# Patient Record
Sex: Female | Born: 1975
Health system: Southern US, Community
[De-identification: ages and names within clinical notes are randomized; demographics above are authoritative.]

## PROBLEM LIST (undated history)

## (undated) ENCOUNTER — Emergency Department (HOSPITAL_COMMUNITY): Admission: EM | Payer: 59 | Source: Home / Self Care

## (undated) DIAGNOSIS — F419 Anxiety disorder, unspecified: Secondary | ICD-10-CM

## (undated) DIAGNOSIS — F1910 Other psychoactive substance abuse, uncomplicated: Secondary | ICD-10-CM

## (undated) DIAGNOSIS — E119 Type 2 diabetes mellitus without complications: Secondary | ICD-10-CM

## (undated) DIAGNOSIS — M1990 Unspecified osteoarthritis, unspecified site: Secondary | ICD-10-CM

## (undated) DIAGNOSIS — K219 Gastro-esophageal reflux disease without esophagitis: Secondary | ICD-10-CM

## (undated) DIAGNOSIS — G8929 Other chronic pain: Secondary | ICD-10-CM

## (undated) DIAGNOSIS — R0600 Dyspnea, unspecified: Secondary | ICD-10-CM

## (undated) DIAGNOSIS — M545 Low back pain, unspecified: Secondary | ICD-10-CM

## (undated) DIAGNOSIS — D649 Anemia, unspecified: Secondary | ICD-10-CM

## (undated) DIAGNOSIS — J45909 Unspecified asthma, uncomplicated: Secondary | ICD-10-CM

## (undated) DIAGNOSIS — F32A Depression, unspecified: Secondary | ICD-10-CM

## (undated) DIAGNOSIS — E785 Hyperlipidemia, unspecified: Secondary | ICD-10-CM

## (undated) DIAGNOSIS — H409 Unspecified glaucoma: Secondary | ICD-10-CM

## (undated) DIAGNOSIS — G932 Benign intracranial hypertension: Secondary | ICD-10-CM

## (undated) DIAGNOSIS — F329 Major depressive disorder, single episode, unspecified: Secondary | ICD-10-CM

## (undated) DIAGNOSIS — T7840XA Allergy, unspecified, initial encounter: Secondary | ICD-10-CM

## (undated) DIAGNOSIS — I10 Essential (primary) hypertension: Secondary | ICD-10-CM

## (undated) HISTORY — DX: Unspecified glaucoma: H40.9

## (undated) HISTORY — PX: DERMOID CYST  EXCISION: SHX1452

## (undated) HISTORY — DX: Unspecified osteoarthritis, unspecified site: M19.90

## (undated) HISTORY — DX: Allergy, unspecified, initial encounter: T78.40XA

## (undated) HISTORY — DX: Other psychoactive substance abuse, uncomplicated: F19.10

## (undated) HISTORY — DX: Depression, unspecified: F32.A

## (undated) HISTORY — DX: Anxiety disorder, unspecified: F41.9

## (undated) HISTORY — DX: Gastro-esophageal reflux disease without esophagitis: K21.9

## (undated) HISTORY — DX: Unspecified asthma, uncomplicated: J45.909

---
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
## 2011-04-30 DIAGNOSIS — G8929 Other chronic pain: Secondary | ICD-10-CM | POA: Insufficient documentation

## 2011-04-30 DIAGNOSIS — F3289 Other specified depressive episodes: Secondary | ICD-10-CM | POA: Insufficient documentation

## 2011-04-30 DIAGNOSIS — B373 Candidiasis of vulva and vagina: Secondary | ICD-10-CM | POA: Insufficient documentation

## 2011-04-30 DIAGNOSIS — F329 Major depressive disorder, single episode, unspecified: Secondary | ICD-10-CM | POA: Insufficient documentation

## 2011-04-30 DIAGNOSIS — H6690 Otitis media, unspecified, unspecified ear: Secondary | ICD-10-CM | POA: Insufficient documentation

## 2011-04-30 DIAGNOSIS — B3731 Acute candidiasis of vulva and vagina: Secondary | ICD-10-CM | POA: Insufficient documentation

## 2011-04-30 DIAGNOSIS — M25511 Pain in right shoulder: Secondary | ICD-10-CM

## 2011-05-04 DIAGNOSIS — F1210 Cannabis abuse, uncomplicated: Secondary | ICD-10-CM | POA: Insufficient documentation

## 2011-05-04 DIAGNOSIS — F1410 Cocaine abuse, uncomplicated: Secondary | ICD-10-CM | POA: Insufficient documentation

## 2011-05-04 DIAGNOSIS — F1920 Other psychoactive substance dependence, uncomplicated: Secondary | ICD-10-CM | POA: Insufficient documentation

## 2011-09-20 DIAGNOSIS — F1310 Sedative, hypnotic or anxiolytic abuse, uncomplicated: Secondary | ICD-10-CM | POA: Insufficient documentation

## 2014-05-15 DIAGNOSIS — F1994 Other psychoactive substance use, unspecified with psychoactive substance-induced mood disorder: Secondary | ICD-10-CM | POA: Insufficient documentation

## 2014-05-15 DIAGNOSIS — F17210 Nicotine dependence, cigarettes, uncomplicated: Secondary | ICD-10-CM | POA: Insufficient documentation

## 2014-05-15 DIAGNOSIS — F6089 Other specific personality disorders: Secondary | ICD-10-CM | POA: Insufficient documentation

## 2015-09-13 DIAGNOSIS — F32A Depression, unspecified: Secondary | ICD-10-CM | POA: Insufficient documentation

## 2015-09-13 DIAGNOSIS — F329 Major depressive disorder, single episode, unspecified: Secondary | ICD-10-CM | POA: Insufficient documentation

## 2015-09-16 DIAGNOSIS — F1920 Other psychoactive substance dependence, uncomplicated: Secondary | ICD-10-CM | POA: Insufficient documentation

## 2016-08-26 ENCOUNTER — Emergency Department (HOSPITAL_COMMUNITY): Payer: Self-pay

## 2016-08-26 ENCOUNTER — Encounter (HOSPITAL_COMMUNITY): Payer: Self-pay | Admitting: Emergency Medicine

## 2016-08-26 ENCOUNTER — Emergency Department (HOSPITAL_COMMUNITY)
Admission: EM | Admit: 2016-08-26 | Discharge: 2016-08-26 | Disposition: A | Discharge status: Home / Self Care | Payer: Self-pay | Attending: Emergency Medicine | Admitting: Emergency Medicine

## 2016-08-26 DIAGNOSIS — N390 Urinary tract infection, site not specified: Secondary | ICD-10-CM | POA: Insufficient documentation

## 2016-08-26 DIAGNOSIS — I10 Essential (primary) hypertension: Secondary | ICD-10-CM | POA: Insufficient documentation

## 2016-08-26 DIAGNOSIS — J069 Acute upper respiratory infection, unspecified: Secondary | ICD-10-CM

## 2016-08-26 DIAGNOSIS — F17210 Nicotine dependence, cigarettes, uncomplicated: Secondary | ICD-10-CM | POA: Insufficient documentation

## 2016-08-26 HISTORY — DX: Hyperlipidemia, unspecified: E78.5

## 2016-08-26 HISTORY — DX: Other chronic pain: G89.29

## 2016-08-26 HISTORY — DX: Low back pain: M54.5

## 2016-08-26 HISTORY — DX: Essential (primary) hypertension: I10

## 2016-08-26 HISTORY — DX: Low back pain, unspecified: M54.50

## 2016-08-26 LAB — CBC
HCT: 39.7 % (ref 36.0–46.0)
Hemoglobin: 12.9 g/dL (ref 12.0–15.0)
MCH: 30.4 pg (ref 26.0–34.0)
MCHC: 32.5 g/dL (ref 30.0–36.0)
MCV: 93.6 fL (ref 78.0–100.0)
Platelets: 245 10*3/uL (ref 150–400)
RBC: 4.24 MIL/uL (ref 3.87–5.11)
RDW: 12.4 % (ref 11.5–15.5)
WBC: 10.3 10*3/uL (ref 4.0–10.5)

## 2016-08-26 LAB — BASIC METABOLIC PANEL
Anion gap: 7 (ref 5–15)
BUN: 15 mg/dL (ref 6–20)
CO2: 24 mmol/L (ref 22–32)
Calcium: 8.8 mg/dL — ABNORMAL LOW (ref 8.9–10.3)
Chloride: 109 mmol/L (ref 101–111)
Creatinine, Ser: 0.77 mg/dL (ref 0.44–1.00)
GFR calc Af Amer: >60 mL/min (ref >60)
GFR calc non Af Amer: >60 mL/min (ref >60)
Glucose, Bld: 110 mg/dL — ABNORMAL HIGH (ref 65–99)
Potassium: 3.5 mmol/L (ref 3.5–5.1)
Sodium: 140 mmol/L (ref 135–145)

## 2016-08-26 LAB — I-STAT TROPONIN, ED: Troponin i, poc: 0 ng/mL (ref 0.00–0.08)

## 2016-08-26 MED ORDER — ALBUTEROL SULFATE HFA 108 (90 BASE) MCG/ACT IN AERS
1.0000 | INHALATION_SPRAY | Freq: Once | RESPIRATORY_TRACT | Status: AC
  Administered 2016-08-26: 1 via RESPIRATORY_TRACT
  Filled 2016-08-26: qty 6.7

## 2016-08-26 MED ORDER — AMLODIPINE BESYLATE 10 MG PO TABS: 10.0000 mg | ORAL_TABLET | Freq: Every day | ORAL | 0 refills | Status: DC

## 2016-08-26 MED ORDER — FLUCONAZOLE 150 MG PO TABS
150.0000 mg | ORAL_TABLET | Freq: Once | ORAL | Status: AC
  Administered 2016-08-26: 150 mg via ORAL
  Filled 2016-08-26: qty 1

## 2016-08-26 MED ORDER — METRONIDAZOLE 500 MG PO TABS
2000.0000 mg | ORAL_TABLET | Freq: Once | ORAL | Status: AC
  Administered 2016-08-26: 2000 mg via ORAL
  Filled 2016-08-26: qty 4

## 2016-08-26 NOTE — ED Triage Notes (Signed)
As Probation officer was walking out door, pt states she wants to know if they will check her itching "down there" pt states she has had itching since Tuesday.

## 2016-08-26 NOTE — ED Triage Notes (Signed)
Delay in documenting triage related to obtaining EKG.   Pt complaint of intermittent left sided chest sharpness, SOB, and nasal congestion since Thursday. Pt denies cough.

## 2016-08-26 NOTE — ED Provider Notes (Signed)
[facility] DEPT Provider Note   CSN: [id number] Arrival date & time: 08/26/16  1450     History   Chief Complaint Chief Complaint  Patient presents with  . Chest Pain  . Shortness of Breath  . Nasal Congestion    HPI Leah Barber is a 41 y.o. female.  The history is provided by the patient. No language interpreter was used.  Chest Pain   Associated symptoms include shortness of breath.  Shortness of Breath  Associated symptoms include chest pain.    Leah Barber is a 41 y.o. female who presents to the Emergency Department complaining of cough, nasal congestion.  She presents for evaluation of 10 days of nasal congestion and drainage with associated cough. She has occasional chest discomfort with coughing and shortness of breath. No fevers, vomiting, diarrhea. She also states she she would like refills of her blood pressure medications and treatment for bacterial vaginosis. No dysuria. She recently moved to the area and does not have any of her prescription medications.  Past Medical History:  Diagnosis Date  . Chronic lower back pain   . Hyperlipemia   . Hypertension     There are no active problems to display for this patient.   History reviewed. No pertinent surgical history.  OB History    No data available       Home Medications    Prior to Admission medications   Medication Sig Start Date End Date Taking? Authorizing Provider  amLODipine (NORVASC) 10 MG tablet Take 1 tablet (10 mg total) by mouth daily. 08/26/16   Quintella Reichert, MD    Family History No family history on file.  Social History Social History  Substance Use Topics  . Smoking status: Current Every Day Smoker    Packs/day: 1.00    Types: Cigarettes  . Smokeless tobacco: Not on file  . Alcohol use No     Allergies   Patient has no known allergies.   Review of Systems Review of Systems  Respiratory: Positive for shortness of breath.   Cardiovascular: Positive for chest pain.   All other systems reviewed and are negative.    Physical Exam Updated Vital Signs BP 151/97 (BP Location: Right Arm)   Pulse 88   Temp 98.3 F (36.8 C) (Oral)   Resp 18   Ht 5\' 2"  (1.575 m)   Wt 180 lb (81.6 kg)   LMP 08/22/2016   SpO2 100%   BMI 32.92 kg/m   Physical Exam  Constitutional: She is oriented to person, place, and time. She appears well-developed and well-nourished.  HENT:  Head: Normocephalic and atraumatic.  Right Ear: External ear normal.  Left Ear: External ear normal.  Mouth/Throat: Oropharynx is clear and moist.  Cardiovascular: Normal rate and regular rhythm.   No murmur heard. Pulmonary/Chest: Effort normal and breath sounds normal. No respiratory distress.  Abdominal: Soft. There is no tenderness. There is no rebound and no guarding.  Musculoskeletal: She exhibits no edema or tenderness.  Neurological: She is alert and oriented to person, place, and time.  Skin: Skin is warm and dry.  Psychiatric: She has a normal mood and affect. Her behavior is normal.  Nursing note and vitals reviewed.    ED Treatments / Results  Labs (all labs ordered are listed, but only abnormal results are displayed) Labs Reviewed  BASIC METABOLIC PANEL - Abnormal; Notable for the following:       Result Value   Glucose, Bld 110 (*)    Calcium  8.8 (*)    All other components within normal limits  CBC  I-STAT TROPOININ, ED    EKG  EKG Interpretation  Date/Time:  Saturday August 26 2016 14:58:56 EST Ventricular Rate:  106 PR Interval:    QRS Duration: 87 QT Interval:  345 QTC Calculation: 459 R Axis:   -28 Text Interpretation:  Sinus tachycardia Borderline left axis deviation Confirmed by Hazle Coca [phone]) on 08/26/2016 6:37:46 PM       Radiology Dg Chest 2 View  Result Date: 08/26/2016 CLINICAL DATA:  Acute left chest pain for 2 days. History bronchitis, pneumonia and hypertension. Smoker. EXAM: CHEST  2 VIEW COMPARISON:  None available FINDINGS: The  heart size and mediastinal contours are within normal limits. Both lungs are clear. The visualized skeletal structures are unremarkable. External artifact overlies the right upper chest. IMPRESSION: No active cardiopulmonary disease. Electronically Signed   By: Jerilynn Mages.  Shick M.D.   On: 08/26/2016 15:27    Procedures Procedures (including critical care time)  Medications Ordered in ED Medications  metroNIDAZOLE (FLAGYL) tablet 2,000 mg (2,000 mg Oral Given 08/26/16 1927)  albuterol (PROVENTIL HFA;VENTOLIN HFA) 108 (90 Base) MCG/ACT inhaler 1 puff (1 puff Inhalation Given 08/26/16 1927)  fluconazole (DIFLUCAN) tablet 150 mg (150 mg Oral Given 08/26/16 1927)     Initial Impression / Assessment and Plan / ED Course  I have reviewed the triage vital signs and the nursing notes.  Pertinent labs & imaging results that were available during my care of the patient were reviewed by me and considered in my medical decision making (see chart for details).  Clinical Course     Patient here for nasal congestion, cough, shortness of breath for several days. She is nontoxic appearing on examination without evidence of acute bacterial infection. Presentation is not consistent with ACS, hypertensive urgency, PE, CHF, pneumonia. She is requesting refills on her blood pressure medications as well as treatment for BV. Plan to refer for outpatient follow-up. Home care and return precautions discussed for viral URI.  Final Clinical Impressions(s) / ED Diagnoses   Final diagnoses:  Viral upper respiratory tract infection  Essential hypertension    New Prescriptions Discharge Medication List as of 08/26/2016  7:22 PM    START taking these medications   Details  amLODipine (NORVASC) 10 MG tablet Take 1 tablet (10 mg total) by mouth daily., Starting Sat 08/26/2016, Print         Quintella Reichert, MD 08/27/16 1535

## 2016-08-26 NOTE — ED Triage Notes (Signed)
Pt requesting a refill on BP meds and referral to MD. States she got out of the [facility] for behavior problems and they only gave her 2 weeks supply.

## 2016-11-23 ENCOUNTER — Encounter: Payer: Self-pay | Admitting: Family Medicine

## 2016-11-23 ENCOUNTER — Ambulatory Visit (INDEPENDENT_AMBULATORY_CARE_PROVIDER_SITE_OTHER): Payer: Self-pay | Admitting: Family Medicine

## 2016-11-23 ENCOUNTER — Ambulatory Visit: Payer: Self-pay | Admitting: Family Medicine

## 2016-11-23 VITALS — BP 144/98 | HR 85 | Temp 98.6°F | Ht 62.0 in | Wt 194.0 lb

## 2016-11-23 DIAGNOSIS — Z131 Encounter for screening for diabetes mellitus: Secondary | ICD-10-CM

## 2016-11-23 DIAGNOSIS — I10 Essential (primary) hypertension: Secondary | ICD-10-CM

## 2016-11-23 DIAGNOSIS — R1030 Lower abdominal pain, unspecified: Secondary | ICD-10-CM

## 2016-11-23 DIAGNOSIS — E789 Disorder of lipoprotein metabolism, unspecified: Secondary | ICD-10-CM

## 2016-11-23 DIAGNOSIS — F1911 Other psychoactive substance abuse, in remission: Secondary | ICD-10-CM

## 2016-11-23 LAB — COMPREHENSIVE METABOLIC PANEL
ALT: 14 U/L (ref 6–29)
AST: 14 U/L (ref 10–30)
Albumin: 4.1 g/dL (ref 3.6–5.1)
Alkaline Phosphatase: 76 U/L (ref 33–115)
BUN: 14 mg/dL (ref 7–25)
CO2: 20 mmol/L (ref 20–31)
Calcium: 9.3 mg/dL (ref 8.6–10.2)
Chloride: 110 mmol/L (ref 98–110)
Creat: 0.79 mg/dL (ref 0.50–1.10)
Glucose, Bld: 106 mg/dL — ABNORMAL HIGH (ref 65–99)
Potassium: 4.2 mmol/L (ref 3.5–5.3)
Sodium: 140 mmol/L (ref 135–146)
Total Bilirubin: 0.4 mg/dL (ref 0.2–1.2)
Total Protein: 6.7 g/dL (ref 6.1–8.1)

## 2016-11-23 LAB — CBC WITH DIFFERENTIAL/PLATELET
Basophils Absolute: 96 cells/uL (ref 0–200)
Basophils Relative: 1 %
Eosinophils Absolute: 480 cells/uL (ref 15–500)
Eosinophils Relative: 5 %
HCT: 39.3 % (ref 35.0–45.0)
Hemoglobin: 13.2 g/dL (ref 11.7–15.5)
Lymphocytes Relative: 31 %
Lymphs Abs: 2976 cells/uL (ref 850–3900)
MCH: 31 pg (ref 27.0–33.0)
MCHC: 33.6 g/dL (ref 32.0–36.0)
MCV: 92.3 fL (ref 80.0–100.0)
MPV: 11 fL (ref 7.5–12.5)
Monocytes Absolute: 576 cells/uL (ref 200–950)
Monocytes Relative: 6 %
Neutro Abs: 5472 cells/uL (ref 1500–7800)
Neutrophils Relative %: 57 %
Platelets: 241 10*3/uL (ref 140–400)
RBC: 4.26 MIL/uL (ref 3.80–5.10)
RDW: 12.7 % (ref 11.0–15.0)
WBC: 9.6 10*3/uL (ref 3.8–10.8)

## 2016-11-23 LAB — POCT URINALYSIS DIP (DEVICE)
Bilirubin Urine: NEGATIVE
Glucose, UA: NEGATIVE mg/dL
Ketones, ur: NEGATIVE mg/dL
Leukocytes, UA: NEGATIVE
Nitrite: NEGATIVE
Protein, ur: 30 mg/dL — AB
Specific Gravity, Urine: 1.025 (ref 1.005–1.030)
Urobilinogen, UA: 0.2 mg/dL (ref 0.0–1.0)
pH: 5.5 (ref 5.0–8.0)

## 2016-11-23 LAB — LIPID PANEL
Cholesterol: 216 mg/dL — ABNORMAL HIGH (ref <200)
HDL: 47 mg/dL — ABNORMAL LOW (ref >50)
LDL Cholesterol: 151 mg/dL — ABNORMAL HIGH (ref <100)
Total CHOL/HDL Ratio: 4.6 Ratio (ref <5.0)
Triglycerides: 92 mg/dL (ref <150)
VLDL: 18 mg/dL (ref <30)

## 2016-11-23 LAB — POCT URINE PREGNANCY: Preg Test, Ur: NEGATIVE

## 2016-11-23 MED ORDER — AMLODIPINE BESYLATE 10 MG PO TABS: 10.0000 mg | ORAL_TABLET | Freq: Every day | ORAL | 1 refills | Status: DC

## 2016-11-23 MED ORDER — OMEPRAZOLE 20 MG PO CPDR: 20.0000 mg | DELAYED_RELEASE_CAPSULE | Freq: Every day | ORAL | 3 refills | Status: DC

## 2016-11-23 MED FILL — AMLODIPINE BESYLATE 10 MG T: 10 | 90 days supply | Qty: 90 | Fill #0

## 2016-11-23 MED FILL — ?OMEPRAZOLE DR 20 MG CAPSUL: 20 | 30 days supply | Qty: 30 | Fill #0

## 2016-11-23 NOTE — Progress Notes (Signed)
Patient ID: Leah Barber, female    DOB: 07-23-1976, 41 y.o.   MRN: [id number]  PCP: Molli Barrows, FNP  Chief Complaint  Patient presents with  . Establish Care  . Abdominal Pain  . Hypertension    Subjective:  HPI Leah Barber is a 42 y.o. female presents to establish care, hypertension,  and abdominal pain.  Chronic problems: Includes polysubstance abuse, hypertension, bipolar disorder, cocaine abuse, personality disorder, benzodiazepine abuse, and chronic shoulder pain.  She has lived in different cities and been seen medically at [facility].  Today she is accompanied by her daughter to whom she resides with. She is currently uninsured as she recently relocated from [place] and her Medicaid did not transfer.  She is in remission from substance use since December. She followed by [facility] and is being seen weekly by psychiatrist Dr. Loma Sousa. She is receiving medication management through [facility] for mental health needs and she is prescribed Cymbalta and Gabapentin.    Hypertension  Diagnosed with HTN 2000. She recalls being prescribed another medication initially for blood pressure control and later switched to Norvasc.  Was told that she had elevated cholesterol. No routine monitoring of blood pressure, although has been out of medication for a period of time. She denies chest pain, headache, dizziness, or shortness of breath.  Screening for Diabetes Mother and grandmother both have diabetes. She reports previously being prescribed  Metformin for a diagnosis of prediabetes although she hasn't taken medication for a long period of time. Reports increased frequency of urination.  Abdominal Pain Reports that she feels pelvic and abdominal pain is related to tumors on ovaries and fibroids. She can't recall when exactly she was diagnosed with fibroids although she recalls the diagnosis was made in the mid 2000's. She recalls being told that she had multiple  fibroids with largest measuring she thinks 3 cm. Denies any recent PAP or pelvic exam.     Social History   Social History  . Marital status: Legally Separated    Spouse name: N/A  . Number of children: N/A  . Years of education: N/A   Occupational History  . Not on file.   Social History Main Topics  . Smoking status: Current Every Day Smoker    Packs/day: 1.00    Types: Cigarettes  . Smokeless tobacco: Never Used  . Alcohol use No  . Drug use: No  . Sexual activity: Not on file   Other Topics Concern  . Not on file   Social History Narrative  . No narrative on file   Review of Systems See HPI Patient Active Problem List   Diagnosis Date Noted  . Polysubstance dependence including opioid type drug with complication, episodic abuse, with unspecified complication ([place]) 51/88/4166  . Depression 09/13/2015  . Cluster B personality disorder 05/15/2014  . Dependence on nicotine from cigarettes 05/15/2014  . Substance induced mood disorder ([place]) 05/15/2014  . Benzodiazepine abuse 09/20/2011  . Cannabis abuse 05/04/2011  . Cocaine abuse 05/04/2011  . Polysubstance dependence ([place]) 05/04/2011  . Candidiasis of vagina 04/30/2011  . Chronic pain in right shoulder 04/30/2011  . Depressive disorder, not elsewhere classified 04/30/2011  . Otitis media, acute 04/30/2011    No Known Allergies  Prior to Admission medications   Medication Sig Start Date End Date Taking? Authorizing Provider  amLODipine (NORVASC) 10 MG tablet Take 1 tablet (10 mg total) by mouth daily. Patient not taking: Reported on 11/23/2016 08/26/16   Quintella Reichert, MD  Past Medical, Surgical Family and Social History reviewed and updated.    Objective:   Today's Vitals   11/23/16 0954  BP: (!) 144/98  Pulse: 85  Temp: 98.6 F (37 C)  TempSrc: Oral  SpO2: 96%  Weight: 194 lb (88 kg)  Height: 5\' 2"  (1.575 m)    Wt Readings from Last 3 Encounters:  11/23/16 194 lb (88 kg)  08/26/16 180 lb  (81.6 kg)   Physical Exam  Constitutional: She is oriented to person, place, and time. She appears well-developed and well-nourished.  HENT:  Head: Normocephalic and atraumatic.  Eyes: Conjunctivae are normal. Pupils are equal, round, and reactive to light.  Neck: Normal range of motion. Neck supple.  Cardiovascular: Normal rate, regular rhythm, normal heart sounds and intact distal pulses.   Pulmonary/Chest: Effort normal and breath sounds normal.  Abdominal: Soft. Normal appearance and bowel sounds are normal. There is tenderness in the suprapubic area.    Musculoskeletal: Normal range of motion.  Neurological: She is alert and oriented to person, place, and time.  Skin: Skin is warm and dry.  Psychiatric: Judgment and thought content normal. Her mood appears anxious. Her speech is rapid and/or pressured. She is hyperactive. Cognition and memory are normal.    Assessment & Plan:  1. Essential hypertension -Continue Amlodipine   2. Substance abuse in remission - HIV antibody - Hepatitis C antibody  3. Lower abdominal pain - CBC with Differential -schedule PAP and Pelvic Exam within 2 weeks.   4. Abnormal cholesterol test - Lipid panel; Future  5. Screen for Diabetes -A1C-5.1%  -Schedule a gynecological visit with me to perform a PAP and discuss options for imaging to evaluate fibroids -in the meantime , apply for [place] patient assistance, follow-up with Medicaid, and [place]. Kenton Kingfisher, MSN, [facility] [address] [place], [place] [address] [phone]

## 2016-11-23 NOTE — Patient Instructions (Addendum)
[facility] is located at [address] [place], [place]   I have refilled your Amlodipine (Norvasc) and sent a prescription for Omeprazole 20 mg dally for acid reflux symptoms.  I am ordering a ultrasound and referring you to gynecology for further evaluation of uterine fibroids.  Call the breast center to schedule your mammogram at [phone].   Please complete application for the [facility] and [place] Patient assistance program.    Preventing Cerebrovascular Disease Arteries are blood vessels that carry blood that contains oxygen from the heart to all parts of the body. Cerebrovascular disease affects arteries that supply the brain. Any condition that blocks or disrupts blood flow to the brain can cause cerebrovascular disease. Brain cells that lose blood supply start to die within minutes (stroke). Stroke is the main danger of cerebrovascular disease. Atherosclerosis and high blood pressure are common causes of cerebrovascular disease. Atherosclerosis is narrowing and hardening of an artery that results when fat, cholesterol, calcium, or other substances (plaque) build up inside an artery. Plaque reduces blood flow through the artery. High blood pressure increases the risk of bleeding inside the brain. Making diet and lifestyle changes to prevent atherosclerosis and high blood pressure lowers your risk of cerebrovascular disease. What nutrition changes can be made?  Eat more fruits, vegetables, and whole grains.  Reduce how much saturated fat you eat. To do this, eat less red meat and fewer full-fat dairy products.  Eat healthy proteins instead of red meat. Healthy proteins include:  Fish. Eat fish that contains heart-healthy omega-3 fatty acids, twice a week. Examples include salmon, albacore tuna, mackerel, and herring.  Chicken.  Nuts.  Low-fat or nonfat yogurt.  Avoid processed meats, like bacon and lunchmeat.  Avoid foods that  contain:  A lot of sugar, such as sweets and drinks with added sugar.  A lot of salt (sodium). Avoid adding extra salt to your food, as told by your health care provider.  Trans fats, such as margarine and baked goods. Trans fats may be listed as "partially hydrogenated oils" on food labels.  Check food labels to see how much sodium, sugar, and trans fats are in foods.  Use vegetable oils that contain low amounts of saturated fat, such as olive oil or canola oil. What lifestyle changes can be made?  Drink alcohol in moderation. This means no more than 1 drink a day for nonpregnant women and 2 drinks a day for men. One drink equals 12 oz of beer, 5 oz of wine, or 1 oz of hard liquor.  If you are overweight, ask your health care provider to recommend a weight-loss plan for you. Losing 5-10 lb (2.2-4.5 kg) can reduce your risk of diabetes, atherosclerosis, and high blood pressure.  Exercise for 30?60 minutes on most days, or as much as told by your health care provider.  Do moderate-intensity exercise, such as brisk walking, bicycling, and water aerobics. Ask your health care provider which activities are safe for you.  Do not use any products that contain nicotine or tobacco, such as cigarettes and e-cigarettes. If you need help quitting, ask your health care provider. Why are these changes important? Making these changes lowers your risk of many diseases that can cause cerebrovascular disease and stroke. Stroke is a leading cause of death and disability. Making these changes also improves your overall health and quality of life. What can I do to lower my risk? The following factors make you more likely to develop cerebrovascular disease:  Being overweight.  Smoking.  Being physically inactive.  Eating a high-fat diet.  Having certain health conditions, such as:  Diabetes.  High blood pressure.  Heart disease.  Atherosclerosis.  High cholesterol.  Sickle cell  disease. Talk with your health care provider about your risk for cerebrovascular disease. Work with your health care provider to control diseases that you have that may contribute to cerebrovascular disease. Your health care provider may prescribe medicines to help prevent major causes of cerebrovascular disease. Where to find more information: Learn more about preventing cerebrovascular disease from:  [place], Lung, and [place]: MoAnalyst.de  Centers for Disease Control and Prevention: http://www.curry-wood.biz/ Summary  Cerebrovascular disease can lead to a stroke.  Atherosclerosis and high blood pressure are major causes of cerebrovascular disease.  Making diet and lifestyle changes can reduce your risk of cerebrovascular disease.  Work with your health care provider to get your risk factors under control to reduce your risk of cerebrovascular disease. This information is not intended to replace advice given to you by your health care provider. Make sure you discuss any questions you have with your health care provider. Document Released: 08/22/2015 Document Revised: 02/25/2016 Document Reviewed: 08/22/2015 Elsevier Interactive Patient Education  2017 [place] DASH stands for "Dietary Approaches to Stop Hypertension." The DASH eating plan is a healthy eating plan that has been shown to reduce high blood pressure (hypertension). It may also reduce your risk for type 2 diabetes, heart disease, and stroke. The DASH eating plan may also help with weight loss.   What are tips for following this plan? General guidelines   Avoid eating more than 2,300 mg (milligrams) of salt (sodium) a day. If you have hypertension, you may need to reduce your sodium intake to 1,500 mg a day.  Limit alcohol intake to no more than 1 drink a day for nonpregnant women and 2 drinks a day for men. One drink equals 12 oz of beer, 5 oz of  wine, or 1 oz of hard liquor.  Work with your health care provider to maintain a healthy body weight or to lose weight. Ask what an ideal weight is for you.  Get at least 30 minutes of exercise that causes your heart to beat faster (aerobic exercise) most days of the week. Activities may include walking, swimming, or biking.  Work with your health care provider or diet and nutrition specialist (dietitian) to adjust your eating plan to your individual calorie needs.  Reading food labels   Check food labels for the amount of sodium per serving. Choose foods with less than 5 percent of the Daily Value of sodium. Generally, foods with less than 300 mg of sodium per serving fit into this eating plan.  To find whole grains, look for the word "whole" as the first word in the ingredient list. Shopping   Buy products labeled as "low-sodium" or "no salt added."  Buy fresh foods. Avoid canned foods and premade or frozen meals. Cooking   Avoid adding salt when cooking. Use salt-free seasonings or herbs instead of table salt or sea salt. Check with your health care provider or pharmacist before using salt substitutes.  Do not fry foods. Cook foods using healthy methods such as baking, boiling, grilling, and broiling instead.  Cook with heart-healthy oils, such as olive, canola, soybean, or sunflower oil. Meal planning    Eat a balanced diet that includes:  5 or more servings of fruits and vegetables each day. At  each meal, try to fill half of your plate with fruits and vegetables.  Up to 6-8 servings of whole grains each day.  Less than 6 oz of lean meat, poultry, or fish each day. A 3-oz serving of meat is about the same size as a deck of cards. One egg equals 1 oz.  2 servings of low-fat dairy each day.  A serving of nuts, seeds, or beans 5 times each week.  Heart-healthy fats. Healthy fats called Omega-3 fatty acids are found in foods such as flaxseeds and coldwater fish, like  sardines, salmon, and mackerel.  Limit how much you eat of the following:  Canned or prepackaged foods.  Food that is high in trans fat, such as fried foods.  Food that is high in saturated fat, such as fatty meat.  Sweets, desserts, sugary drinks, and other foods with added sugar.  Full-fat dairy products.     Do not salt foods before eating.  Try to eat at least 2 vegetarian meals each week.  Eat more home-cooked food and less restaurant, buffet, and fast food.  When eating at a restaurant, ask that your food be prepared with less salt or no salt, if possible.  What foods are recommended? The items listed may not be a complete list. Talk with your dietitian about what dietary choices are best for you. Grains  Whole-grain or whole-wheat bread. Whole-grain or whole-wheat pasta. Vassel rice. [place]. Bulgur. Whole-grain and low-sodium cereals. Pita bread. Low-fat, low-sodium crackers. Whole-wheat flour tortillas. Vegetables  Fresh or frozen vegetables (raw, steamed, roasted, or grilled). Low-sodium or reduced-sodium tomato and vegetable juice. Low-sodium or reduced-sodium tomato sauce and tomato paste. Low-sodium or reduced-sodium canned vegetables. Fruits  All fresh, dried, or frozen fruit. Canned fruit in natural juice (without added sugar). Meat and other protein foods  Skinless chicken or [place]. Ground chicken or [place]. Pork with fat trimmed off. Fish and seafood. Egg whites. Dried beans, peas, or lentils. Unsalted nuts, nut butters, and seeds. Unsalted canned beans. Lean cuts of beef with fat trimmed off. Low-sodium, lean deli meat. Dairy  Low-fat (1%) or fat-free (skim) milk. Fat-free, low-fat, or reduced-fat cheeses. Nonfat, low-sodium ricotta or cottage cheese. Low-fat or nonfat yogurt. Low-fat, low-sodium cheese. Fats and oils  Soft margarine without trans fats. Vegetable oil. Low-fat, reduced-fat, or light mayonnaise and salad dressings (reduced-sodium). Canola,  safflower, olive, soybean, and sunflower oils. Avocado. Seasoning and other foods  Herbs. Spices. Seasoning mixes without salt. Unsalted popcorn and pretzels. Fat-free sweets. What foods are not recommended? The items listed may not be a complete list. Talk with your dietitian about what dietary choices are best for you. Grains  Baked goods made with fat, such as croissants, muffins, or some breads. Dry pasta or rice meal packs. Vegetables  Creamed or fried vegetables. Vegetables in a cheese sauce. Regular canned vegetables (not low-sodium or reduced-sodium). Regular canned tomato sauce and paste (not low-sodium or reduced-sodium). Regular tomato and vegetable juice (not low-sodium or reduced-sodium). Angie Fava. Olives. Fruits  Canned fruit in a light or heavy syrup. Fried fruit. Fruit in cream or butter sauce.  Meat and other protein foods  Fatty cuts of meat. Ribs. Fried meat. Berniece Salines. Sausage. Bologna and other processed lunch meats. Salami. Fatback. Hotdogs. Bratwurst. Salted nuts and seeds. Canned beans with added salt. Canned or smoked fish. Whole eggs or egg yolks. Chicken or [place] with skin. Dairy  Whole or 2% milk, cream, and half-and-half. Whole or full-fat cream cheese. Whole-fat or sweetened yogurt. Full-fat  cheese. Nondairy creamers. Whipped toppings. Processed cheese and cheese spreads.  Fats and oils  Butter. Stick margarine. Lard. Shortening. Ghee. Bacon fat. Tropical oils, such as coconut, palm kernel, or palm oil.  Seasoning and other foods  Salted popcorn and pretzels. Onion salt, garlic salt, seasoned salt, table salt, and sea salt. Worcestershire sauce. Tartar sauce. Barbecue sauce. Teriyaki sauce. Soy sauce, including reduced-sodium. Steak sauce. Canned and packaged gravies. Fish sauce. Oyster sauce. Cocktail sauce. Horseradish that you find on the shelf. Ketchup. Mustard. Meat flavorings and tenderizers. Bouillon cubes. Hot sauce and Tabasco sauce. Premade or packaged  marinades. Premade or packaged taco seasonings. Relishes. Regular salad dressings.  Where to find more information:  National Heart, Lung, and [place]: https://wilson-eaton.com/  American Heart Association: www.heart.org   Summary  The DASH eating plan is a healthy eating plan that has been shown to reduce high blood pressure (hypertension). It may also reduce your risk for type 2 diabetes, heart disease, and stroke.  With the DASH eating plan, you should limit salt (sodium) intake to 2,300 mg a day. If you have hypertension, you may need to reduce your sodium intake to 1,500 mg a day.  When on the DASH eating plan, aim to eat more fresh fruits and vegetables, whole grains, lean proteins, low-fat dairy, and heart-healthy fats.  Work with your health care provider or diet and nutrition specialist (dietitian) to adjust your eating plan to your individual calorie needs. This information is not intended to replace advice given to you by your health care provider. Make sure you discuss any questions you have with your health care provider. Document Released: 07/27/2011 Document Revised: 07/31/2016 Document Reviewed: 07/31/2016 Elsevier Interactive Patient Education  2017 [employer].

## 2016-11-24 LAB — THYROID PANEL WITH TSH
Free Thyroxine Index: 2 (ref 1.4–3.8)
T3 Uptake: 29 % (ref 22–35)
T4, Total: 6.8 ug/dL (ref 4.5–12.0)
TSH: 1.1 mIU/L

## 2016-11-24 LAB — HEPATITIS C ANTIBODY: HCV Ab: NEGATIVE

## 2016-11-24 LAB — HEMOGLOBIN A1C
Hgb A1c MFr Bld: 5.1 % (ref <5.7)
Mean Plasma Glucose: 100 mg/dL

## 2016-11-24 LAB — HIV ANTIBODY (ROUTINE TESTING W REFLEX): HIV 1&2 Ab, 4th Generation: NONREACTIVE

## 2016-11-30 ENCOUNTER — Ambulatory Visit: Payer: Self-pay | Admitting: Family Medicine

## 2016-12-10 ENCOUNTER — Encounter (HOSPITAL_COMMUNITY): Payer: Self-pay | Admitting: *Deleted

## 2016-12-10 ENCOUNTER — Emergency Department (HOSPITAL_COMMUNITY): Payer: Self-pay

## 2016-12-10 ENCOUNTER — Emergency Department (HOSPITAL_COMMUNITY)
Admission: EM | Admit: 2016-12-10 | Discharge: 2016-12-11 | Disposition: A | Discharge status: Home / Self Care | Payer: Self-pay | Attending: Emergency Medicine | Admitting: Emergency Medicine

## 2016-12-10 DIAGNOSIS — F17210 Nicotine dependence, cigarettes, uncomplicated: Secondary | ICD-10-CM | POA: Insufficient documentation

## 2016-12-10 DIAGNOSIS — J42 Unspecified chronic bronchitis: Secondary | ICD-10-CM | POA: Insufficient documentation

## 2016-12-10 DIAGNOSIS — J069 Acute upper respiratory infection, unspecified: Secondary | ICD-10-CM | POA: Insufficient documentation

## 2016-12-10 DIAGNOSIS — B9789 Other viral agents as the cause of diseases classified elsewhere: Secondary | ICD-10-CM

## 2016-12-10 DIAGNOSIS — Z79899 Other long term (current) drug therapy: Secondary | ICD-10-CM | POA: Insufficient documentation

## 2016-12-10 DIAGNOSIS — H66001 Acute suppurative otitis media without spontaneous rupture of ear drum, right ear: Secondary | ICD-10-CM

## 2016-12-10 DIAGNOSIS — I10 Essential (primary) hypertension: Secondary | ICD-10-CM | POA: Insufficient documentation

## 2016-12-10 DIAGNOSIS — H66004 Acute suppurative otitis media without spontaneous rupture of ear drum, recurrent, right ear: Secondary | ICD-10-CM | POA: Insufficient documentation

## 2016-12-10 NOTE — ED Provider Notes (Signed)
[facility] DEPT Provider Note   CSN: [id number] Arrival date & time: 12/10/16  1931   By signing my name below, I, Eunice Blase, attest that this documentation has been prepared under the direction and in the presence of [facility], PA-C. Electronically signed, Eunice Blase, ED Scribe. 12/11/16. 12:37 AM.   History   Chief Complaint Chief Complaint  Patient presents with  . Shortness of Breath   The history is provided by the patient and medical records. No language interpreter was used.    Leah Barber is a 41 y.o. female with h/o HTN, HLD, chronic bronchitis and tobacco use, transported by a friend to the Emergency Department with concern for new onset, unresolved "muffled" hearing in the right ear x 1 week. Associated cough and SOB worse when laying down, nasal congestion, rhinorrhea.  No relief noted with OTC interventions, including coricidin and alka seltzer. She adds she takes ibuprofen and tylenol regularly for chronic pain at home. Pt reportedly out of albuterol at home x 1 week. She states she smokes a pack a day. No h/o DM or any other complaints noted at this time.  Pt denies hospitalization or intubation for her chronic bronchitis, but reports she has needed to take steroids.    Past Medical History:  Diagnosis Date  . Chronic lower back pain   . Hyperlipemia   . Hypertension     Patient Active Problem List   Diagnosis Date Noted  . Polysubstance dependence including opioid type drug with complication, episodic abuse, with unspecified complication ([place]) 70/17/7939  . Depression 09/13/2015  . Cluster B personality disorder 05/15/2014  . Dependence on nicotine from cigarettes 05/15/2014  . Substance induced mood disorder ([place]) 05/15/2014  . Benzodiazepine abuse 09/20/2011  . Cannabis abuse 05/04/2011  . Cocaine abuse 05/04/2011  . Polysubstance dependence ([place]) 05/04/2011  . Candidiasis of vagina 04/30/2011  . Chronic pain in right shoulder  04/30/2011  . Depressive disorder, not elsewhere classified 04/30/2011  . Otitis media, acute 04/30/2011    History reviewed. No pertinent surgical history.  OB History    No data available       Home Medications    Prior to Admission medications   Medication Sig Start Date End Date Taking? Authorizing Provider  amLODipine (NORVASC) 10 MG tablet Take 1 tablet (10 mg total) by mouth daily. 11/23/16  Yes Sedalia Muta, FNP  DULoxetine (CYMBALTA) 60 MG capsule Take 60 mg by mouth 2 (two) times daily.   Yes Historical Provider, MD  gabapentin (NEURONTIN) 400 MG capsule Take 800 mg by mouth 3 (three) times daily.   Yes Historical Provider, MD  ibuprofen (ADVIL,MOTRIN) 200 MG tablet Take 400 mg by mouth every 6 (six) hours as needed for headache, mild pain or moderate pain.   Yes Historical Provider, MD  lamoTRIgine (LAMICTAL) 25 MG tablet Take 25 mg by mouth daily.   Yes Historical Provider, MD  omeprazole (PRILOSEC) 20 MG capsule Take 1 capsule (20 mg total) by mouth daily. 11/23/16  Yes Sedalia Muta, FNP  albuterol (PROVENTIL HFA;VENTOLIN HFA) 108 (90 Base) MCG/ACT inhaler Inhale 2 puffs into the lungs every 4 (four) hours as needed for wheezing or shortness of breath. 12/11/16   Staphany Ditton, PA-C  amoxicillin (AMOXIL) 500 MG capsule Take 1 capsule (500 mg total) by mouth 3 (three) times daily. 12/11/16   Dontrez Pettis, PA-C  fluticasone (FLONASE) 50 MCG/ACT nasal spray Place 2 sprays into both nostrils daily. 12/11/16   Evelia Waskey, PA-C  predniSONE (  DELTASONE) 20 MG tablet Take 2 tablets (40 mg total) by mouth daily. 12/11/16   Jarrett Soho Sheehan Stacey, PA-C  Spacer/Aero-Holding Chambers (AEROCHAMBER PLUS WITH MASK) inhaler Use as instructed 12/11/16   Abigail Butts, PA-C    Family History No family history on file.  Social History Social History  Substance Use Topics  . Smoking status: Current Every Day Smoker    Packs/day: 1.00    Types:  Cigarettes  . Smokeless tobacco: Never Used  . Alcohol use No     Allergies   Hydrocodone and Morphine   Review of Systems Review of Systems  Constitutional: Negative for chills and fever.  HENT: Positive for congestion, hearing loss, rhinorrhea, sinus pain and sinus pressure. Negative for sore throat.   Respiratory: Positive for cough and shortness of breath.   Cardiovascular: Negative for chest pain and leg swelling.  Gastrointestinal: Negative for abdominal pain, nausea and vomiting.  Genitourinary: Negative for dysuria.  Skin: Negative for rash.  All other systems reviewed and are negative.    Physical Exam Updated Vital Signs BP (!) 142/100 (BP Location: Left Arm)   Pulse 93   Temp 99.7 F (37.6 C) (Oral)   Resp 16   LMP 10/21/2016 (Approximate) Comment: pt has fibroids and only one ovary  SpO2 95%   Physical Exam  Constitutional: She appears well-developed and well-nourished. No distress.  Awake, alert, nontoxic appearance  HENT:  Head: Normocephalic and atraumatic.  Right Ear: External ear and ear canal normal. Tympanic membrane is erythematous and bulging. Tympanic membrane is not perforated. A middle ear effusion is present.  Left Ear: Tympanic membrane, external ear and ear canal normal. Tympanic membrane is not perforated, not erythematous and not bulging.  No middle ear effusion.  Nose: Mucosal edema and rhinorrhea present. No epistaxis. Right sinus exhibits no maxillary sinus tenderness and no frontal sinus tenderness. Left sinus exhibits no maxillary sinus tenderness and no frontal sinus tenderness.  Mouth/Throat: Uvula is midline, oropharynx is clear and moist and mucous membranes are normal. Mucous membranes are not pale and not cyanotic. No oropharyngeal exudate, posterior oropharyngeal edema, posterior oropharyngeal erythema or tonsillar abscesses.  Eyes: Conjunctivae are normal. Pupils are equal, round, and reactive to light. No scleral icterus.  Neck:  Normal range of motion and full passive range of motion without pain. Neck supple.  Cardiovascular: Normal rate, regular rhythm and intact distal pulses.   Pulmonary/Chest: Effort normal. No accessory muscle usage or stridor. No tachypnea. No respiratory distress. She has decreased breath sounds (throughout).  No focal breath sounds  Abdominal: Soft. Bowel sounds are normal. She exhibits no mass. There is no tenderness. There is no rebound and no guarding.  Musculoskeletal: Normal range of motion. She exhibits no edema.  Lymphadenopathy:    She has no cervical adenopathy.  Neurological: She is alert.  Speech is clear and goal oriented Moves extremities without ataxia  Skin: Skin is warm and dry. No rash noted. She is not diaphoretic.  Psychiatric: She has a normal mood and affect.  Nursing note and vitals reviewed.    ED Treatments / Results  DIAGNOSTIC STUDIES: Oxygen Saturation is 95% on RA, NL by my interpretation.    COORDINATION OF CARE: 12:33 AM-Discussed next steps with pt. Pt verbalized understanding and is agreeable with the plan. Will order medications and prepare pt for d/c. Pt prepared for d/c, advised of symptomatic care at home and return precautions.   Labs (all labs ordered are listed, but only abnormal results are displayed) Labs  Reviewed - No data to display  EKG  EKG Interpretation  Date/Time:  Sunday December 10 2016 19:46:57 EDT Ventricular Rate:  100 PR Interval:    QRS Duration: 89 QT Interval:  355 QTC Calculation: 458 R Axis:   -45 Text Interpretation:  Sinus tachycardia Abnormal R-wave progression, early transition Inferior infarct, old No old tracing to compare Confirmed by WARD,  DO, KRISTEN ([id number]) on 12/11/2016 1:02:34 AM       Radiology Dg Chest 2 View  Result Date: 12/10/2016 CLINICAL DATA:  41 year old female with history of difficulty hearing in the right year. Cough, congestion shortness of breath. EXAM: CHEST  2 VIEW COMPARISON:  Chest  x-ray 08/26/2016. FINDINGS: Lung volumes are normal. No consolidative airspace disease. No pleural effusions. No pneumothorax. No pulmonary nodule or mass noted. Pulmonary vasculature and the cardiomediastinal silhouette are within normal limits. IMPRESSION: No radiographic evidence of acute cardiopulmonary disease. Electronically Signed   By: Vinnie Langton M.D.   On: 12/10/2016 20:16    Procedures Procedures (including critical care time)  Medications Ordered in ED Medications  albuterol (PROVENTIL) (2.5 MG/3ML) 0.083% nebulizer solution 5 mg (5 mg Nebulization Given 12/11/16 0128)  ipratropium (ATROVENT) nebulizer solution 0.5 mg (0.5 mg Nebulization Given 12/11/16 0128)  predniSONE (DELTASONE) tablet 60 mg (60 mg Oral Given 12/11/16 0042)     Initial Impression / Assessment and Plan / ED Course  I have reviewed the triage vital signs and the nursing notes.  Pertinent labs & imaging results that were available during my care of the patient were reviewed by me and considered in my medical decision making (see chart for details).     Patient presents with otalgia and exam consistent with acute otitis media. No concern for acute mastoiditis, meningitis. No recent abx usage and no abx allergies.  Will give amoxicillin. CXR without evidence of PNA.  Pt given albuterol and Atrovent.  She has hx of chronic bronchitis.  Will Rx refill of albuterol and give steroid burst.  Pt without CP. I have also discussed reasons to return immediately to the ER.  Pt expresses understanding and agrees with plan.     Final Clinical Impressions(s) / ED Diagnoses   Final diagnoses:  Chronic bronchitis, unspecified chronic bronchitis type ([place])  Acute suppurative otitis media of right ear without spontaneous rupture of tympanic membrane, recurrence not specified  Viral URI with cough    New Prescriptions Discharge Medication List as of 12/11/2016  1:20 AM    START taking these medications   Details    albuterol (PROVENTIL HFA;VENTOLIN HFA) 108 (90 Base) MCG/ACT inhaler Inhale 2 puffs into the lungs every 4 (four) hours as needed for wheezing or shortness of breath., Starting Mon 12/11/2016, Print    amoxicillin (AMOXIL) 500 MG capsule Take 1 capsule (500 mg total) by mouth 3 (three) times daily., Starting Mon 12/11/2016, Print    fluticasone (FLONASE) 50 MCG/ACT nasal spray Place 2 sprays into both nostrils daily., Starting Mon 12/11/2016, Print    predniSONE (DELTASONE) 20 MG tablet Take 2 tablets (40 mg total) by mouth daily., Starting Mon 12/11/2016, Print    Spacer/Aero-Holding Chambers (AEROCHAMBER PLUS WITH MASK) inhaler Use as instructed, Print        I personally performed the services described in this documentation, which was scribed in my presence. The recorded information has been reviewed and is accurate.    Jarrett Soho Keion Neels, PA-C 12/11/16 [id number]    Tanna Furry, MD 12/23/16 [phone]

## 2016-12-10 NOTE — ED Triage Notes (Signed)
PT states she is having difficulty hearing out the right ear, sound "muffled" for about 1 week, has been using home remedies without relief. Also states cough and shortness of breath, out of inhaler at home. Lungs clear, no distress.

## 2016-12-11 MED ORDER — IPRATROPIUM BROMIDE 0.02 % IN SOLN
0.5000 mg | Freq: Once | RESPIRATORY_TRACT | Status: AC
  Administered 2016-12-11: 0.5 mg via RESPIRATORY_TRACT
  Filled 2016-12-11: qty 2.5

## 2016-12-11 MED ORDER — PREDNISONE 20 MG PO TABS
60.0000 mg | ORAL_TABLET | Freq: Once | ORAL | Status: AC
  Administered 2016-12-11: 60 mg via ORAL
  Filled 2016-12-11: qty 3

## 2016-12-11 MED ORDER — AMOXICILLIN 500 MG PO CAPS: 500.0000 mg | ORAL_CAPSULE | Freq: Three times a day (TID) | ORAL | 0 refills | Status: DC

## 2016-12-11 MED ORDER — AEROCHAMBER PLUS W/MASK MISC: 2 refills | Status: DC

## 2016-12-11 MED ORDER — ALBUTEROL SULFATE (2.5 MG/3ML) 0.083% IN NEBU
5.0000 mg | INHALATION_SOLUTION | Freq: Once | RESPIRATORY_TRACT | Status: AC
  Administered 2016-12-11: 5 mg via RESPIRATORY_TRACT
  Filled 2016-12-11: qty 6

## 2016-12-11 MED ORDER — PREDNISONE 20 MG PO TABS: 40.0000 mg | ORAL_TABLET | Freq: Every day | ORAL | 0 refills | Status: DC

## 2016-12-11 MED ORDER — FLUTICASONE PROPIONATE 50 MCG/ACT NA SUSP: 2.0000 | Freq: Every day | NASAL | 2 refills | Status: DC

## 2016-12-11 MED ORDER — ALBUTEROL SULFATE HFA 108 (90 BASE) MCG/ACT IN AERS: 2.0000 | INHALATION_SPRAY | RESPIRATORY_TRACT | 3 refills | Status: DC | PRN

## 2016-12-11 NOTE — Discharge Instructions (Signed)
1. Medications: albuterol, prednisone, flonase, amoxicillin, usual home medications 2. Treatment: rest, drink plenty of fluids,  3. Follow Up: Please followup with your primary doctor in 2-3 days for discussion of your diagnoses and further evaluation after today's visit; if you do not have a primary care doctor use the resource guide provided to find one; Please return to the ER for worsening shortness of breath, development of chest pain, high fevers or other concerns.

## 2016-12-11 NOTE — ED Notes (Signed)
Respiratory states they will administer breathing treatments.

## 2016-12-18 ENCOUNTER — Ambulatory Visit: Payer: Self-pay | Admitting: Family Medicine

## 2016-12-29 ENCOUNTER — Encounter (HOSPITAL_COMMUNITY): Payer: Self-pay | Admitting: Emergency Medicine

## 2016-12-29 ENCOUNTER — Emergency Department (HOSPITAL_COMMUNITY): Payer: Self-pay

## 2016-12-29 ENCOUNTER — Emergency Department (HOSPITAL_COMMUNITY)
Admission: EM | Admit: 2016-12-29 | Discharge: 2016-12-29 | Disposition: A | Discharge status: Home / Self Care | Payer: Self-pay | Attending: Emergency Medicine | Admitting: Emergency Medicine

## 2016-12-29 DIAGNOSIS — R05 Cough: Secondary | ICD-10-CM | POA: Insufficient documentation

## 2016-12-29 DIAGNOSIS — Z5321 Procedure and treatment not carried out due to patient leaving prior to being seen by health care provider: Secondary | ICD-10-CM | POA: Insufficient documentation

## 2016-12-29 DIAGNOSIS — R079 Chest pain, unspecified: Secondary | ICD-10-CM | POA: Insufficient documentation

## 2016-12-29 LAB — BASIC METABOLIC PANEL
Anion gap: 6 (ref 5–15)
BUN: 14 mg/dL (ref 6–20)
CO2: 27 mmol/L (ref 22–32)
Calcium: 8.9 mg/dL (ref 8.9–10.3)
Chloride: 106 mmol/L (ref 101–111)
Creatinine, Ser: 0.92 mg/dL (ref 0.44–1.00)
GFR calc Af Amer: >60 mL/min (ref >60)
GFR calc non Af Amer: >60 mL/min (ref >60)
Glucose, Bld: 140 mg/dL — ABNORMAL HIGH (ref 65–99)
Potassium: 3.5 mmol/L (ref 3.5–5.1)
Sodium: 139 mmol/L (ref 135–145)

## 2016-12-29 LAB — CBC
HCT: 42.3 % (ref 36.0–46.0)
Hemoglobin: 14.1 g/dL (ref 12.0–15.0)
MCH: 30.9 pg (ref 26.0–34.0)
MCHC: 33.3 g/dL (ref 30.0–36.0)
MCV: 92.6 fL (ref 78.0–100.0)
Platelets: 250 10*3/uL (ref 150–400)
RBC: 4.57 MIL/uL (ref 3.87–5.11)
RDW: 12.4 % (ref 11.5–15.5)
WBC: 8.6 10*3/uL (ref 4.0–10.5)

## 2016-12-29 LAB — I-STAT TROPONIN, ED: Troponin i, poc: 0 ng/mL (ref 0.00–0.08)

## 2016-12-29 NOTE — ED Triage Notes (Addendum)
Pt reports productive cough and chest congestion for the past month. Was recently treated for an ear infection, but reports the abx did not help. Pt adds that she is having CP and SOB. Speaking in full sentences.

## 2016-12-29 NOTE — ED Notes (Signed)
Pt stated after EKG "I only said chest pain so I could be seen faster."

## 2016-12-29 NOTE — ED Notes (Signed)
Called pt in the lobby x3 to be roomed, no response and do not see patient in the lobby.

## 2017-01-08 ENCOUNTER — Other Ambulatory Visit (HOSPITAL_COMMUNITY)
Admission: RE | Admit: 2017-01-08 | Discharge: 2017-01-08 | Disposition: A | Discharge status: Home / Self Care | Payer: Self-pay | Source: Ambulatory Visit | Attending: Family Medicine | Admitting: Family Medicine

## 2017-01-08 ENCOUNTER — Ambulatory Visit (INDEPENDENT_AMBULATORY_CARE_PROVIDER_SITE_OTHER): Payer: Self-pay | Admitting: Family Medicine

## 2017-01-08 ENCOUNTER — Encounter: Payer: Self-pay | Admitting: Family Medicine

## 2017-01-08 VITALS — BP 132/100 | HR 99 | Temp 97.3°F | Ht 63.0 in | Wt 198.4 lb

## 2017-01-08 DIAGNOSIS — Z01419 Encounter for gynecological examination (general) (routine) without abnormal findings: Secondary | ICD-10-CM | POA: Insufficient documentation

## 2017-01-08 DIAGNOSIS — Z113 Encounter for screening for infections with a predominantly sexual mode of transmission: Secondary | ICD-10-CM

## 2017-01-08 DIAGNOSIS — N926 Irregular menstruation, unspecified: Secondary | ICD-10-CM

## 2017-01-08 DIAGNOSIS — I10 Essential (primary) hypertension: Secondary | ICD-10-CM

## 2017-01-08 MED ORDER — CETIRIZINE HCL 10 MG PO TABS: 10.0000 mg | ORAL_TABLET | Freq: Every day | ORAL | 11 refills | Status: DC

## 2017-01-08 MED ORDER — FLUTICASONE PROPIONATE 50 MCG/ACT NA SUSP: 2.0000 | Freq: Every day | NASAL | 2 refills | Status: DC

## 2017-01-08 MED ORDER — ALBUTEROL SULFATE HFA 108 (90 BASE) MCG/ACT IN AERS: 2.0000 | INHALATION_SPRAY | RESPIRATORY_TRACT | 3 refills | Status: DC | PRN

## 2017-01-08 MED ORDER — HYDROCHLOROTHIAZIDE 25 MG PO TABS: 25.0000 mg | ORAL_TABLET | Freq: Every day | ORAL | 3 refills | Status: DC

## 2017-01-08 MED FILL — ?CETIRIZINE HCL 10 MG TABLE: 10 | 30 days supply | Qty: 30 | Fill #0

## 2017-01-08 MED FILL — FLUTICASONE PROP 50 MCG SPR: 50 | 30 days supply | Qty: 16 | Fill #0

## 2017-01-08 MED FILL — !VENTOLIN HFA INHALER: 108 (90 BAS | 30 days supply | Qty: 18 | Fill #0

## 2017-01-08 MED FILL — HYDROCHLOROTHIAZIDE 25 MG T: 25 | 30 days supply | Qty: 30 | Fill #0

## 2017-01-08 NOTE — Patient Instructions (Addendum)
For hypertension start HCTZ 25 mg once daily and continue Amlodipine 10 mg once daily.  You will be notified of lab results once they are received.  We will schedule you for pelvic and transvaginal ultrasound.    Hypertension Hypertension is another name for high blood pressure. High blood pressure forces your heart to work harder to pump blood. This can cause problems over time. There are two numbers in a blood pressure reading. There is a top number (systolic) over a bottom number (diastolic). It is best to have a blood pressure below 120/80. Healthy choices can help lower your blood pressure. You may need medicine to help lower your blood pressure if:  Your blood pressure cannot be lowered with healthy choices.  Your blood pressure is higher than 130/80. Follow these instructions at home: Eating and drinking   If directed, follow the DASH eating plan. This diet includes:  Filling half of your plate at each meal with fruits and vegetables.  Filling one quarter of your plate at each meal with whole grains. Whole grains include whole wheat pasta, [place] rice, and whole grain bread.  Eating or drinking low-fat dairy products, such as skim milk or low-fat yogurt.  Filling one quarter of your plate at each meal with low-fat (lean) proteins. Low-fat proteins include fish, skinless chicken, eggs, beans, and tofu.  Avoiding fatty meat, cured and processed meat, or chicken with skin.  Avoiding premade or processed food.  Eat less than 1,500 mg of salt (sodium) a day.  Limit alcohol use to no more than 1 drink a day for nonpregnant women and 2 drinks a day for men. One drink equals 12 oz of beer, 5 oz of wine, or 1 oz of hard liquor. Lifestyle   Work with your doctor to stay at a healthy weight or to lose weight. Ask your doctor what the best weight is for you.  Get at least 30 minutes of exercise that causes your heart to beat faster (aerobic exercise) most days of the week. This may  include walking, swimming, or biking.  Get at least 30 minutes of exercise that strengthens your muscles (resistance exercise) at least 3 days a week. This may include lifting weights or pilates.  Do not use any products that contain nicotine or tobacco. This includes cigarettes and e-cigarettes. If you need help quitting, ask your doctor.  Check your blood pressure at home as told by your doctor.  Keep all follow-up visits as told by your doctor. This is important. Medicines   Take over-the-counter and prescription medicines only as told by your doctor. Follow directions carefully.  Do not skip doses of blood pressure medicine. The medicine does not work as well if you skip doses. Skipping doses also puts you at risk for problems.  Ask your doctor about side effects or reactions to medicines that you should watch for. Contact a doctor if:  You think you are having a reaction to the medicine you are taking.  You have headaches that keep coming back (recurring).  You feel dizzy.  You have swelling in your ankles.  You have trouble with your vision. Get help right away if:  You get a very bad headache.  You start to feel confused.  You feel weak or numb.  You feel faint.  You get very bad pain in your:  Chest.  Belly (abdomen).  You throw up (vomit) more than once.  You have trouble breathing. Summary  Hypertension is another name for high  blood pressure.  Making healthy choices can help lower blood pressure. If your blood pressure cannot be controlled with healthy choices, you may need to take medicine. This information is not intended to replace advice given to you by your health care provider. Make sure you discuss any questions you have with your health care provider. Document Released: 01/24/2008 Document Revised: 07/05/2016 Document Reviewed: 07/05/2016 Elsevier Interactive Patient Education  2017 [employer].  Sexually Transmitted Disease A sexually  transmitted disease (STD) is a disease or infection often passed to another person during sex. However, STDs can be passed through nonsexual ways. An STD can be passed through:  Spit (saliva).  Semen.  Blood.  Mucus from the vagina.  Pee (urine). How can I lessen my chances of getting an STD?  Use:  Latex condoms.  Water-soluble lubricants with condoms. Do not use petroleum jelly or oils.  Dental dams. These are small pieces of latex that are used as a barrier during oral sex.  Avoid having more than one sex partner.  Do not have sex with someone who has other sex partners.  Do not have sex with anyone you do not know or who is at high risk for an STD.  Avoid risky sex that can break your skin.  Do not have sex if you have open sores on your mouth or skin.  Avoid drinking too much alcohol or taking illegal drugs. Alcohol and drugs can affect your good judgment.  Avoid oral and anal sex acts.  Get shots (vaccines) for HPV and hepatitis.  If you are at risk of being infected with HIV, it is advised that you take a certain medicine daily to prevent HIV infection. This is called pre-exposure prophylaxis (PrEP). You may be at risk if:  You are a man who has sex with other men (MSM).  You are attracted to the opposite sex (heterosexual) and are having sex with more than one partner.  You take drugs with a needle.  You have sex with someone who has HIV.  Talk with your doctor about if you are at high risk of being infected with HIV. If you begin to take PrEP, get tested for HIV first. Get tested every 3 months for as long as you are taking PrEP.  Get tested for STDs every year if you are sexually active. If you are treated for an STD, get tested again 3 months after you are treated. What should I do if I think I have an STD?  See your doctor.  Tell your sex partner(s) that you have an STD. They should be tested and treated.  Do not have sex until your doctor says it  is okay. When should I get help? Get help right away if:  You have bad belly (abdominal) pain.  You are a man and have puffiness (swelling) or pain in your testicles.  You are a woman and have puffiness in your vagina. This information is not intended to replace advice given to you by your health care provider. Make sure you discuss any questions you have with your health care provider. Document Released: 09/14/2004 Document Revised: 01/13/2016 Document Reviewed: 01/31/2013 Elsevier Interactive Patient Education  2017 [employer]  Abnormal Uterine Bleeding Abnormal uterine bleeding means bleeding more than usual from your uterus. It can include:  Bleeding between periods.  Bleeding after sex.  Bleeding that is heavier than normal.  Periods that last longer than usual.  Bleeding after you have stopped having your period (menopause).  There are many problems that may cause this. You should see a doctor for any kind of bleeding that is not normal. Treatment depends on the cause of the bleeding. Follow these instructions at home:  Watch your condition for any changes.  Do not use tampons, douche, or have sex, if your doctor tells you not to.  Change your pads often.  Get regular well-woman exams. Make sure they include a pelvic exam and cervical cancer screening.  Keep all follow-up visits as told by your doctor. This is important. Contact a doctor if:  The bleeding lasts more than one week.  You feel dizzy at times.  You feel like you are going to throw up (nauseous).  You throw up. Get help right away if:  You pass out.  You have to change pads every hour.  You have belly (abdominal) pain.  You have a fever.  You get sweaty.  You get weak.  You passing large blood clots from your vagina. Summary  Abnormal uterine bleeding means bleeding more than usual from your uterus.  There are many problems that may cause this. You should see a doctor for any kind  of bleeding that is not normal.  Treatment depends on the cause of the bleeding. This information is not intended to replace advice given to you by your health care provider. Make sure you discuss any questions you have with your health care provider. Document Released: 06/04/2009 Document Revised: 08/01/2016 Document Reviewed: 08/01/2016 Elsevier Interactive Patient Education  2017 [employer].

## 2017-01-08 NOTE — Progress Notes (Signed)
Patient ID: Leah Barber, female    DOB: 08/25/1975, 41 y.o.   MRN: [id number]  PCP: Scot Jun, FNP  Chief Complaint  Patient presents with  . Follow-up    BLOOD PRESSURE    Subjective:  HPI  Leah Barber is a 41 y.o. female presents for hypertension follow-up and PAP today.  Chronic problems: Includes polysubstance abuse, hypertension, bipolar disorder, cocaine abuse, personality disorder, benzodiazepine abuse, and chronic shoulder pain.  Gynecological   Reports lingering periods and irregular starts to cycle.  Painful menses compared to years prior. Occasional clots present during bleeding. She reports a history uterine fibroids and a hydatiform molar tumor removed in 2003 in [place], [place]. Reports a hx of abnormal PAP although uncertain of what diagnosis was associated with cytology results. Denies any prior cryotherapy or recommendation for a hysterectomy.  Hypertension No routine monitoring of blood pressure. Denies any associated headaches, shortness of breath, chest pain or dizziness. She reports associated lower leg and feet swelling. Doesn't adhere to any sodium  Restrictions. Asks again for opioid medication during today's visit , reports chronic pain "all over" in the absence of injury. She is followed by [facility] behavorial health for psychiatric needs.   Social History   Social History  . Marital status: Legally Separated    Spouse name: N/A  . Number of children: N/A  . Years of education: N/A   Occupational History  . Not on file.   Social History Main Topics  . Smoking status: Current Every Day Smoker    Packs/day: 1.00    Types: Cigarettes  . Smokeless tobacco: Never Used  . Alcohol use No  . Drug use: No  . Sexual activity: Not on file   Other Topics Concern  . Not on file   Social History Narrative  . No narrative on file    History reviewed. No pertinent family history. Review of Systems  See HPI  Patient Active Problem  List   Diagnosis Date Noted  . Polysubstance dependence including opioid type drug with complication, episodic abuse, with unspecified complication ([place]) 87/86/7672  . Depression 09/13/2015  . Cluster B personality disorder 05/15/2014  . Dependence on nicotine from cigarettes 05/15/2014  . Substance induced mood disorder ([place]) 05/15/2014  . Benzodiazepine abuse 09/20/2011  . Cannabis abuse 05/04/2011  . Cocaine abuse 05/04/2011  . Polysubstance dependence ([place]) 05/04/2011  . Candidiasis of vagina 04/30/2011  . Chronic pain in right shoulder 04/30/2011  . Depressive disorder, not elsewhere classified 04/30/2011  . Otitis media, acute 04/30/2011    Allergies  Allergen Reactions  . Hydrocodone Nausea And Vomiting    "stomach pain"  . Morphine Other (See Comments)    Prior to Admission medications   Medication Sig Start Date End Date Taking? Authorizing Provider  albuterol (PROVENTIL HFA;VENTOLIN HFA) 108 (90 Base) MCG/ACT inhaler Inhale 2 puffs into the lungs every 4 (four) hours as needed for wheezing or shortness of breath. 12/11/16  Yes Muthersbaugh, Jarrett Soho, PA-C  amLODipine (NORVASC) 10 MG tablet Take 1 tablet (10 mg total) by mouth daily. 11/23/16  Yes Scot Jun, FNP  DULoxetine (CYMBALTA) 60 MG capsule Take 60 mg by mouth 2 (two) times daily.   Yes [provider]  fluticasone (FLONASE) 50 MCG/ACT nasal spray Place 2 sprays into both nostrils daily. 12/11/16  Yes Muthersbaugh, Jarrett Soho, PA-C  gabapentin (NEURONTIN) 400 MG capsule Take 800 mg by mouth 3 (three) times daily.   Yes [provider]  ibuprofen (ADVIL,MOTRIN) 200 MG  tablet Take 400 mg by mouth every 6 (six) hours as needed for headache, mild pain or moderate pain.   Yes [provider]  lamoTRIgine (LAMICTAL) 25 MG tablet Take 25 mg by mouth daily.   Yes [provider]  omeprazole (PRILOSEC) 20 MG capsule Take 1 capsule (20 mg total) by mouth daily. 11/23/16  Yes Scot Jun,  FNP  Spacer/Aero-Holding Chambers (AEROCHAMBER PLUS WITH MASK) inhaler Use as instructed 12/11/16  Yes Muthersbaugh, Gwenlyn Perking    Past Medical, Surgical Family and Social History reviewed and updated.    Objective:   Today's Vitals   01/08/17 1049 01/08/17 1051 01/08/17 1052  BP: (!) 138/101 (!) 148/106 (!) 132/100  Pulse: 99    Temp: 97.3 F (36.3 C)    TempSrc: Oral    SpO2: 96%    Weight: 198 lb 6.4 oz (90 kg)    Height: 5\' 3"  (1.6 m)      Wt Readings from Last 3 Encounters:  01/08/17 198 lb 6.4 oz (90 kg)  12/29/16 190 lb (86.2 kg)  11/23/16 194 lb (88 kg)   Physical Exam  Constitutional: She appears well-developed and well-nourished.  Cardiovascular: Normal rate, regular rhythm, normal heart sounds and intact distal pulses.   Pulmonary/Chest: Effort normal and breath sounds normal.  Genitourinary: Vaginal discharge found.  Genitourinary Comments: Normal female external genitalia without lesion. No inguinal lymphadenopathy. Vaginal mucosa is pink and moist without lesions. Cervix is closed with white discharge, not friable. Pap smear obtained. No cervical motion tenderness, adnexal fullness or tenderness.   Psychiatric: Her behavior is normal. Judgment and thought content normal. Her mood appears anxious. Her speech is rapid and/or pressured. Cognition and memory are normal.   Assessment & Plan:  1. Encounter for gynecological examination with Papanicolaou smear of cervix - Cytology - PAP [place]  2. Screen for STD (sexually transmitted disease) - HIV antibody - RPR  3. Essential hypertension -Continue amlodipine -Start HCTZ 25 mg once daily for fluid retention  4. Irregular periods - FSH/LH -[place] NON-OB transvaginal US -US Pelvic Complete    RTC: 1 month for follow-up of irregular bleeding    -The patient was given clear instructions to go to ER or return to medical center if symptoms do not improve, worsen or new problems develop. The patient  verbalized understanding.   Carroll Sage. Kenton Kingfisher, MSN, FNP-C [facility]  [address] [place], [place] [address] [phone]

## 2017-01-09 LAB — CYTOLOGY - PAP
Adequacy: ABSENT
Bacterial vaginitis: POSITIVE — AB
Candida vaginitis: NEGATIVE
Chlamydia: NEGATIVE
Diagnosis: NEGATIVE
Neisseria Gonorrhea: NEGATIVE
Trichomonas: NEGATIVE

## 2017-01-09 LAB — RPR

## 2017-01-09 LAB — FSH/LH
FSH: 7.6 m[IU]/mL
LH: 10.5 m[IU]/mL

## 2017-01-09 LAB — HIV ANTIBODY (ROUTINE TESTING W REFLEX): HIV 1&2 Ab, 4th Generation: NONREACTIVE

## 2017-01-09 MED ORDER — METRONIDAZOLE 500 MG PO TABS: 500.0000 mg | ORAL_TABLET | Freq: Two times a day (BID) | ORAL | 0 refills | Status: DC

## 2017-01-10 MED FILL — metroNIDAZOLE 500 MG TABS: 500 | 7 days supply | Qty: 14 | Fill #0

## 2017-01-12 ENCOUNTER — Telehealth: Payer: Self-pay

## 2017-01-12 LAB — CERVICOVAGINAL ANCILLARY ONLY: Herpes: NEGATIVE

## 2017-01-12 NOTE — Telephone Encounter (Signed)
Patient is schedule for [place] on 01/22/2017 at 10am at [facility] hospital. Patient must arrive at 945 and have a full bladder.

## 2017-01-12 NOTE — Telephone Encounter (Signed)
-----   Message from Scot Jun, [place] sent at 01/11/2017 11:48 PM EDT ----- Please schedule vaginal/pelvic ultrasounds

## 2017-01-12 NOTE — Telephone Encounter (Signed)
-----   Message from Scot Jun, [place] sent at 01/11/2017 11:48 PM EDT ----- Please schedule vaginal/pelvic ultrasounds

## 2017-01-12 NOTE — Telephone Encounter (Signed)
Patient notified of appointment.  

## 2017-01-12 NOTE — Telephone Encounter (Signed)
-----   Message from Scot Jun, [place] sent at 01/11/2017 11:48 PM EDT ----- Please schedule vaginal/pelvic ultrasounds

## 2017-01-22 ENCOUNTER — Ambulatory Visit (HOSPITAL_COMMUNITY)
Admission: RE | Admit: 2017-01-22 | Discharge: 2017-01-22 | Disposition: A | Discharge status: Home / Self Care | Payer: Self-pay | Source: Ambulatory Visit | Attending: Family Medicine | Admitting: Family Medicine

## 2017-01-22 ENCOUNTER — Telehealth: Payer: Self-pay | Admitting: Family Medicine

## 2017-01-22 DIAGNOSIS — Z01419 Encounter for gynecological examination (general) (routine) without abnormal findings: Secondary | ICD-10-CM

## 2017-01-22 DIAGNOSIS — N926 Irregular menstruation, unspecified: Secondary | ICD-10-CM | POA: Insufficient documentation

## 2017-01-22 DIAGNOSIS — D251 Intramural leiomyoma of uterus: Secondary | ICD-10-CM | POA: Insufficient documentation

## 2017-01-22 NOTE — Telephone Encounter (Signed)
Recent ultrasound showed a small posterior intramural fibroids. Please call patient with results.  Advise her that  I can refer her to gynecology for further work-up and she will have to  pay out of pocket if she has not completed her application for financial assistance.   Leah Barber. Kenton Kingfisher, MSN, FNP-C [facility]  [address] [place], [place] [address] [phone]

## 2017-01-23 NOTE — Telephone Encounter (Signed)
Tried to call patient but no vm set up.

## 2017-01-23 NOTE — Telephone Encounter (Signed)
Spoke with patient and she will wait until she gets the assistance before she makes a appointment

## 2017-02-12 ENCOUNTER — Ambulatory Visit: Payer: Self-pay | Admitting: Family Medicine

## 2017-02-16 MED FILL — FLUTICASONE PROP 50 MCG SPR: 50 | 30 days supply | Qty: 16 | Fill #1

## 2017-02-22 MED FILL — AMLODIPINE BESYLATE 10 MG T: 10 | 90 days supply | Qty: 90 | Fill #1

## 2017-02-22 MED FILL — GABAPENTIN 800 MG TABLET: 800 | 30 days supply | Qty: 90 | Fill #0

## 2017-02-22 MED FILL — DULoxetine HCL 60 MG CPEP: 60 | 30 days supply | Qty: 60 | Fill #0

## 2017-03-05 ENCOUNTER — Ambulatory Visit: Payer: Self-pay | Admitting: Family Medicine

## 2017-03-08 ENCOUNTER — Encounter: Payer: Self-pay | Admitting: Family Medicine

## 2017-03-08 ENCOUNTER — Other Ambulatory Visit (HOSPITAL_COMMUNITY)
Admission: RE | Admit: 2017-03-08 | Discharge: 2017-03-08 | Disposition: A | Discharge status: Home / Self Care | Payer: Self-pay | Source: Ambulatory Visit | Attending: Family Medicine | Admitting: Family Medicine

## 2017-03-08 ENCOUNTER — Ambulatory Visit (INDEPENDENT_AMBULATORY_CARE_PROVIDER_SITE_OTHER): Payer: Self-pay | Admitting: Family Medicine

## 2017-03-08 VITALS — BP 124/88 | HR 96 | Wt 205.0 lb

## 2017-03-08 DIAGNOSIS — N898 Other specified noninflammatory disorders of vagina: Secondary | ICD-10-CM | POA: Insufficient documentation

## 2017-03-08 DIAGNOSIS — B9689 Other specified bacterial agents as the cause of diseases classified elsewhere: Secondary | ICD-10-CM | POA: Insufficient documentation

## 2017-03-08 MED ORDER — METRONIDAZOLE 500 MG PO TABS: 500.0000 mg | ORAL_TABLET | Freq: Two times a day (BID) | ORAL | 0 refills | Status: DC

## 2017-03-08 MED FILL — metroNIDAZOLE 500 MG TABS: 500 | 7 days supply | Qty: 14 | Fill #0

## 2017-03-08 NOTE — Patient Instructions (Signed)

## 2017-03-08 NOTE — Progress Notes (Signed)
Patient ID: Leah Barber, female    DOB: 06-08-1976, 41 y.o.   MRN: [id number]  PCP: Scot Jun, FNP  Chief Complaint  Patient presents with  . Follow-up    Subjective:  HPI Leah Barber is a 41 y.o. female presents for reoccurrence of bacterial vaginosis.  Solara reports a recent sexual encounter with a new partner and subsequently developed a thicken vaginal discharge, accompanied with a strong odor, and vaginal itching. Reports that vaginal discharge odor is the same as her last occurrence of bacterial vaginosis. Patient reports no use of barrier protection. Denies any associated fever, nausea, or vomiting.   Social History   Social History  . Marital status: Legally Separated    Spouse name: N/A  . Number of children: N/A  . Years of education: N/A   Occupational History  . Not on file.   Social History Main Topics  . Smoking status: Current Every Day Smoker    Packs/day: 1.00    Types: Cigarettes  . Smokeless tobacco: Never Used  . Alcohol use No  . Drug use: No  . Sexual activity: Not on file   Other Topics Concern  . Not on file   Social History Narrative  . No narrative on file   No family history on file.  Review of Systems See HPI Patient Active Problem List   Diagnosis Date Noted  . Polysubstance dependence including opioid type drug with complication, episodic abuse, with unspecified complication ([place]) 17/00/1749  . Depression 09/13/2015  . Cluster B personality disorder 05/15/2014  . Dependence on nicotine from cigarettes 05/15/2014  . Substance induced mood disorder ([place]) 05/15/2014  . Benzodiazepine abuse 09/20/2011  . Cannabis abuse 05/04/2011  . Cocaine abuse 05/04/2011  . Polysubstance dependence ([place]) 05/04/2011  . Candidiasis of vagina 04/30/2011  . Chronic pain in right shoulder 04/30/2011  . Depressive disorder, not elsewhere classified 04/30/2011  . Otitis media, acute 04/30/2011    Allergies  Allergen Reactions  .  Hydrocodone Nausea And Vomiting    "stomach pain"  . Morphine Other (See Comments)    Prior to Admission medications   Medication Sig Start Date End Date Taking? Authorizing Provider  albuterol (PROVENTIL HFA;VENTOLIN HFA) 108 (90 Base) MCG/ACT inhaler Inhale 2 puffs into the lungs every 4 (four) hours as needed for wheezing or shortness of breath. 01/08/17  Yes Scot Jun, FNP  amLODipine (NORVASC) 10 MG tablet Take 1 tablet (10 mg total) by mouth daily. 11/23/16  Yes Scot Jun, FNP  cetirizine (ZYRTEC) 10 MG tablet Take 1 tablet (10 mg total) by mouth daily. 01/08/17  Yes Scot Jun, FNP  DULoxetine (CYMBALTA) 60 MG capsule Take 60 mg by mouth 2 (two) times daily.   Yes [provider]  fluticasone (FLONASE) 50 MCG/ACT nasal spray Place 2 sprays into both nostrils daily. 01/08/17  Yes Scot Jun, FNP  gabapentin (NEURONTIN) 400 MG capsule Take 800 mg by mouth 3 (three) times daily.   Yes [provider]  hydrochlorothiazide (HYDRODIURIL) 25 MG tablet Take 1 tablet (25 mg total) by mouth daily. 01/08/17  Yes Scot Jun, FNP  ibuprofen (ADVIL,MOTRIN) 200 MG tablet Take 400 mg by mouth every 6 (six) hours as needed for headache, mild pain or moderate pain.   Yes [provider]  lamoTRIgine (LAMICTAL) 25 MG tablet Take 25 mg by mouth daily.   Yes [provider]  metroNIDAZOLE (FLAGYL) 500 MG tablet Take 1 tablet (500 mg total) by mouth  2 (two) times daily with a meal. DO NOT CONSUME ALCOHOL WHILE TAKING THIS MEDICATION. 01/09/17  Yes Scot Jun, FNP  omeprazole (PRILOSEC) 20 MG capsule Take 1 capsule (20 mg total) by mouth daily. 11/23/16  Yes Scot Jun, FNP  Spacer/Aero-Holding Chambers (AEROCHAMBER PLUS WITH MASK) inhaler Use as instructed 12/11/16  Yes Muthersbaugh, Gwenlyn Perking    Past Medical, Surgical Family and Social History reviewed and updated.    Objective:   Today's Vitals   03/08/17 1512  BP:  124/88  Pulse: 96  SpO2: 94%  Weight: 205 lb (93 kg)    Wt Readings from Last 3 Encounters:  03/08/17 205 lb (93 kg)  01/08/17 198 lb 6.4 oz (90 kg)  12/29/16 190 lb (86.2 kg)    Physical Exam  Constitutional: She is oriented to person, place, and time. She appears well-developed and well-nourished.  Cardiovascular: Normal rate, regular rhythm, normal heart sounds and intact distal pulses.   Pulmonary/Chest: Effort normal and breath sounds normal.  Genitourinary:  Genitourinary Comments: Patient collected vaginal specimen  Musculoskeletal: Normal range of motion.  Neurological: She is alert and oriented to person, place, and time.  Psychiatric: She has a normal mood and affect. Her behavior is normal. Judgment and thought content normal.   Assessment & Plan:  1. Vaginal odor 2. Vaginal discharge - Cervicovaginal ancillary only pending -Will treat empirically for bacterial vaginosis with metronidazole 500 mg, twice daily x 7 days, pending results of vaginal specimen.  RTC: If symptoms worsen or do not improve.   Carroll Sage. Kenton Kingfisher, MSN, FNP-C [facility]  [address] [place], [place] [address] [phone]

## 2017-03-09 LAB — CERVICOVAGINAL ANCILLARY ONLY
Bacterial vaginitis: POSITIVE — AB
Candida vaginitis: NEGATIVE
Chlamydia: NEGATIVE
Neisseria Gonorrhea: NEGATIVE
Trichomonas: NEGATIVE

## 2017-03-23 MED FILL — DULoxetine HCL 60 MG CPEP: 60 | 30 days supply | Qty: 60 | Fill #1

## 2017-03-23 MED FILL — GABAPENTIN 800 MG TABLET: 800 | 30 days supply | Qty: 90 | Fill #1

## 2017-03-26 ENCOUNTER — Ambulatory Visit: Payer: Medicaid Other | Admitting: Family Medicine

## 2017-04-24 ENCOUNTER — Telehealth: Payer: Self-pay

## 2017-04-24 MED ORDER — DULOXETINE HCL 60 MG PO CPEP
60.0000 mg | ORAL_CAPSULE | Freq: Two times a day (BID) | ORAL | 0 refills | Status: DC
Start: 2017-04-24 — End: 2017-05-24

## 2017-04-24 NOTE — Telephone Encounter (Signed)
This medication will require an office visit. She should go to nearest urgent care for evaluation if she is unable to schedule an appointment here in office.

## 2017-04-24 NOTE — Telephone Encounter (Signed)
Patient would like to know if you would send a script for Flagyl. Patient states that she has a bad odor in vaginal area and flank pain. Patient is in [place], [place] taking care of her mother and is unable to make it to the office at this time.

## 2017-04-24 NOTE — Telephone Encounter (Signed)
Leah Barber notified patient that she needed a appointment in order to get vaginal medication
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
## 2017-05-24 ENCOUNTER — Other Ambulatory Visit: Payer: Self-pay

## 2017-05-24 MED ORDER — DULOXETINE HCL 60 MG PO CPEP: 60.0000 mg | ORAL_CAPSULE | Freq: Two times a day (BID) | ORAL | 0 refills | Status: DC

## 2017-05-30 ENCOUNTER — Ambulatory Visit: Payer: Self-pay | Admitting: Family Medicine

## 2017-06-06 ENCOUNTER — Other Ambulatory Visit (HOSPITAL_COMMUNITY)
Admission: RE | Admit: 2017-06-06 | Discharge: 2017-06-06 | Disposition: A | Discharge status: Home / Self Care | Payer: Medicaid Other | Source: Ambulatory Visit | Attending: Family Medicine | Admitting: Family Medicine

## 2017-06-06 ENCOUNTER — Ambulatory Visit (INDEPENDENT_AMBULATORY_CARE_PROVIDER_SITE_OTHER): Payer: Self-pay | Admitting: Family Medicine

## 2017-06-06 ENCOUNTER — Encounter: Payer: Self-pay | Admitting: Family Medicine

## 2017-06-06 ENCOUNTER — Ambulatory Visit (HOSPITAL_COMMUNITY)
Admission: RE | Admit: 2017-06-06 | Discharge: 2017-06-06 | Disposition: A | Discharge status: Home / Self Care | Payer: Self-pay | Source: Ambulatory Visit | Attending: Family Medicine | Admitting: Family Medicine

## 2017-06-06 VITALS — BP 134/90 | HR 82 | Temp 98.6°F | Resp 14 | Ht 63.0 in | Wt 200.8 lb

## 2017-06-06 DIAGNOSIS — N898 Other specified noninflammatory disorders of vagina: Secondary | ICD-10-CM | POA: Insufficient documentation

## 2017-06-06 DIAGNOSIS — N760 Acute vaginitis: Secondary | ICD-10-CM | POA: Insufficient documentation

## 2017-06-06 DIAGNOSIS — M25571 Pain in right ankle and joints of right foot: Secondary | ICD-10-CM | POA: Insufficient documentation

## 2017-06-06 DIAGNOSIS — B9689 Other specified bacterial agents as the cause of diseases classified elsewhere: Secondary | ICD-10-CM | POA: Insufficient documentation

## 2017-06-06 LAB — POCT URINALYSIS DIP (DEVICE)
Bilirubin Urine: NEGATIVE
Glucose, UA: NEGATIVE mg/dL
Ketones, ur: NEGATIVE mg/dL
Leukocytes, UA: NEGATIVE
Nitrite: NEGATIVE
Protein, ur: NEGATIVE mg/dL
Specific Gravity, Urine: 1.025 (ref 1.005–1.030)
Urobilinogen, UA: 0.2 mg/dL (ref 0.0–1.0)
pH: 7 (ref 5.0–8.0)

## 2017-06-06 MED ORDER — MELOXICAM 15 MG PO TABS: 15.0000 mg | ORAL_TABLET | Freq: Every day | ORAL | 0 refills | Status: DC

## 2017-06-06 MED ORDER — METRONIDAZOLE 500 MG PO TABS: 500.0000 mg | ORAL_TABLET | Freq: Two times a day (BID) | ORAL | 0 refills | Status: DC

## 2017-06-06 MED FILL — metroNIDAZOLE 500 MG TABS: 500 | 7 days supply | Qty: 14 | Fill #0

## 2017-06-06 MED FILL — ?MELOXICAM 15MG TABLET: 15 | 30 days supply | Qty: 30 | Fill #0

## 2017-06-06 NOTE — Progress Notes (Signed)
Patient ID: Leah Barber, female    DOB: [birth year], 41 y.o.   MRN: [id number]  PCP: Scot Jun, FNP  Chief Complaint  Patient presents with  . Vaginal Itching    vaginal odor    Subjective:  HPI Leah Barber is a 41 y.o. female presents for evaluation of vaginal odor and itching. Leah Barber reports a 1 week history of symptoms. She denies recent changes in sexual partners. She endorses engagement in unprotected sex. Denies dysuria, abdominal pain, CVA tenderness, fever,or lower pelvic pain. Leah Barber complains of right ankle pain x 1.5 months. She reports dropping a cement block on her ankle and did not obtain medical evaluation. Reports localized tenderness to medial lateral region of right ankle and mild healed abrasion. She   Social History   Social History  . Marital status: Legally Separated    Spouse name: N/A  . Number of children: N/A  . Years of education: N/A   Occupational History  . Not on file.   Social History Main Topics  . Smoking status: Current Every Day Smoker    Packs/day: 1.00    Types: Cigarettes  . Smokeless tobacco: Never Used  . Alcohol use No  . Drug use: No  . Sexual activity: Not on file   Other Topics Concern  . Not on file   Social History Narrative  . No narrative on file    Family History  Problem Relation Age of Onset  . Diabetes Mother   . Hypertension Mother   . Cancer Father    Review of Systems See HPI Patient Active Problem List   Diagnosis Date Noted  . Polysubstance dependence including opioid type drug with complication, episodic abuse, with unspecified complication ([place]) 56/38/7564  . Depression 09/13/2015  . Cluster B personality disorder ([place]) 05/15/2014  . Dependence on nicotine from cigarettes 05/15/2014  . Substance induced mood disorder ([place]) 05/15/2014  . Benzodiazepine abuse ([place]) 09/20/2011  . Cannabis abuse 05/04/2011  . Cocaine abuse ([place]) 05/04/2011  . Polysubstance dependence ([place]) 05/04/2011  .  Candidiasis of vagina 04/30/2011  . Chronic pain in right shoulder 04/30/2011  . Depressive disorder, not elsewhere classified 04/30/2011  . Otitis media, acute 04/30/2011    Allergies  Allergen Reactions  . Hydrocodone Nausea And Vomiting    "stomach pain"  . Morphine Other (See Comments)    Prior to Admission medications   Medication Sig Start Date End Date Taking? Authorizing Provider  albuterol (PROVENTIL HFA;VENTOLIN HFA) 108 (90 Base) MCG/ACT inhaler Inhale 2 puffs into the lungs every 4 (four) hours as needed for wheezing or shortness of breath. 01/08/17  Yes Scot Jun, FNP  amLODipine (NORVASC) 10 MG tablet Take 1 tablet (10 mg total) by mouth daily. 11/23/16  Yes Scot Jun, FNP  cetirizine (ZYRTEC) 10 MG tablet Take 1 tablet (10 mg total) by mouth daily. 01/08/17  Yes Scot Jun, FNP  DULoxetine (CYMBALTA) 60 MG capsule Take 1 capsule (60 mg total) by mouth 2 (two) times daily. 05/24/17  Yes Scot Jun, FNP  fluticasone (FLONASE) 50 MCG/ACT nasal spray Place 2 sprays into both nostrils daily. 01/08/17  Yes Scot Jun, FNP  gabapentin (NEURONTIN) 400 MG capsule Take 800 mg by mouth 3 (three) times daily.   Yes [provider]  hydrochlorothiazide (HYDRODIURIL) 25 MG tablet Take 1 tablet (25 mg total) by mouth daily. 01/08/17  Yes Scot Jun, FNP  ibuprofen (ADVIL,MOTRIN) 200 MG tablet Take 400 mg by mouth every 6 (  six) hours as needed for headache, mild pain or moderate pain.   Yes [provider]  lamoTRIgine (LAMICTAL) 25 MG tablet Take 25 mg by mouth daily.   Yes [provider]  omeprazole (PRILOSEC) 20 MG capsule Take 1 capsule (20 mg total) by mouth daily. 11/23/16  Yes Scot Jun, FNP  Spacer/Aero-Holding Chambers (AEROCHAMBER PLUS WITH MASK) inhaler Use as instructed Patient not taking: Reported on 06/06/2017 12/11/16   Muthersbaugh, Gwenlyn Perking    Past Medical, Surgical Family and Social History  reviewed and updated.    Objective:   Today's Vitals   06/06/17 1419  BP: 134/90  Pulse: 82  Resp: 14  Temp: 98.6 F (37 C)  TempSrc: Oral  SpO2: 98%  Weight: 200 lb 12.8 oz (91.1 kg)  Height: 5\' 3"  (1.6 m)    Wt Readings from Last 3 Encounters:  06/06/17 200 lb 12.8 oz (91.1 kg)  03/08/17 205 lb (93 kg)  01/08/17 198 lb 6.4 oz (90 kg)   Physical Exam  Constitutional: She is oriented to person, place, and time. She appears well-developed and well-nourished.  Cardiovascular: Normal rate, regular rhythm, normal heart sounds and intact distal pulses.   Pulmonary/Chest: Effort normal and breath sounds normal.  Genitourinary:  Genitourinary Comments: Patient self-collected vaginal specimen  Musculoskeletal:       Right ankle: She exhibits swelling. Tenderness.  Neurological: She is alert and oriented to person, place, and time.  Skin: Skin is warm and dry.  Psychiatric: She has a normal mood and affect. Her behavior is normal. Judgment and thought content normal.   Assessment & Plan:  1. Vaginal discharge, patient has a recent history of recurring bacterial vaginosis. Will treat with metronidazole while vaginal specimen is pending. Screening for STI. Patient declined HIV and RPR. - Cervicovaginal ancillary only  2. Left Ankle Pain, mildly swollen. Will treat with anti-inflammatory and obtain an image as injury occurred 1.5 months prior and localized swelling remains present.   Orders Placed This Encounter  Procedures  . DG Ankle Complete Right  . POCT urinalysis dip (device)    Meds ordered this encounter  Medications  . metroNIDAZOLE (FLAGYL) 500 MG tablet    Sig: Take 1 tablet (500 mg total) by mouth 2 (two) times daily with a meal. DO NOT CONSUME ALCOHOL WHILE TAKING THIS MEDICATION.    Dispense:  14 tablet    Refill:  0    Order Specific Question:   Supervising Provider    Answer:   Tresa Garter [id number]  . meloxicam (MOBIC) 15 MG tablet    Sig: Take 1  tablet (15 mg total) by mouth daily.    Dispense:  30 tablet    Refill:  0    Order Specific Question:   Supervising Provider    Answer:   Tresa Garter [[id number]]    Return for care at scheduled follow-up or sooner if needed.   Carroll Sage. Kenton Kingfisher, MSN, FNP-C [facility]  [address] Barbara C[address] [place], [place] [address] [phone]

## 2017-06-07 ENCOUNTER — Telehealth: Payer: Self-pay

## 2017-06-07 LAB — CERVICOVAGINAL ANCILLARY ONLY
Bacterial vaginitis: POSITIVE — AB
Candida vaginitis: NEGATIVE
Chlamydia: NEGATIVE
Neisseria Gonorrhea: NEGATIVE
Trichomonas: NEGATIVE

## 2017-06-08 NOTE — Telephone Encounter (Signed)
Spoke with patient and let her know to contact the number that on the form for assistance

## 2017-06-20 ENCOUNTER — Other Ambulatory Visit: Payer: Self-pay

## 2017-06-20 ENCOUNTER — Telehealth: Payer: Self-pay

## 2017-06-20 DIAGNOSIS — N898 Other specified noninflammatory disorders of vagina: Secondary | ICD-10-CM

## 2017-06-20 DIAGNOSIS — R1030 Lower abdominal pain, unspecified: Secondary | ICD-10-CM

## 2017-06-20 DIAGNOSIS — I10 Essential (primary) hypertension: Secondary | ICD-10-CM

## 2017-06-20 MED ORDER — HYDROCHLOROTHIAZIDE 25 MG PO TABS: 25.0000 mg | ORAL_TABLET | Freq: Every day | ORAL | 3 refills | Status: DC

## 2017-06-20 MED ORDER — MELOXICAM 15 MG PO TABS: 15.0000 mg | ORAL_TABLET | Freq: Every day | ORAL | 1 refills | Status: DC

## 2017-06-20 MED ORDER — MELOXICAM 15 MG PO TABS: 15.0000 mg | ORAL_TABLET | Freq: Every day | ORAL | 2 refills | Status: DC

## 2017-06-20 MED ORDER — GABAPENTIN 400 MG PO CAPS: 800.0000 mg | ORAL_CAPSULE | Freq: Three times a day (TID) | ORAL | 3 refills | Status: DC

## 2017-06-20 MED ORDER — FLUTICASONE PROPIONATE 50 MCG/ACT NA SUSP: 2.0000 | Freq: Every day | NASAL | 2 refills | Status: DC

## 2017-06-20 MED ORDER — DULOXETINE HCL 60 MG PO CPEP: 60.0000 mg | ORAL_CAPSULE | Freq: Two times a day (BID) | ORAL | 0 refills | Status: DC

## 2017-06-20 MED ORDER — OMEPRAZOLE 20 MG PO CPDR: 20.0000 mg | DELAYED_RELEASE_CAPSULE | Freq: Every day | ORAL | 3 refills | Status: DC

## 2017-06-20 MED ORDER — CETIRIZINE HCL 10 MG PO TABS: 10.0000 mg | ORAL_TABLET | Freq: Every day | ORAL | 11 refills | Status: DC

## 2017-06-20 MED ORDER — AMLODIPINE BESYLATE 10 MG PO TABS: 10.0000 mg | ORAL_TABLET | Freq: Every day | ORAL | 1 refills | Status: DC

## 2017-06-20 MED ORDER — ALBUTEROL SULFATE HFA 108 (90 BASE) MCG/ACT IN AERS: 2.0000 | INHALATION_SPRAY | RESPIRATORY_TRACT | 3 refills | Status: DC | PRN

## 2017-06-20 MED ORDER — DULOXETINE HCL 60 MG PO CPEP: 60.0000 mg | ORAL_CAPSULE | Freq: Two times a day (BID) | ORAL | 2 refills | Status: DC

## 2017-06-20 NOTE — Telephone Encounter (Signed)
Medications sent

## 2017-09-10 ENCOUNTER — Ambulatory Visit: Payer: Self-pay | Admitting: Family Medicine

## 2017-10-22 ENCOUNTER — Ambulatory Visit: Payer: Medicaid Other | Admitting: Family Medicine

## 2017-10-24 ENCOUNTER — Other Ambulatory Visit: Payer: Self-pay

## 2017-10-24 MED ORDER — DULOXETINE HCL 60 MG PO CPEP: 60.0000 mg | ORAL_CAPSULE | Freq: Two times a day (BID) | ORAL | 0 refills | Status: DC

## 2017-10-24 NOTE — Telephone Encounter (Signed)
Medication sent to different pharmacy per patient

## 2017-11-26 ENCOUNTER — Other Ambulatory Visit: Payer: Self-pay

## 2017-11-26 MED ORDER — DULOXETINE HCL 60 MG PO CPEP: 60.0000 mg | ORAL_CAPSULE | Freq: Two times a day (BID) | ORAL | 0 refills | Status: DC

## 2017-11-26 NOTE — Telephone Encounter (Signed)
Cymbalta was sent into the pharmacy and patient needs to schedule follow up.

## 2017-12-19 ENCOUNTER — Ambulatory Visit: Payer: Medicaid Other | Admitting: Family Medicine

## 2018-12-20 HISTORY — PX: OTHER SURGICAL HISTORY: SHX169

## 2019-05-20 ENCOUNTER — Other Ambulatory Visit: Payer: Self-pay

## 2019-05-20 ENCOUNTER — Encounter: Payer: Self-pay | Admitting: Family Medicine

## 2019-05-20 ENCOUNTER — Ambulatory Visit (INDEPENDENT_AMBULATORY_CARE_PROVIDER_SITE_OTHER): Payer: Self-pay | Admitting: Family Medicine

## 2019-05-20 VITALS — BP 97/58 | HR 84 | Temp 98.6°F | Resp 16 | Ht 62.0 in | Wt 203.0 lb

## 2019-05-20 DIAGNOSIS — F909 Attention-deficit hyperactivity disorder, unspecified type: Secondary | ICD-10-CM

## 2019-05-20 DIAGNOSIS — I10 Essential (primary) hypertension: Secondary | ICD-10-CM

## 2019-05-20 DIAGNOSIS — F1920 Other psychoactive substance dependence, uncomplicated: Secondary | ICD-10-CM

## 2019-05-20 DIAGNOSIS — M7989 Other specified soft tissue disorders: Secondary | ICD-10-CM

## 2019-05-20 DIAGNOSIS — M79661 Pain in right lower leg: Secondary | ICD-10-CM

## 2019-05-20 DIAGNOSIS — B379 Candidiasis, unspecified: Secondary | ICD-10-CM

## 2019-05-20 DIAGNOSIS — F1994 Other psychoactive substance use, unspecified with psychoactive substance-induced mood disorder: Secondary | ICD-10-CM

## 2019-05-20 DIAGNOSIS — E119 Type 2 diabetes mellitus without complications: Secondary | ICD-10-CM

## 2019-05-20 LAB — POCT GLYCOSYLATED HEMOGLOBIN (HGB A1C): Hemoglobin A1C: 5.9 % — AB (ref 4.0–5.6)

## 2019-05-20 MED ORDER — FLUCONAZOLE 150 MG PO TABS: 150.0000 mg | ORAL_TABLET | Freq: Once | ORAL | 0 refills | Status: AC

## 2019-05-20 NOTE — Progress Notes (Signed)
Integrated Behavioral Health Referral Note  Reason for Referral: Leah Barber is a 43 y.o. female  Pt was referred by NP, Leah Barber for: substance use and transportation    Pt reports the following concerns: requesting resources for substance use treatment, just moved back to [place] from [place]: 1. Addressed today: Patient reported need for substance use resources for suboxone maintenance. Provided resources, including [facility]. Patient indicated she has been to [facility] already, though disagrees with their proposed adjustments to her other medications. Identified other possible resources with patient. Provided information on [facility], which can also support in connecting to suboxone and methadone clinics and supports with harm reduction.   Patient is in the process of having her Medicaid transferred to [place] from [place], has already started this process with a social services case worker.   Patient in need of transportation support. Provided resources abd advised that patient may use Medicaid transportation to appointments once Medicaid is active in [place].   Also in need of mental health support. Ref[facility] to [facility]; patient already familiar with [facility] and reports receiving services there in the past.   2. Referral: [facility], [facility], [place], [facility]  3. Follow up: No additional follow up needed at this time, available from clinic as needed  Leah Barber, [place] [phone]

## 2019-05-21 ENCOUNTER — Ambulatory Visit (HOSPITAL_COMMUNITY): Payer: Self-pay

## 2019-05-21 ENCOUNTER — Ambulatory Visit (HOSPITAL_COMMUNITY): Payer: Self-pay | Attending: Family Medicine

## 2019-05-21 LAB — CBC WITH DIFFERENTIAL/PLATELET
Basophils Absolute: 0.1 10*3/uL (ref 0.0–0.2)
Basos: 1 %
EOS (ABSOLUTE): 0.5 10*3/uL — ABNORMAL HIGH (ref 0.0–0.4)
Eos: 6 %
Hematocrit: 38.8 % (ref 34.0–46.6)
Hemoglobin: 12.4 g/dL (ref 11.1–15.9)
Immature Grans (Abs): 0 10*3/uL (ref 0.0–0.1)
Immature Granulocytes: 1 %
Lymphocytes Absolute: 3.3 10*3/uL — ABNORMAL HIGH (ref 0.7–3.1)
Lymphs: 37 %
MCH: 29.7 pg (ref 26.6–33.0)
MCHC: 32 g/dL (ref 31.5–35.7)
MCV: 93 fL (ref 79–97)
Monocytes Absolute: 0.6 10*3/uL (ref 0.1–0.9)
Monocytes: 7 %
Neutrophils Absolute: 4.2 10*3/uL (ref 1.4–7.0)
Neutrophils: 48 %
Platelets: 281 10*3/uL (ref 150–450)
RBC: 4.18 x10E6/uL (ref 3.77–5.28)
RDW: 12.9 % (ref 11.7–15.4)
WBC: 8.7 10*3/uL (ref 3.4–10.8)

## 2019-05-21 LAB — COMPREHENSIVE METABOLIC PANEL
ALT: 26 IU/L (ref 0–32)
AST: 22 IU/L (ref 0–40)
Albumin/Globulin Ratio: 2 (ref 1.2–2.2)
Albumin: 4.6 g/dL (ref 3.8–4.8)
Alkaline Phosphatase: 93 IU/L (ref 39–117)
BUN/Creatinine Ratio: 12 (ref 9–23)
BUN: 12 mg/dL (ref 6–24)
Bilirubin Total: 0.3 mg/dL (ref 0.0–1.2)
CO2: 16 mmol/L — ABNORMAL LOW (ref 20–29)
Calcium: 8.8 mg/dL (ref 8.7–10.2)
Chloride: 111 mmol/L — ABNORMAL HIGH (ref 96–106)
Creatinine, Ser: 0.99 mg/dL (ref 0.57–1.00)
GFR calc Af Amer: 80 mL/min/{1.73_m2} (ref >59)
GFR calc non Af Amer: 69 mL/min/{1.73_m2} (ref >59)
Globulin, Total: 2.3 g/dL (ref 1.5–4.5)
Glucose: 73 mg/dL (ref 65–99)
Potassium: 3.8 mmol/L (ref 3.5–5.2)
Sodium: 142 mmol/L (ref 134–144)
Total Protein: 6.9 g/dL (ref 6.0–8.5)

## 2019-05-21 NOTE — Progress Notes (Signed)
New Patient Office Visit  Subjective:  Patient ID: Leah Barber, female    DOB: [birth year]  Age: 43 y.o. MRN: [id number]  CC:  Chief Complaint  Patient presents with  . Establish Care  . Leg Swelling    right leg swelling x 4 weeks     HPI Leah Barber is a 43 year old female that presents to establish care.  Patient has a medical history significant for hypertension, type 2 diabetes mellitus, polysubstance abuse, hypertension, bipolar disorder, history of cocaine abuse, history of personality disorder, history of attention deficit disorder, benzodiazepine abuse, and chronic pain syndrome.  Leah Barber was previously a patient at this clinic, but relocated to [place] over the past 2 years.  It appears that patient has relocated on different occasions and has an extensive medical history.  Patient is a very poor historian.  However she does report a history of polysubstance abuse.  She has been on Suboxone over the last several years and is in remission.  Since relocating back to [place], she is not under the care of psychiatry.  Patient is requesting referral to psychiatry.  She continues to take duloxetine and gabapentin consistently.  Patient also has history of hypertension and type 2 diabetes mellitus.  She says that she takes amlodipine daily and blood pressure is controlled at home.  She does not exercise or follow a low-fat diet.  Patient states that she has been on metformin for a number of years for type 2 diabetes mellitus.  Her last A1c was 5.9.  She currently denies headache, chest pain, heart palpitations, polyuria, polydipsia, or polyphagia.  Patient's primary complaint today is right lower extremity edema and pain.  She states that symptoms have been occurring over the past week.  Pain is worsened with weightbearing.  She has not identified any alleviating factors her cardiac risk factors include history of hypertension and type 2 diabetes   Past Medical History:   Diagnosis Date  . Chronic lower back pain   . Hyperlipemia   . Hypertension     Past Surgical History:  Procedure Laterality Date  . fluid removed from brain  12/20/2018    Family History  Problem Relation Age of Onset  . Diabetes Mother   . Hypertension Mother   . Cancer Father     Social History   Socioeconomic History  . Marital status: Legally Separated    Spouse name: Not on file  . Number of children: Not on file  . Years of education: Not on file  . Highest education level: Not on file  Occupational History  . Not on file  Social Needs  . Financial resource strain: Not on file  . Food insecurity    Worry: Not on file    Inability: Not on file  . Transportation needs    Medical: Not on file    Non-medical: Not on file  Tobacco Use  . Smoking status: Current Every Day Smoker    Packs/day: 1.00    Types: Cigarettes  . Smokeless tobacco: Never Used  Substance and Sexual Activity  . Alcohol use: No  . Drug use: No  . Sexual activity: Not Currently  Lifestyle  . Physical activity    Days per week: Not on file    Minutes per session: Not on file  . Stress: Not on file  Relationships  . Social Herbalist on phone: Not on file    Gets together: Not on file  Attends religious service: Not on file    Active member of club or organization: Not on file    Attends meetings of clubs or organizations: Not on file    Relationship status: Not on file  . Intimate partner violence    Fear of current or ex partner: Not on file    Emotionally abused: Not on file    Physically abused: Not on file    Forced sexual activity: Not on file  Other Topics Concern  . Not on file  Social History Narrative  . Not on file    ROS Review of Systems  Constitutional: Negative for diaphoresis and fatigue.  HENT: Negative.   Eyes: Negative.   Respiratory: Negative for apnea and chest tightness.   Cardiovascular: Positive for leg swelling.  Endocrine: Negative  for polydipsia, polyphagia and polyuria.  Musculoskeletal: Positive for back pain.  Allergic/Immunologic: Negative.   Neurological: Negative.   Hematological: Negative.   Psychiatric/Behavioral: Negative.     Objective:   Today's Vitals: BP (!) 97/58 (BP Location: Left Arm, Patient Position: Sitting, Cuff Size: Large)   Pulse 84   Temp 98.6 F (37 C) (Oral)   Resp 16   Ht 5\' 2"  (1.575 m)   Wt 203 lb (92.1 kg)   SpO2 99%   BMI 37.13 kg/m   Physical Exam Constitutional:      General: She is not in acute distress.    Appearance: She is not toxic-appearing.  HENT:     Head: Normocephalic.     Nose: Nose normal.  Eyes:     Pupils: Pupils are equal, round, and reactive to light.  Cardiovascular:     Rate and Rhythm: Normal rate.  Pulmonary:     Effort: Pulmonary effort is normal.     Breath sounds: Normal breath sounds.  Abdominal:     General: Bowel sounds are normal.  Musculoskeletal:     Right lower leg: 2+ Edema present.     Left lower leg: 2+ Edema present.  Psychiatric:        Attention and Perception: She is inattentive.        Speech: Speech normal.        Behavior: Behavior is not agitated or aggressive.        Thought Content: Thought content does not include homicidal or suicidal ideation.        Cognition and Memory: Cognition is impaired.     Comments: Patient had to be redirected several times during appointment.  Also, at times she seemed to fall asleep.     Assessment & Plan:   Problem List Items Addressed This Visit    None    Visit Diagnoses    Type 2 diabetes mellitus without complication, without long-term current use of insulin (Leah Barber)    -  Primary   Relevant Medications   metFORMIN (GLUCOPHAGE) 500 MG tablet   Other Relevant Orders   Ambulatory referral to Ophthalmology   HgB A1c (Completed)   CBC with Differential (Completed)   Comprehensive metabolic panel (Completed)   Attention deficit hyperactivity disorder (ADHD), unspecified ADHD  type       Relevant Orders   Ambulatory referral to Psychiatry   Pain and swelling of right lower leg       Relevant Orders   VAS [place] LOWER EXTREMITY VENOUS (DVT)   Candida infection          Outpatient Encounter Medications as of 05/20/2019  Medication Sig  . acetaZOLAMIDE (DIAMOX) 250  MG tablet Take 250 mg by mouth 3 (three) times daily.  Marland Kitchen albuterol (PROVENTIL HFA;VENTOLIN HFA) 108 (90 Base) MCG/ACT inhaler Inhale 2 puffs into the lungs every 4 (four) hours as needed for wheezing or shortness of breath.  Marland Kitchen amLODipine (NORVASC) 10 MG tablet Take 1 tablet (10 mg total) by mouth daily.  . baclofen (LIORESAL) 10 MG tablet Take 10 mg by mouth 3 (three) times daily.  . buprenorphine-naloxone (SUBOXONE) 8-2 mg SUBL SL tablet Place 1 tablet under the tongue daily.  . cetirizine (ZYRTEC) 10 MG tablet Take 1 tablet (10 mg total) by mouth daily.  . cloNIDine (CATAPRES) 0.1 MG tablet Take 0.1 mg by mouth 3 (three) times daily.  . DULoxetine (CYMBALTA) 60 MG capsule Take 1 capsule (60 mg total) by mouth 2 (two) times daily.  Marland Kitchen FLUoxetine (PROZAC) 20 MG capsule Take 20 mg by mouth daily.  . fluticasone (FLONASE) 50 MCG/ACT nasal spray Place 2 sprays into both nostrils daily.  . furosemide (LASIX) 20 MG tablet Take 20 mg by mouth.  . gabapentin (NEURONTIN) 400 MG capsule Take 2 capsules (800 mg total) by mouth 3 (three) times daily.  . hydrOXYzine (ATARAX/VISTARIL) 25 MG tablet Take 25 mg by mouth 3 (three) times daily as needed.  Marland Kitchen ibuprofen (ADVIL,MOTRIN) 200 MG tablet Take 400 mg by mouth every 6 (six) hours as needed for headache, mild pain or moderate pain.  . metFORMIN (GLUCOPHAGE) 500 MG tablet Take by mouth 2 (two) times daily with a meal.  . methylphenidate (RITALIN) 10 MG tablet Take 10 mg by mouth 2 (two) times daily.  . norethindrone (AYGESTIN) 5 MG tablet Take 10 mg by mouth daily.  Marland Kitchen omeprazole (PRILOSEC) 20 MG capsule Take 1 capsule (20 mg total) by mouth daily.  Marland Kitchen Spacer/Aero-Holding  Chambers (AEROCHAMBER PLUS WITH MASK) inhaler Use as instructed  . traZODone (DESYREL) 150 MG tablet Take by mouth at bedtime.  . [EXPIRED] fluconazole (DIFLUCAN) 150 MG tablet Take 1 tablet (150 mg total) by mouth once for 1 dose.  . [DISCONTINUED] hydrochlorothiazide (HYDRODIURIL) 25 MG tablet Take 1 tablet (25 mg total) by mouth daily.  . [DISCONTINUED] lamoTRIgine (LAMICTAL) 25 MG tablet Take 25 mg by mouth daily.  . [DISCONTINUED] meloxicam (MOBIC) 15 MG tablet Take 1 tablet (15 mg total) by mouth daily.  . [DISCONTINUED] metroNIDAZOLE (FLAGYL) 500 MG tablet Take 1 tablet (500 mg total) by mouth 2 (two) times daily with a meal. DO NOT CONSUME ALCOHOL WHILE TAKING THIS MEDICATION.   No facility-administered encounter medications on file as of 05/20/2019.    Type 2 diabetes mellitus without complication, without long-term current use of insulin (HCC) Hemoglobin a1C is 5.9. No medication changes warranted at this time. Continue Metformin 500 mg twice daily.  - Ambulatory referral to Ophthalmology - HgB A1c - CBC with Differential - Comprehensive metabolic panel  Attention deficit hyperactivity disorder (ADHD), unspecified ADHD type - Ambulatory referral to Psychiatry  Pain and swelling of right lower leg Schedule right venous dopplers to rule out DVT. Will follow up after reviewing results.  - VAS [place] LOWER EXTREMITY VENOUS (DVT); Future  Candida infection - fluconazole (DIFLUCAN) 150 MG tablet; Take 1 tablet (150 mg total) by mouth once for 1 dose.  Dispense: 1 tablet; Refill: 0  Essential hypertension Blood pressure controlled on Amlodipine, no medication changes on today.  - Continue medication, monitor blood pressure at home. Continue DASH diet. Reminder to go to the ER if any CP, SOB, nausea, dizziness, severe HA, changes  vision/speech, left arm numbness and tingling and jaw pain.  Polysubstance dependence including opioid type drug with complication, episodic abuse, with  unspecified complication [facility]) Patient currently on suboxone, she warrants a referral to pain management  Substance induced mood disorder [facility]) Patient referred to Estanislado Emms, LCSW for assistance with needs in the community including transportation and rehabilitation facilities  Follow-up:  Will follow up in 3 months for hypertension and DMII  [name]  APRN, MSN, FNP-C [facility] [address] [place], [place] [address] [phone]

## 2019-05-22 ENCOUNTER — Ambulatory Visit (HOSPITAL_COMMUNITY)
Admission: RE | Admit: 2019-05-22 | Discharge: 2019-05-22 | Disposition: A | Discharge status: Home / Self Care | Payer: Medicaid Other | Source: Ambulatory Visit | Attending: Family Medicine | Admitting: Family Medicine

## 2019-05-22 ENCOUNTER — Other Ambulatory Visit: Payer: Self-pay

## 2019-05-22 DIAGNOSIS — M79661 Pain in right lower leg: Secondary | ICD-10-CM | POA: Insufficient documentation

## 2019-05-22 DIAGNOSIS — M7989 Other specified soft tissue disorders: Secondary | ICD-10-CM | POA: Diagnosis present

## 2019-05-22 NOTE — Progress Notes (Signed)
Lower extremity venous has been completed.   Preliminary results in CV Proc.   Leah Barber 05/22/2019 3:41 PM

## 2019-05-23 ENCOUNTER — Telehealth: Payer: Self-pay

## 2019-05-23 NOTE — Telephone Encounter (Signed)
Called and spoke with patient, advised that no evidence of DVT noted on doppler study. Advised that labs are within normal range and Hgba1c was 5.9. No medication changes are needed at this time. Advised that we will need to follow up in 3 months. She has a 1 month follow up and asked if she can keep this for additional problems. Thanks!

## 2019-05-23 NOTE — Telephone Encounter (Signed)
-----   Message from Dorena Dew, [place] sent at 05/23/2019  6:33 AM EDT ----- Regarding: lab and doppler results Please inform patient that there was no evidence of deep vein thrombosis or obstruction on doppler study. Also, laboratory values are within normal range. Hemoglobin a1C is 5.9, no medication changes warranted at this time. She will need to sign a medical records release so that I can review her previous echocardiogram.   She can schedule a 3 month follow up for DMII and HTN.    [name]  APRN, MSN, FNP-C [facility] [address] [place], [place] [address] [phone]

## 2019-05-28 ENCOUNTER — Telehealth: Payer: Self-pay

## 2019-05-28 ENCOUNTER — Other Ambulatory Visit: Payer: Self-pay

## 2019-05-28 DIAGNOSIS — R1030 Lower abdominal pain, unspecified: Secondary | ICD-10-CM

## 2019-05-28 MED ORDER — FLUOXETINE HCL 20 MG PO CAPS: 20.0000 mg | ORAL_CAPSULE | Freq: Every day | ORAL | 3 refills | Status: DC

## 2019-05-28 MED ORDER — OMEPRAZOLE 20 MG PO CPDR: 20.0000 mg | DELAYED_RELEASE_CAPSULE | Freq: Every day | ORAL | 3 refills | Status: DC

## 2019-05-28 MED FILL — OMEPRAZOLE 20 MG CAP: 20 | 30 days supply | Qty: 30 | Fill #0

## 2019-05-28 NOTE — Telephone Encounter (Signed)
Called, no answer. Voicemail was full.  

## 2019-05-28 NOTE — Telephone Encounter (Signed)
-----   Message from Dorena Dew, [place] sent at 05/28/2019 11:10 AM EDT ----- Regarding: Refills Please inform Ms. Hopewell that I do not prescribe Ritalin, nor will I send in refills for another provider. Contact the original prescriber for refills. Also, I sent a referral to establish with psychiatrist. Please inquire about referral.    [name]  APRN, MSN, FNP-C [facility] [address] [place], [place] [address] [phone]

## 2019-05-28 NOTE — Telephone Encounter (Signed)
Called and informed patient that rx for ritalin will not be prescribed. Thanks!

## 2019-05-28 NOTE — Telephone Encounter (Signed)
Patient is calling asking for a refill on Ritalin. Please advise. She says she can't just go off of this and she said you would refill it.

## 2019-06-06 ENCOUNTER — Other Ambulatory Visit: Payer: Self-pay

## 2019-06-06 MED ORDER — DULOXETINE HCL 60 MG PO CPEP: 60.0000 mg | ORAL_CAPSULE | Freq: Two times a day (BID) | ORAL | 3 refills | Status: DC

## 2019-06-06 MED ORDER — FUROSEMIDE 20 MG PO TABS: 20.0000 mg | ORAL_TABLET | Freq: Every day | ORAL | 3 refills | Status: DC

## 2019-06-09 ENCOUNTER — Emergency Department (HOSPITAL_COMMUNITY)
Admission: EM | Admit: 2019-06-09 | Discharge: 2019-06-09 | Disposition: A | Discharge status: Home / Self Care | Payer: Medicaid Other | Attending: Emergency Medicine | Admitting: Emergency Medicine

## 2019-06-09 ENCOUNTER — Other Ambulatory Visit: Payer: Self-pay

## 2019-06-09 ENCOUNTER — Encounter (HOSPITAL_COMMUNITY): Payer: Self-pay | Admitting: *Deleted

## 2019-06-09 ENCOUNTER — Emergency Department (HOSPITAL_COMMUNITY): Payer: Medicaid Other

## 2019-06-09 DIAGNOSIS — M7989 Other specified soft tissue disorders: Secondary | ICD-10-CM | POA: Diagnosis present

## 2019-06-09 DIAGNOSIS — R600 Localized edema: Secondary | ICD-10-CM | POA: Insufficient documentation

## 2019-06-09 DIAGNOSIS — Z79899 Other long term (current) drug therapy: Secondary | ICD-10-CM | POA: Insufficient documentation

## 2019-06-09 DIAGNOSIS — E119 Type 2 diabetes mellitus without complications: Secondary | ICD-10-CM | POA: Diagnosis not present

## 2019-06-09 DIAGNOSIS — I10 Essential (primary) hypertension: Secondary | ICD-10-CM | POA: Insufficient documentation

## 2019-06-09 DIAGNOSIS — R609 Edema, unspecified: Secondary | ICD-10-CM

## 2019-06-09 DIAGNOSIS — F17210 Nicotine dependence, cigarettes, uncomplicated: Secondary | ICD-10-CM | POA: Diagnosis not present

## 2019-06-09 DIAGNOSIS — Z7984 Long term (current) use of oral hypoglycemic drugs: Secondary | ICD-10-CM | POA: Diagnosis not present

## 2019-06-09 HISTORY — DX: Type 2 diabetes mellitus without complications: E11.9

## 2019-06-09 HISTORY — DX: Benign intracranial hypertension: G93.2

## 2019-06-09 MED FILL — DULoxetine HCL 60 MG CPEP: 60 | 30 days supply | Qty: 60 | Fill #0

## 2019-06-09 NOTE — ED Notes (Signed)
Pt falling asleep while sitting in the wheelchair and continues to lean forward; will respond with stimulation

## 2019-06-09 NOTE — ED Triage Notes (Signed)
Pt reports recently moving here from [place]; has envelope of records with her.   C/o lower extremity swelling; R worse than L; c/o R second digit discoloration and pain. No wounds or discoloration noted to R foot.   Pt very drowsy and slow to get information from during triage. Denies headache, cp, or sob

## 2019-06-09 NOTE — ED Provider Notes (Signed)
[facility] EMERGENCY DEPARTMENT Provider Note   CSN: [id number] Arrival date & time: 06/09/19  2025     History   Chief Complaint Chief Complaint  Patient presents with  . Leg Swelling  . Toe Pain    HPI Leah Barber is a 43 y.o. female.     Patient with history of polysubstance abuse, HTN, DM, HLD, IIH, chronic low back pain presents with concern for painful R>L LE edema x 2 months. She feels she is now seeing some discoloration of her 2nd right toe and around her ankle. She reports she is new to the area from [place] and has not associated with a primary care yet. No fever. She states she has SOB but this is ongoing and unchanged. No chest pain, cough, fever, nausea or vomiting.   The history is provided by the patient. No language interpreter was used.  Toe Pain Associated symptoms include shortness of breath (Chronic, unchanged). Pertinent negatives include no chest pain and no abdominal pain.    Past Medical History:  Diagnosis Date  . Chronic lower back pain   . Diabetes mellitus without complication ([place])   . Hyperlipemia   . Hypertension   . IIH (idiopathic intracranial hypertension)     Patient Active Problem List   Diagnosis Date Noted  . Polysubstance dependence including opioid type drug with complication, episodic abuse, with unspecified complication ([place]) [id number]  . Depression 09/13/2015  . Cluster B personality disorder ([place]) 05/15/2014  . Dependence on nicotine from cigarettes 05/15/2014  . Substance induced mood disorder ([place]) 05/15/2014  . Benzodiazepine abuse ([place]) 09/20/2011  . Cannabis abuse 05/04/2011  . Cocaine abuse ([place]) 05/04/2011  . Polysubstance dependence ([place]) 05/04/2011  . Candidiasis of vagina 04/30/2011  . Chronic pain in right shoulder 04/30/2011  . Depressive disorder, not elsewhere classified 04/30/2011  . Otitis media, acute 04/30/2011    Past Surgical History:  Procedure Laterality Date  . fluid  removed from brain  12/20/2018     OB History   No obstetric history on file.      Home Medications    Prior to Admission medications   Medication Sig Start Date End Date Taking? Authorizing Provider  acetaZOLAMIDE (DIAMOX) 250 MG tablet Take 250 mg by mouth 3 (three) times daily.    [provider]  albuterol (PROVENTIL HFA;VENTOLIN HFA) 108 (90 Base) MCG/ACT inhaler Inhale 2 puffs into the lungs every 4 (four) hours as needed for wheezing or shortness of breath. 06/20/17   Scot Jun, FNP  amLODipine (NORVASC) 10 MG tablet Take 1 tablet (10 mg total) by mouth daily. 06/20/17   Scot Jun, FNP  baclofen (LIORESAL) 10 MG tablet Take 10 mg by mouth 3 (three) times daily.    [provider]  buprenorphine-naloxone (SUBOXONE) 8-2 mg SUBL SL tablet Place 1 tablet under the tongue daily.    [provider]  cetirizine (ZYRTEC) 10 MG tablet Take 1 tablet (10 mg total) by mouth daily. 06/20/17   Scot Jun, FNP  cloNIDine (CATAPRES) 0.1 MG tablet Take 0.1 mg by mouth 3 (three) times daily.    [provider]  DULoxetine (CYMBALTA) 60 MG capsule Take 1 capsule (60 mg total) by mouth 2 (two) times daily. 06/06/19   Dorena Dew, FNP  FLUoxetine (PROZAC) 20 MG capsule Take 1 capsule (20 mg total) by mouth daily. 05/28/19   Dorena Dew, FNP  fluticasone (FLONASE) 50 MCG/ACT nasal spray Place 2 sprays into both  nostrils daily. 06/20/17   Scot Jun, FNP  furosemide (LASIX) 20 MG tablet Take 1 tablet (20 mg total) by mouth daily. 06/06/19   Dorena Dew, FNP  gabapentin (NEURONTIN) 400 MG capsule Take 2 capsules (800 mg total) by mouth 3 (three) times daily. 06/20/17   Scot Jun, FNP  hydrOXYzine (ATARAX/VISTARIL) 25 MG tablet Take 25 mg by mouth 3 (three) times daily as needed.    [provider]  ibuprofen (ADVIL,MOTRIN) 200 MG tablet Take 400 mg by mouth every 6 (six) hours as needed for headache,  mild pain or moderate pain.    [provider]  metFORMIN (GLUCOPHAGE) 500 MG tablet Take by mouth 2 (two) times daily with a meal.    [provider]  methylphenidate (RITALIN) 10 MG tablet Take 10 mg by mouth 2 (two) times daily.    [provider]  norethindrone (AYGESTIN) 5 MG tablet Take 10 mg by mouth daily.    [provider]  omeprazole (PRILOSEC) 20 MG capsule Take 1 capsule (20 mg total) by mouth daily. 05/28/19   Dorena Dew, FNP  Spacer/Aero-Holding Chambers (AEROCHAMBER PLUS WITH MASK) inhaler Use as instructed 12/11/16   Muthersbaugh, Jarrett Soho, PA-C  traZODone (DESYREL) 150 MG tablet Take by mouth at bedtime.    [provider]    Family History Family History  Problem Relation Age of Onset  . Diabetes Mother   . Hypertension Mother   . Cancer Father     Social History Social History   Tobacco Use  . Smoking status: Current Every Day Smoker    Packs/day: 1.00    Types: Cigarettes  . Smokeless tobacco: Never Used  Substance Use Topics  . Alcohol use: No  . Drug use: No     Allergies   Hydrocodone and Morphine   Review of Systems Review of Systems  Constitutional: Negative for diaphoresis and fever.  HENT: Negative.   Respiratory: Positive for shortness of breath (Chronic, unchanged).   Cardiovascular: Positive for leg swelling. Negative for chest pain.  Gastrointestinal: Negative for abdominal pain and nausea.  Musculoskeletal:       See HPI.  Skin: Positive for color change.  Neurological: Negative for weakness and numbness.     Physical Exam Updated Vital Signs BP 118/82   Pulse 78   Temp 98.4 F (36.9 C) (Oral)   Resp 20   SpO2 100%   Physical Exam Vitals signs and nursing note reviewed.  Constitutional:      Appearance: She is well-developed.  HENT:     Head: Normocephalic.  Neck:     Musculoskeletal: Normal range of motion and neck supple.  Cardiovascular:     Rate and Rhythm: Normal  rate and regular rhythm.  Pulmonary:     Effort: Pulmonary effort is normal.     Breath sounds: Normal breath sounds. No wheezing, rhonchi or rales.  Abdominal:     General: Bowel sounds are normal.     Palpations: Abdomen is soft.     Tenderness: There is no abdominal tenderness. There is no guarding or rebound.  Musculoskeletal: Normal range of motion.     Comments: R>L LE mild edema. Distal pulses are present. Temperature is warm, equal bilaterally. LE's are diffusely tender.   Skin:    General: Skin is warm and dry.     Findings: No rash.     Comments: No visualized color change of LE's, specifically, no "blackness" of toes.   Neurological:  Mental Status: She is alert and oriented to person, place, and time.     Sensory: No sensory deficit.      ED Treatments / Results  Labs (all labs ordered are listed, but only abnormal results are displayed) Labs Reviewed - No data to display  EKG None  Radiology No results found.  Procedures Procedures (including critical care time)  Medications Ordered in ED Medications - No data to display   Initial Impression / Assessment and Plan / ED Course  I have reviewed the triage vital signs and the nursing notes.  Pertinent labs & imaging results that were available during my care of the patient were reviewed by me and considered in my medical decision making (see chart for details).   Patient to ED with LE edema and pain, c/o "black toe on right". Sxs x 2 months. SOB, chronic and unchanged. No fever.   She denies history of blood clots. Symptoms started prior to travel from [place] by airplane and is bilateral. Doubt DVT. Also, negative Rt LE DVT study on 05/22/19.  No evidence of infection. No vascular compromise. Doubt CHF given chronic SOB, no history, but will get a CXR to insure no pulmonary congestion/edema. VSS, no hypoxia.   PDMD checked. Patient's score is 450 secondary to Suboxone treatment, last Rx filled 06/03/19 by  MD in [place]. Prescription filled every 7 days so she would be due now.   The patient reports being unable to establish care in [place] due to Medicaid issues. Per chart review, she has been seen by Cammie Sickle who has provided a comprehensive primary appointment and appropriate referrals for psychiatry and social work for ongoing care and community resources. Will refer back to her for further outpatient evaluation.      Final Clinical Impressions(s) / ED Diagnoses   Final diagnoses:  None   1. Lower extremity edema  ED Discharge Orders    None       Charlann Lange, PA-C 06/09/19 [facility], [place], DO 06/10/19 0018

## 2019-06-09 NOTE — ED Notes (Signed)
Patient verbalizes understanding of discharge instructions. Opportunity for questioning and answers were provided. Armband removed by staff, pt discharged from ED via wheelchair with family.  

## 2019-06-09 NOTE — Discharge Instructions (Addendum)
Our records show that you have established care with Leah Barber who has provided comprehensive care, appropriate referrals for ongoing psychiatric care and pain management, as well as DVT study (done 10/1 and was negative), and connection with social work for [employer]. It is recommended that you return to her for recheck of current issued with lower extremity swelling and pain.  A doppler study was discussed tonight as part of your evaluation, however, a repeat doppler study is not necessary at this time as the one you had on 10/01 was negative.

## 2019-06-12 ENCOUNTER — Other Ambulatory Visit: Payer: Self-pay | Admitting: Family Medicine

## 2019-06-12 MED ORDER — BACLOFEN 10 MG PO TABS: 10.0000 mg | ORAL_TABLET | Freq: Three times a day (TID) | ORAL | 1 refills | Status: DC

## 2019-06-12 MED ORDER — IBUPROFEN 600 MG PO TABS: 600.0000 mg | ORAL_TABLET | Freq: Four times a day (QID) | ORAL | 2 refills | Status: DC | PRN

## 2019-06-12 MED FILL — IBUPROFEN 600 MG TABLET: 600 | 15 days supply | Qty: 60 | Fill #0

## 2019-06-12 MED FILL — BACLOFEN 10 MG TABLET: 10 | 30 days supply | Qty: 90 | Fill #0

## 2019-06-12 MED FILL — OMEPRAZOLE 20 MG CAP: 20 | 30 days supply | Qty: 30 | Fill #0

## 2019-06-12 NOTE — Progress Notes (Signed)
Meds ordered this encounter  Medications  . baclofen (LIORESAL) 10 MG tablet    Sig: Take 1 tablet (10 mg total) by mouth 3 (three) times daily.    Dispense:  90 each    Refill:  1    Order Specific Question:   Supervising Provider    Answer:   Tresa Garter [id number]  . ibuprofen (ADVIL) 600 MG tablet    Sig: Take 1 tablet (600 mg total) by mouth every 6 (six) hours as needed for headache, mild pain or moderate pain.    Dispense:  60 tablet    Refill:  2    Order Specific Question:   Supervising Provider    Answer:   Tresa Garter [id number]     [name]  APRN, MSN, FNP-C Patient [facility] [address] [place], [place] [address] [phone]

## 2019-06-16 ENCOUNTER — Telehealth: Payer: Self-pay | Admitting: Family Medicine

## 2019-06-16 ENCOUNTER — Other Ambulatory Visit: Payer: Self-pay | Admitting: Family Medicine

## 2019-06-16 MED ORDER — CLONIDINE HCL 0.1 MG PO TABS: 0.1000 mg | ORAL_TABLET | Freq: Three times a day (TID) | ORAL | 1 refills | Status: DC

## 2019-06-16 MED ORDER — NORETHINDRONE ACETATE 5 MG PO TABS: 10.0000 mg | ORAL_TABLET | Freq: Every day | ORAL | 1 refills | Status: DC

## 2019-06-16 NOTE — Progress Notes (Signed)
Meds ordered this encounter  Medications  . cloNIDine (CATAPRES) 0.1 MG tablet    Sig: Take 1 tablet (0.1 mg total) by mouth 3 (three) times daily.    Dispense:  60 tablet    Refill:  1    Order Specific Question:   Supervising Provider    Answer:   Tresa Garter [id number]  . norethindrone (AYGESTIN) 5 MG tablet    Sig: Take 2 tablets (10 mg total) by mouth daily.    Dispense:  30 tablet    Refill:  1    Order Specific Question:   Supervising Provider    Answer:   Tresa Garter [id number]    [name]  APRN, MSN, FNP-C Patient Leah Barber [address] [place], [place] [address] [phone]

## 2019-06-17 ENCOUNTER — Other Ambulatory Visit: Payer: Self-pay | Admitting: Internal Medicine

## 2019-06-17 MED ORDER — NORETHINDRONE ACETATE 5 MG PO TABS: 10.0000 mg | ORAL_TABLET | Freq: Every day | ORAL | 1 refills | Status: DC

## 2019-06-17 MED ORDER — CLONIDINE HCL 0.1 MG PO TABS: 0.1000 mg | ORAL_TABLET | Freq: Three times a day (TID) | ORAL | 1 refills | Status: DC

## 2019-06-17 NOTE — Telephone Encounter (Signed)
Refilled

## 2019-06-23 ENCOUNTER — Other Ambulatory Visit: Payer: Self-pay | Admitting: Family Medicine

## 2019-06-24 ENCOUNTER — Ambulatory Visit: Payer: Medicaid Other | Admitting: Family Medicine

## 2019-07-01 ENCOUNTER — Ambulatory Visit: Payer: Medicaid Other | Admitting: Family Medicine

## 2019-07-02 ENCOUNTER — Ambulatory Visit (INDEPENDENT_AMBULATORY_CARE_PROVIDER_SITE_OTHER): Payer: Medicaid Other | Admitting: Family Medicine

## 2019-07-02 ENCOUNTER — Other Ambulatory Visit: Payer: Self-pay

## 2019-07-02 VITALS — BP 130/82 | HR 84 | Temp 99.1°F | Resp 16 | Ht 62.0 in | Wt 204.0 lb

## 2019-07-02 DIAGNOSIS — J4520 Mild intermittent asthma, uncomplicated: Secondary | ICD-10-CM

## 2019-07-02 DIAGNOSIS — F1920 Other psychoactive substance dependence, uncomplicated: Secondary | ICD-10-CM

## 2019-07-02 DIAGNOSIS — Z8669 Personal history of other diseases of the nervous system and sense organs: Secondary | ICD-10-CM

## 2019-07-02 DIAGNOSIS — J42 Unspecified chronic bronchitis: Secondary | ICD-10-CM

## 2019-07-02 DIAGNOSIS — Z86018 Personal history of other benign neoplasm: Secondary | ICD-10-CM

## 2019-07-02 DIAGNOSIS — R519 Headache, unspecified: Secondary | ICD-10-CM

## 2019-07-02 DIAGNOSIS — Z9109 Other allergy status, other than to drugs and biological substances: Secondary | ICD-10-CM

## 2019-07-02 DIAGNOSIS — Z8742 Personal history of other diseases of the female genital tract: Secondary | ICD-10-CM

## 2019-07-02 DIAGNOSIS — L304 Erythema intertrigo: Secondary | ICD-10-CM

## 2019-07-02 DIAGNOSIS — F319 Bipolar disorder, unspecified: Secondary | ICD-10-CM

## 2019-07-02 MED ORDER — NORETHINDRONE ACETATE 5 MG PO TABS: 10.0000 mg | ORAL_TABLET | Freq: Every day | ORAL | 3 refills | Status: DC

## 2019-07-02 MED ORDER — FLUTICASONE PROPIONATE 50 MCG/ACT NA SUSP: 2.0000 | Freq: Every day | NASAL | 2 refills | Status: DC

## 2019-07-02 NOTE — Progress Notes (Signed)
Established Patient Office Visit  Subjective:  Patient ID: Leah Barber, female    DOB: 1976/07/04  Age: 43 y.o. MRN: [id number]  CC:  Chief Complaint  Patient presents with  . Follow-up    needs ob/gyn and neurology referral   . Medication Refill    needs refill on med to stop bleeding  . Rash    under stomach    HPI Rodman Comp, 43 year old female with a medical history significant for hypertension, type 2 diabetes mellitus, polysubstance abuse, hypertension, bipolar disorder, history of cocaine abuse, history of personality disorder, history of attention deficit disorder, benzodiazepine abuse, and chronic pain syndrome presents requesting an OB/GYN and neurology referral.  She is also complaining of an itchy rash to abdominal folds. It appears that patient has relocated on different occasions and has an extensive medical history.  Patient is difficult to follow and is of poor historian.  She reports a history of idiopathic intracranial hypertension with increased intraocular pressure and has been taking Diamox consistently.  Patient was previously followed by neurology in [place].  Medical records have not been reviewed at this time.  Patient endorses frequent headache pain and occasional dizziness.  She denies paresthesias, abnormal gait, blurred vision, or present headache.  Patient also reports a history of uterine fibroids.  She endorses history of menorrhagia.  Patient was placed on norethindrone daily to control uterine bleeding.  She states that medication has been effective.  She has been out over the past week and is concerned about uterine bleeding and has started to spot.  She is not under the care of an OB/GYN at this time.  She says that it was previously recommended to have fibroids removed, but she declined at that time.  She endorses spotting.  She denies abdominal pain, pelvic pain dysuria, abnormal discharge, dyspareunia, or menorrhagia.  Patient is complaining of in  itching rash underneath abdominal folds for greater than 1 month.  She says that area is "moist" most of the time.  She has tried cornstarch and nystatin without relief.  Patient has not changed laundry detergent, soap, or used any new products.  Past Medical History:  Diagnosis Date  . Chronic lower back pain   . Diabetes mellitus without complication (HCC)   . Hyperlipemia   . Hypertension   . IIH (idiopathic intracranial hypertension)     Past Surgical History:  Procedure Laterality Date  . fluid removed from brain  12/20/2018    Family History  Problem Relation Age of Onset  . Diabetes Mother   . Hypertension Mother   . Cancer Father     Social History   Socioeconomic History  . Marital status: Legally Separated    Spouse name: Not on file  . Number of children: Not on file  . Years of education: Not on file  . Highest education level: Not on file  Occupational History  . Not on file  Social Needs  . Financial resource strain: Not on file  . Food insecurity    Worry: Not on file    Inability: Not on file  . Transportation needs    Medical: Not on file    Non-medical: Not on file  Tobacco Use  . Smoking status: Current Every Day Smoker    Packs/day: 1.00    Types: Cigarettes  . Smokeless tobacco: Never Used  Substance and Sexual Activity  . Alcohol use: No  . Drug use: No  . Sexual activity: Not Currently  Lifestyle  .  Physical activity    Days per week: Not on file    Minutes per session: Not on file  . Stress: Not on file  Relationships  . Social Musician on phone: Not on file    Gets together: Not on file    Attends religious service: Not on file    Active member of club or organization: Not on file    Attends meetings of clubs or organizations: Not on file    Relationship status: Not on file  . Intimate partner violence    Fear of current or ex partner: Not on file    Emotionally abused: Not on file    Physically abused: Not on file     Forced sexual activity: Not on file  Other Topics Concern  . Not on file  Social History Narrative  . Not on file    Outpatient Medications Prior to Visit  Medication Sig Dispense Refill  . acetaZOLAMIDE (DIAMOX) 250 MG tablet Take 250 mg by mouth 3 (three) times daily.    Marland Kitchen albuterol (PROVENTIL HFA;VENTOLIN HFA) 108 (90 Base) MCG/ACT inhaler Inhale 2 puffs into the lungs every 4 (four) hours as needed for wheezing or shortness of breath. 1 Inhaler 3  . amLODipine (NORVASC) 10 MG tablet Take 1 tablet (10 mg total) by mouth daily. 90 tablet 1  . baclofen (LIORESAL) 10 MG tablet Take 1 tablet (10 mg total) by mouth 3 (three) times daily. 90 each 1  . Brexpiprazole (REXULTI PO) Take by mouth.    . Buprenorphine HCl-Naloxone HCl 8-2 MG FILM Place 1 Film under the tongue 2 (two) times daily.     . cetirizine (ZYRTEC) 10 MG tablet Take 1 tablet (10 mg total) by mouth daily. 30 tablet 11  . cloNIDine (CATAPRES) 0.1 MG tablet Take 1 tablet (0.1 mg total) by mouth 3 (three) times daily. 60 tablet 1  . DULoxetine (CYMBALTA) 60 MG capsule Take 1 capsule (60 mg total) by mouth 2 (two) times daily. 60 capsule 3  . FLUoxetine (PROZAC) 20 MG capsule Take 1 capsule (20 mg total) by mouth daily. 30 capsule 3  . furosemide (LASIX) 20 MG tablet Take 1 tablet (20 mg total) by mouth daily. 30 tablet 3  . gabapentin (NEURONTIN) 400 MG capsule Take 2 capsules (800 mg total) by mouth 3 (three) times daily. 90 capsule 3  . hydrOXYzine (ATARAX/VISTARIL) 25 MG tablet Take 25 mg by mouth 3 (three) times daily as needed.    Marland Kitchen ibuprofen (ADVIL) 600 MG tablet Take 1 tablet (600 mg total) by mouth every 6 (six) hours as needed for headache, mild pain or moderate pain. 60 tablet 2  . metFORMIN (GLUCOPHAGE) 500 MG tablet Take by mouth 2 (two) times daily with a meal.    . methylphenidate (RITALIN) 10 MG tablet Take 10 mg by mouth 2 (two) times daily.    Marland Kitchen omeprazole (PRILOSEC) 20 MG capsule Take 1 capsule (20 mg total)  by mouth daily. 30 capsule 3  . Spacer/Aero-Holding Chambers (AEROCHAMBER PLUS WITH MASK) inhaler Use as instructed 1 each 2  . traZODone (DESYREL) 150 MG tablet Take by mouth at bedtime.    . fluticasone (FLONASE) 50 MCG/ACT nasal spray Place 2 sprays into both nostrils daily. 9.9 g 2  . norethindrone (AYGESTIN) 5 MG tablet TAKE 2 TABLETS BY MOUTH EVERY DAY 30 tablet 1   No facility-administered medications prior to visit.     Allergies  Allergen Reactions  . Hydrocodone  Nausea And Vomiting    "stomach pain"  . Morphine Other (See Comments)    ROS Review of Systems  Constitutional: Negative for activity change, appetite change and fatigue.  HENT: Negative.   Eyes: Negative.   Cardiovascular: Negative for chest pain and leg swelling.  Gastrointestinal: Negative.   Endocrine: Negative.   Genitourinary: Positive for vaginal bleeding. Negative for difficulty urinating and vaginal discharge.  Musculoskeletal: Positive for arthralgias.  Skin: Positive for rash.  Neurological: Positive for dizziness, weakness, light-headedness, numbness and headaches.  Hematological: Negative.   Psychiatric/Behavioral: Negative.       Objective:    Physical Exam  Constitutional: She is oriented to person, place, and time. She appears well-developed and well-nourished. No distress.  HENT:  Head: Normocephalic and atraumatic.  Right Ear: External ear normal.  Left Ear: External ear normal.  Nose: Nose normal.  Mouth/Throat: Oropharynx is clear and moist.  Eyes: Pupils are equal, round, and reactive to light.  Neck: Normal range of motion. Neck supple.  Cardiovascular: Normal rate, regular rhythm and normal heart sounds.  Pulmonary/Chest: Effort normal and breath sounds normal.  Abdominal: Soft. Bowel sounds are normal. She exhibits no distension.  Neurological: She is alert and oriented to person, place, and time. She has normal reflexes. No cranial nerve deficit. Coordination normal.  Skin:   Increased moisture and putrid odor to abdominal folds  Psychiatric: Her affect is labile. Her speech is slurred. She is not agitated. She expresses impulsivity and inappropriate judgment. She expresses no homicidal ideation.    BP 130/82 (BP Location: Left Arm, Patient Position: Sitting, Cuff Size: Large)   Pulse 84   Temp 99.1 F (37.3 C) (Oral)   Resp 16   Ht 5\' 2"  (1.575 m)   Wt 204 lb (92.5 kg)   SpO2 99%   BMI 37.31 kg/m  Wt Readings from Last 3 Encounters:  07/02/19 204 lb (92.5 kg)  05/20/19 203 lb (92.1 kg)  06/06/17 200 lb 12.8 oz (91.1 kg)     Health Maintenance Due  Topic Date Due  . TETANUS/TDAP  09/21/1992  . INFLUENZA VACCINE  03/22/2019    There are no preventive care reminders to display for this patient.  Lab Results  Component Value Date   TSH 1.10 11/23/2016   Lab Results  Component Value Date   WBC 8.7 05/20/2019   HGB 12.4 05/20/2019   HCT 38.8 05/20/2019   MCV 93 05/20/2019   PLT 281 05/20/2019   Lab Results  Component Value Date   NA 142 05/20/2019   K 3.8 05/20/2019   CO2 16 (L) 05/20/2019   GLUCOSE 73 05/20/2019   BUN 12 05/20/2019   CREATININE 0.99 05/20/2019   BILITOT 0.3 05/20/2019   ALKPHOS 93 05/20/2019   AST 22 05/20/2019   ALT 26 05/20/2019   PROT 6.9 05/20/2019   ALBUMIN 4.6 05/20/2019   CALCIUM 8.8 05/20/2019   ANIONGAP 6 12/29/2016   Lab Results  Component Value Date   CHOL 216 (H) 11/23/2016   Lab Results  Component Value Date   HDL 47 (L) 11/23/2016   Lab Results  Component Value Date   LDLCALC 151 (H) 11/23/2016   Lab Results  Component Value Date   TRIG 92 11/23/2016   Lab Results  Component Value Date   CHOLHDL 4.6 11/23/2016   Lab Results  Component Value Date   HGBA1C 5.9 (A) 05/20/2019      Assessment & Plan:   Problem List Items Addressed This Visit  Respiratory   Chronic bronchitis (HCC)     Other   Polysubstance dependence including opioid type drug with complication,  episodic abuse, with unspecified complication (HCC)    Other Visit Diagnoses    Dizziness    -  Primary   Relevant Orders   Ambulatory referral to Neurology   Nonintractable headache, unspecified chronicity pattern, unspecified headache type       Relevant Orders   Ambulatory referral to Neurology   History of glaucoma       H/O menorrhagia       Relevant Medications   norethindrone (AYGESTIN) 5 MG tablet   Other Relevant Orders   Ambulatory referral to Gynecology   History of uterine fibroid       Relevant Orders   Ambulatory referral to Gynecology   Mild intermittent asthma, unspecified whether complicated       Environmental allergies       Relevant Medications   fluticasone (FLONASE) 50 MCG/ACT nasal spray      Meds ordered this encounter  Medications  . DISCONTD: norethindrone (AYGESTIN) 5 MG tablet    Sig: Take 2 tablets (10 mg total) by mouth daily.    Dispense:  60 tablet    Refill:  3    Order Specific Question:   Supervising Provider    Answer:   Quentin Angst [id number]  . norethindrone (AYGESTIN) 5 MG tablet    Sig: Take 2 tablets (10 mg total) by mouth daily.    Dispense:  60 tablet    Refill:  3    Order Specific Question:   Supervising Provider    Answer:   Quentin Angst [id number]  . fluticasone (FLONASE) 50 MCG/ACT nasal spray    Sig: Place 2 sprays into both nostrils daily.    Dispense:  9.9 g    Refill:  2    Order Specific Question:   Supervising Provider    Answer:   Quentin Angst [id number]  . miconazole (MICOTIN) 2 % powder    Sig: Apply topically as needed for itching.    Dispense:  70 g    Refill:  0    Order Specific Question:   Supervising Provider    Answer:   Quentin Angst [id number]  . fluconazole (DIFLUCAN) 150 MG tablet    Sig: Take 1 tablet (150 mg total) by mouth once for 1 dose.    Dispense:  2 tablet    Refill:  0    Order Specific Question:   Supervising Provider    Answer:   Quentin Angst  [id number]    Idiopathic intercranial hypertension Patient has a long history of idiopathic intracranial hypertension along with increased IOP.  She was previously followed by neurology in [place], and has been lost to follow-up over the past several months.  We will continue Diamox at current dose and defer to neurology for further work-up and evaluation.   - Ambulatory referral to Neurology  Nonintractable headache, unspecified chronicity pattern, unspecified headache type - Ambulatory referral to Neurology   History of glaucoma Referral has been sent to ophthalmology, patient to reschedule appointment.  Chronic bronchitis, unspecified chronic bronchitis type (HCC) Albuterol inhaler every 6 hours as needed for wheezing, shortness of breath, and/or persistent cough  Polysubstance dependence including opioid type drug with complication, episodic abuse, with unspecified complication (HCC) Continue to follow-up with outpatient rehabilitation.  Patient is currently on Suboxone.  H/O menorrhagia - Ambulatory referral to Gynecology - norethindrone (AYGESTIN) 5  MG tablet; Take 2 tablets (10 mg total) by mouth daily.  Dispense: 60 tablet; Refill: 3  History of uterine fibroid Will continue norethindrone for abnormal uterine bleeding.  Defer to gynecology for further work-up and evaluation of uterine fibroid.  I will not order transvaginal ultrasound at this time. - Ambulatory referral to Gynecology  Mild intermittent asthma, unspecified whether complicated No medication changes warranted at this time   Environmental allergies - fluticasone (FLONASE) 50 MCG/ACT nasal spray; Place 2 sprays into both nostrils daily.  Dispense: 9.9 g; Refill: 2   Intertrigo - miconazole (MICOTIN) 2 % powder; Apply topically as needed for itching.  Dispense: 70 g; Refill: 0 - fluconazole (DIFLUCAN) 150 MG tablet; Take 1 tablet (150 mg total) by mouth once for 1 dose.  Dispense: 2 tablet; Refill:  0    Follow-up:  Follow up in 3 months for chronic conditions   Christie Copley Rennis Petty  APRN, MSN, FNP-C [facility] [address] [place], [place] [address] [phone]

## 2019-07-07 ENCOUNTER — Encounter: Payer: Self-pay | Admitting: Family Medicine

## 2019-07-07 ENCOUNTER — Telehealth: Payer: Self-pay | Admitting: Family Medicine

## 2019-07-07 DIAGNOSIS — L304 Erythema intertrigo: Secondary | ICD-10-CM | POA: Insufficient documentation

## 2019-07-07 DIAGNOSIS — Z86018 Personal history of other benign neoplasm: Secondary | ICD-10-CM | POA: Insufficient documentation

## 2019-07-07 DIAGNOSIS — D219 Benign neoplasm of connective and other soft tissue, unspecified: Secondary | ICD-10-CM | POA: Insufficient documentation

## 2019-07-07 DIAGNOSIS — I10 Essential (primary) hypertension: Secondary | ICD-10-CM

## 2019-07-07 DIAGNOSIS — R519 Headache, unspecified: Secondary | ICD-10-CM | POA: Insufficient documentation

## 2019-07-07 DIAGNOSIS — Z9109 Other allergy status, other than to drugs and biological substances: Secondary | ICD-10-CM | POA: Insufficient documentation

## 2019-07-07 DIAGNOSIS — Z8669 Personal history of other diseases of the nervous system and sense organs: Secondary | ICD-10-CM | POA: Insufficient documentation

## 2019-07-07 MED ORDER — MICONAZOLE NITRATE 2 % EX POWD: CUTANEOUS | 0 refills | Status: DC | PRN

## 2019-07-07 MED ORDER — AMLODIPINE BESYLATE 10 MG PO TABS: 10.0000 mg | ORAL_TABLET | Freq: Every day | ORAL | 1 refills | Status: DC

## 2019-07-07 MED ORDER — METFORMIN HCL 500 MG PO TABS: 500.0000 mg | ORAL_TABLET | Freq: Two times a day (BID) | ORAL | 1 refills | Status: DC

## 2019-07-07 MED ORDER — FLUCONAZOLE 150 MG PO TABS: 150.0000 mg | ORAL_TABLET | Freq: Once | ORAL | 0 refills | Status: AC

## 2019-07-07 MED FILL — AMLODIPINE BESYLATE 10 MG T: 10 | 90 days supply | Qty: 90 | Fill #0

## 2019-07-07 MED FILL — metFORMIN HCL 500 MG TABS: 500 | 90 days supply | Qty: 180 | Fill #0

## 2019-07-07 NOTE — Telephone Encounter (Signed)
Refills sent to pharmacy. Thanks.

## 2019-07-09 ENCOUNTER — Ambulatory Visit: Payer: Medicaid Other | Admitting: Neurology

## 2019-07-09 ENCOUNTER — Telehealth: Payer: Self-pay | Admitting: Family Medicine

## 2019-07-09 DIAGNOSIS — I10 Essential (primary) hypertension: Secondary | ICD-10-CM

## 2019-07-09 MED ORDER — AMLODIPINE BESYLATE 10 MG PO TABS: 10.0000 mg | ORAL_TABLET | Freq: Every day | ORAL | 1 refills | Status: DC

## 2019-07-09 MED ORDER — METFORMIN HCL 500 MG PO TABS: 500.0000 mg | ORAL_TABLET | Freq: Two times a day (BID) | ORAL | 1 refills | Status: DC

## 2019-07-09 MED ORDER — CLONIDINE HCL 0.1 MG PO TABS: 0.1000 mg | ORAL_TABLET | Freq: Three times a day (TID) | ORAL | 1 refills | Status: DC

## 2019-07-09 NOTE — Telephone Encounter (Signed)
meds sent to pharmacy. Thanks!

## 2019-07-16 ENCOUNTER — Other Ambulatory Visit: Payer: Self-pay

## 2019-07-16 MED ORDER — CETIRIZINE HCL 10 MG PO TABS: 10.0000 mg | ORAL_TABLET | Freq: Every day | ORAL | 11 refills | Status: DC

## 2019-07-23 ENCOUNTER — Other Ambulatory Visit: Payer: Self-pay | Admitting: Family Medicine

## 2019-07-23 ENCOUNTER — Telehealth: Payer: Self-pay | Admitting: Family Medicine

## 2019-07-23 DIAGNOSIS — R1030 Lower abdominal pain, unspecified: Secondary | ICD-10-CM

## 2019-07-23 DIAGNOSIS — Z8669 Personal history of other diseases of the nervous system and sense organs: Secondary | ICD-10-CM

## 2019-07-23 MED ORDER — IBUPROFEN 600 MG PO TABS: 600.0000 mg | ORAL_TABLET | Freq: Four times a day (QID) | ORAL | 1 refills | Status: DC | PRN

## 2019-07-23 MED ORDER — OMEPRAZOLE 20 MG PO CPDR: 20.0000 mg | DELAYED_RELEASE_CAPSULE | Freq: Every day | ORAL | 3 refills | Status: DC

## 2019-07-23 MED ORDER — ACETAZOLAMIDE 250 MG PO TABS: 250.0000 mg | ORAL_TABLET | Freq: Three times a day (TID) | ORAL | 0 refills | Status: DC

## 2019-07-23 NOTE — Progress Notes (Signed)
Meds ordered this encounter  Medications  . omeprazole (PRILOSEC) 20 MG capsule    Sig: Take 1 capsule (20 mg total) by mouth daily.    Dispense:  30 capsule    Refill:  3    Order Specific Question:   Supervising Provider    Answer:   Tresa Garter [id number]  . ibuprofen (ADVIL) 600 MG tablet    Sig: Take 1 tablet (600 mg total) by mouth every 6 (six) hours as needed for headache, mild pain or moderate pain.    Dispense:  30 tablet    Refill:  1    Order Specific Question:   Supervising Provider    Answer:   Tresa Garter [id number]  . acetaZOLAMIDE (DIAMOX) 250 MG tablet    Sig: Take 1 tablet (250 mg total) by mouth 3 (three) times daily.    Dispense:  90 tablet    Refill:  0    Order Specific Question:   Supervising Provider    Answer:   Tresa Garter [id number]     [name]  APRN, MSN, FNP-C [facility] [address] [place], [place] [address] [phone]

## 2019-07-24 NOTE — Telephone Encounter (Signed)
done

## 2019-08-04 ENCOUNTER — Other Ambulatory Visit: Payer: Self-pay

## 2019-08-04 DIAGNOSIS — Z8669 Personal history of other diseases of the nervous system and sense organs: Secondary | ICD-10-CM

## 2019-08-04 MED ORDER — ACETAZOLAMIDE 250 MG PO TABS: 250.0000 mg | ORAL_TABLET | Freq: Three times a day (TID) | ORAL | 2 refills | Status: DC

## 2019-08-05 ENCOUNTER — Ambulatory Visit (INDEPENDENT_AMBULATORY_CARE_PROVIDER_SITE_OTHER): Payer: Medicaid Other | Admitting: Family Medicine

## 2019-08-05 ENCOUNTER — Other Ambulatory Visit: Payer: Self-pay

## 2019-08-05 ENCOUNTER — Encounter: Payer: Self-pay | Admitting: Family Medicine

## 2019-08-05 VITALS — BP 132/82 | HR 87 | Temp 98.2°F | Resp 16 | Ht 62.0 in | Wt 199.0 lb

## 2019-08-05 DIAGNOSIS — Z8669 Personal history of other diseases of the nervous system and sense organs: Secondary | ICD-10-CM | POA: Diagnosis not present

## 2019-08-05 DIAGNOSIS — R519 Headache, unspecified: Secondary | ICD-10-CM

## 2019-08-05 DIAGNOSIS — R110 Nausea: Secondary | ICD-10-CM | POA: Diagnosis not present

## 2019-08-05 DIAGNOSIS — R1030 Lower abdominal pain, unspecified: Secondary | ICD-10-CM | POA: Diagnosis not present

## 2019-08-05 MED ORDER — ONDANSETRON HCL 4 MG PO TABS: 4.0000 mg | ORAL_TABLET | Freq: Three times a day (TID) | ORAL | 0 refills | Status: DC | PRN

## 2019-08-05 MED ORDER — OMEPRAZOLE 40 MG PO CPDR: 40.0000 mg | DELAYED_RELEASE_CAPSULE | Freq: Every day | ORAL | 1 refills | Status: DC

## 2019-08-05 NOTE — Progress Notes (Signed)
[facility]   Established Patient Office Visit  Subjective:  Patient ID: Leah Barber, female    DOB: 11/27/1975  Age: 43 y.o. MRN: [id number]  CC:  Chief Complaint  Patient presents with  . Follow-up  . Nausea  . Fatigue    HPI Rodman Comp, 43 year old female with a medical history significant for hypertension, type 2 diabetes mellitus, polysubstance abuse, hypertension, bipolar disorder, history of cocaine abuse, history of personality disorder, history of attention deficit disorder, benzodiazepine abuse, and chronic pain syndrome presents requesting an OB/GYN and neurology referral.   Patient is difficult to follow and is of poor historian. Ms. Escandon has to be redirected constantly.  She reports a history of idiopathic intracranial hypertension with increased intraocular pressure and has been taking Diamox consistently.  Patient was previously followed by neurology in [place].  Medical records have not been reviewed at this time.  Patient endorses frequent headache pain, fatigue and occasional dizziness.  She denies paresthesias, abnormal gait, blurred vision, or present headache. Patient's referral was sent 1 month ago. She is requesting a first available appointment with neurology.   Patient also reports a history of uterine fibroids.  She endorses history of menorrhagia.  Patient has continued on norethindrone daily to control uterine bleeding.  She states that medication has been effective.   She is not under the care of an OB/GYN at this time.  She says that it was previously recommended to have fibroids removed, but she declined at that time.  She endorses spotting.  She denies abdominal pain, pelvic pain dysuria, abnormal discharge, dyspareunia, or menorrhagia.  Past Medical History:  Diagnosis Date  . Chronic lower back pain   . Diabetes mellitus without complication (HCC)   . Hyperlipemia   . Hypertension   . IIH (idiopathic  intracranial hypertension)     Past Surgical History:  Procedure Laterality Date  . fluid removed from brain  12/20/2018    Family History  Problem Relation Age of Onset  . Diabetes Mother   . Hypertension Mother   . Cancer Father     Social History   Socioeconomic History  . Marital status: Legally Separated    Spouse name: Not on file  . Number of children: Not on file  . Years of education: Not on file  . Highest education level: Not on file  Occupational History  . Not on file  Tobacco Use  . Smoking status: Current Every Day Smoker    Packs/day: 1.00    Types: Cigarettes  . Smokeless tobacco: Never Used  Substance and Sexual Activity  . Alcohol use: No  . Drug use: No  . Sexual activity: Not Currently  Other Topics Concern  . Not on file  Social History Narrative  . Not on file   Social Determinants of Health   Financial Resource Strain:   . Difficulty of Paying Living Expenses: Not on file  Food Insecurity:   . Worried About Programme researcher, broadcasting/film/video in the Last Year: Not on file  . Ran Out of Food in the Last Year: Not on file  Transportation Needs:   . Lack of Transportation (Medical): Not on file  . Lack of Transportation (Non-Medical): Not on file  Physical Activity:   . Days of Exercise per Week: Not on file  . Minutes of Exercise per Session: Not on file  Stress:   . Feeling of Stress : Not on file  Social Connections:   .  Frequency of Communication with Friends and Family: Not on file  . Frequency of Social Gatherings with Friends and Family: Not on file  . Attends Religious Services: Not on file  . Active Member of Clubs or Organizations: Not on file  . Attends Banker Meetings: Not on file  . Marital Status: Not on file  Intimate Partner Violence:   . Fear of Current or Ex-Partner: Not on file  . Emotionally Abused: Not on file  . Physically Abused: Not on file  . Sexually Abused: Not on file    Outpatient Medications Prior to  Visit  Medication Sig Dispense Refill  . acetaZOLAMIDE (DIAMOX) 250 MG tablet Take 1 tablet (250 mg total) by mouth 3 (three) times daily. 90 tablet 2  . albuterol (PROVENTIL HFA;VENTOLIN HFA) 108 (90 Base) MCG/ACT inhaler Inhale 2 puffs into the lungs every 4 (four) hours as needed for wheezing or shortness of breath. 1 Inhaler 3  . amLODipine (NORVASC) 10 MG tablet Take 1 tablet (10 mg total) by mouth daily. 90 tablet 1  . baclofen (LIORESAL) 10 MG tablet Take 1 tablet (10 mg total) by mouth 3 (three) times daily. 90 each 1  . Brexpiprazole (REXULTI PO) Take by mouth.    . Buprenorphine HCl-Naloxone HCl 8-2 MG FILM Place 1 Film under the tongue 2 (two) times daily.     . cetirizine (ZYRTEC) 10 MG tablet Take 1 tablet (10 mg total) by mouth daily. 30 tablet 11  . cloNIDine (CATAPRES) 0.1 MG tablet Take 1 tablet (0.1 mg total) by mouth 3 (three) times daily. 60 tablet 1  . DULoxetine (CYMBALTA) 60 MG capsule Take 1 capsule (60 mg total) by mouth 2 (two) times daily. 60 capsule 3  . FLUoxetine (PROZAC) 20 MG capsule Take 1 capsule (20 mg total) by mouth daily. 30 capsule 3  . fluticasone (FLONASE) 50 MCG/ACT nasal spray Place 2 sprays into both nostrils daily. 9.9 g 2  . furosemide (LASIX) 20 MG tablet Take 1 tablet (20 mg total) by mouth daily. 30 tablet 3  . gabapentin (NEURONTIN) 400 MG capsule Take 2 capsules (800 mg total) by mouth 3 (three) times daily. 90 capsule 3  . hydrOXYzine (ATARAX/VISTARIL) 25 MG tablet Take 25 mg by mouth 3 (three) times daily as needed.    Marland Kitchen ibuprofen (ADVIL) 600 MG tablet Take 1 tablet (600 mg total) by mouth every 6 (six) hours as needed for headache, mild pain or moderate pain. 30 tablet 1  . metFORMIN (GLUCOPHAGE) 500 MG tablet Take 1 tablet (500 mg total) by mouth 2 (two) times daily with a meal. 180 tablet 1  . methylphenidate (RITALIN) 10 MG tablet Take 10 mg by mouth 2 (two) times daily.    . miconazole (MICOTIN) 2 % powder Apply topically as needed for  itching. 70 g 0  . norethindrone (AYGESTIN) 5 MG tablet Take 2 tablets (10 mg total) by mouth daily. 60 tablet 3  . omeprazole (PRILOSEC) 20 MG capsule Take 1 capsule (20 mg total) by mouth daily. 30 capsule 3  . Spacer/Aero-Holding Chambers (AEROCHAMBER PLUS WITH MASK) inhaler Use as instructed 1 each 2  . traZODone (DESYREL) 150 MG tablet Take by mouth at bedtime.     No facility-administered medications prior to visit.    Allergies  Allergen Reactions  . Hydrocodone Nausea And Vomiting    "stomach pain"  . Morphine Other (See Comments)    ROS Review of Systems  Constitutional: Negative for activity change, appetite  change and fatigue.  HENT: Negative.   Eyes: Negative.   Cardiovascular: Negative for chest pain and leg swelling.  Gastrointestinal: Negative.   Endocrine: Negative.   Genitourinary: Positive for vaginal bleeding. Negative for difficulty urinating and vaginal discharge.  Musculoskeletal: Positive for arthralgias.  Skin: Positive for rash.  Neurological: Positive for dizziness, weakness, light-headedness, numbness and headaches.  Hematological: Negative.   Psychiatric/Behavioral: Negative.       Objective:    Physical Exam  Constitutional: She is oriented to person, place, and time. She appears well-developed and well-nourished. No distress.  HENT:  Head: Normocephalic and atraumatic.  Right Ear: External ear normal.  Left Ear: External ear normal.  Nose: Nose normal.  Mouth/Throat: Oropharynx is clear and moist.  Eyes: Pupils are equal, round, and reactive to light.  Cardiovascular: Normal rate, regular rhythm and normal heart sounds.  Pulmonary/Chest: Effort normal and breath sounds normal.  Abdominal: Soft. Bowel sounds are normal. She exhibits no distension.  Musculoskeletal:     Cervical back: Normal range of motion and neck supple.  Neurological: She is alert and oriented to person, place, and time. She has normal reflexes. No cranial nerve  deficit. Coordination normal.  Psychiatric: Her affect is labile. Her speech is slurred. She is not agitated. She expresses impulsivity and inappropriate judgment. She expresses no homicidal ideation.    BP 132/82 (BP Location: Left Arm, Patient Position: Sitting, Cuff Size: Large)   Pulse 87   Temp 98.2 F (36.8 C) (Oral)   Resp 16   Ht 5\' 2"  (1.575 m)   Wt 199 lb (90.3 kg)   SpO2 100%   BMI 36.40 kg/m  Wt Readings from Last 3 Encounters:  08/05/19 199 lb (90.3 kg)  07/02/19 204 lb (92.5 kg)  05/20/19 203 lb (92.1 kg)     Health Maintenance Due  Topic Date Due  . TETANUS/TDAP  09/21/1992  . INFLUENZA VACCINE  03/22/2019    There are no preventive care reminders to display for this patient.  Lab Results  Component Value Date   TSH 1.10 11/23/2016   Lab Results  Component Value Date   WBC 8.7 05/20/2019   HGB 12.4 05/20/2019   HCT 38.8 05/20/2019   MCV 93 05/20/2019   PLT 281 05/20/2019   Lab Results  Component Value Date   NA 142 05/20/2019   K 3.8 05/20/2019   CO2 16 (L) 05/20/2019   GLUCOSE 73 05/20/2019   BUN 12 05/20/2019   CREATININE 0.99 05/20/2019   BILITOT 0.3 05/20/2019   ALKPHOS 93 05/20/2019   AST 22 05/20/2019   ALT 26 05/20/2019   PROT 6.9 05/20/2019   ALBUMIN 4.6 05/20/2019   CALCIUM 8.8 05/20/2019   ANIONGAP 6 12/29/2016   Lab Results  Component Value Date   CHOL 216 (H) 11/23/2016   Lab Results  Component Value Date   HDL 47 (L) 11/23/2016   Lab Results  Component Value Date   LDLCALC 151 (H) 11/23/2016   Lab Results  Component Value Date   TRIG 92 11/23/2016   Lab Results  Component Value Date   CHOLHDL 4.6 11/23/2016   Lab Results  Component Value Date   HGBA1C 5.9 (A) 05/20/2019        Nonintractable headache, unspecified chronicity pattern, unspecified headache type Referred to neurology, will defer for further work up and evaluation.   History of glaucoma Referral sent to ophthalmology, appointment  scheduled.  History of idiopathic intracranial hypertension Referral was previously sent. Scheduled appointm  Nausea - ondansetron (ZOFRAN) 4 MG tablet; Take 1 tablet (4 mg total) by mouth every 8 (eight) hours as needed for nausea or vomiting.  Dispense: 30 tablet; Refill: 0  Lower abdominal pain - omeprazole (PRILOSEC) 40 MG capsule; Take 1 capsule (40 mg total) by mouth daily.  Dispense: 90 capsule; Refill: 1   Follow-up:  Follow up in 3 months for chronic conditions   Anniya Whiters Rennis Petty  APRN, MSN, FNP-C [facility] [address] [place], [place] [address] [phone]

## 2019-08-11 ENCOUNTER — Encounter: Payer: Medicaid Other | Admitting: Obstetrics & Gynecology

## 2019-08-12 ENCOUNTER — Other Ambulatory Visit: Payer: Self-pay | Admitting: Family Medicine

## 2019-08-12 ENCOUNTER — Ambulatory Visit: Payer: Medicaid Other | Admitting: Family Medicine

## 2019-08-12 ENCOUNTER — Telehealth: Payer: Self-pay | Admitting: Family Medicine

## 2019-08-12 MED ORDER — GABAPENTIN 400 MG PO CAPS: 800.0000 mg | ORAL_CAPSULE | Freq: Three times a day (TID) | ORAL | 5 refills | Status: DC

## 2019-08-12 NOTE — Progress Notes (Signed)
Meds ordered this encounter  Medications  . DISCONTD: gabapentin (NEURONTIN) 400 MG capsule    Sig: Take 2 capsules (800 mg total) by mouth 3 (three) times daily.    Dispense:  90 capsule    Refill:  5    Order Specific Question:   Supervising Provider    Answer:   Tresa Garter [id number]  . gabapentin (NEURONTIN) 400 MG capsule    Sig: Take 2 capsules (800 mg total) by mouth 3 (three) times daily.    Dispense:  90 capsule    Refill:  5    Order Specific Question:   Supervising Provider    Answer:   Tresa Garter [id number]     [name]  APRN, MSN, FNP-C [facility] [address] [place], [place] [address] [phone]

## 2019-08-12 NOTE — Telephone Encounter (Signed)
done

## 2019-08-13 ENCOUNTER — Encounter: Payer: Self-pay | Admitting: Neurology

## 2019-08-13 ENCOUNTER — Other Ambulatory Visit: Payer: Self-pay

## 2019-08-13 ENCOUNTER — Other Ambulatory Visit: Payer: Self-pay | Admitting: Family Medicine

## 2019-08-13 ENCOUNTER — Ambulatory Visit: Payer: Medicaid Other | Admitting: Neurology

## 2019-08-13 VITALS — BP 117/76 | HR 76 | Temp 97.5°F | Ht 63.0 in | Wt 200.5 lb

## 2019-08-13 DIAGNOSIS — G479 Sleep disorder, unspecified: Secondary | ICD-10-CM

## 2019-08-13 DIAGNOSIS — G4489 Other headache syndrome: Secondary | ICD-10-CM | POA: Diagnosis not present

## 2019-08-13 DIAGNOSIS — G932 Benign intracranial hypertension: Secondary | ICD-10-CM | POA: Diagnosis not present

## 2019-08-13 HISTORY — DX: Benign intracranial hypertension: G93.2

## 2019-08-13 NOTE — Progress Notes (Signed)
Reason for visit: Pseudotumor cerebri, headache  Referring physician: Dr. Lahoma Rocker is a 43 y.o. female  History of present illness:  Leah Barber is a 43 year old right-handed black female with a history of onset of blurred vision that occurred around May 2020.  At that time, she was living in [place].  She went to her eye doctor who told her that she had glaucoma and had papilledema.  She was seen through a neurologist and eventually underwent a CAT scan of the head and then had a spinal tap done.  She was placed on Diamox, apparently she was on a very high dose of 500 mg 3 times daily.  The patient also had around the time began having a lot of trouble with excessive daytime drowsiness.  At times she says she has difficulty sleeping at night but she has a lot of difficulty staying awake during the day.  Often times she will sleep sitting up.  She apparently underwent a sleep study and was told that she needed CPAP, but this was never set up.  She has moved to this area to be closer to her family.  She has chronic low back pain, she was told that she had a disc problem at the L4-5 level and spinal stenosis but never had lumbosacral spine surgery.  In the past, she has a history of marijuana and cocaine abuse, she still smokes marijuana.  The patient at this time is having some headaches around the left frontal area.  She does report some neck stiffness and some blurred vision and occasional double vision.  She denies any muffled hearing.  She also has some numbness in the hands at times, she may drop things.  She does have some balance issues, she may have occasional falls.  She feels weak all over at times.  She apparently has had an EEG study in the past, the results of this are not available to me.  She is sent to this office for an evaluation.  Past Medical History:  Diagnosis Date  . Anxiety   . Asthma   . Chronic lower back pain   . Depression   . Diabetes mellitus without  complication ([facility])   . Hyperlipemia   . Hypertension   . IIH (idiopathic intracranial hypertension)     Past Surgical History:  Procedure Laterality Date  . fluid removed from brain  12/20/2018    Family History  Problem Relation Age of Onset  . Diabetes Mother   . Hypertension Mother   . Cancer Father     Social history:  reports that she has been smoking cigarettes. She has been smoking about 1.00 pack per day. She has never used smokeless tobacco. She reports that she does not drink alcohol or use drugs.  Medications:  Prior to Admission medications   Medication Sig Start Date End Date Taking? Authorizing Provider  acetaZOLAMIDE (DIAMOX) 250 MG tablet Take 1 tablet (250 mg total) by mouth 3 (three) times daily. 08/04/19  Yes Dorena Dew, FNP  albuterol (PROVENTIL HFA;VENTOLIN HFA) 108 (90 Base) MCG/ACT inhaler Inhale 2 puffs into the lungs every 4 (four) hours as needed for wheezing or shortness of breath. 06/20/17  Yes Scot Jun, FNP  amLODipine (NORVASC) 10 MG tablet Take 1 tablet (10 mg total) by mouth daily. 07/09/19  Yes Dorena Dew, FNP  baclofen (LIORESAL) 10 MG tablet Take 1 tablet (10 mg total) by mouth 3 (three) times daily. 06/12/19  Yes  Dorena Dew, FNP  Brexpiprazole (REXULTI PO) Take by mouth.   Yes [provider]  Buprenorphine HCl-Naloxone HCl 8-2 MG FILM Place 1 Film under the tongue 2 (two) times daily.    Yes [provider]  cetirizine (ZYRTEC) 10 MG tablet Take 1 tablet (10 mg total) by mouth daily. 07/16/19  Yes Dorena Dew, FNP  cloNIDine (CATAPRES) 0.1 MG tablet Take 1 tablet (0.1 mg total) by mouth 3 (three) times daily. 07/09/19  Yes Dorena Dew, FNP  CONCERTA 27 MG CR tablet Take 27 mg by mouth every morning. 07/03/19  Yes [provider]  DULoxetine (CYMBALTA) 60 MG capsule Take 1 capsule (60 mg total) by mouth 2 (two) times daily. 06/06/19  Yes Dorena Dew, FNP  FLUoxetine  (PROZAC) 20 MG capsule Take 1 capsule (20 mg total) by mouth daily. 05/28/19  Yes Dorena Dew, FNP  fluticasone (FLONASE) 50 MCG/ACT nasal spray Place 2 sprays into both nostrils daily. 07/02/19  Yes Dorena Dew, FNP  furosemide (LASIX) 20 MG tablet Take 1 tablet (20 mg total) by mouth daily. 06/06/19  Yes Dorena Dew, FNP  gabapentin (NEURONTIN) 400 MG capsule Take 2 capsules (800 mg total) by mouth 3 (three) times daily. 08/12/19  Yes Dorena Dew, FNP  hydrOXYzine (ATARAX/VISTARIL) 25 MG tablet Take 25 mg by mouth 3 (three) times daily as needed.   Yes [provider]  ibuprofen (ADVIL) 400 MG tablet Take by mouth. 01/17/16  Yes [provider]  metFORMIN (GLUCOPHAGE) 500 MG tablet Take 1 tablet (500 mg total) by mouth 2 (two) times daily with a meal. 07/09/19  Yes Dorena Dew, FNP  miconazole (MICOTIN) 2 % powder Apply topically as needed for itching. 07/07/19  Yes Dorena Dew, FNP  norethindrone (AYGESTIN) 5 MG tablet Take 2 tablets (10 mg total) by mouth daily. 07/02/19  Yes Dorena Dew, FNP  omeprazole (PRILOSEC) 40 MG capsule Take 1 capsule (40 mg total) by mouth daily. 08/05/19  Yes Dorena Dew, FNP  ondansetron (ZOFRAN) 4 MG tablet Take 1 tablet (4 mg total) by mouth every 8 (eight) hours as needed for nausea or vomiting. 08/05/19  Yes Dorena Dew, FNP  Spacer/Aero-Holding Chambers (AEROCHAMBER PLUS WITH MASK) inhaler Use as instructed 12/11/16  Yes Muthersbaugh, Jarrett Soho, PA-C      Allergies  Allergen Reactions  . Clindamycin/Lincomycin     Nausea   . Hydrocodone Nausea And Vomiting    "stomach pain" "stomach pain"  . Morphine Other (See Comments) and Nausea Only    ROS:  Out of a complete 14 system review of symptoms, the patient complains only of the following symptoms, and all other reviewed systems are negative.  Sleepiness Headache Numbness Balance problems  Blood pressure 117/76, pulse 76, temperature  (!) 97.5 F (36.4 C), height 5\' 3"  (1.6 m), weight 200 lb 8 oz (90.9 kg).  Physical Exam  General: The patient is alert and cooperative at the time of the examination.  The patient is markedly obese.  Eyes: Pupils are equal, round, and reactive to light. Discs are flat bilaterally.  There is some cupping of the discs.  No definite venous pulsations could be seen.  Neck: The neck is supple, no carotid bruits are noted.  Respiratory: The respiratory examination is clear.  Cardiovascular: The cardiovascular examination reveals a regular rate and rhythm, no obvious murmurs or rubs are noted.  Skin: Extremities are without significant edema.  Neurologic Exam  Mental status: The patient is alert and oriented x 3 at the time of the examination. The patient has apparent normal recent and remote memory, with an apparently normal attention span and concentration ability.  Cranial nerves: Facial symmetry is present. There is good sensation of the face to pinprick and soft touch bilaterally. The strength of the facial muscles and the muscles to head turning and shoulder shrug are normal bilaterally. Speech is well enunciated, no aphasia or dysarthria is noted. Extraocular movements are full. Visual fields are full. The tongue is midline, and the patient has symmetric elevation of the soft palate. No obvious hearing deficits are noted.  Motor: The motor testing reveals 5 over 5 strength of all 4 extremities. Good symmetric motor tone is noted throughout.  Sensory: Sensory testing is intact to pinprick, soft touch, vibration sensation, and position sense on all 4 extremities, with exception of some decreased pinprick sensation of the right arm and right leg. No evidence of extinction is noted.  Coordination: Cerebellar testing reveals good finger-nose-finger and heel-to-shin bilaterally.  Gait and station: Gait is normal. Tandem gait is normal. Romberg is negative. No drift is seen.  Reflexes: Deep  tendon reflexes are symmetric and normal bilaterally, with exception of absence of the right ankle jerk reflex, well-maintained on the left. Toes are downgoing bilaterally.   Assessment/Plan:  1.  History of pseudotumor cerebri  2.  Chronic low back pain  3.  Excessive daytime drowsiness, history of sleep apnea  The patient will be set up for a sleep evaluation, she claims that she was told that she needed CPAP.  She has some records at home that she will bring into our office.  The patient will be set up for MRI of the brain.  If this is unremarkable, we will set up another spinal tap to document opening pressure.  She will be seen through [facility] in the near future, I have asked her to tell them to send me a report of their evaluation.  I do not see evidence of papilledema on my clinical examination, the patient does have cupping of the disc.  The patient is on a combination of Diamox and Lasix, both of these medications may help pseudotumor.  The patient will follow-up here in 3 months.  [name] MD 08/13/2019 11:38 AM  [facility] [address] [place], [place] [address]  Phone [phone] Fax [phone]

## 2019-08-21 ENCOUNTER — Other Ambulatory Visit: Payer: Self-pay

## 2019-08-21 MED ORDER — IBUPROFEN 600 MG PO TABS: 600.0000 mg | ORAL_TABLET | Freq: Three times a day (TID) | ORAL | 3 refills | Status: DC | PRN

## 2019-08-25 ENCOUNTER — Other Ambulatory Visit: Payer: Self-pay

## 2019-08-25 ENCOUNTER — Encounter: Payer: Self-pay | Admitting: Neurology

## 2019-08-25 ENCOUNTER — Ambulatory Visit: Payer: Medicaid Other | Admitting: Neurology

## 2019-08-25 VITALS — BP 124/87 | HR 87 | Temp 97.6°F | Ht 62.0 in | Wt 203.0 lb

## 2019-08-25 DIAGNOSIS — F1920 Other psychoactive substance dependence, uncomplicated: Secondary | ICD-10-CM

## 2019-08-25 DIAGNOSIS — F1994 Other psychoactive substance use, unspecified with psychoactive substance-induced mood disorder: Secondary | ICD-10-CM | POA: Diagnosis not present

## 2019-08-25 DIAGNOSIS — F1310 Sedative, hypnotic or anxiolytic abuse, uncomplicated: Secondary | ICD-10-CM

## 2019-08-25 DIAGNOSIS — F1210 Cannabis abuse, uncomplicated: Secondary | ICD-10-CM

## 2019-08-25 DIAGNOSIS — G4730 Sleep apnea, unspecified: Secondary | ICD-10-CM

## 2019-08-25 DIAGNOSIS — G4719 Other hypersomnia: Secondary | ICD-10-CM

## 2019-08-25 DIAGNOSIS — R519 Headache, unspecified: Secondary | ICD-10-CM

## 2019-08-25 DIAGNOSIS — G4710 Hypersomnia, unspecified: Secondary | ICD-10-CM | POA: Diagnosis not present

## 2019-08-25 DIAGNOSIS — F6089 Other specific personality disorders: Secondary | ICD-10-CM

## 2019-08-25 DIAGNOSIS — J42 Unspecified chronic bronchitis: Secondary | ICD-10-CM

## 2019-08-25 DIAGNOSIS — Z8669 Personal history of other diseases of the nervous system and sense organs: Secondary | ICD-10-CM

## 2019-08-25 NOTE — Patient Instructions (Signed)
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
## 2019-08-27 ENCOUNTER — Other Ambulatory Visit: Payer: Self-pay | Admitting: Family Medicine

## 2019-08-27 ENCOUNTER — Telehealth: Payer: Self-pay

## 2019-08-27 MED ORDER — FLUCONAZOLE 150 MG PO TABS: 150.0000 mg | ORAL_TABLET | Freq: Once | ORAL | 0 refills | Status: AC

## 2019-08-27 NOTE — Telephone Encounter (Signed)
Patient called and says she was given an abx from oral surgeon this week. She states she has now developed a yeast infection. She is asking if you can send in diflucan for yeast infection? Please advise.

## 2019-08-27 NOTE — Progress Notes (Signed)
Meds ordered this encounter  Medications  . fluconazole (DIFLUCAN) 150 MG tablet    Sig: Take 1 tablet (150 mg total) by mouth once for 1 dose.    Dispense:  2 tablet    Refill:  0    Order Specific Question:   Supervising Provider    Answer:   Tresa Garter [id number]    [name]  APRN, MSN, FNP-C [facility] [address] [place], [place] [address] [phone]

## 2019-09-09 ENCOUNTER — Ambulatory Visit (INDEPENDENT_AMBULATORY_CARE_PROVIDER_SITE_OTHER): Payer: Medicaid Other | Admitting: Family Medicine

## 2019-09-09 ENCOUNTER — Encounter: Payer: Self-pay | Admitting: Family Medicine

## 2019-09-09 ENCOUNTER — Ambulatory Visit (INDEPENDENT_AMBULATORY_CARE_PROVIDER_SITE_OTHER): Payer: Medicaid Other | Admitting: Neurology

## 2019-09-09 ENCOUNTER — Other Ambulatory Visit: Payer: Self-pay

## 2019-09-09 VITALS — BP 123/76 | HR 80 | Temp 98.5°F | Resp 14 | Ht 62.0 in | Wt 204.0 lb

## 2019-09-09 DIAGNOSIS — F1920 Other psychoactive substance dependence, uncomplicated: Secondary | ICD-10-CM

## 2019-09-09 DIAGNOSIS — G4710 Hypersomnia, unspecified: Secondary | ICD-10-CM

## 2019-09-09 DIAGNOSIS — G894 Chronic pain syndrome: Secondary | ICD-10-CM

## 2019-09-09 DIAGNOSIS — I10 Essential (primary) hypertension: Secondary | ICD-10-CM | POA: Diagnosis not present

## 2019-09-09 DIAGNOSIS — F1310 Sedative, hypnotic or anxiolytic abuse, uncomplicated: Secondary | ICD-10-CM

## 2019-09-09 DIAGNOSIS — G4719 Other hypersomnia: Secondary | ICD-10-CM

## 2019-09-09 DIAGNOSIS — F1210 Cannabis abuse, uncomplicated: Secondary | ICD-10-CM

## 2019-09-09 DIAGNOSIS — J42 Unspecified chronic bronchitis: Secondary | ICD-10-CM

## 2019-09-09 DIAGNOSIS — Z8669 Personal history of other diseases of the nervous system and sense organs: Secondary | ICD-10-CM

## 2019-09-09 DIAGNOSIS — F6089 Other specific personality disorders: Secondary | ICD-10-CM

## 2019-09-09 DIAGNOSIS — R7303 Prediabetes: Secondary | ICD-10-CM | POA: Diagnosis not present

## 2019-09-09 DIAGNOSIS — R519 Headache, unspecified: Secondary | ICD-10-CM

## 2019-09-09 DIAGNOSIS — F1994 Other psychoactive substance use, unspecified with psychoactive substance-induced mood disorder: Secondary | ICD-10-CM

## 2019-09-09 LAB — POCT GLYCOSYLATED HEMOGLOBIN (HGB A1C): Hemoglobin A1C: 6.1 % — AB (ref 4.0–5.6)

## 2019-09-09 NOTE — Progress Notes (Signed)
[facility]eah [facility]    Subjective:  Leah Barber ID: Leah Barber, female    DOB: 06/13/1976  Age: 44 y.o. MRN: [id number]  CC:  Chief Complaint  Leah Barber presents with  . Follow-up    wants referral for physical for arms, back and shoulders     HPI Leah Barber, a 44 year old female with a medical history significant for essential hypertension, prediabetes, history of polysubstance abuse, bipolar disorder, history of cocaine abuse, history of idiopathic intracranial hypertension, history of personality disorder history of attention deficit disorder, benzodiazepine abuse, and chronic pain syndrome presents for follow-up of chronic conditions. Leah Barber has to be redirected often and she is falling asleep during interview.  Leah Barber is quite difficult to follow.    Chronic pain syndrome: Leah Barber has a history of chronic pain and is requesting an appointment to physical therapy. Leah Barber says that pain is primarily to bilateral shoulders, low back and hips. Leah Barber is requesting opiate pain medications. She says that pain has been elevated over the past several days. She attributes increased pain to changes in weather. She characterizes pain as constant and aching. Leah Barber is requesting opiates she says, "I need to know whether you're going to prescribe me pain medicine when I need it". She says that previous PCP prescribed opiate medications. Pain intensity is 6/10.   Diabetes She has type 2 diabetes mellitus. Her disease course has been stable. Pertinent negatives for diabetes include no polydipsia, no polyphagia, no polyuria, no weakness and no weight loss. Risk factors for coronary artery disease include diabetes mellitus. She is following a generally unhealthy diet. When asked about meal planning, she reported none. She has not had a previous visit with a dietitian. She does not see a podiatrist.Eye exam is current.  Hypertension This is a chronic problem. The  problem is controlled. Risk factors for coronary artery disease include obesity, smoking/tobacco exposure and sedentary lifestyle. Compliance problems include exercise.     Marland Kitchen  Past Medical History:  Diagnosis Date  . Anxiety   . Asthma   . Chronic lower back pain   . Depression   . Diabetes mellitus without complication ([place])   . Hyperlipemia   . Hypertension   . IIH (idiopathic intracranial hypertension)   . Pseudotumor cerebri 08/13/2019    Past Surgical History:  Procedure Laterality Date  . fluid removed from brain  12/20/2018    Family History  Problem Relation Age of Onset  . Diabetes Mother   . Hypertension Mother   . Cancer Father     Social History   Socioeconomic History  . Marital status: Legally Separated    Spouse name: Not on file  . Number of children: Not on file  . Years of education: Not on file  . Highest education level: Not on file  Occupational History  . Not on file  Tobacco Use  . Smoking status: Current Every Day Smoker    Packs/day: 1.00    Types: Cigarettes  . Smokeless tobacco: Never Used  Substance and Sexual Activity  . Alcohol use: No  . Drug use: No  . Sexual activity: Not Currently  Other Topics Concern  . Not on file  Social History Narrative  . Not on file   Social Determinants of Health   Financial Resource Strain:   . Difficulty of Paying Living Expenses: Not on file  Food Insecurity:   . Worried About Charity fundraiser in the Last Year: Not on  file  . [place] in the Last Year: Not on file  Transportation Needs:   . Lack of Transportation (Medical): Not on file  . Lack of Transportation (Non-Medical): Not on file  Physical Activity:   . Days of Exercise per Week: Not on file  . Minutes of Exercise per Session: Not on file  Stress:   . Feeling of Stress : Not on file  Social Connections:   . Frequency of Communication with Friends and Family: Not on file  . Frequency of Social Gatherings with Friends  and Family: Not on file  . Attends Religious Services: Not on file  . Active Member of Clubs or Organizations: Not on file  . Attends Archivist Meetings: Not on file  . Marital Status: Not on file  Intimate Partner Violence:   . Fear of Current or Ex-Partner: Not on file  . Emotionally Abused: Not on file  . Physically Abused: Not on file  . Sexually Abused: Not on file    Outpatient Medications Prior to Visit  Medication Sig Dispense Refill  . acetaZOLAMIDE (DIAMOX) 250 MG tablet Take 1 tablet (250 mg total) by mouth 3 (three) times daily. 90 tablet 2  . albuterol (PROVENTIL HFA;VENTOLIN HFA) 108 (90 Base) MCG/ACT inhaler Inhale 2 puffs into the lungs every 4 (four) hours as needed for wheezing or shortness of breath. 1 Inhaler 3  . amLODipine (NORVASC) 10 MG tablet Take 1 tablet (10 mg total) by mouth daily. 90 tablet 1  . baclofen (LIORESAL) 10 MG tablet Take 1 tablet (10 mg total) by mouth 3 (three) times daily. 90 each 1  . Buprenorphine HCl-Naloxone HCl 8-2 MG FILM Place 1 Film under the tongue 2 (two) times daily.     . cetirizine (ZYRTEC) 10 MG tablet Take 1 tablet (10 mg total) by mouth daily. 30 tablet 11  . cloNIDine (CATAPRES) 0.1 MG tablet Take 1 tablet (0.1 mg total) by mouth 3 (three) times daily. 60 tablet 1  . CONCERTA 27 MG CR tablet Take 27 mg by mouth every morning.    . DULoxetine (CYMBALTA) 60 MG capsule Take 1 capsule (60 mg total) by mouth 2 (two) times daily. 60 capsule 3  . FLUoxetine (PROZAC) 20 MG capsule Take 1 capsule (20 mg total) by mouth daily. 30 capsule 3  . fluticasone (FLONASE) 50 MCG/ACT nasal spray Place 2 sprays into both nostrils daily. 9.9 g 2  . furosemide (LASIX) 20 MG tablet Take 1 tablet (20 mg total) by mouth daily. 30 tablet 3  . gabapentin (NEURONTIN) 400 MG capsule Take 2 capsules (800 mg total) by mouth 3 (three) times daily. 90 capsule 5  . hydrOXYzine (ATARAX/VISTARIL) 25 MG tablet Take 25 mg by mouth 3 (three) times daily as  needed.    Marland Kitchen ibuprofen (ADVIL) 600 MG tablet Take 1 tablet (600 mg total) by mouth every 8 (eight) hours as needed. 90 tablet 3  . metFORMIN (GLUCOPHAGE) 500 MG tablet Take 1 tablet (500 mg total) by mouth 2 (two) times daily with a meal. 180 tablet 1  . miconazole (MICOTIN) 2 % powder Apply topically as needed for itching. 70 g 0  . norethindrone (AYGESTIN) 5 MG tablet Take 2 tablets (10 mg total) by mouth daily. 60 tablet 3  . omeprazole (PRILOSEC) 40 MG capsule Take 1 capsule (40 mg total) by mouth daily. 90 capsule 1  . ondansetron (ZOFRAN) 4 MG tablet Take 1 tablet (4 mg total) by mouth every  8 (eight) hours as needed for nausea or vomiting. 30 tablet 0  . Spacer/Aero-Holding Chambers (AEROCHAMBER PLUS WITH MASK) inhaler Use as instructed 1 each 2  . Brexpiprazole (REXULTI PO) Take by mouth.     No facility-administered medications prior to visit.    Allergies  Allergen Reactions  . Clindamycin/Lincomycin     Nausea   . Hydrocodone Nausea And Vomiting    "stomach pain" "stomach pain"  . Morphine Other (See Comments) and Nausea Only    ROS Review of Systems  Constitutional: Negative.  Negative for weight loss.  HENT: Negative.   Respiratory: Negative.   Cardiovascular: Negative.   Gastrointestinal: Negative.   Endocrine: Negative.  Negative for polydipsia, polyphagia and polyuria.  Genitourinary: Negative.   Musculoskeletal: Positive for arthralgias and back pain.  Skin: Negative.   Allergic/Immunologic: Negative.   Neurological: Negative.  Negative for weakness.  Psychiatric/Behavioral: Negative.       Objective:    Physical Exam  Constitutional: She is oriented to person, place, and time. She appears well-developed and well-nourished.  HENT:  Head: Normocephalic.  Eyes: Pupils are equal, round, and reactive to light.  Cardiovascular: Normal rate and regular rhythm.  Pulmonary/Chest: Effort normal and breath sounds normal.  Abdominal: Soft. Bowel sounds are  normal.  Musculoskeletal:        General: Normal range of motion.     Cervical back: Normal range of motion.  Neurological: She is alert and oriented to person, place, and time.  Skin: Skin is warm.  Psychiatric: Her speech is normal. Judgment and thought content normal. She is slowed and withdrawn. Cognition and memory are normal. She exhibits a depressed mood.    BP 123/76 (BP Location: Left Arm, Leah Barber Position: Sitting, Cuff Size: Normal)   Pulse 80   Temp 98.5 F (36.9 C) (Oral)   Resp 14   Ht 5\' 2"  (1.575 m)   Wt 204 lb (92.5 kg)   SpO2 97%   BMI 37.31 kg/m  Wt Readings from Last 3 Encounters:  09/09/19 204 lb (92.5 kg)  08/25/19 203 lb (92.1 kg)  08/13/19 200 lb 8 oz (90.9 kg)     Health Maintenance Due  Topic Date Due  . TETANUS/TDAP  09/21/1992  . INFLUENZA VACCINE  03/22/2019    There are no preventive care reminders to display for this Leah Barber.  Lab Results  Component Value Date   TSH 1.10 11/23/2016   Lab Results  Component Value Date   WBC 8.7 05/20/2019   HGB 12.4 05/20/2019   HCT 38.8 05/20/2019   MCV 93 05/20/2019   PLT 281 05/20/2019   Lab Results  Component Value Date   NA 142 05/20/2019   K 3.8 05/20/2019   CO2 16 (L) 05/20/2019   GLUCOSE 73 05/20/2019   BUN 12 05/20/2019   CREATININE 0.99 05/20/2019   BILITOT 0.3 05/20/2019   ALKPHOS 93 05/20/2019   AST 22 05/20/2019   ALT 26 05/20/2019   PROT 6.9 05/20/2019   ALBUMIN 4.6 05/20/2019   CALCIUM 8.8 05/20/2019   ANIONGAP 6 12/29/2016   Lab Results  Component Value Date   CHOL 216 (H) 11/23/2016   Lab Results  Component Value Date   HDL 47 (L) 11/23/2016   Lab Results  Component Value Date   LDLCALC 151 (H) 11/23/2016   Lab Results  Component Value Date   TRIG 92 11/23/2016   Lab Results  Component Value Date   CHOLHDL 4.6 11/23/2016   Lab Results  Component Value Date   HGBA1C 5.9 (A) 05/20/2019      Assessment & Plan:   Problem List Items Addressed This  Visit      Other   Polysubstance dependence including opioid type drug with complication, episodic abuse, with unspecified complication ([place])   Chronic pain syndrome - Primary   Relevant Orders   Ambulatory referral to Physical Therapy    Other Visit Diagnoses    Essential hypertension       Relevant Orders   Basic Metabolic Panel (Completed)   Prediabetes       Relevant Orders   HgB A1c (Completed)   Basic Metabolic Panel (Completed)      Chronic pain syndrome Leah Barber is complaining of widespread pain. She has decreased ROM. Leah Barber may benefit from physical therapy.  - Ambulatory referral to Physical Therapy  Essential hypertension Continue medication, monitor blood pressure at home. Continue DASH diet. Reminder to go to the ER if any CP, SOB, nausea, dizziness, severe HA, changes vision/speech, left arm numbness and tingling and jaw pain.   - Basic Metabolic Panel  Prediabetes Hemogloibn A1C is 6.1, will continue metformin as previous prescribed. The Leah Barber is asked to make an attempt to improve diet and exercise patterns to aid in medical management of this problem.  - HgB 123456 - Basic Metabolic Panel  Polysubstance dependence including opioid type drug with complication, episodic abuse, with unspecified complication [facility]) Leah Barber is under the care of psychiatry, advised to continue as scheduled. Leah Barber became defensive when discussing opiate medications. Will continue gabapentin as prescribed for pain. Also, recommend Tylenol as directed for pain. Explained to Leah Barber that I will not be prescribing opiate medications. She says, "you just will not prescribe any for me". Exactly. I reiterated care plan with Leah Barber and she expressed understanding. Leah Barber previously completed drug rehabilitation for opiate dependence/abuse.   Follow-up: Return in about 1 month (around 10/10/2019).     [name]  APRN, MSN, FNP-C Leah Barber [facility] [address] [place], [place] [address] [phone]

## 2019-09-09 NOTE — Patient Instructions (Signed)

## 2019-09-10 ENCOUNTER — Telehealth: Payer: Self-pay

## 2019-09-10 ENCOUNTER — Other Ambulatory Visit: Payer: Medicaid Other

## 2019-09-10 ENCOUNTER — Telehealth: Payer: Self-pay | Admitting: Neurology

## 2019-09-10 ENCOUNTER — Other Ambulatory Visit: Payer: Self-pay | Admitting: Family Medicine

## 2019-09-10 LAB — BASIC METABOLIC PANEL
BUN/Creatinine Ratio: 13 (ref 9–23)
BUN: 10 mg/dL (ref 6–24)
CO2: 15 mmol/L — ABNORMAL LOW (ref 20–29)
Calcium: 9 mg/dL (ref 8.7–10.2)
Chloride: 114 mmol/L — ABNORMAL HIGH (ref 96–106)
Creatinine, Ser: 0.8 mg/dL (ref 0.57–1.00)
GFR calc Af Amer: 103 mL/min/{1.73_m2} (ref >59)
GFR calc non Af Amer: 89 mL/min/{1.73_m2} (ref >59)
Glucose: 87 mg/dL (ref 65–99)
Potassium: 4.2 mmol/L (ref 3.5–5.2)
Sodium: 143 mmol/L (ref 134–144)

## 2019-09-10 MED ORDER — GABAPENTIN 400 MG PO CAPS: 800.0000 mg | ORAL_CAPSULE | Freq: Three times a day (TID) | ORAL | 5 refills | Status: DC

## 2019-09-10 NOTE — Telephone Encounter (Signed)
I called to tell the patient GI obtained the approval and would be calling her back to schedule. DWD

## 2019-09-10 NOTE — Telephone Encounter (Signed)
Called and spoke with patient, advised that a1c is 6.1 and that is somewhat elevated from previous. Asked that she take metformin as directed and eat low carb diet over 6 smalls meals daily. Advised that all labs are unremarkable and to follow up as scheduled. Thanks!

## 2019-09-10 NOTE — Progress Notes (Signed)
Meds ordered this encounter  Medications  . gabapentin (NEURONTIN) 400 MG capsule    Sig: Take 2 capsules (800 mg total) by mouth 3 (three) times daily.    Dispense:  90 capsule    Refill:  5    Order Specific Question:   Supervising Provider    Answer:   Tresa Garter [id number]    [name]  APRN, MSN, FNP-C Patient [facility] [address] [place], [place] [address] [phone]

## 2019-09-10 NOTE — Telephone Encounter (Signed)
-----   Message from Dorena Dew, [place] sent at 09/10/2019  6:19 AM EST ----- Regarding: lab results Please inform patient that hemoglobin a1C is 6.1, which is somewhat elevated from previous. Will continue metformin and recommend a low carbohydrate diet divided over small meals throughout the day and increase water intake. All other labs unremarkable. Follow up in office as scheduled.   [name]  APRN, MSN, FNP-C Patient [facility] [address] [place], [place] [address] [phone]

## 2019-09-10 NOTE — Telephone Encounter (Signed)
Pt called and stated the GI cancelled her MRI due to not having authorization. Please advise.

## 2019-09-15 ENCOUNTER — Other Ambulatory Visit (HOSPITAL_COMMUNITY)
Admission: RE | Admit: 2019-09-15 | Discharge: 2019-09-15 | Disposition: A | Discharge status: Home / Self Care | Payer: Medicaid Other | Source: Ambulatory Visit | Attending: Family Medicine | Admitting: Family Medicine

## 2019-09-15 ENCOUNTER — Encounter: Payer: Self-pay | Admitting: Family Medicine

## 2019-09-15 ENCOUNTER — Ambulatory Visit (INDEPENDENT_AMBULATORY_CARE_PROVIDER_SITE_OTHER): Payer: Medicaid Other | Admitting: Family Medicine

## 2019-09-15 ENCOUNTER — Other Ambulatory Visit: Payer: Self-pay

## 2019-09-15 VITALS — BP 107/75 | HR 82 | Wt 201.0 lb

## 2019-09-15 DIAGNOSIS — Z01419 Encounter for gynecological examination (general) (routine) without abnormal findings: Secondary | ICD-10-CM | POA: Insufficient documentation

## 2019-09-15 DIAGNOSIS — N939 Abnormal uterine and vaginal bleeding, unspecified: Secondary | ICD-10-CM

## 2019-09-15 DIAGNOSIS — Z0000 Encounter for general adult medical examination without abnormal findings: Secondary | ICD-10-CM

## 2019-09-15 DIAGNOSIS — Z8742 Personal history of other diseases of the female genital tract: Secondary | ICD-10-CM

## 2019-09-15 MED ORDER — NORETHINDRONE ACETATE 5 MG PO TABS: 15.0000 mg | ORAL_TABLET | Freq: Every day | ORAL | 3 refills | Status: DC

## 2019-09-15 NOTE — Progress Notes (Signed)
GYNECOLOGY ANNUAL PREVENTATIVE CARE ENCOUNTER NOTE  Subjective:   Leah Barber is a 44 y.o. No obstetric history on file. female here for a routine annual gynecologic exam.  Current complaints: AUB. Is on aygestin from OB in [place], [place]. Was on 15mg  daily, which stopped bleeding, but started having breakthrough bleeding when started to wean. Is on 10mg  now. Having cramping and clots. Has been worked up previously with endometrial biopsy.   Denies abnormal vaginal bleeding, discharge, pelvic pain, problems with intercourse or other gynecologic concerns.    Gynecologic History Patient's last menstrual period was 09/14/2019 (exact date). Patient is  sexually active  Last Pap: 2018. Results were: normal Last mammogram: uncertain.  Obstetric History OB History  No obstetric history on file.    Past Medical History:  Diagnosis Date  . Anxiety   . Asthma   . Chronic lower back pain   . Depression   . Diabetes mellitus without complication ([place])   . Hyperlipemia   . Hypertension   . IIH (idiopathic intracranial hypertension)   . Pseudotumor cerebri 08/13/2019    Past Surgical History:  Procedure Laterality Date  . fluid removed from brain  12/20/2018    Current Outpatient Medications on File Prior to Visit  Medication Sig Dispense Refill  . acetaZOLAMIDE (DIAMOX) 250 MG tablet Take 1 tablet (250 mg total) by mouth 3 (three) times daily. 90 tablet 2  . albuterol (PROVENTIL HFA;VENTOLIN HFA) 108 (90 Base) MCG/ACT inhaler Inhale 2 puffs into the lungs every 4 (four) hours as needed for wheezing or shortness of breath. 1 Inhaler 3  . amLODipine (NORVASC) 10 MG tablet Take 1 tablet (10 mg total) by mouth daily. 90 tablet 1  . baclofen (LIORESAL) 10 MG tablet Take 1 tablet (10 mg total) by mouth 3 (three) times daily. 90 each 1  . Brexpiprazole (REXULTI PO) Take by mouth.    . Buprenorphine HCl-Naloxone HCl 8-2 MG FILM Place 1 Film under the tongue 2 (two) times daily.     .  cetirizine (ZYRTEC) 10 MG tablet Take 1 tablet (10 mg total) by mouth daily. 30 tablet 11  . cloNIDine (CATAPRES) 0.1 MG tablet Take 1 tablet (0.1 mg total) by mouth 3 (three) times daily. 60 tablet 1  . CONCERTA 27 MG CR tablet Take 27 mg by mouth every morning.    . DULoxetine (CYMBALTA) 60 MG capsule Take 1 capsule (60 mg total) by mouth 2 (two) times daily. 60 capsule 3  . FLUoxetine (PROZAC) 20 MG capsule Take 1 capsule (20 mg total) by mouth daily. 30 capsule 3  . fluticasone (FLONASE) 50 MCG/ACT nasal spray Place 2 sprays into both nostrils daily. 9.9 g 2  . furosemide (LASIX) 20 MG tablet Take 1 tablet (20 mg total) by mouth daily. 30 tablet 3  . gabapentin (NEURONTIN) 400 MG capsule Take 2 capsules (800 mg total) by mouth 3 (three) times daily. 90 capsule 5  . ibuprofen (ADVIL) 600 MG tablet Take 1 tablet (600 mg total) by mouth every 8 (eight) hours as needed. 90 tablet 3  . metFORMIN (GLUCOPHAGE) 500 MG tablet Take 1 tablet (500 mg total) by mouth 2 (two) times daily with a meal. 180 tablet 1  . miconazole (MICOTIN) 2 % powder Apply topically as needed for itching. 70 g 0  . Multiple Vitamins-Minerals (WOMENS MULTIVITAMIN PO) Take by mouth.    Marland Kitchen omeprazole (PRILOSEC) 40 MG capsule Take 1 capsule (40 mg total) by mouth daily. 90 capsule 1  .  ondansetron (ZOFRAN) 4 MG tablet Take 1 tablet (4 mg total) by mouth every 8 (eight) hours as needed for nausea or vomiting. 30 tablet 0  . Spacer/Aero-Holding Chambers (AEROCHAMBER PLUS WITH MASK) inhaler Use as instructed 1 each 2  . hydrOXYzine (ATARAX/VISTARIL) 25 MG tablet Take 25 mg by mouth 3 (three) times daily as needed.     No current facility-administered medications on file prior to visit.    Allergies  Allergen Reactions  . Clindamycin/Lincomycin     Nausea   . Hydrocodone Nausea And Vomiting    "stomach pain" "stomach pain"  . Morphine Other (See Comments) and Nausea Only    Social History   Socioeconomic History  .  Marital status: Legally Separated    Spouse name: Not on file  . Number of children: Not on file  . Years of education: Not on file  . Highest education level: Not on file  Occupational History  . Not on file  Tobacco Use  . Smoking status: Current Every Day Smoker    Packs/day: 1.00    Types: Cigarettes  . Smokeless tobacco: Never Used  Substance and Sexual Activity  . Alcohol use: No  . Drug use: No  . Sexual activity: Not Currently  Other Topics Concern  . Not on file  Social History Narrative  . Not on file   Social Determinants of Health   Financial Resource Strain:   . Difficulty of Paying Living Expenses: Not on file  Food Insecurity:   . Worried About Charity fundraiser in the Last Year: Not on file  . Ran Out of Food in the Last Year: Not on file  Transportation Needs:   . Lack of Transportation (Medical): Not on file  . Lack of Transportation (Non-Medical): Not on file  Physical Activity:   . Days of Exercise per Week: Not on file  . Minutes of Exercise per Session: Not on file  Stress:   . Feeling of Stress : Not on file  Social Connections:   . Frequency of Communication with Friends and Family: Not on file  . Frequency of Social Gatherings with Friends and Family: Not on file  . Attends Religious Services: Not on file  . Active Member of Clubs or Organizations: Not on file  . Attends Archivist Meetings: Not on file  . Marital Status: Not on file  Intimate Partner Violence:   . Fear of Current or Ex-Partner: Not on file  . Emotionally Abused: Not on file  . Physically Abused: Not on file  . Sexually Abused: Not on file    Family History  Problem Relation Age of Onset  . Diabetes Mother   . Hypertension Mother   . Cancer Father     The following portions of the patient's history were reviewed and updated as appropriate: allergies, current medications, past family history, past medical history, past social history, past surgical history  and problem list.  Review of Systems Pertinent items are noted in HPI.   Objective:  BP 107/75   Pulse 82   Wt 201 lb (91.2 kg)   LMP 09/14/2019 (Exact Date)   BMI 36.76 kg/m  Wt Readings from Last 3 Encounters:  09/15/19 201 lb (91.2 kg)  09/09/19 204 lb (92.5 kg)  08/25/19 203 lb (92.1 kg)     Chaperone present during exam  CONSTITUTIONAL: Well-developed, well-nourished female in no acute distress.  HENT:  Normocephalic, atraumatic, External right and left ear normal. Oropharynx is clear  and moist EYES: Conjunctivae and EOM are normal. Pupils are equal, round, and reactive to light. No scleral icterus.  NECK: Normal range of motion, supple, no masses.  Normal thyroid.   CARDIOVASCULAR: Normal heart rate noted, regular rhythm RESPIRATORY: Clear to auscultation bilaterally. Effort and breath sounds normal, no problems with respiration noted. BREASTS: Symmetric in size. No masses, skin changes, nipple drainage, or lymphadenopathy. ABDOMEN: Soft, normal bowel sounds, no distention noted.  No tenderness, rebound or guarding.  PELVIC: Normal appearing external genitalia; normal appearing vaginal mucosa and cervix.  No abnormal discharge noted.  Normal uterine size, no other palpable masses, no uterine or adnexal tenderness. MUSCULOSKELETAL: Normal range of motion. No tenderness.  No cyanosis, clubbing, or edema.  2+ distal pulses. SKIN: Skin is warm and dry. No rash noted. Not diaphoretic. No erythema. No pallor. NEUROLOGIC: Alert and oriented to person, place, and time. Normal reflexes, muscle tone coordination. No cranial nerve deficit noted. PSYCHIATRIC: Normal mood and affect. Normal behavior. Normal judgment and thought content.  Assessment:  Annual gynecologic examination with pap smear   Plan:  1. Well Woman Exam Will follow up results of pap smear and manage accordingly. Mammogram scheduled STD testing discussed. Patient requested testing - Cervicovaginal ancillary only  - Hepatitis B Surface AntiGEN - Hepatitis C Antibody - RPR - HIV Antibody (routine testing w rflx) - Cytology - PAP( [place])  2. Abnormal uterine bleeding (AUB) Will request records. Patient desires hysterectomy. Update [place]. - US PELVIS TRANSVAGINAL NON-OB (TV ONLY); Future  3. H/O menorrhagia Increase aygestin to 15mg  daily. - norethindrone (AYGESTIN) 5 MG tablet; Take 3 tablets (15 mg total) by mouth daily.  Dispense: 90 tablet; Refill: 3   Routine preventative health maintenance measures emphasized. Please refer to After Visit Summary for other counseling recommendations.    [place], [employer] for [employer]

## 2019-09-16 LAB — CERVICOVAGINAL ANCILLARY ONLY
Bacterial Vaginitis (gardnerella): NEGATIVE
Candida Glabrata: NEGATIVE
Candida Vaginitis: NEGATIVE
Chlamydia: NEGATIVE
Comment: NEGATIVE
Comment: NEGATIVE
Comment: NEGATIVE
Comment: NEGATIVE
Comment: NEGATIVE
Comment: NORMAL
Neisseria Gonorrhea: NEGATIVE
Trichomonas: NEGATIVE

## 2019-09-16 LAB — HEPATITIS C ANTIBODY: Hep C Virus Ab: <0.1 s/co ratio (ref 0.0–0.9)

## 2019-09-16 LAB — HEPATITIS B SURFACE ANTIGEN: Hepatitis B Surface Ag: NEGATIVE

## 2019-09-16 LAB — HIV ANTIBODY (ROUTINE TESTING W REFLEX): HIV Screen 4th Generation wRfx: NONREACTIVE

## 2019-09-16 LAB — RPR: RPR Ser Ql: NONREACTIVE

## 2019-09-17 ENCOUNTER — Other Ambulatory Visit: Payer: Self-pay | Admitting: Oral Surgery

## 2019-09-17 LAB — CYTOLOGY - PAP
Comment: NEGATIVE
Diagnosis: NEGATIVE
High risk HPV: NEGATIVE

## 2019-09-18 DIAGNOSIS — G4719 Other hypersomnia: Secondary | ICD-10-CM | POA: Insufficient documentation

## 2019-09-18 DIAGNOSIS — G4710 Hypersomnia, unspecified: Secondary | ICD-10-CM | POA: Insufficient documentation

## 2019-09-18 NOTE — Progress Notes (Signed)
No organic sleep disorder identified.

## 2019-09-18 NOTE — Procedures (Signed)
PATIENT'S NAME:  Leah Barber, Leah Barber DOB:      1976/02/02      MR#:    [id number]     DATE OF RECORDING: 09/09/2019  [facility] REFERRING M.D.:  Cammie Sickle, FNP Study Performed:   Baseline Polysomnogram HISTORY:  [name] is a 44 year old right-handed black female with a history of onset of blurred vision that occurred  in May 2020.  At that time, she was living in [place].  She went to her eye doctor who told her that she had glaucoma and had papilledema.  She was seen through a neurologist and eventually underwent a CT scan of the head and had a spinal tap done.  She was placed on Diamox, apparently, she was on a fairly high dose of 500 mg 3 times daily.   The patient began having a lot of trouble with excessive daytime drowsiness.  She says she has difficulty sleeping at night, but she has a lot of difficulty staying awake during the day.  Cyclic insomnia. Often times she will sleep sitting up in a recliner.  She underwent a sleep study and was told that she needed CPAP, but this was never set up.  She has moved to this area to be closer to her family. She carried a diagnosis of personality disorder, she has a history of marijuana and cocaine abuse, she still smokes marijuana.  The patient endorsed the Epworth Sleepiness Scale at 21/24 points.   The patient's weight 203 pounds with a height of 62 (inches), resulting in a BMI of 37.3 kg/m2. The patient's neck circumference measured 14 inches.  CURRENT MEDICATIONS: Diamox, Proventil, Norvasc, Lioresal, Rexulti, Zyrtec, Catapres, Concerta, Cymbalta, Prozac, Flonase, Neurontin, Atarax, Advil, Glucophage, Micatin, Aygestin, Prilosec, Zofran.   PROCEDURE:  This is a multichannel digital polysomnogram utilizing the Somnostar 11.2 system.  Electrodes and sensors were applied and monitored per AASM Specifications.   EEG, EOG, Chin and Limb EMG, were sampled at 200 Hz.  ECG, Snore and Nasal Pressure, Thermal Airflow, Respiratory Effort, CPAP Flow and Pressure, Oximetry  was sampled at 50 Hz. Digital video and audio were recorded.      BASELINE STUDY: Lights Out was at 22:07 and Lights On at 05:09.  Total recording time (TRT) was 422.5 minutes, with a total sleep time (TST) of 380.5 minutes.   The patient's sleep latency was 0 minutes. REM latency was 96 minutes. The sleep efficiency was 90.1 %.     SLEEP ARCHITECTURE: WASO (Wake after sleep onset) was 39.5 minutes.  There were 7.5 minutes in Stage N1, 255.5 minutes Stage N2, 0 minutes Stage N3 and 117.5 minutes in Stage REM.  The percentage of Stage N1 was 2.%, Stage N2 was 67.1%, Stage N3 was 0% and Stage R (REM sleep) was 30.9%.    RESPIRATORY ANALYSIS:  There were a total of 10 respiratory events:  1 obstructive apnea, 2 central apneas and 7 hypopneas. The patient also had 0 respiratory event related arousals (RERAs).      The total APNEA/HYPOPNEA INDEX (AHI) was 1.6/hour.  6 events occurred in REM sleep and 5 events in NREM. The REM AHI was  3.1 /hour, versus a non-REM AHI of .9. The patient spent 122.5 minutes of total sleep time in the supine position and 258 minutes in non-supine. The supine AHI was 2.0 versus a non-supine AHI of 1.4.  OXYGEN SATURATION & C02:  The Wake baseline 02 saturation was 0%, with the lowest being 85%. Time spent below 89% saturation equaled 3  minutes.  The arousals were noted as: 35 were spontaneous, 0 were associated with PLMs, 3 were associated with respiratory events. The patient had a total of 0 Periodic Limb Movements Audio and video analysis did not show any abnormal or unusual movements, behaviors, phonations or vocalizations.   The patient took bathroom breaks. EKG was in keeping with normal sinus rhythm (NSR).    IMPRESSION: normal sleep efficiency, no insomnia indicated.   1. No evidence of sleep disordered breathing, no significant apnea, no snoring related arousals.no PLMs.  2. Normal EKG   RECOMMENDATIONS:  Cyclic hypersomnia alternating with insomnia by  history, This is usually related to psychiatric causes.  Hypersomnia can well be related to many of the medications listed.    Advise to test for narcolepsy with HLA test, and only pursue MSLT if positive.     I certify that I have reviewed the entire raw data recording prior to the issuance of this report in accordance with the Standards of Accreditation of the American Academy of Sleep Medicine (AASM)    Larey Seat, MD Diplomat, American Board of Psychiatry and Neurology  Diplomat, American Board of Sleep Medicine Market researcher, [facility] [employer]

## 2019-09-22 ENCOUNTER — Other Ambulatory Visit: Payer: Medicaid Other

## 2019-09-22 ENCOUNTER — Telehealth: Payer: Self-pay | Admitting: Neurology

## 2019-09-22 ENCOUNTER — Other Ambulatory Visit: Payer: Self-pay | Admitting: Neurology

## 2019-09-22 DIAGNOSIS — G4710 Hypersomnia, unspecified: Secondary | ICD-10-CM

## 2019-09-22 DIAGNOSIS — Z820 Family history of epilepsy and other diseases of the nervous system: Secondary | ICD-10-CM

## 2019-09-22 NOTE — Telephone Encounter (Signed)
Called the patient to advise her of the sleep study results. Advised the patient the study didn't indicate sleep apnea or any organic sleep disorder. She had a good sleep efficiency. Advised the patient that the HLA test would be the next step to see if she carries the narcolepsy gene. Advised if this comes back positive then we will have to discuss weaning off of certain medications and substances to determine if narcolepsy is present. Pt verbalized understanding. She will plan to come Thursday around 9-10 am to have this lab work completed.

## 2019-09-23 ENCOUNTER — Other Ambulatory Visit: Payer: Self-pay

## 2019-09-23 ENCOUNTER — Encounter: Payer: Self-pay | Admitting: Physical Therapy

## 2019-09-23 ENCOUNTER — Ambulatory Visit: Payer: Medicaid Other | Attending: Family Medicine | Admitting: Physical Therapy

## 2019-09-23 DIAGNOSIS — G8929 Other chronic pain: Secondary | ICD-10-CM

## 2019-09-23 DIAGNOSIS — M545 Low back pain, unspecified: Secondary | ICD-10-CM

## 2019-09-23 DIAGNOSIS — M25612 Stiffness of left shoulder, not elsewhere classified: Secondary | ICD-10-CM

## 2019-09-23 DIAGNOSIS — M25512 Pain in left shoulder: Secondary | ICD-10-CM | POA: Insufficient documentation

## 2019-09-23 DIAGNOSIS — R293 Abnormal posture: Secondary | ICD-10-CM | POA: Diagnosis present

## 2019-09-23 DIAGNOSIS — R2689 Other abnormalities of gait and mobility: Secondary | ICD-10-CM
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
## 2019-09-25 ENCOUNTER — Other Ambulatory Visit: Payer: Self-pay

## 2019-09-25 ENCOUNTER — Ambulatory Visit
Admission: RE | Admit: 2019-09-25 | Discharge: 2019-09-25 | Disposition: A | Discharge status: Home / Self Care | Payer: Medicaid Other | Source: Ambulatory Visit | Attending: Neurology | Admitting: Neurology

## 2019-09-25 DIAGNOSIS — G932 Benign intracranial hypertension: Secondary | ICD-10-CM

## 2019-09-25 DIAGNOSIS — G4489 Other headache syndrome: Secondary | ICD-10-CM

## 2019-09-29 ENCOUNTER — Telehealth: Payer: Self-pay | Admitting: Neurology

## 2019-09-29 ENCOUNTER — Telehealth: Payer: Self-pay | Admitting: Family Medicine

## 2019-09-29 ENCOUNTER — Other Ambulatory Visit: Payer: Self-pay | Admitting: Family Medicine

## 2019-09-29 DIAGNOSIS — G932 Benign intracranial hypertension: Secondary | ICD-10-CM

## 2019-09-29 NOTE — Telephone Encounter (Signed)
I talked with the patient.  The MRI of the brain is unremarkable, there is evidence of an empty sella.  We will check spinal tap, the patient is on Lasix and Diamox.  She apparently also has a history of glaucoma.  She has been seen through an ophthalmologist here, someone in Dr. Payton Emerald group.   MRI brain 09/29/19:  IMPRESSION: This MRI of the brain without contrast shows the following: 1.   There were no acute findings and the brain parenchyma appears normal. 2.   The pituitary gland is thinned within a normal sized sella turcica (partially empty sella turcica) and the superior orbital veins are enlarged.  These findings can be seen with elevated intracranial pressure such as an idiopathic intracranial hypertension.Marland Kitchen

## 2019-09-30 NOTE — Telephone Encounter (Signed)
Error

## 2019-10-01 ENCOUNTER — Other Ambulatory Visit: Payer: Self-pay

## 2019-10-01 ENCOUNTER — Ambulatory Visit (HOSPITAL_COMMUNITY)
Admission: RE | Admit: 2019-10-01 | Discharge: 2019-10-01 | Disposition: A | Discharge status: Home / Self Care | Payer: Medicaid Other | Source: Ambulatory Visit | Attending: Family Medicine | Admitting: Family Medicine

## 2019-10-01 DIAGNOSIS — N939 Abnormal uterine and vaginal bleeding, unspecified: Secondary | ICD-10-CM | POA: Insufficient documentation

## 2019-10-03 ENCOUNTER — Other Ambulatory Visit: Payer: Self-pay | Admitting: Family Medicine

## 2019-10-03 ENCOUNTER — Ambulatory Visit
Admission: RE | Admit: 2019-10-03 | Discharge: 2019-10-03 | Disposition: A | Discharge status: Home / Self Care | Payer: Medicaid Other | Source: Ambulatory Visit | Attending: Neurology | Admitting: Neurology

## 2019-10-03 ENCOUNTER — Other Ambulatory Visit: Payer: Self-pay

## 2019-10-03 VITALS — BP 120/75 | HR 77

## 2019-10-03 DIAGNOSIS — G932 Benign intracranial hypertension: Secondary | ICD-10-CM

## 2019-10-03 LAB — CSF CELL COUNT WITH DIFFERENTIAL
RBC Count, CSF: 0 cells/uL
WBC, CSF: 0 cells/uL (ref 0–5)

## 2019-10-03 LAB — PROTEIN, CSF: Total Protein, CSF: 53 mg/dL — ABNORMAL HIGH (ref 15–45)

## 2019-10-03 LAB — GLUCOSE, CSF: Glucose, CSF: 87 mg/dL — ABNORMAL HIGH (ref 40–80)

## 2019-10-03 NOTE — Discharge Instructions (Signed)

## 2019-10-05 ENCOUNTER — Telehealth: Payer: Self-pay | Admitting: Neurology

## 2019-10-05 DIAGNOSIS — M79602 Pain in left arm: Secondary | ICD-10-CM

## 2019-10-05 DIAGNOSIS — M48061 Spinal stenosis, lumbar region without neurogenic claudication: Secondary | ICD-10-CM

## 2019-10-05 NOTE — Telephone Encounter (Signed)
I called the patient.  Lumbar puncture was done, the patient has an elevated glucose and protein level, she does have a history of diabetes.  The opening pressure was 18 cm, the patient does not have pseudotumor cerebri.  She currently is on Diamox to 50 mg 3 times daily, she will taper off the medication by 1 tablet every week until off the drug.  Over the last 3 weeks, she has developed pain down the left arm with tingling into the hand.  She also has chronic low back pain with left greater than right lower extremity discomfort.  She has had MRI of the lumbar spine done in [place], she will try to get the report for me to review.  I will get the patient set up for nerve conduction studies of all 4 extremities, EMG of the left arm and left leg.

## 2019-10-06 ENCOUNTER — Other Ambulatory Visit: Payer: Self-pay

## 2019-10-06 ENCOUNTER — Ambulatory Visit (INDEPENDENT_AMBULATORY_CARE_PROVIDER_SITE_OTHER): Payer: Medicaid Other | Admitting: Family Medicine

## 2019-10-06 ENCOUNTER — Encounter: Payer: Self-pay | Admitting: Family Medicine

## 2019-10-06 VITALS — Wt 200.6 lb

## 2019-10-06 DIAGNOSIS — Z23 Encounter for immunization: Secondary | ICD-10-CM

## 2019-10-06 DIAGNOSIS — D251 Intramural leiomyoma of uterus: Secondary | ICD-10-CM

## 2019-10-06 NOTE — Progress Notes (Signed)
PHQ-9 is positive; score is 11. Pt is followed by Ramonita Lab, MD. Virtual counseling every Monday. Declines [place] appt.  Apolonio Schneiders RN 10/06/19

## 2019-10-06 NOTE — Patient Instructions (Signed)
Hysterectomy Information  A hysterectomy is a surgery in which the uterus is removed. The fallopian tubes and ovaries may be removed (bilateral salpingo-oophorectomy) as well. This procedure may be done to treat various medical problems. After the procedure, a woman will no longer have menstrual periods nor will she be able to become pregnant (sterile). What are the reasons for a hysterectomy? There are many reasons why a woman might have this procedure. They include:  Persistent, abnormal vaginal bleeding.  Long-term (chronic) pelvic pain or infection.  Endometriosis. This is when the lining of the uterus (endometrium) starts to grow outside the uterus.  Adenomyosis. This is when the endometrium starts to grow in the muscle of the uterus.  Pelvic organ prolapse. This is a condition in which the uterus falls down into the vagina.  Noncancerous growths in the uterus (uterine fibroids) that cause symptoms.  The presence of precancerous cells.  Cervical or uterine cancer. What are the different types of hysterectomy? There are three different types of hysterectomy:  Supracervical hysterectomy. In this type, the top part of the uterus is removed, but not the cervix.  Total hysterectomy. In this type, the uterus and cervix are removed.  Radical hysterectomy. In this type, the uterus, the cervix, and the tissue that holds the uterus in place (parametrium) are removed. What are the different ways a hysterectomy can be performed? There are many different ways a hysterectomy can be performed, including:  Abdominal hysterectomy. In this type, an incision is made in the abdomen. The uterus is removed through this incision.  Vaginal hysterectomy. In this type, an incision is made in the vagina. The uterus is removed through this incision. There are no abdominal incisions.  Conventional laparoscopic hysterectomy. In this type, three or four small incisions are made in the abdomen. A thin,  lighted tube with a camera (laparoscope) is inserted into one of the incisions. Other tools are put through the other incisions. The uterus is cut into small pieces. The small pieces are removed through the incisions or through the vagina.  Laparoscopically assisted vaginal hysterectomy (LAVH). In this type, three or four small incisions are made in the abdomen. Part of the surgery is performed laparoscopically and the other part is done vaginally. The uterus is removed through the vagina.  Robot-assisted laparoscopic hysterectomy. In this type, a laparoscope and other tools are inserted into three or four small incisions in the abdomen. A computer-controlled device is used to give the surgeon a 3D image and to help control the surgical instruments. This allows for more precise movements of surgical instruments. The uterus is cut into small pieces and removed through the incisions or removed through the vagina. Discuss the options with your health care provider to determine which type is the right one for you. What are the risks? Generally, this is a safe procedure. However, problems may occur, including:  Bleeding and risk of blood transfusion. Tell your health care provider if you do not want to receive any blood products.  Blood clots in the legs or lung.  Infection.  Damage to other structures or organs.  Allergic reactions to medicines.  Changing to an abdominal hysterectomy from one of the other techniques. What to expect after a hysterectomy  You will be given pain medicine.  You may need to stay in the hospital for 1- 2 days to recover, depending on the type of hysterectomy you had.  Follow your health care provider's instructions about exercise, driving, and general activities. Ask your   health care provider what activities are safe for you.  You will need to have someone with you for the first 3-5 days after you go home.  You will need to follow up with your surgeon in 2-4  weeks after surgery to evaluate your progress.  If the ovaries are removed, you will have early menopause symptoms such as hot flashes, night sweats, and insomnia.  If you had a hysterectomy for a problem that was not cancer or not a condition that could lead to cancer, then you no longer need Pap tests. However, even if you no longer need a Pap test, a regular pelvic exam is a good idea to make sure no other problems are developing. Questions to ask your health care provider  Is a hysterectomy medically necessary? Do I have other treatment options for my condition?  What are my options for hysterectomy procedure?  What organs and tissues need to be removed?  What are the risks?  What are the benefits?  How long will I need to stay in the hospital after the procedure?  How long will I need to recover at home?  What symptoms can I expect after the procedure? Summary  A hysterectomy is a surgery in which the uterus is removed. The fallopian tubes and ovaries may be removed (bilateral salpingo-oophorectomy) as well.  This procedure may be done to treat various medical problems. After the procedure, a woman will no longer have menstrual periods nor will she be able to become pregnant.  Discuss the options with your health care provider to determine which type of hysterectomy is the right one for you. This information is not intended to replace advice given to you by your health care provider. Make sure you discuss any questions you have with your health care provider. Document Revised: 07/20/2017 Document Reviewed: 09/13/2016 Elsevier Patient Education  2020 Elsevier Inc.  

## 2019-10-06 NOTE — Assessment & Plan Note (Signed)
States it is causing her bleeding and pain. Has long h/o pain. Reports EMB in [place]--must have records prior to booking for definitive surgery. Given 4 prior laparotomies, hysterectomy is not without risks. Specifically discussed ureteral, bladder, bowel injury, need for colostomy, bleeding, infection. Despite all of this she declines UFE or other. Will ask about possible RATH, or will book for TAH, once we have records.

## 2019-10-06 NOTE — Telephone Encounter (Signed)
Patient called back to schedule her HLA test.

## 2019-10-06 NOTE — Progress Notes (Signed)
    Subjective:    Patient ID: Kacelyn Fiest is a 44 y.o. female presenting with Follow-up  on 10/06/2019  HPI: Has h/o fibroids, just one 5 cm one right now.. On aygestin 15 mg daily and has had some breakthrough bleeding related to this. Desires permanent treatment with hysterectomy. 3 prior C-sections with ex lap (vertical incision) and ovarian cystectomy for dermoid.  Review of Systems  Constitutional: Negative for chills and fever.  Respiratory: Negative for shortness of breath.   Cardiovascular: Negative for chest pain.  Gastrointestinal: Negative for abdominal pain, nausea and vomiting.  Genitourinary: Negative for dysuria.  Skin: Negative for rash.      Objective:    Wt 200 lb 9.6 oz (91 kg)   LMP 09/14/2019 (Approximate)   BMI 36.69 kg/m  Physical Exam Constitutional:      General: She is not in acute distress.    Appearance: She is well-developed.  HENT:     Head: Normocephalic and atraumatic.  Eyes:     General: No scleral icterus. Cardiovascular:     Rate and Rhythm: Normal rate.  Pulmonary:     Effort: Pulmonary effort is normal.  Abdominal:     Palpations: Abdomen is soft.  Musculoskeletal:     Cervical back: Neck supple.  Skin:    General: Skin is warm and dry.  Neurological:     Mental Status: She is alert and oriented to person, place, and time.    Uterus Measurements: 8.6 x 6.8 x 6.6 cm = volume: 199 mL. Anteverted. Large transmural leiomyoma at posterior upper uterus 5.8 x 5.1 x 5.9 cm. No additional uterine masses.  Endometrium Thickness: 3 mm. Tiny amount of endometrial fluid at upper uterine segment endometrial canal. No focal mass.  Right ovary Measurements: 2.0 x 1.6 x 1.9 cm = volume: 3.2 mL. Normal morphology without mass  Left ovary Measurements: 2.4 x 2.4 x 2.1 cm = volume: 6.3 mL. Normal morphology without mass  Other findings:  No free pelvic fluid.  No adnexal masses.  IMPRESSION: Large transmural leiomyoma at  posterior aspect of upper uterus 5.9 cm greatest size. Tiny amount of nonspecific endometrial fluid, likely related to compression of the upper to mid endometrial complex by submucosal extension of the large leiomyoma.     Assessment & Plan:   Problem List Items Addressed This Visit      Unprioritized   Uterine fibroid    States it is causing her bleeding and pain. Has long h/o pain. Reports [facility] in [place]--must have records prior to booking for definitive surgery. Given 4 prior laparotomies, hysterectomy is not without risks. Specifically discussed ureteral, bladder, bowel injury, need for colostomy, bleeding, infection. Despite all of this she declines UFE or other. Will ask about possible RATH, or will book for T[facility], once we have records.       Other Visit Diagnoses    Need for Tdap vaccination    -  Primary   Relevant Orders   Tdap vaccine greater than or equal to 7yo IM (Completed)      Return in about 3 months (around 01/03/2020) for release of information--, postop check.  Donnamae Jude 10/06/2019 4:54 PM

## 2019-10-06 NOTE — Telephone Encounter (Signed)
Called the patient back. Advised the patient that she doesn't have to have to schedule an apt to have the lab drawn. Patient asked for address to clinic provided that for her and advised she can come in between 8-5 and that lab lunch is taken 12:00-1:15pm time frame.she verbalized understanding.

## 2019-10-08 ENCOUNTER — Ambulatory Visit: Payer: Medicaid Other | Admitting: Physical Therapy

## 2019-10-09 ENCOUNTER — Other Ambulatory Visit: Payer: Self-pay | Admitting: Family Medicine

## 2019-10-09 NOTE — Telephone Encounter (Signed)
Patient requesting a 90 day supply of Duloxetine 60 MG CAP.

## 2019-10-15 ENCOUNTER — Other Ambulatory Visit: Payer: Self-pay

## 2019-10-15 ENCOUNTER — Encounter (HOSPITAL_COMMUNITY): Payer: Self-pay | Admitting: *Deleted

## 2019-10-15 ENCOUNTER — Emergency Department (HOSPITAL_COMMUNITY)
Admission: EM | Admit: 2019-10-15 | Discharge: 2019-10-16 | Disposition: A | Discharge status: Home / Self Care | Payer: Medicaid Other | Attending: Emergency Medicine | Admitting: Emergency Medicine

## 2019-10-15 ENCOUNTER — Ambulatory Visit: Payer: Medicaid Other | Admitting: Physical Therapy

## 2019-10-15 DIAGNOSIS — R410 Disorientation, unspecified: Secondary | ICD-10-CM | POA: Diagnosis present

## 2019-10-15 DIAGNOSIS — R293 Abnormal posture: Secondary | ICD-10-CM

## 2019-10-15 DIAGNOSIS — Z5321 Procedure and treatment not carried out due to patient leaving prior to being seen by health care provider: Secondary | ICD-10-CM | POA: Insufficient documentation

## 2019-10-15 DIAGNOSIS — G8929 Other chronic pain: Secondary | ICD-10-CM

## 2019-10-15 DIAGNOSIS — M25512 Pain in left shoulder: Secondary | ICD-10-CM

## 2019-10-15 DIAGNOSIS — M545 Low back pain, unspecified: Secondary | ICD-10-CM

## 2019-10-15 DIAGNOSIS — R2689 Other abnormalities of gait and mobility: Secondary | ICD-10-CM

## 2019-10-15 DIAGNOSIS — M25612 Stiffness of left shoulder, not elsewhere classified: Secondary | ICD-10-CM

## 2019-10-15 LAB — CBC
HCT: 39.4 % (ref 36.0–46.0)
Hemoglobin: 12.6 g/dL (ref 12.0–15.0)
MCH: 31.2 pg (ref 26.0–34.0)
MCHC: 32 g/dL (ref 30.0–36.0)
MCV: 97.5 fL (ref 80.0–100.0)
Platelets: 288 10*3/uL (ref 150–400)
RBC: 4.04 MIL/uL (ref 3.87–5.11)
RDW: 12.9 % (ref 11.5–15.5)
WBC: 9.1 10*3/uL (ref 4.0–10.5)
nRBC: 0 % (ref 0.0–0.2)

## 2019-10-15 LAB — BASIC METABOLIC PANEL
Anion gap: 11 (ref 5–15)
BUN: 9 mg/dL (ref 6–20)
CO2: 16 mmol/L — ABNORMAL LOW (ref 22–32)
Calcium: 8.7 mg/dL — ABNORMAL LOW (ref 8.9–10.3)
Chloride: 114 mmol/L — ABNORMAL HIGH (ref 98–111)
Creatinine, Ser: 0.94 mg/dL (ref 0.44–1.00)
GFR calc Af Amer: >60 mL/min (ref >60)
GFR calc non Af Amer: >60 mL/min (ref >60)
Glucose, Bld: 155 mg/dL — ABNORMAL HIGH (ref 70–99)
Potassium: 3.7 mmol/L (ref 3.5–5.1)
Sodium: 141 mmol/L (ref 135–145)

## 2019-10-15 LAB — I-STAT BETA HCG BLOOD, ED (MC, WL, AP ONLY): I-stat hCG, quantitative: <5 m[IU]/mL (ref <5)

## 2019-10-15 LAB — TROPONIN I (HIGH SENSITIVITY): Troponin I (High Sensitivity): 2 ng/L (ref <18)

## 2019-10-15 MED ORDER — SODIUM CHLORIDE 0.9% FLUSH: 3.0000 mL | Freq: Once | INTRAVENOUS | Status: DC

## 2019-10-15 NOTE — ED Triage Notes (Addendum)
On Sat pt was sleeping in armchair, stood up, her legs went out from under her, and she hit the back of her head on a granite counter.  Pt was getting physical therapy today for radiculopathy and her arm went numb.  She also feels "burning" to the back of her head.  Pt states sob with LE edema and chest pain.

## 2019-10-15 NOTE — ED Notes (Signed)
Called pt for X-ray.  Unable to locate her in the waiting room, triage or bathrooms.

## 2019-10-15 NOTE — Therapy (Addendum)
[place], [place], [address] Phone: [phone]   Fax:  [phone]  Physical Therapy Treatment?Discharge   Patient Details  Name: Leah Barber MRN: [id number] Date of Birth: 18-May-1976 Referring Provider (PT): Cammie Sickle FNP    Encounter Date: 10/15/2019  PT End of Session - 10/15/19 1343    Visit Number  2    Number of Visits  4    Date for PT Re-Evaluation  10/14/19    Authorization Type  [employer]    PT Start Time  1338   Patient 8 minuts late for therapy   PT Stop Time  1400   adverse reaction to light soft tissue mobilization   PT Time Calculation (min)  22 min    Activity Tolerance  Patient tolerated treatment well    Behavior During Therapy  [facility] for tasks assessed/performed       Past Medical History:  Diagnosis Date  . Anxiety   . Asthma   . Chronic lower back pain   . Depression   . Diabetes mellitus without complication ([place])   . Hyperlipemia   . Hypertension   . IIH (idiopathic intracranial hypertension)   . Pseudotumor cerebri 08/13/2019    Past Surgical History:  Procedure Laterality Date  . CESAREAN SECTION     x 3  . fluid removed from brain  12/20/2018    There were no vitals filed for this visit.  Subjective Assessment - 10/15/19 1344    Subjective  Patient had a fall Saturday. She hit the back of her head. She is sore all over from the fall. She just lost her balance. Her left shoulder has been alright. She reports she has been dizzy since the fall. She was advised she should be checked out from her MD    Diagnostic tests  Per patient; x-ray from [place] hernated disc at L4-:L5 L5-S1    Patient Stated Goals  to have less pain    Currently in Pain?  Yes    Pain Score  5     Pain Orientation  Right;Left    Pain Descriptors / Indicators  Aching    Pain Type  Chronic pain    Pain Onset  More than a month ago    Pain Frequency  Constant    Aggravating Factors    use of the arm                       [facility] Adult PT Treatment/Exercise - 10/15/19 0001      Manual Therapy   Manual therapy comments  patient perfromed light soft tissue mobilization to the patients upper trap and posterior shoulder. She reported increased numbness into her fingers. The patient stated she felt like something wasn't right. She had a fall on Saturday and has had increased pain and soreness. The patients repsoses seemed more delayed then her initial eval but her responeses are delayed at baseline. manual therapy halted and patient advised to seek medical attention.                PT Short Term Goals - 09/23/19 1505      PT SHORT TERM GOAL #1   Title  Patient will increase left cervical rotation by 20 degrees    Baseline  20 degrees at baseline    Time  3    Period  Weeks    Status  New    Target Date  10/14/19  dependence including opioid type drug with complication, episodic abuse, with unspecified complication ([place]) 51/09/5850  . Depression 09/13/2015  . Cluster B personality disorder ([place]) 05/15/2014  . Dependence on nicotine from cigarettes 05/15/2014  . Substance induced mood disorder ([place]) 05/15/2014  . Benzodiazepine abuse ([place]) 09/20/2011  . Cannabis abuse 05/04/2011  . Cocaine abuse ([place]) 05/04/2011  . Polysubstance dependence ([place]) 05/04/2011  . Candidiasis of vagina 04/30/2011  . Chronic pain in right shoulder 04/30/2011  . Depressive disorder, not elsewhere classified 04/30/2011  . Otitis media, acute 04/30/2011    PHYSICAL THERAPY DISCHARGE SUMMARY  Visits from Start of Care: 2  Current functional level related to goals / functional outcomes: Continued pain with all activity   Remaining deficits: Continued to have pain    Education / Equipment: HEP  Plan: Patient agrees to discharge.  Patient goals were not met. Patient is being discharged due to not returning since the last visit.  ?????       Carney Living PT DPT  10/15/2019, 2:08 PM  [facility] [address] [place], [place], [address] Phone: [phone]   Fax:  [phone]  Name: Leah Barber MRN: [id number] Date of Birth: 09-Jul-1976  [place], [place], [address] Phone: [phone]   Fax:  [phone]  Physical Therapy Treatment?Discharge   Patient Details  Name: Leah Barber MRN: [id number] Date of Birth: 18-May-1976 Referring Provider (PT): Cammie Sickle FNP    Encounter Date: 10/15/2019  PT End of Session - 10/15/19 1343    Visit Number  2    Number of Visits  4    Date for PT Re-Evaluation  10/14/19    Authorization Type  [employer]    PT Start Time  1338   Patient 8 minuts late for therapy   PT Stop Time  1400   adverse reaction to light soft tissue mobilization   PT Time Calculation (min)  22 min    Activity Tolerance  Patient tolerated treatment well    Behavior During Therapy  [facility] for tasks assessed/performed       Past Medical History:  Diagnosis Date  . Anxiety   . Asthma   . Chronic lower back pain   . Depression   . Diabetes mellitus without complication ([place])   . Hyperlipemia   . Hypertension   . IIH (idiopathic intracranial hypertension)   . Pseudotumor cerebri 08/13/2019    Past Surgical History:  Procedure Laterality Date  . CESAREAN SECTION     x 3  . fluid removed from brain  12/20/2018    There were no vitals filed for this visit.  Subjective Assessment - 10/15/19 1344    Subjective  Patient had a fall Saturday. She hit the back of her head. She is sore all over from the fall. She just lost her balance. Her left shoulder has been alright. She reports she has been dizzy since the fall. She was advised she should be checked out from her MD    Diagnostic tests  Per patient; x-ray from [place] hernated disc at L4-:L5 L5-S1    Patient Stated Goals  to have less pain    Currently in Pain?  Yes    Pain Score  5     Pain Orientation  Right;Left    Pain Descriptors / Indicators  Aching    Pain Type  Chronic pain    Pain Onset  More than a month ago    Pain Frequency  Constant    Aggravating Factors    use of the arm                       [facility] Adult PT Treatment/Exercise - 10/15/19 0001      Manual Therapy   Manual therapy comments  patient perfromed light soft tissue mobilization to the patients upper trap and posterior shoulder. She reported increased numbness into her fingers. The patient stated she felt like something wasn't right. She had a fall on Saturday and has had increased pain and soreness. The patients repsoses seemed more delayed then her initial eval but her responeses are delayed at baseline. manual therapy halted and patient advised to seek medical attention.                PT Short Term Goals - 09/23/19 1505      PT SHORT TERM GOAL #1   Title  Patient will increase left cervical rotation by 20 degrees    Baseline  20 degrees at baseline    Time  3    Period  Weeks    Status  New    [place] Date  10/14/19

## 2019-10-15 NOTE — ED Notes (Signed)
Pt named called again for xray, still no response. Sort nurse notified

## 2019-10-15 NOTE — ED Notes (Signed)
Pt name called for xray, no response

## 2019-10-22 ENCOUNTER — Ambulatory Visit: Payer: Medicaid Other | Admitting: Physical Therapy

## 2019-10-26 ENCOUNTER — Emergency Department (HOSPITAL_COMMUNITY): Payer: Medicaid Other

## 2019-10-26 ENCOUNTER — Encounter (HOSPITAL_COMMUNITY): Payer: Self-pay

## 2019-10-26 ENCOUNTER — Emergency Department (HOSPITAL_COMMUNITY)
Admission: EM | Admit: 2019-10-26 | Discharge: 2019-10-26 | Disposition: A | Discharge status: Home / Self Care | Payer: Medicaid Other | Attending: Emergency Medicine | Admitting: Emergency Medicine

## 2019-10-26 ENCOUNTER — Other Ambulatory Visit: Payer: Self-pay

## 2019-10-26 DIAGNOSIS — Z20822 Contact with and (suspected) exposure to covid-19: Secondary | ICD-10-CM | POA: Insufficient documentation

## 2019-10-26 DIAGNOSIS — R079 Chest pain, unspecified: Secondary | ICD-10-CM

## 2019-10-26 DIAGNOSIS — R05 Cough: Secondary | ICD-10-CM | POA: Diagnosis not present

## 2019-10-26 DIAGNOSIS — Y9389 Activity, other specified: Secondary | ICD-10-CM | POA: Insufficient documentation

## 2019-10-26 DIAGNOSIS — Y92000 Kitchen of unspecified non-institutional (private) residence as  the place of occurrence of the external cause: Secondary | ICD-10-CM | POA: Diagnosis not present

## 2019-10-26 DIAGNOSIS — Y999 Unspecified external cause status: Secondary | ICD-10-CM | POA: Insufficient documentation

## 2019-10-26 DIAGNOSIS — F17210 Nicotine dependence, cigarettes, uncomplicated: Secondary | ICD-10-CM | POA: Diagnosis not present

## 2019-10-26 DIAGNOSIS — R071 Chest pain on breathing: Secondary | ICD-10-CM | POA: Diagnosis not present

## 2019-10-26 DIAGNOSIS — E119 Type 2 diabetes mellitus without complications: Secondary | ICD-10-CM | POA: Diagnosis not present

## 2019-10-26 DIAGNOSIS — R4182 Altered mental status, unspecified: Secondary | ICD-10-CM | POA: Diagnosis not present

## 2019-10-26 DIAGNOSIS — I10 Essential (primary) hypertension: Secondary | ICD-10-CM | POA: Insufficient documentation

## 2019-10-26 DIAGNOSIS — R0602 Shortness of breath: Secondary | ICD-10-CM | POA: Insufficient documentation

## 2019-10-26 DIAGNOSIS — J45909 Unspecified asthma, uncomplicated: Secondary | ICD-10-CM | POA: Insufficient documentation

## 2019-10-26 DIAGNOSIS — S0990XA Unspecified injury of head, initial encounter: Secondary | ICD-10-CM | POA: Insufficient documentation

## 2019-10-26 DIAGNOSIS — W010XXA Fall on same level from slipping, tripping and stumbling without subsequent striking against object, initial encounter: Secondary | ICD-10-CM | POA: Insufficient documentation

## 2019-10-26 DIAGNOSIS — Z7984 Long term (current) use of oral hypoglycemic drugs: Secondary | ICD-10-CM | POA: Diagnosis not present

## 2019-10-26 DIAGNOSIS — R0789 Other chest pain: Secondary | ICD-10-CM | POA: Insufficient documentation

## 2019-10-26 LAB — TROPONIN I (HIGH SENSITIVITY)
Troponin I (High Sensitivity): 3 ng/L (ref <18)
Troponin I (High Sensitivity): 3 ng/L (ref <18)

## 2019-10-26 LAB — CBC WITH DIFFERENTIAL/PLATELET
Abs Immature Granulocytes: 0.04 10*3/uL (ref 0.00–0.07)
Basophils Absolute: 0.1 10*3/uL (ref 0.0–0.1)
Basophils Relative: 1 %
Eosinophils Absolute: 0.5 10*3/uL (ref 0.0–0.5)
Eosinophils Relative: 6 %
HCT: 43.9 % (ref 36.0–46.0)
Hemoglobin: 14 g/dL (ref 12.0–15.0)
Immature Granulocytes: 0 %
Lymphocytes Relative: 31 %
Lymphs Abs: 2.8 10*3/uL (ref 0.7–4.0)
MCH: 31.1 pg (ref 26.0–34.0)
MCHC: 31.9 g/dL (ref 30.0–36.0)
MCV: 97.6 fL (ref 80.0–100.0)
Monocytes Absolute: 0.6 10*3/uL (ref 0.1–1.0)
Monocytes Relative: 7 %
Neutro Abs: 5 10*3/uL (ref 1.7–7.7)
Neutrophils Relative %: 55 %
Platelets: 267 10*3/uL (ref 150–400)
RBC: 4.5 MIL/uL (ref 3.87–5.11)
RDW: 12.1 % (ref 11.5–15.5)
WBC: 9.1 10*3/uL (ref 4.0–10.5)
nRBC: 0 % (ref 0.0–0.2)

## 2019-10-26 LAB — COMPREHENSIVE METABOLIC PANEL
ALT: 29 U/L (ref 0–44)
AST: 26 U/L (ref 15–41)
Albumin: 3.7 g/dL (ref 3.5–5.0)
Alkaline Phosphatase: 78 U/L (ref 38–126)
Anion gap: 6 (ref 5–15)
BUN: 10 mg/dL (ref 6–20)
CO2: 27 mmol/L (ref 22–32)
Calcium: 8.8 mg/dL — ABNORMAL LOW (ref 8.9–10.3)
Chloride: 108 mmol/L (ref 98–111)
Creatinine, Ser: 0.52 mg/dL (ref 0.44–1.00)
GFR calc Af Amer: >60 mL/min (ref >60)
GFR calc non Af Amer: >60 mL/min (ref >60)
Glucose, Bld: 114 mg/dL — ABNORMAL HIGH (ref 70–99)
Potassium: 3.3 mmol/L — ABNORMAL LOW (ref 3.5–5.1)
Sodium: 141 mmol/L (ref 135–145)
Total Bilirubin: 0.5 mg/dL (ref 0.3–1.2)
Total Protein: 7.4 g/dL (ref 6.5–8.1)

## 2019-10-26 LAB — BRAIN NATRIURETIC PEPTIDE: B Natriuretic Peptide: 41.6 pg/mL (ref 0.0–100.0)

## 2019-10-26 LAB — D-DIMER, QUANTITATIVE: D-Dimer, Quant: 0.78 ug/mL-FEU — ABNORMAL HIGH (ref 0.00–0.50)

## 2019-10-26 MED ORDER — SODIUM CHLORIDE (PF) 0.9 % IJ SOLN
INTRAMUSCULAR | Status: AC
  Filled 2019-10-26: qty 50

## 2019-10-26 MED ORDER — IOHEXOL 350 MG/ML SOLN
80.0000 mL | Freq: Once | INTRAVENOUS | Status: AC | PRN
  Administered 2019-10-26: 80 mL via INTRAVENOUS

## 2019-10-26 MED ORDER — ALBUTEROL SULFATE HFA 108 (90 BASE) MCG/ACT IN AERS
8.0000 | INHALATION_SPRAY | RESPIRATORY_TRACT | Status: DC | PRN
  Administered 2019-10-26: 15:00:00 8 via RESPIRATORY_TRACT
  Filled 2019-10-26: qty 6.7

## 2019-10-26 NOTE — ED Provider Notes (Signed)
Signout from Dr. Maryan Rued.  44 year old with history of diabetes asthma here with pleuritic chest pain and worsening shortness of breath with a nonproductive cough.  She is pending labs including a D-dimer.  Will need a delta troponin.  Anticipate discharge if no significant abnormalities. Physical Exam  BP (!) 156/97   Pulse 73   Temp 99.2 F (37.3 C) (Oral)   Resp 19   Ht 5\' 2"  (1.575 m)   Wt 90.7 kg   SpO2 96%   BMI 36.58 kg/m   Physical Exam  ED Course/Procedures     Procedures  MDM  Patient's D-dimer is elevated.  I reviewed this with her and let her know that she needs a second troponin and will put her in for a CAT scan to evaluate for PE.  CT negative for PE, delta troponin unchanged. Reviewed with patient, she is comfortable with discharge and understands to followup with her pcp.        Hayden Rasmussen, MD 10/27/19 1002

## 2019-10-26 NOTE — ED Triage Notes (Addendum)
Patient arrived via EMS from home  Woke up with difficulty breathing this AM Has had chest pain going on the past few weeks - pain increases with inspiration Pneumonia dx a few months ago - took all abx  hx: asthma Patient does NOT wear a mask - told EMS  80 HR 130/80  97% RA   Current every day smoker.

## 2019-10-26 NOTE — Discharge Instructions (Signed)
You were seen in the emergency department for evaluation of chest pain and shortness of breath.  You had blood work EKG chest x-ray and a CAT scan of your chest that did not show any serious findings.  Please contact your primary care doctor for close follow-up.  Return to the emergency department if any acute worsening or concerning symptoms.

## 2019-10-26 NOTE — ED Provider Notes (Signed)
[facility] DEPT Provider Note   CSN: [id number] Arrival date & time: 10/26/19  1353     History No chief complaint on file.   Leah Barber is a 44 y.o. female.  Patient is a 44 year old female with a history of hypertension, hyperlipidemia, diabetes, asthma, IIH presenting today with complaints of intermittent pleuritic type chest pain, worsening shortness of breath and nonproductive cough.  Patient states symptoms of shortness of breath have been going on for a while but the last 2 days she has noticed more episodes of intermittent pleuritic type chest pain and worsening shortness of breath.  Orthopnea present this morning but also has dyspnea on exertion.  She denies any significant weight gain lately and has not had increased swelling in her lower extremities.  She denies fever or productive cough.  She has been wheezing intermittently at home but has not had an inhaler for a while.  She does still smoke cigarettes daily.  She has had some nausea but denies any vomiting.  Patient reports for the last 2 weeks since she fell and hit her head she has had some moments of staring off in space and just not feeling herself.  She has also had intermittent headaches.  The fall was when she slipped and fell backward in her kitchen 2 weeks ago.  She was seen in the emergency room and had imaging ordered but she had to leave because of a family emergency.  She reports the pain in her neck has improved and the area in her head is gotten better but these other episodes are still occurring.  She does not take anticoagulation.  No history of heart attack or stent placement.  She does take Lasix daily.  The history is provided by the patient.       Past Medical History:  Diagnosis Date  . Anxiety   . Asthma   . Chronic lower back pain   . Depression   . Diabetes mellitus without complication ([place])   . Hyperlipemia   . Hypertension   . IIH (idiopathic intracranial  hypertension)   . Pseudotumor cerebri 08/13/2019    Patient Active Problem List   Diagnosis Date Noted  . Excessive daytime sleepiness 09/18/2019  . Hypersomnia with sleep apnea 09/18/2019  . Chronic pain syndrome 09/09/2019  . Pseudotumor cerebri 08/13/2019  . Nonintractable headache 07/07/2019  . History of idiopathic intracranial hypertension 07/07/2019  . Intertrigo 07/07/2019  . Environmental allergies 07/07/2019  . Uterine fibroid 07/07/2019  . Chronic bronchitis ([place]) 07/02/2019  . Polysubstance dependence including opioid type drug with complication, episodic abuse, with unspecified complication ([place]) [id number]  . Depression 09/13/2015  . Cluster B personality disorder ([place]) 05/15/2014  . Dependence on nicotine from cigarettes 05/15/2014  . Substance induced mood disorder ([place]) 05/15/2014  . Benzodiazepine abuse ([place]) 09/20/2011  . Cannabis abuse 05/04/2011  . Cocaine abuse ([place]) 05/04/2011  . Polysubstance dependence ([place]) 05/04/2011  . Candidiasis of vagina 04/30/2011  . Chronic pain in right shoulder 04/30/2011  . Depressive disorder, not elsewhere classified 04/30/2011  . Otitis media, acute 04/30/2011    Past Surgical History:  Procedure Laterality Date  . CESAREAN SECTION     x 3  . fluid removed from brain  12/20/2018     OB History    Gravida  4   Para  3   Term      Preterm      AB  1   Living  SAB  1   TAB      Ectopic      Multiple      Live Births              Family History  Problem Relation Age of Onset  . Diabetes Mother   . Hypertension Mother   . Cancer Father     Social History   Tobacco Use  . Smoking status: Current Every Day Smoker    Packs/day: 1.00    Types: Cigarettes  . Smokeless tobacco: Never Used  Substance Use Topics  . Alcohol use: No  . Drug use: No    Home Medications Prior to Admission medications   Medication Sig Start Date End Date Taking? Authorizing Provider  acetaZOLAMIDE  (DIAMOX) 250 MG tablet Take 1 tablet (250 mg total) by mouth 3 (three) times daily. 08/04/19   Dorena Dew, FNP  albuterol (PROVENTIL HFA;VENTOLIN HFA) 108 (90 Base) MCG/ACT inhaler Inhale 2 puffs into the lungs every 4 (four) hours as needed for wheezing or shortness of breath. 06/20/17   Scot Jun, FNP  amLODipine (NORVASC) 10 MG tablet Take 1 tablet (10 mg total) by mouth daily. 07/09/19   Dorena Dew, FNP  baclofen (LIORESAL) 10 MG tablet Take 1 tablet (10 mg total) by mouth 3 (three) times daily. 06/12/19   Dorena Dew, FNP  Brexpiprazole (REXULTI PO) Take 2 mg by mouth at bedtime.     [provider]  Buprenorphine HCl-Naloxone HCl 8-2 MG FILM Place 1 Film under the tongue 2 (two) times daily.     [provider]  cetirizine (ZYRTEC) 10 MG tablet Take 1 tablet (10 mg total) by mouth daily. 07/16/19   Dorena Dew, FNP  cloNIDine (CATAPRES) 0.1 MG tablet TAKE 1 TABLET BY MOUTH THREE TIMES A DAY 09/29/19   Dorena Dew, FNP  CONCERTA 27 MG CR tablet Take 27 mg by mouth every morning. 07/03/19   [provider]  DULoxetine (CYMBALTA) 60 MG capsule TAKE 1 CAPSULE (60 MG TOTAL) BY MOUTH 2 (TWO) TIMES DAILY. 10/10/19   Dorena Dew, FNP  FLUoxetine (PROZAC) 20 MG capsule Take 1 capsule (20 mg total) by mouth daily. 05/28/19   Dorena Dew, FNP  fluticasone (FLONASE) 50 MCG/ACT nasal spray Place 2 sprays into both nostrils daily. 07/02/19   Dorena Dew, FNP  furosemide (LASIX) 20 MG tablet TAKE 1 TABLET BY MOUTH EVERY DAY 10/03/19   Dorena Dew, FNP  gabapentin (NEURONTIN) 400 MG capsule Take 2 capsules (800 mg total) by mouth 3 (three) times daily. 09/10/19   Dorena Dew, FNP  ibuprofen (ADVIL) 600 MG tablet Take 1 tablet (600 mg total) by mouth every 8 (eight) hours as needed. 08/21/19   Dorena Dew, FNP  metFORMIN (GLUCOPHAGE) 500 MG tablet Take 1 tablet (500 mg total) by mouth 2 (two) times daily with a  meal. 07/09/19   Dorena Dew, FNP  miconazole (MICOTIN) 2 % powder Apply topically as needed for itching. Patient not taking: Reported on 09/23/2019 07/07/19   Dorena Dew, FNP  Multiple Vitamins-Minerals (WOMENS MULTIVITAMIN PO) Take by mouth.    [provider]  norethindrone (AYGESTIN) 5 MG tablet Take 3 tablets (15 mg total) by mouth daily. 09/15/19 10/15/19  Truett Mainland, DO  omeprazole (PRILOSEC) 40 MG capsule Take 1 capsule (40 mg total) by mouth daily. 08/05/19   Dorena Dew, FNP  ondansetron (ZOFRAN) 4 MG tablet  Take 1 tablet (4 mg total) by mouth every 8 (eight) hours as needed for nausea or vomiting. 08/05/19   Dorena Dew, FNP  Spacer/Aero-Holding Chambers (AEROCHAMBER PLUS WITH MASK) inhaler Use as instructed 12/11/16   Muthersbaugh, Jarrett Soho, PA-C    Allergies    Clindamycin/lincomycin, Hydrocodone, and Morphine  Review of Systems   Review of Systems  All other systems reviewed and are negative.   Physical Exam Updated Vital Signs BP (!) 162/145   Pulse 61   Temp 99.2 F (37.3 C) (Oral)   Resp 20   Ht 5\' 2"  (1.575 m)   Wt 90.7 kg   SpO2 96%   BMI 36.58 kg/m   Physical Exam Vitals and nursing note reviewed.  Constitutional:      General: She is not in acute distress.    Appearance: Normal appearance. She is well-developed. She is obese.  HENT:     Head: Normocephalic and atraumatic.     Mouth/Throat:     Mouth: Mucous membranes are moist.  Eyes:     Pupils: Pupils are equal, round, and reactive to light.  Cardiovascular:     Rate and Rhythm: Normal rate and regular rhythm.     Heart sounds: Normal heart sounds. No murmur. No friction rub.  Pulmonary:     Effort: Pulmonary effort is normal.     Breath sounds: Decreased breath sounds present. No wheezing or rales.  Abdominal:     General: Bowel sounds are normal. There is no distension.     Palpations: Abdomen is soft.     Tenderness: There is no abdominal tenderness. There  is no guarding or rebound.  Musculoskeletal:        General: No tenderness. Normal range of motion.     Cervical back: Normal range of motion and neck supple. No tenderness.     Right lower leg: Edema present.     Left lower leg: Edema present.     Comments: Trace pitting edema bilateral ankles  Skin:    General: Skin is warm and dry.     Findings: No rash.  Neurological:     General: No focal deficit present.     Mental Status: She is alert and oriented to person, place, and time. Mental status is at baseline.     Cranial Nerves: No cranial nerve deficit.  Psychiatric:        Mood and Affect: Mood normal.        Behavior: Behavior normal.        Thought Content: Thought content normal.     ED Results / Procedures / Treatments   Labs (all labs ordered are listed, but only abnormal results are displayed) Labs Reviewed  CBC WITH DIFFERENTIAL/PLATELET  COMPREHENSIVE METABOLIC PANEL  BRAIN NATRIURETIC PEPTIDE  D-DIMER, QUANTITATIVE (NOT AT [facility])  TROPONIN I (HIGH SENSITIVITY)    EKG EKG Interpretation  Date/Time:  Sunday October 26 2019 14:06:52 EST Ventricular Rate:  75 PR Interval:    QRS Duration: 98 QT Interval:  389 QTC Calculation: 435 R Axis:   -33 Text Interpretation: Sinus rhythm Left axis deviation No significant change since last tracing Confirmed by Blanchie Dessert [phone]) on 10/26/2019 2:56:48 PM   Radiology CT Head Wo Contrast  Result Date: 10/26/2019 CLINICAL DATA:  Patient hit back of head, now with persistent headache. EXAM: CT HEAD WITHOUT CONTRAST TECHNIQUE: Contiguous axial images were obtained from the base of the skull through the vertex without intravenous contrast. COMPARISON:  Brain MRI-09/25/2019 FINDINGS:  Brain: Gray-white differentiation is maintained. No CT evidence of acute large territory infarct. No intraparenchymal or extra-axial mass or hemorrhage. Normal size and configuration of the ventricles and the basilar cisterns. No midline shift.  Vascular: No hyperdense vessel or unexpected calcification. Skull: No displaced calvarial fracture. Sinuses/Orbits: Limited visualization of the paranasal sinuses and mastoid air cells is normal. No air-fluid levels. Other: Regional soft tissues appear normal with special attention paid to the occiput and posterior parietal calvarium. IMPRESSION: Negative noncontrast head CT. Electronically Signed   By: Sandi Mariscal M.D.   On: 10/26/2019 14:39    Procedures Procedures (including critical care time)  Medications Ordered in ED Medications  albuterol (VENTOLIN HFA) 108 (90 Base) MCG/ACT inhaler 8 puff (has no administration in time range)    ED Course  I have reviewed the triage vital signs and the nursing notes.  Pertinent labs & imaging results that were available during my care of the patient were reviewed by me and considered in my medical decision making (see chart for details).    MDM Rules/Calculators/A&P                     44 year old female with multiple medical problems presenting today with atypical chest pain and shortness of breath.  Patient is an ongoing smoker as well as having a history of asthma.  Patient's EKG is within normal limits.  No significant evidence of fluid overload today.  Patient does have decreased breath sounds throughout but satting 96% on room air.  No prior history of PE and low suspicion today.  Patient is not tachycardic.  Patient's heart score is 3 with lower suspicion for ACS at this time given patient's history.  Will rule out PE, CHF.  Patient is not describing symptoms consistent with Covid or pneumonia.  Could be asthma exacerbation as well with the change in temperatures and known asthma and tobacco use history.  Patient given a dose of albuterol. EKG is normal today, chest x-ray is pending.  Patient was complaining of episodes of staring off and not being her normal self since hitting her head 2 weeks ago when she fell in her kitchen.  Imaging is  negative for acute bleed but she may have a concussion.  Final Clinical Impression(s) / ED Diagnoses Final diagnoses:  None    Rx / DC Orders ED Discharge Orders    None       Blanchie Dessert, MD 10/26/19 1525

## 2019-10-27 ENCOUNTER — Other Ambulatory Visit: Payer: Self-pay | Admitting: Family Medicine

## 2019-10-27 ENCOUNTER — Other Ambulatory Visit (INDEPENDENT_AMBULATORY_CARE_PROVIDER_SITE_OTHER): Payer: Self-pay

## 2019-10-27 DIAGNOSIS — Z0289 Encounter for other administrative examinations: Secondary | ICD-10-CM

## 2019-10-27 DIAGNOSIS — Z820 Family history of epilepsy and other diseases of the nervous system: Secondary | ICD-10-CM

## 2019-10-27 DIAGNOSIS — G4710 Hypersomnia, unspecified: Secondary | ICD-10-CM

## 2019-10-27 LAB — SARS CORONAVIRUS 2 (TAT 6-24 HRS): SARS Coronavirus 2: NEGATIVE

## 2019-10-27 MED ORDER — ALBUTEROL SULFATE HFA 108 (90 BASE) MCG/ACT IN AERS: 2.0000 | INHALATION_SPRAY | RESPIRATORY_TRACT | 2 refills | Status: DC | PRN

## 2019-10-27 NOTE — Telephone Encounter (Signed)
Refill sent to pharmacy. Thanks.

## 2019-10-28 ENCOUNTER — Encounter: Payer: Self-pay | Admitting: Family Medicine

## 2019-10-30 ENCOUNTER — Telehealth: Payer: Self-pay

## 2019-10-30 NOTE — Telephone Encounter (Addendum)
-----   Message from Francia Greaves sent at 10/30/2019  1:49 PM EST ----- Regarding: Needs Hysterectomy Statement Surgery 04/06, needs hysterectomy statement  Called pt. Explained to pt that she needs to come into our front office to sign a hysterectomy statement prior to surgery. Office address and hours reviewed with pt.

## 2019-11-04 ENCOUNTER — Encounter: Payer: Self-pay | Admitting: Neurology

## 2019-11-04 LAB — NARCOLEPSY EVALUATION
DQA1*01:02: NEGATIVE
DQB1*06:02: NEGATIVE

## 2019-11-04 NOTE — Progress Notes (Signed)
Negative HLA narcolepsy test in 2 alleles. No marker for narcolepsy detected.

## 2019-11-05 ENCOUNTER — Encounter: Payer: Self-pay | Admitting: *Deleted

## 2019-11-09 ENCOUNTER — Other Ambulatory Visit: Payer: Self-pay | Admitting: Family Medicine

## 2019-11-11 ENCOUNTER — Encounter: Payer: Self-pay | Admitting: Family Medicine

## 2019-11-11 ENCOUNTER — Other Ambulatory Visit: Payer: Self-pay

## 2019-11-11 ENCOUNTER — Ambulatory Visit (INDEPENDENT_AMBULATORY_CARE_PROVIDER_SITE_OTHER): Payer: Medicaid Other | Admitting: Family Medicine

## 2019-11-11 VITALS — BP 109/81 | HR 98 | Temp 98.6°F | Resp 16 | Ht 62.0 in | Wt 204.0 lb

## 2019-11-11 DIAGNOSIS — Z8669 Personal history of other diseases of the nervous system and sense organs: Secondary | ICD-10-CM

## 2019-11-11 DIAGNOSIS — J4520 Mild intermittent asthma, uncomplicated: Secondary | ICD-10-CM

## 2019-11-11 DIAGNOSIS — G894 Chronic pain syndrome: Secondary | ICD-10-CM

## 2019-11-11 DIAGNOSIS — R7303 Prediabetes: Secondary | ICD-10-CM | POA: Diagnosis not present

## 2019-11-11 DIAGNOSIS — I10 Essential (primary) hypertension: Secondary | ICD-10-CM

## 2019-11-11 LAB — POCT GLYCOSYLATED HEMOGLOBIN (HGB A1C): Hemoglobin A1C: 6.4 % — AB (ref 4.0–5.6)

## 2019-11-11 NOTE — Patient Instructions (Addendum)
COVID-19 Vaccine Information can be found at: [email or web address] For questions related to vaccine distribution or appointments, please email [email or web address] or call [phone].     Prediabetes Eating Plan Prediabetes is a condition that causes blood sugar (glucose) levels to be higher than normal. This increases the risk for developing diabetes. In order to prevent diabetes from developing, your health care provider may recommend a diet and other lifestyle changes to help you:  Control your blood glucose levels.  Improve your cholesterol levels.  Manage your blood pressure. Your health care provider may recommend working with a diet and nutrition specialist (dietitian) to make a meal plan that is best for you. What are tips for following this plan? Lifestyle  Set weight loss goals with the help of your health care team. It is recommended that most people with prediabetes lose 7% of their current body weight.  Exercise for at least 30 minutes at least 5 days a week.  Attend a support group or seek ongoing support from a mental health counselor.  Take over-the-counter and prescription medicines only as told by your health care provider. Reading food labels  Read food labels to check the amount of fat, salt (sodium), and sugar in prepackaged foods. Avoid foods that have: ? Saturated fats. ? Trans fats. ? Added sugars.  Avoid foods that have more than 300 milligrams (mg) of sodium per serving. Limit your daily sodium intake to less than 2,300 mg each day. Shopping  Avoid buying pre-made and processed foods. Cooking  Cook with olive oil. Do not use butter, lard, or ghee.  Bake, broil, grill, or boil foods. Avoid frying. Meal planning   Work with your dietitian to develop an eating plan that is right for you. This may include: ? Tracking how many calories you take in. Use a food diary, notebook, or mobile  application to track what you eat at each meal. ? Using the glycemic index (GI) to plan your meals. The index tells you how quickly a food will raise your blood glucose. Choose low-GI foods. These foods take a longer time to raise blood glucose.  Consider following a Mediterranean diet. This diet includes: ? Several servings each day of fresh fruits and vegetables. ? Eating fish at least twice a week. ? Several servings each day of whole grains, beans, nuts, and seeds. ? Using olive oil instead of other fats. ? Moderate alcohol consumption. ? Eating small amounts of red meat and whole-fat dairy.  If you have high blood pressure, you may need to limit your sodium intake or follow a diet such as the DASH eating plan. DASH is an eating plan that aims to lower high blood pressure. What foods are recommended? The items listed below may not be a complete list. Talk with your dietitian about what dietary choices are best for you. Grains Whole grains, such as whole-wheat or whole-grain breads, crackers, cereals, and pasta. Unsweetened oatmeal. Bulgur. Barley. Quinoa. Klutts rice. Corn or whole-wheat flour tortillas or taco shells. Vegetables Lettuce. Spinach. Peas. Beets. Cauliflower. Cabbage. Broccoli. Carrots. Tomatoes. Squash. Eggplant. Herbs. Peppers. Onions. Cucumbers. Brussels sprouts. Fruits Berries. Bananas. Apples. Oranges. Grapes. Papaya. Mango. Pomegranate. Kiwi. Grapefruit. Cherries. Meats and other protein foods Seafood. Poultry without skin. Lean cuts of pork and beef. Tofu. Eggs. Nuts. Beans. Dairy Low-fat or fat-free dairy products, such as yogurt, cottage cheese, and cheese. Beverages Water. Tea. Coffee. Sugar-free or diet soda. Seltzer water. Lowfat or no-fat milk. Milk alternatives, such as soy or almond milk. Fats and oils  Olive oil. Canola oil. Sunflower oil. Grapeseed oil. Avocado. Walnuts. Sweets and desserts Sugar-free or low-fat pudding. Sugar-free or low-fat ice cream  and other frozen treats. Seasoning and other foods Herbs. Sodium-free spices. Mustard. Relish. Low-fat, low-sugar ketchup. Low-fat, low-sugar barbecue sauce. Low-fat or fat-free mayonnaise. What foods are not recommended? The items listed below may not be a complete list. Talk with your dietitian about what dietary choices are best for you. Grains Refined white flour and flour products, such as bread, pasta, snack foods, and cereals. Vegetables Canned vegetables. Frozen vegetables with butter or cream sauce. Fruits Fruits canned with syrup. Meats and other protein foods Fatty cuts of meat. Poultry with skin. Breaded or fried meat. Processed meats. Dairy Full-fat yogurt, cheese, or milk. Beverages Sweetened drinks, such as sweet iced tea and soda. Fats and oils Butter. Lard. Ghee. Sweets and desserts Baked goods, such as cake, cupcakes, pastries, cookies, and cheesecake. Seasoning and other foods Spice mixes with added salt. Ketchup. Barbecue sauce. Mayonnaise. Summary  To prevent diabetes from developing, you may need to make diet and other lifestyle changes to help control blood sugar, improve cholesterol levels, and manage your blood pressure.  Set weight loss goals with the help of your health care team. It is recommended that most people with prediabetes lose 7 percent of their current body weight.  Consider following a Mediterranean diet that includes plenty of fresh fruits and vegetables, whole grains, beans, nuts, seeds, fish, lean meat, low-fat dairy, and healthy oils. This information is not intended to replace advice given to you by your health care provider. Make sure you discuss any questions you have with your health care provider. Document Revised: 11/29/2018 Document Reviewed: 10/11/2016 Elsevier Patient Education  2020 [employer].

## 2019-11-11 NOTE — Progress Notes (Signed)
Patient Leah Barber    Subjective:  Patient ID: Leah Barber, female    DOB: 08/17/1976  Age: 44 y.o. MRN: [id number]  CC:  Chief Complaint  Patient presents with  . Medication Refill    states she needs all refills   . Follow-up    1 month follow up for chronic pain?  . Asthma    asking for a nebulizer machine     HPI Leah Barber is a 44 year old female with a medical history significant for idiopathic intracranial hypertension, prediabetes, mild intermittent asthma, generalized anxiety, bipolar depression, hyperlipidemia, and chronic pain syndrome presents for follow-up of chronic conditions.  Patient states that she has been doing very well and has minimal complaints.  She is followed by a number of specialist for chronic conditions.  She states that she has been following up as scheduled.  Patient has a history of prediabetes.  She has not been following a low-fat, low carbohydrate diet or exercising consistently.  She does not check blood sugars at home. Patient denies polyuria, polydipsia, or polyphagia. Ms. Chaudhari has a history of hypertension.  She does not check blood pressure at home.  She has been taking medication consistently.  She denies lower extremity swelling, shortness of breath, chest pain, or heart palpitations. Patient has a history of bipolar depression and generalized anxiety.  She is followed by psychiatry biweekly.  She was recently started on Concerta.  Patient states that her attention deficit has improved.  Past Medical History:  Diagnosis Date  . Anxiety   . Asthma   . Chronic lower back pain   . Depression   . Diabetes mellitus without complication ([place])   . Hyperlipemia   . Hypertension   . IIH (idiopathic intracranial hypertension)   . Pseudotumor cerebri 08/13/2019    Past Surgical History:  Procedure Laterality Date  . CESAREAN SECTION     x 3  . fluid removed from brain  12/20/2018    Family  History  Problem Relation Age of Onset  . Diabetes Mother   . Hypertension Mother   . Cancer Father     Social History   Socioeconomic History  . Marital status: Legally Separated    Spouse name: Not on file  . Number of children: Not on file  . Years of education: Not on file  . Highest education level: Not on file  Occupational History  . Not on file  Tobacco Use  . Smoking status: Current Every Day Smoker    Packs/day: 1.00    Types: Cigarettes  . Smokeless tobacco: Never Used  Substance and Sexual Activity  . Alcohol use: No  . Drug use: No  . Sexual activity: Not Currently  Other Topics Concern  . Not on file  Social History Narrative  . Not on file   Social Determinants of Health   Financial Resource Strain:   . Difficulty of Paying Living Expenses:   Food Insecurity:   . Worried About Charity fundraiser in the Last Year:   . Arboriculturist in the Last Year:   Transportation Needs:   . Film/video editor (Medical):   Marland Kitchen Lack of Transportation (Non-Medical):   Physical Activity:   . Days of Exercise per Week:   . Minutes of Exercise per Session:   Stress:   . Feeling of Stress :   Social Connections:   . Frequency of Communication with Friends and Family:   .  Frequency of Social Gatherings with Friends and Family:   . Attends Religious Services:   . Active Member of Clubs or Organizations:   . Attends Archivist Meetings:   Marland Kitchen Marital Status:   Intimate Partner Violence:   . Fear of Current or Ex-Partner:   . Emotionally Abused:   Marland Kitchen Physically Abused:   . Sexually Abused:     Outpatient Medications Prior to Visit  Medication Sig Dispense Refill  . albuterol (VENTOLIN HFA) 108 (90 Base) MCG/ACT inhaler Inhale 2 puffs into the lungs every 4 (four) hours as needed for wheezing or shortness of breath. 6.7 g 2  . amLODipine (NORVASC) 10 MG tablet Take 1 tablet (10 mg total) by mouth daily. 90 tablet 1  . baclofen (LIORESAL) 10 MG tablet  Take 1 tablet (10 mg total) by mouth 3 (three) times daily. (Patient taking differently: Take 10 mg by mouth 3 (three) times daily as needed for muscle spasms. ) 90 each 1  . Buprenorphine HCl-Naloxone HCl 8-2 MG FILM Place 1 Film under the tongue 2 (two) times daily.     . cetirizine (ZYRTEC) 10 MG tablet Take 1 tablet (10 mg total) by mouth daily. 30 tablet 11  . cloNIDine (CATAPRES) 0.1 MG tablet TAKE 1 TABLET BY MOUTH THREE TIMES A DAY 60 tablet 1  . CONCERTA 36 MG CR tablet Take 36 mg by mouth every morning.    . DULoxetine (CYMBALTA) 60 MG capsule TAKE 1 CAPSULE (60 MG TOTAL) BY MOUTH 2 (TWO) TIMES DAILY. 180 capsule 2  . FLUoxetine (PROZAC) 20 MG capsule Take 1 capsule (20 mg total) by mouth daily. (Patient taking differently: Take 40 mg by mouth daily. ) 30 capsule 3  . fluticasone (FLONASE) 50 MCG/ACT nasal spray Place 2 sprays into both nostrils daily. 9.9 g 2  . furosemide (LASIX) 20 MG tablet TAKE 1 TABLET BY MOUTH EVERY DAY 90 tablet 1  . gabapentin (NEURONTIN) 400 MG capsule Take 2 capsules (800 mg total) by mouth 3 (three) times daily. 90 capsule 5  . ibuprofen (ADVIL) 600 MG tablet Take 1 tablet (600 mg total) by mouth every 8 (eight) hours as needed. (Patient taking differently: Take 600 mg by mouth every 8 (eight) hours as needed for moderate pain. ) 90 tablet 3  . latanoprost (XALATAN) 0.005 % ophthalmic solution Place 1 drop into both eyes at bedtime.     . metFORMIN (GLUCOPHAGE) 500 MG tablet Take 1 tablet (500 mg total) by mouth 2 (two) times daily with a meal. 180 tablet 1  . Multiple Vitamins-Minerals (WOMENS MULTIVITAMIN PO) Take 1 tablet by mouth daily.     . norethindrone (AYGESTIN) 5 MG tablet Take 15 mg by mouth daily.    Marland Kitchen omeprazole (PRILOSEC) 40 MG capsule Take 1 capsule (40 mg total) by mouth daily. 90 capsule 1  . ondansetron (ZOFRAN) 4 MG tablet Take 1 tablet (4 mg total) by mouth every 8 (eight) hours as needed for nausea or vomiting. 30 tablet 0  .  Spacer/Aero-Holding Chambers (AEROCHAMBER PLUS WITH MASK) inhaler Use as instructed 1 each 2  . REXULTI 1 MG TABS tablet Take 1 mg by mouth at bedtime.    . miconazole (MICOTIN) 2 % powder Apply topically as needed for itching. (Patient not taking: Reported on 09/23/2019) 70 g 0  . norethindrone (AYGESTIN) 5 MG tablet Take 3 tablets (15 mg total) by mouth daily. 90 tablet 3  . acetaZOLAMIDE (DIAMOX) 250 MG tablet Take 1 tablet (250  mg total) by mouth 3 (three) times daily. (Patient not taking: Reported on 10/26/2019) 90 tablet 2   No facility-administered medications prior to visit.    Allergies  Allergen Reactions  . Clindamycin/Lincomycin     Nausea   . Hydrocodone Nausea And Vomiting    "stomach pain" "stomach pain"  . Morphine Other (See Comments) and Nausea Only    ROS Review of Systems  HENT: Negative.   Eyes: Negative.   Respiratory: Negative.   Cardiovascular: Negative.  Negative for chest pain and leg swelling.  Gastrointestinal: Negative.   Endocrine: Negative for polydipsia, polyphagia and polyuria.  Genitourinary: Negative.   Musculoskeletal: Negative.  Negative for arthralgias, back pain and myalgias.  Skin: Negative.   Neurological: Negative.  Negative for dizziness and facial asymmetry.  Psychiatric/Behavioral: Negative.       Objective:    Physical Exam  Constitutional: She is oriented to person, place, and time.  HENT:  Head: Normocephalic and atraumatic.  Eyes: Pupils are equal, round, and reactive to light.  Cardiovascular: Normal rate and regular rhythm.  Pulmonary/Chest: Effort normal and breath sounds normal.  Abdominal: Soft. Bowel sounds are normal.  Musculoskeletal:        General: Normal range of motion.  Neurological: She is alert and oriented to person, place, and time.  Skin: Skin is warm.  Psychiatric: She has a normal mood and affect. Her behavior is normal. Judgment and thought content normal.    BP 109/81 (BP Location: Left Arm, Patient  Position: Sitting, Cuff Size: Normal)   Pulse 98   Temp 98.6 F (37 C) (Oral)   Resp 16   Ht 5\' 2"  (1.575 m)   Wt 204 lb (92.5 kg)   SpO2 98%   BMI 37.31 kg/m  Wt Readings from Last 3 Encounters:  11/11/19 204 lb (92.5 kg)  10/26/19 200 lb (90.7 kg)  10/15/19 200 lb (90.7 kg)     There are no preventive Barber reminders to display for this patient.  There are no preventive Barber reminders to display for this patient.  Lab Results  Component Value Date   TSH 1.10 11/23/2016   Lab Results  Component Value Date   WBC 9.1 10/26/2019   HGB 14.0 10/26/2019   HCT 43.9 10/26/2019   MCV 97.6 10/26/2019   PLT 267 10/26/2019   Lab Results  Component Value Date   NA 141 10/26/2019   K 3.3 (L) 10/26/2019   CO2 27 10/26/2019   GLUCOSE 114 (H) 10/26/2019   BUN 10 10/26/2019   CREATININE 0.52 10/26/2019   BILITOT 0.5 10/26/2019   ALKPHOS 78 10/26/2019   AST 26 10/26/2019   ALT 29 10/26/2019   PROT 7.4 10/26/2019   ALBUMIN 3.7 10/26/2019   CALCIUM 8.8 (L) 10/26/2019   ANIONGAP 6 10/26/2019   Lab Results  Component Value Date   CHOL 216 (H) 11/23/2016   Lab Results  Component Value Date   HDL 47 (L) 11/23/2016   Lab Results  Component Value Date   LDLCALC 151 (H) 11/23/2016   Lab Results  Component Value Date   TRIG 92 11/23/2016   Lab Results  Component Value Date   CHOLHDL 4.6 11/23/2016   Lab Results  Component Value Date   HGBA1C 6.1 (A) 09/09/2019      Assessment & Plan:   Problem List Items Addressed This Visit    None    Visit Diagnoses    Prediabetes    -  Primary   Relevant  Orders   HgB A1c   Essential hypertension         Prediabetes Hemoglobin A1c has increased to 6.4 from 6.1 several months prior.  Patient states that she has not been following a carbohydrate modified diet since isolating for Covid.  She finds himself eating more and is mostly sedentary.  Patient advised to increase daily physical activity in order to achieve positive  outcomes.  We will continue Metformin at current dose, no increase warranted at this time. - HgB A1c  Essential hypertension BP 109/81 (BP Location: Left Arm, Patient Position: Sitting, Cuff Size: Normal)   Pulse 98   Temp 98.6 F (37 C) (Oral)   Resp 16   Ht 5\' 2"  (1.575 m)   Wt 204 lb (92.5 kg)   SpO2 98%   BMI 37.31 kg/m  Blood pressure controlled on current medication regimen.  No changes warranted.  Advised to continue low-sodium diet.  Also, discussed increasing daily physical activity at length.  Chronic pain syndrome Patient has a history of polysubstance abuse and opiate dependence.  Discussed that acetaminophen will be her best option at this time.  Avoid NSAIDs.  Pain management may be warranted.  Discussed that opiates will not be prescribed by this clinic, patient expressed understanding.  She is agreeable to plan of Barber  History of idiopathic intracranial hypertension Patient followed closely by neurology.  Will defer to neurology's Barber plan  History of glaucoma Patient is followed closely by ophthalmology and is seen every 6 months.  Mild intermittent asthma, unspecified whether complicated Asthma has been well controlled, no recent exacerbations.  Follow-up: Return in about 1 month (around 12/12/2019).    [name]  APRN, MSN, FNP-C Patient [facility] [address] [place], [place] [address] [phone]

## 2019-11-12 ENCOUNTER — Telehealth: Payer: Self-pay | Admitting: Family Medicine

## 2019-11-13 ENCOUNTER — Other Ambulatory Visit: Payer: Self-pay | Admitting: Family Medicine

## 2019-11-13 ENCOUNTER — Other Ambulatory Visit: Payer: Medicaid Other

## 2019-11-13 DIAGNOSIS — R110 Nausea: Secondary | ICD-10-CM

## 2019-11-13 DIAGNOSIS — I10 Essential (primary) hypertension: Secondary | ICD-10-CM

## 2019-11-13 MED ORDER — METFORMIN HCL 500 MG PO TABS: 500.0000 mg | ORAL_TABLET | Freq: Two times a day (BID) | ORAL | 1 refills | Status: DC

## 2019-11-13 MED ORDER — LATANOPROST 0.005 % OP SOLN: 1.0000 [drp] | Freq: Every day | OPHTHALMIC | 0 refills | Status: DC

## 2019-11-13 MED ORDER — ONDANSETRON HCL 4 MG PO TABS: 4.0000 mg | ORAL_TABLET | Freq: Three times a day (TID) | ORAL | 0 refills | Status: DC | PRN

## 2019-11-13 MED ORDER — AMLODIPINE BESYLATE 10 MG PO TABS: 10.0000 mg | ORAL_TABLET | Freq: Every day | ORAL | 1 refills | Status: DC

## 2019-11-13 MED ORDER — GABAPENTIN 400 MG PO CAPS: 800.0000 mg | ORAL_CAPSULE | Freq: Three times a day (TID) | ORAL | 5 refills | Status: DC

## 2019-11-13 MED ORDER — FUROSEMIDE 20 MG PO TABS: 20.0000 mg | ORAL_TABLET | Freq: Every day | ORAL | 1 refills | Status: DC

## 2019-11-13 NOTE — Progress Notes (Signed)
Meds ordered this encounter  Medications  . amLODipine (NORVASC) 10 MG tablet    Sig: Take 1 tablet (10 mg total) by mouth daily.    Dispense:  90 tablet    Refill:  1    Order Specific Question:   Supervising Provider    Answer:   Tresa Garter [id number]  . furosemide (LASIX) 20 MG tablet    Sig: Take 1 tablet (20 mg total) by mouth daily.    Dispense:  90 tablet    Refill:  1    Order Specific Question:   Supervising Provider    Answer:   Tresa Garter [id number]  . latanoprost (XALATAN) 0.005 % ophthalmic solution    Sig: Place 1 drop into both eyes at bedtime.    Dispense:  2.5 mL    Refill:  0    Order Specific Question:   Supervising Provider    Answer:   Tresa Garter [id number]  . metFORMIN (GLUCOPHAGE) 500 MG tablet    Sig: Take 1 tablet (500 mg total) by mouth 2 (two) times daily with a meal.    Dispense:  180 tablet    Refill:  1    Order Specific Question:   Supervising Provider    Answer:   Tresa Garter [id number]  . ondansetron (ZOFRAN) 4 MG tablet    Sig: Take 1 tablet (4 mg total) by mouth every 8 (eight) hours as needed for nausea or vomiting.    Dispense:  30 tablet    Refill:  0    Order Specific Question:   Supervising Provider    Answer:   Tresa Garter [id number]  . gabapentin (NEURONTIN) 400 MG capsule    Sig: Take 2 capsules (800 mg total) by mouth 3 (three) times daily.    Dispense:  90 capsule    Refill:  5    Order Specific Question:   Supervising Provider    Answer:   Tresa Garter [id number]    [name]  APRN, MSN, FNP-C Patient [facility] [address] [place], [place] [address] [phone]

## 2019-11-13 NOTE — Telephone Encounter (Signed)
Done

## 2019-11-13 NOTE — Patient Instructions (Addendum)
Get your Covid test at [facility]. In [place]. On 11/21/19 at 2:45 pm   Leah Barber       Your procedure is scheduled on 11/25/19   Report to [facility]  at 5:30  A.M.   Call this number if you have problems the morning of [phone]   OUR ADDRESS IS [place], WE ARE LOCATED IN THE [facility] WITH [facility].   Remember:  Do not eat food or drink liquids after midnight.   Take these medicines the morning of surgery with A SIP OF WATER: Prozac, Cymbalta, Gabapentin, Catapres, Amlodipine,Zyrtec,Flonase, use your inhalers  and eye drops.  How to Manage Your Diabetes Before and After Surgery  Why is it important to control my blood sugar before and after surgery? . Improving blood sugar levels before and after surgery helps healing and can limit problems. . A way of improving blood sugar control is eating a healthy diet by: o  Eating less sugar and carbohydrates o  Increasing activity/exercise o  Talking with your doctor about reaching your blood sugar goals . High blood sugars (greater than 180 mg/dL) can raise your risk of infections and slow your recovery, so you will need to focus on controlling your diabetes during the weeks before surgery. . Make sure that the doctor who takes care of your diabetes knows about your planned surgery including the date and location.  How do I manage my blood sugar before surgery? . Check your blood sugar at least 4 times a day, starting 2 days before surgery, to make sure that the level is not too high or low. o Check your blood sugar the morning of your surgery when you wake up and every 2 hours until you get to the Short Stay unit. . If your blood sugar is less than 70 mg/dL, you will need to treat for low blood sugar: o Do not take insulin. o Treat a low blood sugar (less than 70 mg/dL) with  cup of clear juice (cranberry or apple), 4 glucose tablets, OR glucose gel. o Recheck blood  sugar in 15 minutes after treatment (to make sure it is greater than 70 mg/dL). If your blood sugar is not greater than 70 mg/dL on recheck, call [phone] for further instructions. . Report your blood sugar to the short stay nurse when you get to Short Stay.  . If you are admitted to the hospital after surgery: o Your blood sugar will be checked by the staff and you will probably be given insulin after surgery (instead of oral diabetes medicines) to make sure you have good blood sugar levels. o The goal for blood sugar control after surgery is 80-180 mg/dL.   WHAT DO I DO ABOUT MY DIABETES MEDICATION?  Marland Kitchen Do not take oral diabetes medicines (pills) the morning of surgery.     Do not wear jewelry, make-up or nail polish.  Do not wear lotions, powders, or perfumes, or deoderant.  Do not shave 48 hours prior to surgery.   Do not bring valuables to the hospital.  [facility] is not responsible for any belongings or valuables.  Contacts, dentures or bridgework may not be worn into surgery.  Leave your suitcase in the car.  After surgery it may be brought to your room.  For patients admitted to the hospital, discharge time will be determined by your treatment team.  Patients discharged the day of surgery will not be allowed to drive  home.   Special instructions:  Bring all meds to the hospital in their original bottles.  Please read over the following fact sheets that you were given:       [facility] - Preparing for Surgery Before surgery, you can play an important role.   Because skin is not sterile, your skin needs to be as free of germs as possible.   You can reduce the number of germs on your skin by washing with CHG (chlorahexidine gluconate) soap before surgery.   CHG is an antiseptic cleaner which kills germs and bonds with the skin to continue killing germs even after washing. Please DO NOT use if you have an allergy to CHG or antibacterial soaps.   If your skin becomes  reddened/irritated stop using the CHG and inform your nurse when you arrive at Short Stay. Do not shave (including legs and underarms) for at least 48 hours prior to the first CHG shower.    Please follow these instructions carefully:  1.  Shower with CHG Soap the night before surgery and the  morning of Surgery.  2.  If you choose to wash your hair, wash your hair first as usual with your  normal  shampoo.  3.  After you shampoo, rinse your hair and body thoroughly to remove the  shampoo.                                        4.  Use CHG as you would any other liquid soap.  You can apply chg directly  to the skin and wash                       Gently with a scrungie or clean washcloth.  5.  Apply the CHG Soap to your body ONLY FROM THE NECK DOWN.   Do not use on face/ open                           Wound or open sores. Avoid contact with eyes, ears mouth and genitals (private parts).                       Wash face,  Genitals (private parts) with your normal soap.             6.  Wash thoroughly, paying special attention to the area where your surgery  will be performed.  7.  Thoroughly rinse your body with warm water from the neck down.  8.  DO NOT shower/wash with your normal soap after using and rinsing off  the CHG Soap.             9.  Pat yourself dry with a clean towel.            10.  Wear clean pajamas.            11.  Place clean sheets on your bed the night of your first shower and do not  sleep with pets. Day of Surgery : Do not apply any lotions/deodorants the morning of surgery.  Please wear clean clothes to the hospital/surgery center.  FAILURE TO FOLLOW THESE INSTRUCTIONS MAY RESULT IN THE CANCELLATION OF YOUR SURGERY PATIENT SIGNATURE_________________________________  NURSE SIGNATURE__________________________________  ________________________________________________________________________

## 2019-11-13 NOTE — Progress Notes (Signed)
CAN YOU PLEASE PLACE SOME ORDER FOR THE UPCOMING SURGERY.PT PST APPOINTMENT ON 11/14/19.THANK YOU.

## 2019-11-14 ENCOUNTER — Other Ambulatory Visit: Payer: Self-pay | Admitting: Family Medicine

## 2019-11-14 ENCOUNTER — Encounter (HOSPITAL_COMMUNITY)
Admission: RE | Admit: 2019-11-14 | Discharge: 2019-11-14 | Disposition: A | Discharge status: Home / Self Care | Payer: Medicaid Other | Source: Ambulatory Visit | Attending: Obstetrics & Gynecology | Admitting: Obstetrics & Gynecology

## 2019-11-14 ENCOUNTER — Encounter (HOSPITAL_COMMUNITY): Payer: Self-pay

## 2019-11-14 ENCOUNTER — Other Ambulatory Visit (INDEPENDENT_AMBULATORY_CARE_PROVIDER_SITE_OTHER): Payer: Medicaid Other

## 2019-11-14 ENCOUNTER — Other Ambulatory Visit: Payer: Self-pay

## 2019-11-14 DIAGNOSIS — Z01818 Encounter for other preprocedural examination: Secondary | ICD-10-CM | POA: Diagnosis present

## 2019-11-14 DIAGNOSIS — B9689 Other specified bacterial agents as the cause of diseases classified elsewhere: Secondary | ICD-10-CM

## 2019-11-14 DIAGNOSIS — N760 Acute vaginitis: Secondary | ICD-10-CM

## 2019-11-14 DIAGNOSIS — R300 Dysuria: Secondary | ICD-10-CM | POA: Diagnosis not present

## 2019-11-14 HISTORY — DX: Anemia, unspecified: D64.9

## 2019-11-14 HISTORY — DX: Dyspnea, unspecified: R06.00

## 2019-11-14 LAB — BASIC METABOLIC PANEL
Anion gap: 9 (ref 5–15)
BUN: 9 mg/dL (ref 6–20)
CO2: 24 mmol/L (ref 22–32)
Calcium: 8.8 mg/dL — ABNORMAL LOW (ref 8.9–10.3)
Chloride: 107 mmol/L (ref 98–111)
Creatinine, Ser: 0.61 mg/dL (ref 0.44–1.00)
GFR calc Af Amer: >60 mL/min (ref >60)
GFR calc non Af Amer: >60 mL/min (ref >60)
Glucose, Bld: 138 mg/dL — ABNORMAL HIGH (ref 70–99)
Potassium: 3.9 mmol/L (ref 3.5–5.1)
Sodium: 140 mmol/L (ref 135–145)

## 2019-11-14 LAB — POCT URINALYSIS DIPSTICK
Bilirubin, UA: NEGATIVE
Glucose, UA: NEGATIVE
Ketones, UA: NEGATIVE
Leukocytes, UA: NEGATIVE
Nitrite, UA: NEGATIVE
Protein, UA: NEGATIVE
Spec Grav, UA: 1.015 (ref 1.010–1.025)
Urobilinogen, UA: 0.2 E.U./dL
pH, UA: 5.5 (ref 5.0–8.0)

## 2019-11-14 LAB — CBC
HCT: 42.8 % (ref 36.0–46.0)
Hemoglobin: 13.4 g/dL (ref 12.0–15.0)
MCH: 30.8 pg (ref 26.0–34.0)
MCHC: 31.3 g/dL (ref 30.0–36.0)
MCV: 98.4 fL (ref 80.0–100.0)
Platelets: 258 10*3/uL (ref 150–400)
RBC: 4.35 MIL/uL (ref 3.87–5.11)
RDW: 11.9 % (ref 11.5–15.5)
WBC: 8.4 10*3/uL (ref 4.0–10.5)
nRBC: 0 % (ref 0.0–0.2)

## 2019-11-14 LAB — ABO/RH: ABO/RH(D): B POS

## 2019-11-14 LAB — GLUCOSE, CAPILLARY: Glucose-Capillary: 134 mg/dL — ABNORMAL HIGH (ref 70–99)

## 2019-11-14 MED ORDER — METRONIDAZOLE 0.75 % VA GEL: 1.0000 | Freq: Every day | VAGINAL | 0 refills | Status: AC

## 2019-11-14 NOTE — Progress Notes (Signed)
PT. REFUSES BLOOD AND BLOOD PRODUCTS.

## 2019-11-14 NOTE — Progress Notes (Signed)
PCP - Cammie Sickle FNP. LOV: 11/11/19 Cardiologist -   Chest x-ray - 10/26/19 EKG - 10/26/19. EPIC Stress Test -  ECHO -  Cardiac Cath -  A1C: 11/11/19 Sleep Study -  CPAP -   Fasting Blood Sugar -  Checks Blood Sugar _____ times a day  Blood Thinner Instructions: Aspirin Instructions: Last Dose:  Anesthesia review:   Patient denies shortness of breath, fever, cough and chest pain at PAT appointment   Patient verbalized understanding of instructions that were given to them at the PAT appointment. Patient was also instructed that they will need to review over the PAT instructions again at home before surgery.

## 2019-11-14 NOTE — Progress Notes (Signed)
Meds ordered this encounter  Medications  . metroNIDAZOLE (METROGEL) 0.75 % vaginal gel    Sig: Place 1 Applicatorful vaginally at bedtime for 5 days.    Dispense:  70 g    Refill:  0    Order Specific Question:   Supervising Provider    Answer:   Tresa Garter [id number]    [name]  APRN, MSN, FNP-C Patient [facility] [address] [place], [place] [address] [phone]

## 2019-11-16 LAB — URINE CULTURE

## 2019-11-17 ENCOUNTER — Ambulatory Visit (INDEPENDENT_AMBULATORY_CARE_PROVIDER_SITE_OTHER): Payer: Medicaid Other | Admitting: Neurology

## 2019-11-17 ENCOUNTER — Encounter: Payer: Medicaid Other | Admitting: Obstetrics & Gynecology

## 2019-11-17 ENCOUNTER — Other Ambulatory Visit: Payer: Self-pay

## 2019-11-17 ENCOUNTER — Encounter: Payer: Self-pay | Admitting: Neurology

## 2019-11-17 ENCOUNTER — Ambulatory Visit: Payer: Medicaid Other | Admitting: Neurology

## 2019-11-17 DIAGNOSIS — M79602 Pain in left arm: Secondary | ICD-10-CM

## 2019-11-17 DIAGNOSIS — M48061 Spinal stenosis, lumbar region without neurogenic claudication: Secondary | ICD-10-CM | POA: Diagnosis not present

## 2019-11-17 DIAGNOSIS — G894 Chronic pain syndrome: Secondary | ICD-10-CM

## 2019-11-17 NOTE — Progress Notes (Signed)
Please refer to EMG and nerve conduction procedure note.  

## 2019-11-17 NOTE — Procedures (Signed)
HISTORY:  Leah Barber is a 44 year old patient with a chronic history of low back pain and bilateral lower extremity sciatica.  This has been present for 10 years.  The patient more recently over the last 3 to 4 months has noted discomfort and numbness and tingling into the left arm to the hand.  The patient has been evaluated for this issue.  NERVE CONDUCTION STUDIES:  Nerve conduction studies were performed on the left upper extremity. The distal motor latencies and motor amplitudes for the median and ulnar nerves were within normal limits. The nerve conduction velocities for these nerves were also normal. The sensory latencies for the median, radial, and ulnar nerves were normal. The F wave latency for the ulnar nerve was within normal limits.   Nerve conduction studies were performed on the left lower extremity. The distal motor latencies and motor amplitudes for the peroneal and posterior tibial nerves were within normal limits. The nerve conduction velocities for these nerves were also normal. The sensory latencies for the peroneal and sural nerves were within normal limits. The F wave latency for the posterior tibial nerve was within normal limits.   EMG STUDIES:  EMG study was performed on the left upper extremity:  The first dorsal interosseous muscle reveals 2 to 4 K units with full recruitment. No fibrillations or positive waves were noted. The abductor pollicis brevis muscle reveals 2 to 4 K units with full recruitment. No fibrillations or positive waves were noted. The extensor indicis proprius muscle reveals 1 to 3 K units with full recruitment. No fibrillations or positive waves were noted. The pronator teres muscle reveals 2 to 3 K units with full recruitment. No fibrillations or positive waves were noted. The biceps muscle reveals 1 to 2 K units with full recruitment. No fibrillations or positive waves were noted. The triceps muscle reveals 2 to 4 K units with full  recruitment. No fibrillations or positive waves were noted. The anterior deltoid muscle reveals 2 to 3 K units with full recruitment. No fibrillations or positive waves were noted. The cervical paraspinal muscles were tested at 2 levels. 2+ positive waves were seen at both levels tested. There was good relaxation.  EMG study was performed on the left lower extremity:  The tibialis anterior muscle reveals 2 to 4K motor units with full recruitment. No fibrillations or positive waves were seen. The peroneus tertius muscle reveals 2 to 4K motor units with full recruitment. No fibrillations or positive waves were seen. The medial gastrocnemius muscle reveals 1 to 3K motor units with full recruitment. No fibrillations or positive waves were seen. The vastus lateralis muscle reveals 2 to 4K motor units with full recruitment. No fibrillations or positive waves were seen. The iliopsoas muscle reveals 2 to 4K motor units with full recruitment. No fibrillations or positive waves were seen. The biceps femoris muscle (long head) reveals 2 to 4K motor units with full recruitment. No fibrillations or positive waves were seen. The lumbosacral paraspinal muscles were tested at 3 levels, and revealed no abnormalities of insertional activity at all 3 levels tested. There was good relaxation.   IMPRESSION:  Nerve conduction studies done on the left upper and left lower extremities were within normal limits.  No evidence of a neuropathy was seen.  EMG evaluation of the left upper extremity was unremarkable but there was evidence of acute denervation seen on the cervical paraspinal muscles on 2 levels.  This would suggest a cervical radiculopathy of indeterminate level.  EMG evaluation of the left lower extremity was unremarkable, no evidence of an overlying lumbosacral radiculopathy was seen.  [name] MD 11/17/2019 2:35 PM  [facility] [address] [place], [place]  [address]  Phone [phone] Fax [phone]

## 2019-11-17 NOTE — Progress Notes (Addendum)
The patient comes in for EMG and nerve conduction study evaluation.  She gives a 10-year history of back pain and bilateral sciatica type pain, left greater than right.  The patient also has noted over the last 3 to 4 months that she has had left arm numbness and tingling.  She does have chronic neck pain as well.  Prior to moving to the [place] area, she was apparently referred to the neurosurgeon for her back issue, she never made the appointment because she moved here.  EMG nerve conduction study done today on the left arm and leg were relatively unremarkable, but there was evidence of acute denervation at 2 levels of the paraspinal muscles in the neck.  The patient will be sent for MRI of the cervical spine.    [place]    Nerve / Sites Muscle Latency Ref. Amplitude Ref. Rel Amp Segments Distance Velocity Ref. Area    ms ms mV mV %  cm m/s m/s mVms  L Median - APB     Wrist APB 3.0 ?4.4 9.1 ?4.0 100 Wrist - APB 7   27.7     Upper arm APB 6.7  8.9  97.9 Upper arm - Wrist 22 59 ?49 27.4  L Ulnar - ADM     Wrist ADM 2.2 ?3.3 8.6 ?6.0 100 Wrist - ADM 7   23.6     B.Elbow ADM 5.2  7.3  85.3 B.Elbow - Wrist 19 64 ?49 21.5     A.Elbow ADM 6.9  8.1  111 A.Elbow - B.Elbow 10 61 ?49 23.2         A.Elbow - Wrist      L Peroneal - EDB     Ankle EDB 3.6 ?6.5 3.6 ?2.0 100 Ankle - EDB 9   9.0     Fib head EDB 8.9  3.3  90.5 Fib head - Ankle 27 51 ?44 8.3     Pop fossa EDB 10.9  3.3  100 Pop fossa - Fib head 10 50 ?44 9.0         Pop fossa - Ankle      L Tibial - AH     Ankle AH 3.5 ?5.8 5.9 ?4.0 100 Ankle - AH 9   11.6     Pop fossa AH 11.0  5.6  96.2 Pop fossa - Ankle 38 51 ?41 12.4             SNC    Nerve / Sites Rec. Site Peak Lat Ref.  Amp Ref. Segments Distance Peak Diff Ref.    ms ms V V  cm ms ms  L Radial - Anatomical snuff box (Forearm)     Forearm Wrist 2.2 ?2.9 20 ?15 Forearm - Wrist 10    L Sural - Ankle (Calf)     Calf Ankle 3.0 ?4.4 6 ?6 Calf - Ankle 14    L Superficial  peroneal - Ankle     Lat leg Ankle 3.1 ?4.4 6 ?6 Lat leg - Ankle 14    L Median, Ulnar - Transcarpal comparison     Median Palm Wrist 2.1 ?2.2 108 ?35 Median Palm - Wrist 8       Ulnar Palm Wrist 1.7 ?2.2 14 ?12 Ulnar Palm - Wrist 8          Median Palm - Ulnar Palm  0.4 ?0.4  L Median - Orthodromic (Dig II, Mid palm)     Dig II Wrist 2.9 ?3.4 18 ?10  Dig II - Wrist 13    L Ulnar - Orthodromic, (Dig V, Mid palm)     Dig V Wrist 2.3 ?3.1 8 ?5 Dig V - Wrist 83                   F  Wave    Nerve F Lat Ref.   ms ms  L Tibial - AH 47.9 ?56.0  L Ulnar - ADM 25.3 ?32.0

## 2019-11-18 ENCOUNTER — Telehealth: Payer: Self-pay | Admitting: Neurology

## 2019-11-18 NOTE — Telephone Encounter (Signed)
Medicaid order sent to [facility]. They will obtain the auth and reach out to the patient to schedule.  

## 2019-11-19 ENCOUNTER — Ambulatory Visit (INDEPENDENT_AMBULATORY_CARE_PROVIDER_SITE_OTHER): Payer: Medicaid Other | Admitting: Obstetrics & Gynecology

## 2019-11-19 ENCOUNTER — Encounter: Payer: Self-pay | Admitting: Obstetrics & Gynecology

## 2019-11-19 ENCOUNTER — Other Ambulatory Visit: Payer: Self-pay

## 2019-11-19 VITALS — BP 108/72 | HR 77 | Ht 62.0 in | Wt 205.0 lb

## 2019-11-19 DIAGNOSIS — N939 Abnormal uterine and vaginal bleeding, unspecified: Secondary | ICD-10-CM

## 2019-11-19 DIAGNOSIS — D250 Submucous leiomyoma of uterus: Secondary | ICD-10-CM

## 2019-11-19 NOTE — Patient Instructions (Signed)
Total Laparoscopic Hysterectomy °A total laparoscopic hysterectomy is a minimally invasive surgery to remove the uterus and cervix. The fallopian tubes and ovaries can also be removed (bilateral salpingo-oophorectomy) during this surgery, if necessary. This procedure may be done to treat problems such as: °· Noncancerous growths in the uterus (uterine fibroids) that cause symptoms. °· A condition that causes the lining of the uterus (endometrium) to grow in other areas (endometriosis). °· Problems with pelvic support. This is caused by weakened muscles of the pelvis following vaginal childbirth or menopause. °· Cancer of the cervix, ovaries, uterus, or endometrium. °· Excessive (dysfunctional) uterine bleeding. °This surgery is performed by inserting a thin, lighted tube (laparoscope) and surgical instruments into small incisions in the abdomen. The laparoscope sends images to a monitor. The images help the health care provider perform the procedure. After this procedure, you will no longer be able to have a baby, and you will no longer have a menstrual period. °Tell a health care provider about: °· Any allergies you have. °· All medicines you are taking, including vitamins, herbs, eye drops, creams, and over-the-counter medicines. °· Any problems you or family members have had with anesthetic medicines. °· Any blood disorders you have. °· Any surgeries you have had. °· Any medical conditions you have. °· Whether you are pregnant or may be pregnant. °What are the risks? °Generally, this is a safe procedure. However, problems may occur, including: °· Infection. °· Bleeding. °· Blood clots in the legs or lungs. °· Allergic reactions to medicines. °· Damage to other structures or organs. °· The risk that the surgery may have to be switched to the regular one in which a large incision is made in the abdomen (abdominal hysterectomy). °What happens before the procedure? °Staying hydrated °Follow instructions from your  health care provider about hydration, which may include: °· Up to 2 hours before the procedure - you may continue to drink clear liquids, such as water, clear fruit juice, black coffee, and plain tea °Eating and drinking restrictions °Follow instructions from your health care provider about eating and drinking, which may include: °· 8 hours before the procedure - stop eating heavy meals or foods such as meat, fried foods, or fatty foods. °· 6 hours before the procedure - stop eating light meals or foods, such as toast or cereal. °· 6 hours before the procedure - stop drinking milk or drinks that contain milk. °· 2 hours before the procedure - stop drinking clear liquids. °Medicines °· Ask your health care provider about: °? Changing or stopping your regular medicines. This is especially important if you are taking diabetes medicines or blood thinners. °? Taking over-the-counter medicines, vitamins, herbs, and supplements. °? Taking medicines such as aspirin and ibuprofen. These medicines can thin your blood. Do not take these medicines unless your health care provider tells you to take them. °· You may be given antibiotic medicine to help prevent infection. °· You may be asked to take laxatives. °· You may be given medicines to help prevent nausea and vomiting after the procedure. °General instructions °· Ask your health care provider how your surgical site will be marked or identified. °· You may be asked to shower with a germ-killing soap. °· Do not use any products that contain nicotine or tobacco, such as cigarettes and e-cigarettes. If you need help quitting, ask your health care provider. °· You may have an exam or testing, such as an ultrasound to determine the size and shape of your pelvic organs. °·   You may have a blood or urine sample taken. °· This procedure can affect the way you feel about yourself. Talk with your health care provider about the physical and emotional changes hysterectomy may  cause. °· Plan to have someone take you home from the hospital or clinic. °· Plan to have a responsible adult care for you for at least 24 hours after you leave the hospital or clinic. This is important. °What happens during the procedure? °· To lower your risk of infection: °? Your health care team will wash or sanitize their hands. °? Your skin will be washed with soap. °? Hair may be removed from the surgical area. °· An IV will be inserted into one of your veins. °· You will be given one or more of the following: °? A medicine to help you relax (sedative). °? A medicine to make you fall asleep (general anesthetic). °· You will be given antibiotic medicine through your IV. °· A tube may be inserted down your throat to help you breathe during the procedure. °· A gas (carbon dioxide) will be used to inflate your abdomen to allow your surgeon to see inside of your abdomen. °· Three or four small incisions will be made in your abdomen. °· A laparoscope will be inserted into one of your incisions. Surgical instruments will be inserted through the other incisions in order to perform the procedure. °· Your uterus and cervix may be removed through your vagina or cut into small pieces and removed through the small incisions. Any other organs that need to be removed will also be removed this way. °· Carbon dioxide will be released from inside of your abdomen. °· Your incisions will be closed with stitches (sutures). °· A bandage (dressing) may be placed over your incisions. °The procedure may vary among health care providers and hospitals. °What happens after the procedure? °· Your blood pressure, heart rate, breathing rate, and blood oxygen level will be monitored until the medicines you were given have worn off. °· You will be given medicine for pain and nausea as needed. °· Do not drive for 24 hours if you received a sedative. °Summary °· Total Laparoscopic hysterectomy is a procedure to remove your uterus, cervix and  sometimes the fallopian tubes and ovaries. °· This procedure can affect the way you feel about yourself. Talk with your health care provider about the physical and emotional changes hysterectomy may cause. °· After this procedure, you will no longer be able to have a baby, and you will no longer have a menstrual period. °· You will be given pain medicine to control discomfort after this procedure. °This information is not intended to replace advice given to you by your health care provider. Make sure you discuss any questions you have with your health care provider. °Document Revised: 07/20/2017 Document Reviewed: 10/18/2016 °Elsevier Patient Education © 2020 [employer] °Total Laparoscopic Hysterectomy, Care After °This sheet gives you information about how to care for yourself after your procedure. Your health care provider may also give you more specific instructions. If you have problems or questions, contact your health care provider. °What can I expect after the procedure? °After the procedure, it is common to have: °· Pain and bruising around your incisions. °· A sore throat, if a breathing tube was used during surgery. °· Fatigue. °· Poor appetite. °· Less interest in sex. °If your ovaries were also removed, it is also common to have symptoms of menopause such as hot flashes, night sweats,   and lack of sleep (insomnia). °Follow these instructions at home: °Bathing °· Do not take baths, swim, or use a hot tub until your health care provider approves. You may need to only take showers for 2-3 weeks. °· Keep your bandage (dressing) dry until your health care provider says it can be removed. °Incision care ° °· Follow instructions from your health care provider about how to take care of your incisions. Make sure you: °? Wash your hands with soap and water before you change your dressing. If soap and water are not available, use hand sanitizer. °? Change your dressing as told by your health care  provider. °? Leave stitches (sutures), skin glue, or adhesive strips in place. These skin closures may need to stay in place for 2 weeks or longer. If adhesive strip edges start to loosen and curl up, you may trim the loose edges. Do not remove adhesive strips completely unless your health care provider tells you to do that. °· Check your incision area every day for signs of infection. Check for: °? Redness, swelling, or pain. °? Fluid or blood. °? Warmth. °? Pus or a bad smell. °Activity °· Get plenty of rest and sleep. °· Do not lift anything that is heavier than 10 lbs (4.5 kg) for one month after surgery, or as long as told by your health care provider. °· Do not drive or use heavy machinery while taking prescription pain medicine. °· Do not drive for 24 hours if you were given a medicine to help you relax (sedative). °· Return to your normal activities as told by your health care provider. Ask your health care provider what activities are safe for you. °Lifestyle ° °· Do not use any products that contain nicotine or tobacco, such as cigarettes and e-cigarettes. These can delay healing. If you need help quitting, ask your health care provider. °· Do not drink alcohol until your health care provider approves. °General instructions °· Do not douche, use tampons, or have sex for at least 6 weeks, or as told by your health care provider. °· Take over-the-counter and prescription medicines only as told by your health care provider. °· To monitor yourself for a fever, take your temperature at least once a day during recovery. °· If you struggle with physical or emotional changes after your procedure, speak with your health care provider or a therapist. °· To prevent or treat constipation while you are taking prescription pain medicine, your health care provider may recommend that you: °? Drink enough fluid to keep your urine clear or pale yellow. °? Take over-the-counter or prescription medicines. °? Eat foods that  are high in fiber, such as fresh fruits and vegetables, whole grains, and beans. °? Limit foods that are high in fat and processed sugars, such as fried and sweet foods. °· Keep all follow-up visits as told by your health care provider. This is important. °Contact a health care provider if: °· You have chills or a fever. °· You have redness, swelling, or pain around an incision. °· You have fluid or blood coming from an incision. °· Your incision feels warm to the touch. °· You have pus or a bad smell coming from an incision. °· An incision breaks open. °· You feel dizzy or light-headed. °· You have pain or bleeding when you urinate. °· You have diarrhea, nausea, or vomiting that does not go away. °· You have abnormal vaginal discharge. °· You have a rash. °· You have pain that does   not get better with medicine. °Get help right away if: °· You have a fever and your symptoms suddenly get worse. °· You have severe abdominal pain. °· You have chest pain. °· You have shortness of breath. °· You faint. °· You have pain, swelling, or redness on your leg. °· You have heavy vaginal bleeding with blood clots. °Summary °· After the procedure it is common to have abdominal pain. Your provider will give you medication for this. °· Do not take baths, swim, or use a hot tub until your health care provider approves. °· Do not lift anything that is heavier than 10 lbs (4.5 kg) for one month after surgery, or as long as told by your health care provider. °· Notify your provider if you have any signs or symptoms of infection after the procedure. °This information is not intended to replace advice given to you by your health care provider. Make sure you discuss any questions you have with your health care provider. °Document Revised: 07/20/2017 Document Reviewed: 10/18/2016 °Elsevier Patient Education © 2020 [employer] ° °

## 2019-11-19 NOTE — Progress Notes (Addendum)
History:  44 y.o. [id number] here today for surgical eval for robot assisted total laparoscopic hysterectomy with bilateral salpingectomy. Pt was evaluated by Dr. Kennon Rounds who deemed her a candidate for a hysterectomy for symptomatic fibroids causing AUB. Pt is s/p 3 prior cesarean sections and a laparotomy for a dermoid cyst. She reports that her ovary was removed at that time. She requested minimally invasive surgery. She is adamant that she does not want any blood products even if it means to save her life. She states that if 'doctors won't take a blood transfusion ,why should she.'  She states that if she cannot answer for herself that her children would want her to get blood. She states that even in that setting she would not want blood.  She reports that she is not opposed to any other nonblood products.   The following portions of the patient's history were reviewed and updated as appropriate: allergies, current medications, past family history, past medical history, past social history, past surgical history and problem list.  Review of Systems:  Pertinent items are noted in HPI.    Objective:  Physical Exam Blood pressure 108/72, pulse 77, height 5\' 2"  (1.575 m), weight 205 lb 0.6 oz (93 kg).  CONSTITUTIONAL: Well-developed, well-nourished female in no acute distress.  HENT:  Normocephalic, atraumatic EYES: Conjunctivae and EOM are normal. No scleral icterus.  NECK: Normal range of motion SKIN: Skin is warm and dry. No rash noted. Not diaphoretic.No pallor. [place]: Alert and oriented to person, place, and time. Normal coordination.  Abd: Soft, nontender and nondistended Pelvic: Normal appearing external genitalia; normal appearing vaginal mucosa and cervix.  Normal discharge.  8 weeks sized mobile uterus, no other palpable masses, no uterine or adnexal tenderness  Labs and Imaging CLINICAL DATA:  Abnormal uterine bleeding  EXAM: ULTRASOUND PELVIS  TRANSVAGINAL  TECHNIQUE: Transvaginal ultrasound examination of the pelvis was performed including evaluation of the uterus, ovaries, adnexal regions, and pelvic cul-de-sac.  COMPARISON:  01/22/2017  FINDINGS: Uterus  Measurements: 8.6 x 6.8 x 6.6 cm = volume: 199 mL. Anteverted. Large transmural leiomyoma at posterior upper uterus 5.8 x 5.1 x 5.9 cm. No additional uterine masses.  Endometrium  Thickness: 3 mm. Tiny amount of endometrial fluid at upper uterine segment endometrial canal. No focal mass.  Right ovary  Measurements: 2.0 x 1.6 x 1.9 cm = volume: 3.2 mL. Normal morphology without mass  Left ovary  Measurements: 2.4 x 2.4 x 2.1 cm = volume: 6.3 mL. Normal morphology without mass  Other findings:  No free pelvic fluid.  No adnexal masses.  IMPRESSION: Large transmural leiomyoma at posterior aspect of upper uterus 5.9 cm greatest size.  Tiny amount of nonspecific endometrial fluid, likely related to compression of the upper to mid endometrial complex by submucosal extension of the large leiomyoma.  Unremarkable ovaries and adnexa.   Judd Gaudier & Plan:/  Symptomatic uterine fibroids. AUB.  Patient desires surgical management with [facility] with bilateral salpingectomy.  She has been scheduled for April 6th. The risks of surgery were discussed in detail with the patient including but not limited to: bleeding which may require transfusion or reoperation; infection which may require prolonged hospitalization or re-hospitalization and antibiotic therapy; injury to bowel, bladder, ureters and major vessels or other surrounding organs; need for additional procedures including laparotomy; thromboembolic phenomenon, incisional problems and other postoperative or anesthesia complications. I did explain to pt the increased risks of bladder injury given her 3 prev cesarean sections. I reviewed that if a bladder  injury were to occur, she would need to war a Foley  cath for ~10 days while the bladder healed. Patient was told that the likelihood that her condition and symptoms will be treated effectively with this surgical management was very high; the postoperative expectations were also discussed in detail. The patient also understands the alternative treatment options which were discussed in full. All questions were answered. Routine preoperative instructions of having nothing to eat or drink after midnight on the day prior to surgery and also coming to the hospital 1 1/2 hours prior to her time of surgery were also emphasized.  She has already had her preoperative appointment. Printed patient education handouts about the procedure were given to the patient to review at home.  Total face-to-face time with patient was 25 min.  Greater than 50% was spent in counseling and coordination of care with the patient.   Harly Pipkins L. Harraway-Smith, M.D., [facility]

## 2019-11-20 ENCOUNTER — Ambulatory Visit (INDEPENDENT_AMBULATORY_CARE_PROVIDER_SITE_OTHER): Payer: Medicaid Other | Admitting: Neurology

## 2019-11-20 ENCOUNTER — Encounter: Payer: Self-pay | Admitting: Neurology

## 2019-11-20 VITALS — BP 112/74 | HR 78 | Temp 97.6°F | Ht 62.0 in | Wt 205.5 lb

## 2019-11-20 DIAGNOSIS — G894 Chronic pain syndrome: Secondary | ICD-10-CM | POA: Diagnosis not present

## 2019-11-20 NOTE — Progress Notes (Signed)
Reason for visit: Neck pain, low back pain  Leah Barber is an 44 y.o. female  History of present illness:  Leah Barber is a 44 year old right-handed black female with a history of chronic low back pain, she has recently moved from [place].  She claims that she has 3 herniated disks in the low back, she was sent to see a neurosurgeon before she came down here but never made that appointment.  The patient recently has had onset of some neck pain and discomfort down the left arm with tingling into the left hand.  EMG and nerve conduction study on the left arm and leg was normal with exception that the patient had denervation of the cervical paraspinal muscles at 2 levels.  She has been set up for MRI of the cervical spine which has not yet been done.  She does not have the report of the prior MRI of the lumbar spine.  The patient was felt to have pseudotumor cerebri, she had a spinal tap done and the opening pressure was 18 cm of water.  The patient was taken off of her Diamox.  Past Medical History:  Diagnosis Date  . Anemia   . Anxiety   . Asthma   . Chronic lower back pain   . Depression   . Diabetes mellitus without complication ([place])   . Dyspnea   . Hyperlipemia   . Hypertension   . IIH (idiopathic intracranial hypertension)   . Pseudotumor cerebri 08/13/2019    Past Surgical History:  Procedure Laterality Date  . CESAREAN SECTION     x 3  . DERMOID CYST  EXCISION    . fluid removed from brain  12/20/2018    Family History  Problem Relation Age of Onset  . Diabetes Mother   . Hypertension Mother   . Cancer Father     Social history:  reports that she has been smoking cigarettes. She has been smoking about 1.00 pack per day. She has never used smokeless tobacco. She reports that she does not drink alcohol or use drugs.    Allergies  Allergen Reactions  . Clindamycin/Lincomycin     Nausea   . Other     REFUSED BLOOD TRANSFUSION AND BLOOD PRODUCTS  . Hydrocodone  Nausea And Vomiting    "stomach pain" "stomach pain"  . Morphine Other (See Comments) and Nausea Only    Medications:  Prior to Admission medications   Medication Sig Start Date End Date Taking? Authorizing Provider  albuterol (VENTOLIN HFA) 108 (90 Base) MCG/ACT inhaler Inhale 2 puffs into the lungs every 4 (four) hours as needed for wheezing or shortness of breath. 10/27/19  Yes Dorena Dew, FNP  amLODipine (NORVASC) 10 MG tablet Take 1 tablet (10 mg total) by mouth daily. 11/13/19  Yes Dorena Dew, FNP  baclofen (LIORESAL) 10 MG tablet Take 1 tablet (10 mg total) by mouth 3 (three) times daily. 06/12/19  Yes Dorena Dew, FNP  Buprenorphine HCl-Naloxone HCl 8-2 MG FILM Place 2 Film under the tongue daily.    Yes [provider]  buPROPion (WELLBUTRIN XL) 300 MG 24 hr tablet Take 300 mg by mouth every morning. 11/03/19  Yes [provider]  cetirizine (ZYRTEC) 10 MG tablet Take 1 tablet (10 mg total) by mouth daily. 07/16/19  Yes Dorena Dew, FNP  cloNIDine (CATAPRES) 0.1 MG tablet TAKE 1 TABLET BY MOUTH THREE TIMES A DAY Patient taking differently: Take 0.1 mg by mouth 3 (  three) times daily.  11/10/19  Yes Dorena Dew, FNP  CONCERTA 36 MG CR tablet Take 36 mg by mouth every morning. 10/13/19  Yes [provider]  DULoxetine (CYMBALTA) 60 MG capsule TAKE 1 CAPSULE (60 MG TOTAL) BY MOUTH 2 (TWO) TIMES DAILY. 10/10/19  Yes Dorena Dew, FNP  FLUoxetine (PROZAC) 40 MG capsule Take 40 mg by mouth every morning. 11/03/19  Yes [provider]  fluticasone (FLONASE) 50 MCG/ACT nasal spray Place 2 sprays into both nostrils daily. 07/02/19  Yes Dorena Dew, FNP  furosemide (LASIX) 20 MG tablet Take 1 tablet (20 mg total) by mouth daily. 11/13/19  Yes Dorena Dew, FNP  gabapentin (NEURONTIN) 400 MG capsule Take 2 capsules (800 mg total) by mouth 3 (three) times daily. 11/13/19  Yes Dorena Dew, FNP  ibuprofen (ADVIL) 600 MG  tablet Take 1 tablet (600 mg total) by mouth every 8 (eight) hours as needed. Patient taking differently: Take 600 mg by mouth every 8 (eight) hours as needed for moderate pain.  08/21/19  Yes Dorena Dew, FNP  latanoprost (XALATAN) 0.005 % ophthalmic solution Place 1 drop into both eyes at bedtime. 11/13/19  Yes Dorena Dew, FNP  metFORMIN (GLUCOPHAGE) 500 MG tablet Take 1 tablet (500 mg total) by mouth 2 (two) times daily with a meal. 11/13/19  Yes Dorena Dew, FNP  miconazole (MICOTIN) 2 % powder Apply topically as needed for itching. 07/07/19  Yes Dorena Dew, FNP  Multiple Vitamins-Minerals (WOMENS MULTIVITAMIN PO) Take 1 tablet by mouth daily.    Yes [provider]  norethindrone (AYGESTIN) 5 MG tablet Take 3 tablets (15 mg total) by mouth daily. 09/15/19 11/20/19 Yes Truett Mainland, DO  omeprazole (PRILOSEC) 40 MG capsule Take 1 capsule (40 mg total) by mouth daily. 08/05/19  Yes Dorena Dew, FNP  ondansetron (ZOFRAN) 4 MG tablet Take 1 tablet (4 mg total) by mouth every 8 (eight) hours as needed for nausea or vomiting. 11/13/19  Yes Dorena Dew, FNP  Spacer/Aero-Holding Chambers (AEROCHAMBER PLUS WITH MASK) inhaler Use as instructed 12/11/16  Yes Muthersbaugh, Jarrett Soho, PA-C    ROS:  Out of a complete 14 system review of symptoms, the patient complains only of the following symptoms, and all other reviewed systems are negative.  Neck pain, low back pain Left arm discomfort and tingling  Blood pressure 112/74, pulse 78, temperature 97.6 F (36.4 C), height 5\' 2"  (1.575 m), weight 205 lb 8 oz (93.2 kg).  Physical Exam  General: The patient is alert and cooperative at the time of the examination.  The patient is markedly obese.  Neuromuscular: The patient has good range of movement of the low back.  With the neck, she lacks about 20 degrees of full lateral rotation to the left, 15 degrees to the right.  Skin: 2+ edema below the knees is seen  bilaterally.   Neurologic Exam  Mental status: The patient is alert and oriented x 3 at the time of the examination. The patient has apparent normal recent and remote memory, with an apparently normal attention span and concentration ability.   Cranial nerves: Facial symmetry is present. Speech is normal, no aphasia or dysarthria is noted. Extraocular movements are full. Visual fields are full.  Motor: The patient has good strength in all 4 extremities.  Sensory examination: Soft touch sensation is symmetric on the face, arms, and legs.  Coordination: The patient has good finger-nose-finger and heel-to-shin bilaterally.  Gait  and station: The patient has a normal gait. Tandem gait is slightly unsteady. Romberg is negative. No drift is seen.  The patient is able to walk on heels and the toes bilaterally.  Reflexes: Deep tendon reflexes are symmetric.   Assessment/Plan:  1.  Chronic low back pain, bilateral leg pain  2.  Neck pain, left arm discomfort  Given the EMG abnormalities, we are checking MRI of the cervical spine.  The patient will try to get the report of the MRI of the lumbar spine.  She does not have pseudotumor cerebri, the medication has been discontinued.  The patient will follow-up here in 4 or 5 months.  [name] MD 11/20/2019 11:45 AM  [facility] [address] [place], [place] [address]  Phone [phone] Fax [phone]

## 2019-11-21 ENCOUNTER — Other Ambulatory Visit (HOSPITAL_COMMUNITY)
Admission: RE | Admit: 2019-11-21 | Discharge: 2019-11-21 | Disposition: A | Discharge status: Home / Self Care | Payer: Medicaid Other | Source: Ambulatory Visit | Attending: Obstetrics & Gynecology | Admitting: Obstetrics & Gynecology

## 2019-11-21 DIAGNOSIS — Z20822 Contact with and (suspected) exposure to covid-19: Secondary | ICD-10-CM | POA: Diagnosis not present

## 2019-11-21 DIAGNOSIS — Z01812 Encounter for preprocedural laboratory examination: Secondary | ICD-10-CM | POA: Insufficient documentation

## 2019-11-21 LAB — SARS CORONAVIRUS 2 (TAT 6-24 HRS): SARS Coronavirus 2: NEGATIVE

## 2019-11-22 ENCOUNTER — Other Ambulatory Visit: Payer: Self-pay | Admitting: Family Medicine

## 2019-11-22 DIAGNOSIS — Z9109 Other allergy status, other than to drugs and biological substances: Secondary | ICD-10-CM

## 2019-11-24 NOTE — Anesthesia Preprocedure Evaluation (Addendum)
Anesthesia Evaluation  Patient identified by MRN, date of birth, ID band  Reviewed: Allergy & Precautions, NPO status , Patient's Chart, lab work & pertinent test results  Airway Mallampati: III  TM Distance: >3 FB Neck ROM: Full    Dental no notable dental hx.    Pulmonary asthma , Current SmokerPatient did not abstain from smoking.,    Pulmonary exam normal breath sounds clear to auscultation       Cardiovascular hypertension, Pt. on medications Normal cardiovascular exam Rhythm:Regular Rate:Normal  ECG: SR, rate 75   Neuro/Psych  Headaches, PSYCHIATRIC DISORDERS Anxiety Depression    GI/Hepatic negative GI ROS, (+)     substance abuse  ,   Endo/Other  diabetes, Oral Hypoglycemic Agents  Renal/GU negative Renal ROS     Musculoskeletal Chronic lower back pain   Abdominal (+) + obese,   Peds  Hematology negative hematology ROS (+)   Anesthesia Other Findings Fibroids  Reproductive/Obstetrics                           Anesthesia Physical Anesthesia Plan  ASA: III  Anesthesia Plan: General   Post-op Pain Management:    Induction: Intravenous  PONV Risk Score and Plan: 3 and Dexamethasone, Ondansetron and Treatment may vary due to age or medical condition  Airway Management Planned: Oral ETT  Additional Equipment:   Intra-op Plan:   Post-operative Plan: Extubation in OR  Informed Consent: I have reviewed the patients History and Physical, chart, labs and discussed the procedure including the risks, benefits and alternatives for the proposed anesthesia with the patient or authorized representative who has indicated his/her understanding and acceptance.     Dental advisory given  Plan Discussed with: CRNA  Anesthesia Plan Comments:       Anesthesia Quick Evaluation

## 2019-11-25 ENCOUNTER — Telehealth: Payer: Self-pay | Admitting: Family Medicine

## 2019-11-25 ENCOUNTER — Ambulatory Visit (HOSPITAL_BASED_OUTPATIENT_CLINIC_OR_DEPARTMENT_OTHER)
Admission: RE | Admit: 2019-11-25 | Discharge: 2019-11-25 | Disposition: A | Discharge status: Home / Self Care | Payer: Medicaid Other | Attending: Obstetrics & Gynecology | Admitting: Obstetrics & Gynecology

## 2019-11-25 ENCOUNTER — Encounter (HOSPITAL_BASED_OUTPATIENT_CLINIC_OR_DEPARTMENT_OTHER)
Admission: RE | Disposition: A | Discharge status: Home / Self Care | Payer: Self-pay | Source: Home / Self Care | Attending: Obstetrics & Gynecology

## 2019-11-25 ENCOUNTER — Other Ambulatory Visit: Payer: Self-pay

## 2019-11-25 ENCOUNTER — Telehealth: Payer: Self-pay | Admitting: Obstetrics & Gynecology

## 2019-11-25 ENCOUNTER — Ambulatory Visit (HOSPITAL_BASED_OUTPATIENT_CLINIC_OR_DEPARTMENT_OTHER): Payer: Medicaid Other | Admitting: Anesthesiology

## 2019-11-25 ENCOUNTER — Other Ambulatory Visit: Payer: Self-pay | Admitting: Family Medicine

## 2019-11-25 ENCOUNTER — Ambulatory Visit (HOSPITAL_BASED_OUTPATIENT_CLINIC_OR_DEPARTMENT_OTHER): Payer: Medicaid Other | Admitting: Physician Assistant

## 2019-11-25 ENCOUNTER — Encounter (HOSPITAL_BASED_OUTPATIENT_CLINIC_OR_DEPARTMENT_OTHER): Payer: Self-pay | Admitting: Obstetrics & Gynecology

## 2019-11-25 DIAGNOSIS — K660 Peritoneal adhesions (postprocedural) (postinfection): Secondary | ICD-10-CM | POA: Diagnosis not present

## 2019-11-25 DIAGNOSIS — J449 Chronic obstructive pulmonary disease, unspecified: Secondary | ICD-10-CM | POA: Insufficient documentation

## 2019-11-25 DIAGNOSIS — N800 Endometriosis of uterus: Secondary | ICD-10-CM | POA: Diagnosis not present

## 2019-11-25 DIAGNOSIS — D219 Benign neoplasm of connective and other soft tissue, unspecified: Secondary | ICD-10-CM

## 2019-11-25 DIAGNOSIS — E119 Type 2 diabetes mellitus without complications: Secondary | ICD-10-CM | POA: Diagnosis not present

## 2019-11-25 DIAGNOSIS — Z885 Allergy status to narcotic agent status: Secondary | ICD-10-CM | POA: Diagnosis not present

## 2019-11-25 DIAGNOSIS — N939 Abnormal uterine and vaginal bleeding, unspecified: Secondary | ICD-10-CM

## 2019-11-25 DIAGNOSIS — D259 Leiomyoma of uterus, unspecified: Secondary | ICD-10-CM

## 2019-11-25 DIAGNOSIS — F329 Major depressive disorder, single episode, unspecified: Secondary | ICD-10-CM | POA: Diagnosis not present

## 2019-11-25 DIAGNOSIS — I10 Essential (primary) hypertension: Secondary | ICD-10-CM | POA: Insufficient documentation

## 2019-11-25 DIAGNOSIS — F419 Anxiety disorder, unspecified: Secondary | ICD-10-CM | POA: Insufficient documentation

## 2019-11-25 DIAGNOSIS — Z9889 Other specified postprocedural states: Secondary | ICD-10-CM

## 2019-11-25 DIAGNOSIS — Z7984 Long term (current) use of oral hypoglycemic drugs: Secondary | ICD-10-CM | POA: Diagnosis not present

## 2019-11-25 DIAGNOSIS — F17210 Nicotine dependence, cigarettes, uncomplicated: Secondary | ICD-10-CM | POA: Diagnosis not present

## 2019-11-25 DIAGNOSIS — Z79899 Other long term (current) drug therapy: Secondary | ICD-10-CM | POA: Insufficient documentation

## 2019-11-25 DIAGNOSIS — Z881 Allergy status to other antibiotic agents status: Secondary | ICD-10-CM | POA: Diagnosis not present

## 2019-11-25 DIAGNOSIS — G932 Benign intracranial hypertension: Secondary | ICD-10-CM | POA: Diagnosis not present

## 2019-11-25 DIAGNOSIS — Z793 Long term (current) use of hormonal contraceptives: Secondary | ICD-10-CM | POA: Insufficient documentation

## 2019-11-25 HISTORY — PX: CYSTOSCOPY: SHX5120

## 2019-11-25 HISTORY — PX: ROBOTIC ASSISTED TOTAL HYSTERECTOMY: SHX6085

## 2019-11-25 LAB — GLUCOSE, CAPILLARY: Glucose-Capillary: 88 mg/dL (ref 70–99)

## 2019-11-25 LAB — NO BLOOD PRODUCTS

## 2019-11-25 LAB — CBC
HCT: 42.8 % (ref 36.0–46.0)
Hemoglobin: 13.3 g/dL (ref 12.0–15.0)
MCH: 31.1 pg (ref 26.0–34.0)
MCHC: 31.1 g/dL (ref 30.0–36.0)
MCV: 100 fL (ref 80.0–100.0)
Platelets: 251 10*3/uL (ref 150–400)
RBC: 4.28 MIL/uL (ref 3.87–5.11)
RDW: 12 % (ref 11.5–15.5)
WBC: 8.1 10*3/uL (ref 4.0–10.5)
nRBC: 0 % (ref 0.0–0.2)

## 2019-11-25 LAB — TYPE AND SCREEN
ABO/RH(D): B POS
Antibody Screen: NEGATIVE

## 2019-11-25 LAB — POCT PREGNANCY, URINE: Preg Test, Ur: NEGATIVE

## 2019-11-25 SURGERY — HYSTERECTOMY, TOTAL, ROBOT-ASSISTED
Anesthesia: General | Site: Abdomen | Laterality: Bilateral

## 2019-11-25 MED ORDER — ROCURONIUM BROMIDE 10 MG/ML (PF) SYRINGE
PREFILLED_SYRINGE | INTRAVENOUS | Status: AC
  Filled 2019-11-25: qty 10

## 2019-11-25 MED ORDER — KETOROLAC TROMETHAMINE 30 MG/ML IJ SOLN
30.0000 mg | Freq: Once | INTRAMUSCULAR | Status: DC
  Filled 2019-11-25: qty 1

## 2019-11-25 MED ORDER — ONDANSETRON HCL 4 MG/2ML IJ SOLN
INTRAMUSCULAR | Status: AC
  Filled 2019-11-25: qty 2

## 2019-11-25 MED ORDER — CLONIDINE HCL 0.1 MG PO TABS
0.1000 mg | ORAL_TABLET | Freq: Three times a day (TID) | ORAL | Status: DC
  Administered 2019-11-25: 0.1 mg via ORAL
  Filled 2019-11-25 (×2): qty 1

## 2019-11-25 MED ORDER — ONDANSETRON HCL 4 MG PO TABS
4.0000 mg | ORAL_TABLET | Freq: Four times a day (QID) | ORAL | Status: DC | PRN
  Filled 2019-11-25: qty 1

## 2019-11-25 MED ORDER — DULOXETINE HCL 60 MG PO CPEP
60.0000 mg | ORAL_CAPSULE | Freq: Two times a day (BID) | ORAL | Status: DC
  Filled 2019-11-25: qty 1

## 2019-11-25 MED ORDER — DOCUSATE SODIUM 100 MG PO CAPS
ORAL_CAPSULE | ORAL | Status: AC
  Filled 2019-11-25: qty 1

## 2019-11-25 MED ORDER — SCOPOLAMINE 1 MG/3DAYS TD PT72
1.0000 | MEDICATED_PATCH | TRANSDERMAL | Status: DC
  Filled 2019-11-25: qty 1

## 2019-11-25 MED ORDER — SIMETHICONE 80 MG PO CHEW
80.0000 mg | CHEWABLE_TABLET | Freq: Four times a day (QID) | ORAL | Status: DC | PRN
  Administered 2019-11-25: 80 mg via ORAL
  Filled 2019-11-25: qty 1

## 2019-11-25 MED ORDER — CEFAZOLIN SODIUM-DEXTROSE 2-4 GM/100ML-% IV SOLN
INTRAVENOUS | Status: AC
  Filled 2019-11-25: qty 100

## 2019-11-25 MED ORDER — PANTOPRAZOLE SODIUM 40 MG PO TBEC
40.0000 mg | DELAYED_RELEASE_TABLET | Freq: Every day | ORAL | Status: DC
  Administered 2019-11-25: 40 mg via ORAL
  Filled 2019-11-25: qty 1

## 2019-11-25 MED ORDER — IBUPROFEN 800 MG PO TABS
800.0000 mg | ORAL_TABLET | Freq: Four times a day (QID) | ORAL | Status: DC
  Filled 2019-11-25: qty 1

## 2019-11-25 MED ORDER — KETOROLAC TROMETHAMINE 30 MG/ML IJ SOLN
30.0000 mg | Freq: Four times a day (QID) | INTRAMUSCULAR | Status: DC
  Administered 2019-11-25: 30 mg via INTRAVENOUS
  Filled 2019-11-25: qty 1

## 2019-11-25 MED ORDER — MIDAZOLAM HCL 5 MG/5ML IJ SOLN
INTRAMUSCULAR | Status: DC | PRN
  Administered 2019-11-25 (×2): .5 mg via INTRAVENOUS

## 2019-11-25 MED ORDER — BUPRENORPHINE HCL-NALOXONE HCL 8-2 MG SL FILM
2.0000 | ORAL_FILM | Freq: Every day | SUBLINGUAL | Status: DC
  Filled 2019-11-25: qty 2

## 2019-11-25 MED ORDER — FENTANYL CITRATE (PF) 250 MCG/5ML IJ SOLN
INTRAMUSCULAR | Status: AC
  Filled 2019-11-25: qty 5

## 2019-11-25 MED ORDER — SCOPOLAMINE 1 MG/3DAYS TD PT72
MEDICATED_PATCH | TRANSDERMAL | Status: AC
  Filled 2019-11-25: qty 1

## 2019-11-25 MED ORDER — HYDROMORPHONE HCL 1 MG/ML IJ SOLN
0.2000 mg | INTRAMUSCULAR | Status: DC | PRN
  Administered 2019-11-25: 0.5 mg via INTRAVENOUS
  Filled 2019-11-25: qty 1

## 2019-11-25 MED ORDER — TRAMADOL HCL 50 MG PO TABS
ORAL_TABLET | ORAL | Status: AC
  Filled 2019-11-25: qty 1

## 2019-11-25 MED ORDER — ACETAMINOPHEN 500 MG PO TABS
1000.0000 mg | ORAL_TABLET | ORAL | Status: DC
  Filled 2019-11-25: qty 2

## 2019-11-25 MED ORDER — GABAPENTIN 400 MG PO CAPS
800.0000 mg | ORAL_CAPSULE | Freq: Three times a day (TID) | ORAL | Status: DC
  Filled 2019-11-25: qty 2

## 2019-11-25 MED ORDER — DEXAMETHASONE SODIUM PHOSPHATE 10 MG/ML IJ SOLN
INTRAMUSCULAR | Status: AC
  Filled 2019-11-25: qty 1

## 2019-11-25 MED ORDER — KETAMINE HCL 10 MG/ML IJ SOLN
INTRAMUSCULAR | Status: AC
  Filled 2019-11-25: qty 1

## 2019-11-25 MED ORDER — KETOROLAC TROMETHAMINE 30 MG/ML IJ SOLN
INTRAMUSCULAR | Status: AC
  Filled 2019-11-25: qty 1

## 2019-11-25 MED ORDER — FLUOXETINE HCL 20 MG PO CAPS
40.0000 mg | ORAL_CAPSULE | Freq: Every morning | ORAL | Status: DC
  Filled 2019-11-25: qty 2

## 2019-11-25 MED ORDER — SUGAMMADEX SODIUM 200 MG/2ML IV SOLN
INTRAVENOUS | Status: DC | PRN
  Administered 2019-11-25: 200 mg via INTRAVENOUS

## 2019-11-25 MED ORDER — MIDAZOLAM HCL 2 MG/2ML IJ SOLN
INTRAMUSCULAR | Status: AC
  Filled 2019-11-25: qty 2

## 2019-11-25 MED ORDER — PROPOFOL 10 MG/ML IV BOLUS
INTRAVENOUS | Status: DC | PRN
  Administered 2019-11-25: 150 mg via INTRAVENOUS
  Administered 2019-11-25: 50 mg via INTRAVENOUS

## 2019-11-25 MED ORDER — LIDOCAINE 2% (20 MG/ML) 5 ML SYRINGE
INTRAMUSCULAR | Status: AC
  Filled 2019-11-25: qty 5

## 2019-11-25 MED ORDER — SIMETHICONE 80 MG PO CHEW
CHEWABLE_TABLET | ORAL | Status: AC
  Filled 2019-11-25: qty 1

## 2019-11-25 MED ORDER — IBUPROFEN 800 MG PO TABS: 800.0000 mg | ORAL_TABLET | Freq: Three times a day (TID) | ORAL | 0 refills | Status: DC | PRN

## 2019-11-25 MED ORDER — GABAPENTIN 300 MG PO CAPS
300.0000 mg | ORAL_CAPSULE | ORAL | Status: DC
  Filled 2019-11-25: qty 1

## 2019-11-25 MED ORDER — LIDOCAINE 2% (20 MG/ML) 5 ML SYRINGE
INTRAMUSCULAR | Status: DC | PRN
  Administered 2019-11-25: 1.5 mg/kg/h via INTRAVENOUS

## 2019-11-25 MED ORDER — BISACODYL 10 MG RE SUPP
10.0000 mg | Freq: Every day | RECTAL | Status: DC | PRN
  Filled 2019-11-25: qty 1

## 2019-11-25 MED ORDER — TRAMADOL HCL 50 MG PO TABS
50.0000 mg | ORAL_TABLET | Freq: Four times a day (QID) | ORAL | Status: DC | PRN
  Administered 2019-11-25: 50 mg via ORAL
  Filled 2019-11-25: qty 1

## 2019-11-25 MED ORDER — OXYCODONE-ACETAMINOPHEN 5-325 MG PO TABS
ORAL_TABLET | ORAL | Status: AC
  Filled 2019-11-25: qty 1

## 2019-11-25 MED ORDER — POLYETHYLENE GLYCOL 3350 17 G PO PACK
17.0000 g | PACK | Freq: Every day | ORAL | Status: DC | PRN
  Filled 2019-11-25: qty 1

## 2019-11-25 MED ORDER — METHYLPHENIDATE HCL ER (OSM) 36 MG PO TBCR
36.0000 mg | EXTENDED_RELEASE_TABLET | Freq: Every morning | ORAL | Status: DC
  Filled 2019-11-25 (×2): qty 1

## 2019-11-25 MED ORDER — HYDROMORPHONE HCL 1 MG/ML IJ SOLN
0.2500 mg | INTRAMUSCULAR | Status: DC | PRN
  Filled 2019-11-25: qty 0.5

## 2019-11-25 MED ORDER — PROPOFOL 10 MG/ML IV BOLUS
INTRAVENOUS | Status: AC
  Filled 2019-11-25: qty 40

## 2019-11-25 MED ORDER — ONDANSETRON HCL 4 MG/2ML IJ SOLN
INTRAMUSCULAR | Status: DC | PRN
  Administered 2019-11-25 (×2): 4 mg via INTRAVENOUS

## 2019-11-25 MED ORDER — PHENYLEPHRINE 40 MCG/ML (10ML) SYRINGE FOR IV PUSH (FOR BLOOD PRESSURE SUPPORT)
PREFILLED_SYRINGE | INTRAVENOUS | Status: DC | PRN
  Administered 2019-11-25: 120 ug via INTRAVENOUS
  Administered 2019-11-25: 80 ug via INTRAVENOUS
  Administered 2019-11-25: 40 ug via INTRAVENOUS
  Administered 2019-11-25: 80 ug via INTRAVENOUS
  Administered 2019-11-25: 120 ug via INTRAVENOUS
  Administered 2019-11-25: 80 ug via INTRAVENOUS

## 2019-11-25 MED ORDER — OXYCODONE-ACETAMINOPHEN 5-325 MG PO TABS: 1.0000 | ORAL_TABLET | Freq: Four times a day (QID) | ORAL | 0 refills | Status: DC | PRN

## 2019-11-25 MED ORDER — DEXAMETHASONE SODIUM PHOSPHATE 4 MG/ML IJ SOLN
INTRAMUSCULAR | Status: DC | PRN
  Administered 2019-11-25: 8 mg via INTRAVENOUS

## 2019-11-25 MED ORDER — INDIGOTINDISULFONATE SODIUM 8 MG/ML IJ SOLN
INTRAMUSCULAR | Status: DC | PRN
  Administered 2019-11-25: 5 mL via INTRAVENOUS

## 2019-11-25 MED ORDER — CEFAZOLIN SODIUM-DEXTROSE 2-4 GM/100ML-% IV SOLN
2.0000 g | INTRAVENOUS | Status: AC
  Administered 2019-11-25: 2 g via INTRAVENOUS
  Filled 2019-11-25: qty 100

## 2019-11-25 MED ORDER — SUCCINYLCHOLINE CHLORIDE 200 MG/10ML IV SOSY
PREFILLED_SYRINGE | INTRAVENOUS | Status: AC
  Filled 2019-11-25: qty 10

## 2019-11-25 MED ORDER — ROCURONIUM BROMIDE 100 MG/10ML IV SOLN
INTRAVENOUS | Status: DC | PRN
  Administered 2019-11-25: 60 mg via INTRAVENOUS
  Administered 2019-11-25: 20 mg via INTRAVENOUS

## 2019-11-25 MED ORDER — CLONIDINE HCL 0.1 MG PO TABS
0.1000 mg | ORAL_TABLET | Freq: Three times a day (TID) | ORAL | Status: DC
  Filled 2019-11-25: qty 1

## 2019-11-25 MED ORDER — METFORMIN HCL 500 MG PO TABS
500.0000 mg | ORAL_TABLET | Freq: Two times a day (BID) | ORAL | Status: DC
  Filled 2019-11-25: qty 1

## 2019-11-25 MED ORDER — LACTATED RINGERS IV SOLN
INTRAVENOUS | Status: DC
  Filled 2019-11-25: qty 1000

## 2019-11-25 MED ORDER — KETOROLAC TROMETHAMINE 30 MG/ML IJ SOLN
INTRAMUSCULAR | Status: DC | PRN
  Administered 2019-11-25: 30 mg via INTRAVENOUS

## 2019-11-25 MED ORDER — AMLODIPINE BESYLATE 10 MG PO TABS
10.0000 mg | ORAL_TABLET | Freq: Every day | ORAL | Status: DC
  Filled 2019-11-25: qty 1

## 2019-11-25 MED ORDER — PROMETHAZINE HCL 25 MG/ML IJ SOLN
6.2500 mg | INTRAMUSCULAR | Status: DC | PRN
  Administered 2019-11-25: 6.25 mg via INTRAVENOUS
  Filled 2019-11-25: qty 1

## 2019-11-25 MED ORDER — KETOROLAC TROMETHAMINE 30 MG/ML IJ SOLN
30.0000 mg | Freq: Once | INTRAMUSCULAR | Status: DC | PRN
  Filled 2019-11-25: qty 1

## 2019-11-25 MED ORDER — SOD CITRATE-CITRIC ACID 500-334 MG/5ML PO SOLN
30.0000 mL | ORAL | Status: DC
  Filled 2019-11-25: qty 30

## 2019-11-25 MED ORDER — DEXTROSE-NACL 5-0.45 % IV SOLN
INTRAVENOUS | Status: AC
  Filled 2019-11-25: qty 1000

## 2019-11-25 MED ORDER — BACLOFEN 10 MG PO TABS
10.0000 mg | ORAL_TABLET | Freq: Three times a day (TID) | ORAL | Status: DC
  Filled 2019-11-25: qty 1

## 2019-11-25 MED ORDER — GABAPENTIN 300 MG PO CAPS
ORAL_CAPSULE | ORAL | Status: AC
  Filled 2019-11-25: qty 1

## 2019-11-25 MED ORDER — ARTIFICIAL TEARS OPHTHALMIC OINT
TOPICAL_OINTMENT | OPHTHALMIC | Status: AC
  Filled 2019-11-25: qty 3.5

## 2019-11-25 MED ORDER — FENTANYL CITRATE (PF) 100 MCG/2ML IJ SOLN
INTRAMUSCULAR | Status: AC
  Filled 2019-11-25: qty 2

## 2019-11-25 MED ORDER — HYDROMORPHONE HCL 1 MG/ML IJ SOLN
INTRAMUSCULAR | Status: AC
  Filled 2019-11-25: qty 1

## 2019-11-25 MED ORDER — PROMETHAZINE HCL 25 MG/ML IJ SOLN
INTRAMUSCULAR | Status: AC
  Filled 2019-11-25: qty 1

## 2019-11-25 MED ORDER — PANTOPRAZOLE SODIUM 40 MG PO TBEC
DELAYED_RELEASE_TABLET | ORAL | Status: AC
  Filled 2019-11-25: qty 1

## 2019-11-25 MED ORDER — ALBUTEROL SULFATE HFA 108 (90 BASE) MCG/ACT IN AERS
2.0000 | INHALATION_SPRAY | RESPIRATORY_TRACT | Status: DC | PRN
  Filled 2019-11-25: qty 6.7

## 2019-11-25 MED ORDER — BUPIVACAINE HCL (PF) 0.5 % IJ SOLN
INTRAMUSCULAR | Status: DC | PRN
  Administered 2019-11-25: 30 mL

## 2019-11-25 MED ORDER — MENTHOL 3 MG MT LOZG
1.0000 | LOZENGE | OROMUCOSAL | Status: DC | PRN
  Filled 2019-11-25: qty 9

## 2019-11-25 MED ORDER — KETAMINE HCL 10 MG/ML IJ SOLN
INTRAMUSCULAR | Status: DC | PRN
  Administered 2019-11-25: 30 mg via INTRAVENOUS

## 2019-11-25 MED ORDER — LIDOCAINE HCL (CARDIAC) PF 100 MG/5ML IV SOSY
PREFILLED_SYRINGE | INTRAVENOUS | Status: DC | PRN
  Administered 2019-11-25: 40 mg via INTRAVENOUS
  Administered 2019-11-25: 60 mg via INTRAVENOUS

## 2019-11-25 MED ORDER — ACETAMINOPHEN 500 MG PO TABS
1000.0000 mg | ORAL_TABLET | Freq: Once | ORAL | Status: AC
  Administered 2019-11-25: 1000 mg via ORAL
  Filled 2019-11-25: qty 2

## 2019-11-25 MED ORDER — DOCUSATE SODIUM 100 MG PO CAPS
100.0000 mg | ORAL_CAPSULE | Freq: Two times a day (BID) | ORAL | Status: DC
  Administered 2019-11-25: 100 mg via ORAL
  Filled 2019-11-25: qty 1

## 2019-11-25 MED ORDER — FENTANYL CITRATE (PF) 100 MCG/2ML IJ SOLN
INTRAMUSCULAR | Status: DC | PRN
  Administered 2019-11-25: 100 ug via INTRAVENOUS
  Administered 2019-11-25 (×5): 50 ug via INTRAVENOUS

## 2019-11-25 MED ORDER — PHENYLEPHRINE 40 MCG/ML (10ML) SYRINGE FOR IV PUSH (FOR BLOOD PRESSURE SUPPORT)
PREFILLED_SYRINGE | INTRAVENOUS | Status: AC
  Filled 2019-11-25: qty 10

## 2019-11-25 MED ORDER — ACETAMINOPHEN 500 MG PO TABS
ORAL_TABLET | ORAL | Status: AC
  Filled 2019-11-25: qty 2

## 2019-11-25 MED ORDER — SODIUM CHLORIDE 0.9 % IR SOLN
Status: DC | PRN
  Administered 2019-11-25: 3000 mL

## 2019-11-25 MED ORDER — OXYCODONE-ACETAMINOPHEN 5-325 MG PO TABS
1.0000 | ORAL_TABLET | ORAL | Status: DC | PRN
  Administered 2019-11-25: 1 via ORAL
  Filled 2019-11-25: qty 2

## 2019-11-25 MED ORDER — ONDANSETRON HCL 4 MG/2ML IJ SOLN
4.0000 mg | Freq: Four times a day (QID) | INTRAMUSCULAR | Status: DC | PRN
  Filled 2019-11-25: qty 2

## 2019-11-25 SURGICAL SUPPLY — 66 items
APPLICATOR ARISTA FLEXITIP XL (MISCELLANEOUS) ×1 IMPLANT
BAG URINE DRAIN 2000ML AR STRL (UROLOGICAL SUPPLIES) ×1 IMPLANT
BLADE SURG 11 STRL SS (BLADE) ×1 IMPLANT
CANISTER SUCT 3000ML PPV (MISCELLANEOUS) ×3 IMPLANT
CATH FOLEY 3WAY  5CC 16FR (CATHETERS) ×2
CATH FOLEY 3WAY 5CC 16FR (CATHETERS) ×2 IMPLANT
COVER BACK TABLE 60X90IN (DRAPES) ×3 IMPLANT
COVER TIP SHEARS 8 DVNC (MISCELLANEOUS) ×2 IMPLANT
COVER TIP SHEARS 8MM DA VINCI (MISCELLANEOUS) ×1
COVER WAND RF STERILE (DRAPES) ×1 IMPLANT
DECANTER SPIKE VIAL GLASS SM (MISCELLANEOUS) ×3 IMPLANT
DEFOGGER SCOPE WARMER CLEARIFY (MISCELLANEOUS) ×3 IMPLANT
DERMABOND ADVANCED (GAUZE/BANDAGES/DRESSINGS) ×1
DERMABOND ADVANCED .7 DNX12 (GAUZE/BANDAGES/DRESSINGS) ×2 IMPLANT
DRAPE ARM DVNC X/XI (DISPOSABLE) ×6 IMPLANT
DRAPE COLUMN DVNC XI (DISPOSABLE) ×2 IMPLANT
DRAPE DA VINCI XI ARM (DISPOSABLE) ×3
DRAPE DA VINCI XI COLUMN (DISPOSABLE) ×1
DURAPREP 26ML APPLICATOR (WOUND CARE) ×3 IMPLANT
ELECT REM PT RETURN 9FT ADLT (ELECTROSURGICAL) ×3
ELECTRODE REM PT RTRN 9FT ADLT (ELECTROSURGICAL) ×2 IMPLANT
GLOVE BIO SURGEON STRL SZ7 (GLOVE) ×10 IMPLANT
GLOVE BIOGEL PI IND STRL 7.0 (GLOVE) ×6 IMPLANT
GLOVE BIOGEL PI IND STRL 7.5 (GLOVE) IMPLANT
GLOVE BIOGEL PI IND STRL 8.5 (GLOVE) IMPLANT
GLOVE BIOGEL PI INDICATOR 7.0 (GLOVE) ×3
GLOVE BIOGEL PI INDICATOR 7.5 (GLOVE) ×1
GLOVE BIOGEL PI INDICATOR 8.5 (GLOVE) ×1
GLOVE SURG SS PI 8.5 STRL IVOR (GLOVE) ×1
GLOVE SURG SS PI 8.5 STRL STRW (GLOVE) IMPLANT
GOWN STRL REUS W/ TWL XL LVL3 (GOWN DISPOSABLE) IMPLANT
GOWN STRL REUS W/TWL XL LVL3 (GOWN DISPOSABLE) ×2
HEMOSTAT ARISTA ABSORB 3G PWDR (HEMOSTASIS) ×1 IMPLANT
HOLDER FOLEY CATH W/STRAP (MISCELLANEOUS) ×1 IMPLANT
IRRIG SUCT STRYKERFLOW 2 WTIP (MISCELLANEOUS) ×3
IRRIGATION SUCT STRKRFLW 2 WTP (MISCELLANEOUS) ×2 IMPLANT
LEGGING LITHOTOMY PAIR STRL (DRAPES) ×3 IMPLANT
MANIFOLD NEPTUNE II (INSTRUMENTS) ×1 IMPLANT
NEEDLE INSUFFLATION 120MM (ENDOMECHANICALS) ×3 IMPLANT
OBTURATOR OPTICAL STANDARD 8MM (TROCAR) ×1
OBTURATOR OPTICAL STND 8 DVNC (TROCAR) ×2
OBTURATOR OPTICALSTD 8 DVNC (TROCAR) ×2 IMPLANT
OCCLUDER COLPOPNEUMO (BALLOONS) ×3 IMPLANT
PACK ROBOT WH (CUSTOM PROCEDURE TRAY) ×3 IMPLANT
PACK ROBOTIC GOWN (GOWN DISPOSABLE) ×3 IMPLANT
PACK TRENDGUARD 450 HYBRID PRO (MISCELLANEOUS) IMPLANT
PAD PREP 24X48 CUFFED NSTRL (MISCELLANEOUS) ×3 IMPLANT
PLUG CATH AND CAP STER (CATHETERS) ×1 IMPLANT
PROTECTOR NERVE ULNAR (MISCELLANEOUS) ×6 IMPLANT
SEAL CANN UNIV 5-8 DVNC XI (MISCELLANEOUS) ×6 IMPLANT
SEAL XI 5MM-8MM UNIVERSAL (MISCELLANEOUS) ×3
SEALER VESSEL DA VINCI XI (MISCELLANEOUS) ×1
SEALER VESSEL EXT DVNC XI (MISCELLANEOUS) ×2 IMPLANT
SET IRRIG Y TYPE TUR BLADDER L (SET/KITS/TRAYS/PACK) ×3 IMPLANT
SET TRI-LUMEN FLTR TB AIRSEAL (TUBING) ×3 IMPLANT
SUT VIC AB 0 CT1 27 (SUTURE) ×2
SUT VIC AB 0 CT1 27XBRD ANBCTR (SUTURE) ×4 IMPLANT
SUT VICRYL 4-0 PS2 18IN ABS (SUTURE) ×6 IMPLANT
SUT VLOC 180 0 9IN  GS21 (SUTURE) ×1
SUT VLOC 180 0 9IN GS21 (SUTURE) ×2 IMPLANT
TIP UTERINE 6.7X10CM GRN DISP (MISCELLANEOUS) ×1 IMPLANT
TOWEL OR 17X26 10 PK STRL BLUE (TOWEL DISPOSABLE) ×3 IMPLANT
TRENDGUARD 450 HYBRID PRO PACK (MISCELLANEOUS) ×3
TROCAR BLADELESS OPT 5 100 (ENDOMECHANICALS) ×1 IMPLANT
TROCAR PORT AIRSEAL 5X120 (TROCAR) ×3 IMPLANT
WATER STERILE IRR 1000ML POUR (IV SOLUTION) ×3 IMPLANT

## 2019-11-25 NOTE — Progress Notes (Signed)
Falling asleep, requiring name to be called to complete preop assessment. Dr. Roanna Banning aware of history of glaucoma, no scopolamine patch applied and took gabapentin at home, dose not given at this time.

## 2019-11-25 NOTE — Telephone Encounter (Signed)
Called by after hours line. Patient not able to pick up prescription for percocet at the pharmacy. I called the pharmacy and spoke with a staff member. The patient has been "locked into either a prescriber or a pharmacy" by Medicaid. The pharmacy staff cannot tell which reason she is locked. The patient will need to call medicaid in the morning to figure out the reason. I explained that the patient had surgery and needs the medicine for post operative pain. They will document the reason and the patient will have to pay out of pocket for it.  I called the patient and explained the situation. She will call Medicaid and figure this out for the future and will pick up her prescription.  Truett Mainland, DO

## 2019-11-25 NOTE — Telephone Encounter (Signed)
TC to pts family post op. No one was in the waiting area. No answer on emergency contact.   Hasson Gaspard L. Harraway-Smith, M.D., Cherlynn June

## 2019-11-25 NOTE — Op Note (Signed)
11/25/2019  10:00 AM  PATIENT:  Leah Barber  44 y.o. female  PRE-OPERATIVE DIAGNOSIS:  Fibroids; AUB  POST-OPERATIVE DIAGNOSIS:  Fibroids; AUB  PROCEDURE:  Procedure(s): XI ROBOTIC ASSISTED TOTAL HYSTERECTOMY WITH SALPINGECTOMY (Bilateral) CYSTOSCOPY (Bilateral)  SURGEON:  Surgeon(s) and Role:    * Lavonia Drafts, MD - Primary    * Donnamae Jude, MD - Assisting  ANESTHESIA:   general  EBL:  50 mL   BLOOD ADMINISTERED:none  DRAINS: none   LOCAL MEDICATIONS USED:  MARCAINE     SPECIMEN:  Source of Specimen:  uterus; fallopian tubes and cervix   DISPOSITION OF SPECIMEN:  PATHOLOGY  COUNTS:  YES  TOURNIQUET:  * No tourniquets in log *  DICTATION: .Note written in EPIC  PLAN OF CARE: prolonged observation and discharge later this evening   PATIENT DISPOSITION:  PACU - hemodynamically stable.   Delay start of Pharmacological VTE agent (>24hrs) due to surgical blood loss or risk of bleeding: not applicable  Complications: none immediate.   The risks, benefits, and alternatives of surgery were explained, understood, and accepted. Consents were signed. All questions were answered.   Findings: post fibroid; normal fallopian tubes and ovaries bilaterally. Adhesions of omentum to the ant abdominal wall.   Pt was taken to the operating room and general anesthesia was applied without complication. She was placed in the dorsal lithotomy position and her abdomen and vagina were prepped and draped after she had been carefully positioned on the table. The cervix was measured and the uterus was sounded to 11 cm. A Rumi uterine manipulator was placed without difficulty. A Foley catheter was placed and it drained clear throughout the case. Gloves were changed and attention was turned to the abdomen. A 64mm incision was made in the left upper quadrant and using an Optiview trocar the abd was entered under direct visualization. CO2 was used to insufflate the abdomen to  approximately 4 L. The above findings were noted. After good pneumoperitoneum was established, a 8 mm trocar was placed 6 cm above the umbilicus.  Laparoscopy confirmed correct placement. She was placed in Trendelenburg position and ports were placed in appropriate positions on her abdomen to allow maximum exposure during the robotic case. Specifically there were two 67mm assistant port placed in the left lower quadrants under direct laproscopic visualization. There were placed ~ 8cm lateral and distal to the midline port.  These were all placed under direct laparoscopic visualization. The robot was docked and I proceeded with a robotic portion of the case.  The adhesions to the abdominal wall were released bluntly without complication. The ureters and the infundibulopelvic ligaments were identified. I excised the fallopian tubes bilaterally. The round ligament on each side was cauterized and cut. The Vessel Sealer instrument was used for this portion. The round ligaments were identified, cauterized and ligated, a bladder flap was created anteriorly. The uterine vessels were identified and cauterized and then cut.The bladder was pushed out of the operative site and an anterior colpotomy was made. The colpotomy incision was extended circumferentially, following the blue outline of the Rumi manipulator. All pedicles were hemostatic.  The uterus was removed from the vagina with the fallopian tube segments. Of note, the uterus was very friable and was removed in pieces as any pressure caused it to tear. It was removed completely. The vaginal cuff was closed with v-lock suture.  Excellent hemostasis was noted throughout. The pelvis was irrigated. The intraabdominal pressure was lowered assess hemostasis. After determining excellent hemostasis,  the robot was undocked. At this point I performed cystoscopy. The cystoscopy revealed blue ejection from both ureters.  The skin from all of the ports was closed with 3-0 vicryl.  30cc of 0.5% Marcaine was injected into the port sites.  The patient was then extubated and taken to recovery in stable condition.   An experienced assistant was required given the standard of surgical care given the complexity of the case.  This assistant was needed for exposure, dissection, suctioning, retraction, instrument exchange and for overall help during the procedure.  Sponge, lap and needle counts were correct x 2.  Yehudit Fulginiti L. Harraway-Smith, M.D., Cherlynn June

## 2019-11-25 NOTE — Discharge Instructions (Signed)

## 2019-11-25 NOTE — Brief Op Note (Signed)
11/25/2019  10:00 AM  PATIENT:  Leah Barber  44 y.o. female  PRE-OPERATIVE DIAGNOSIS:  Fibroids; AUB  POST-OPERATIVE DIAGNOSIS:  Fibroids; AUB  PROCEDURE:  Procedure(s): XI ROBOTIC ASSISTED TOTAL HYSTERECTOMY WITH SALPINGECTOMY (Bilateral) CYSTOSCOPY (Bilateral)  SURGEON:  Surgeon(s) and Role:    * Lavonia Drafts, MD - Primary    * Donnamae Jude, MD - Assisting  ANESTHESIA:   general  EBL:  50 mL   BLOOD ADMINISTERED:none  DRAINS: none   LOCAL MEDICATIONS USED:  MARCAINE     SPECIMEN:  Source of Specimen:  uterus; fallopian tubes and cervix   DISPOSITION OF SPECIMEN:  PATHOLOGY  COUNTS:  YES  TOURNIQUET:  * No tourniquets in log *  DICTATION: .Note written in EPIC  PLAN OF CARE: prolonged observation and discharge later this evening   PATIENT DISPOSITION:  PACU - hemodynamically stable.   Delay start of Pharmacological VTE agent (>24hrs) due to surgical blood loss or risk of bleeding: not applicable  Complications: none immediate.   Nikala Walsworth L. Harraway-Smith, M.D., Cherlynn June

## 2019-11-25 NOTE — Transfer of Care (Signed)
Last Vitals:  Vitals Value Taken Time  BP 175/135 11/25/19 1012  Temp    Pulse 108 11/25/19 1014  Resp 16 11/25/19 1014  SpO2 97 % 11/25/19 1014  Vitals shown include unvalidated device data.  Last Pain:  Vitals:   11/25/19 0555  TempSrc: Oral        Immediate Anesthesia Transfer of Care Note  Patient: Leah Barber  Procedure(s) Performed: Procedure(s) (LRB): XI ROBOTIC ASSISTED TOTAL HYSTERECTOMY WITH SALPINGECTOMY (Bilateral) CYSTOSCOPY (Bilateral)  Patient Location: PACU  Anesthesia Type: General  Level of Consciousness:drowsy  Airway & Oxygen Therapy: Patient Spontanous Breathing and Patient connected to nasal cannula oxygen  Post-op Assessment: Report given to PACU RN and Post -op Vital signs reviewed and stable  Post vital signs: Reviewed and stable  Complications: No apparent anesthesia complications

## 2019-11-25 NOTE — Anesthesia Procedure Notes (Signed)
Procedure Name: Intubation Date/Time: 11/25/2019 7:40 AM Performed by: Mechele Claude, CRNA Pre-anesthesia Checklist: Patient identified, Emergency Drugs available, Suction available and Patient being monitored Patient Re-evaluated:Patient Re-evaluated prior to induction Oxygen Delivery Method: Circle system utilized Preoxygenation: Pre-oxygenation with 100% oxygen Induction Type: IV induction Ventilation: Mask ventilation without difficulty Laryngoscope Size: Mac and 3 Grade View: Grade I Tube type: Oral Tube size: 7.0 mm Number of attempts: 1 Airway Equipment and Method: Stylet and Oral airway Placement Confirmation: ETT inserted through vocal cords under direct vision,  positive ETCO2 and breath sounds checked- equal and bilateral Secured at: 21 cm Tube secured with: Tape Dental Injury: Teeth and Oropharynx as per pre-operative assessment

## 2019-11-25 NOTE — H&P (Signed)
Preoperative History and Physical  Leah Barber is a 44 y.o. [phone] here for surgical management of AUB and dermoid cyst; fibroids  Proposed surgery: RATH with bilateral salpingectomy and resection of dermoid cyst  Past Medical History:  Diagnosis Date  . Anemia   . Anxiety   . Asthma   . Chronic lower back pain   . Depression   . Diabetes mellitus without complication ([place])   . Dyspnea   . Hyperlipemia   . Hypertension   . IIH (idiopathic intracranial hypertension)   . Pseudotumor cerebri 08/13/2019   Past Surgical History:  Procedure Laterality Date  . CESAREAN SECTION     x 3  . DERMOID CYST  EXCISION    . fluid removed from brain  12/20/2018   OB History    Gravida  4   Para  3   Term  3   Preterm      AB  1   Living  3     SAB  1   TAB      Ectopic      Multiple      Live Births  3          Patient denies any cervical dysplasia or STIs. Medications Prior to Admission  Medication Sig Dispense Refill Last Dose  . albuterol (VENTOLIN HFA) 108 (90 Base) MCG/ACT inhaler Inhale 2 puffs into the lungs every 4 (four) hours as needed for wheezing or shortness of breath. 6.7 g 2 Past Week at Unknown time  . amLODipine (NORVASC) 10 MG tablet Take 1 tablet (10 mg total) by mouth daily. 90 tablet 1 11/25/2019 at 0445  . baclofen (LIORESAL) 10 MG tablet Take 1 tablet (10 mg total) by mouth 3 (three) times daily. 90 each 1 Past Week at Unknown time  . Buprenorphine HCl-Naloxone HCl 8-2 MG FILM Place 2 Film under the tongue daily.    11/25/2019 at 0445  . cetirizine (ZYRTEC) 10 MG tablet Take 1 tablet (10 mg total) by mouth daily. 30 tablet 11 11/25/2019 at 0445  . cloNIDine (CATAPRES) 0.1 MG tablet TAKE 1 TABLET BY MOUTH THREE TIMES A DAY (Patient taking differently: Take 0.1 mg by mouth 3 (three) times daily. ) 60 tablet 1 11/25/2019 at 0445  . CONCERTA 36 MG CR tablet Take 36 mg by mouth every morning.   11/24/2019 at Unknown time  . DULoxetine (CYMBALTA) 60 MG  capsule TAKE 1 CAPSULE (60 MG TOTAL) BY MOUTH 2 (TWO) TIMES DAILY. 180 capsule 2 11/25/2019 at 0445  . FLUoxetine (PROZAC) 40 MG capsule Take 40 mg by mouth every morning.   11/25/2019 at 0445  . fluticasone (FLONASE) 50 MCG/ACT nasal spray INHALE 2 SPRAYS IN BOTH NOSTRILS DAILY 16 mL 2 11/24/2019 at Unknown time  . furosemide (LASIX) 20 MG tablet Take 1 tablet (20 mg total) by mouth daily. 90 tablet 1 11/24/2019 at Unknown time  . gabapentin (NEURONTIN) 400 MG capsule Take 2 capsules (800 mg total) by mouth 3 (three) times daily. 90 capsule 5 11/25/2019 at 0445  . ibuprofen (ADVIL) 600 MG tablet Take 1 tablet (600 mg total) by mouth every 8 (eight) hours as needed. (Patient taking differently: Take 600 mg by mouth every 8 (eight) hours as needed for moderate pain. ) 90 tablet 3 11/25/2019 at 0445  . latanoprost (XALATAN) 0.005 % ophthalmic solution Place 1 drop into both eyes at bedtime. 2.5 mL 0 11/24/2019 at Unknown time  . metFORMIN (GLUCOPHAGE) 500 MG tablet Take  1 tablet (500 mg total) by mouth 2 (two) times daily with a meal. 180 tablet 1 11/24/2019 at Unknown time  . Multiple Vitamins-Minerals (WOMENS MULTIVITAMIN PO) Take 1 tablet by mouth daily.    11/24/2019 at Unknown time  . norethindrone (AYGESTIN) 5 MG tablet Take 3 tablets (15 mg total) by mouth daily. 90 tablet 3   . omeprazole (PRILOSEC) 40 MG capsule Take 1 capsule (40 mg total) by mouth daily. 90 capsule 1 11/25/2019 at 0445  . ondansetron (ZOFRAN) 4 MG tablet Take 1 tablet (4 mg total) by mouth every 8 (eight) hours as needed for nausea or vomiting. 30 tablet 0 11/24/2019 at Unknown time  . buPROPion (WELLBUTRIN XL) 300 MG 24 hr tablet Take 300 mg by mouth every morning.     . miconazole (MICOTIN) 2 % powder Apply topically as needed for itching. 70 g 0   . Spacer/Aero-Holding Chambers (AEROCHAMBER PLUS WITH MASK) inhaler Use as instructed 1 each 2     Allergies  Allergen Reactions  . Clindamycin/Lincomycin     Nausea   . Other     REFUSED BLOOD  TRANSFUSION AND BLOOD PRODUCTS  . Hydrocodone Nausea And Vomiting    "stomach pain" "stomach pain"  . Morphine Other (See Comments) and Nausea Only   Social History:   reports that she has been smoking cigarettes. She has been smoking about 1.00 pack per day. She has never used smokeless tobacco. She reports that she does not drink alcohol or use drugs. Family History  Problem Relation Age of Onset  . Diabetes Mother   . Hypertension Mother   . Cancer Father     Review of Systems: Noncontributory  PHYSICAL EXAM: Blood pressure (!) 137/94, pulse 92, temperature (!) 97.5 F (36.4 C), temperature source Oral, resp. rate 18, height 5\' 2"  (1.575 m), weight 93.8 kg, SpO2 98 %. General appearance - alert, well appearing, and in no distress Chest - clear to auscultation, no wheezes, rales or rhonchi, symmetric air entry Heart - normal rate and regular rhythm Abdomen - soft, nontender, nondistended, no masses or organomegaly Pelvic - examination not indicated Extremities - peripheral pulses normal, no pedal edema, no clubbing or cyanosis  Labs: Results for orders placed or performed during the hospital encounter of 11/25/19 (from the past 336 hour(s))  Pregnancy, urine POC   Collection Time: 11/25/19  5:51 AM  Result Value Ref Range   Preg Test, Ur NEGATIVE NEGATIVE  CBC   Collection Time: 11/25/19  6:30 AM  Result Value Ref Range   WBC 8.1 4.0 - 10.5 K/uL   RBC 4.28 3.87 - 5.11 MIL/uL   Hemoglobin 13.3 12.0 - 15.0 g/dL   HCT 42.8 36.0 - 46.0 %   MCV 100.0 80.0 - 100.0 fL   MCH 31.1 26.0 - 34.0 pg   MCHC 31.1 30.0 - 36.0 g/dL   RDW 12.0 11.5 - 15.5 %   Platelets 251 150 - 400 K/uL   nRBC 0.0 0.0 - 0.2 %  Glucose, capillary   Collection Time: 11/25/19  6:49 AM  Result Value Ref Range   Glucose-Capillary 88 70 - 99 mg/dL  Results for orders placed or performed during the hospital encounter of 11/21/19 (from the past 336 hour(s))  SARS CORONAVIRUS 2 (TAT 6-24 HRS) Nasopharyngeal  Nasopharyngeal Swab   Collection Time: 11/21/19  2:53 PM   Specimen: Nasopharyngeal Swab  Result Value Ref Range   SARS Coronavirus 2 NEGATIVE NEGATIVE  Results for orders placed or performed  during the hospital encounter of 11/14/19 (from the past 336 hour(s))  Glucose, capillary   Collection Time: 11/14/19  8:41 AM  Result Value Ref Range   Glucose-Capillary 134 (H) 70 - 99 mg/dL  Basic metabolic panel   Collection Time: 11/14/19  8:48 AM  Result Value Ref Range   Sodium 140 135 - 145 mmol/L   Potassium 3.9 3.5 - 5.1 mmol/L   Chloride 107 98 - 111 mmol/L   CO2 24 22 - 32 mmol/L   Glucose, Bld 138 (H) 70 - 99 mg/dL   BUN 9 6 - 20 mg/dL   Creatinine, Ser 0.61 0.44 - 1.00 mg/dL   Calcium 8.8 (L) 8.9 - 10.3 mg/dL   GFR calc non Af Amer >60 >60 mL/min   GFR calc Af Amer >60 >60 mL/min   Anion gap 9 5 - 15  CBC   Collection Time: 11/14/19  8:48 AM  Result Value Ref Range   WBC 8.4 4.0 - 10.5 K/uL   RBC 4.35 3.87 - 5.11 MIL/uL   Hemoglobin 13.4 12.0 - 15.0 g/dL   HCT 42.8 36.0 - 46.0 %   MCV 98.4 80.0 - 100.0 fL   MCH 30.8 26.0 - 34.0 pg   MCHC 31.3 30.0 - 36.0 g/dL   RDW 11.9 11.5 - 15.5 %   Platelets 258 150 - 400 K/uL   nRBC 0.0 0.0 - 0.2 %  Type and screen All cardiac and thoracic surgeries, spinal fusions, myomectomies, craniotomies, colon & liver resections, total joint revisions, same day c-section with placenta previa or accreta   Collection Time: 11/14/19  8:48 AM  Result Value Ref Range   ABO/RH(D) B POS    Antibody Screen NEG    Sample Expiration 11/28/2019,2359    Extend sample reason      NO TRANSFUSIONS OR PREGNANCY IN THE PAST 3 MONTHS Performed at [facility], [address], [place], [place] [address]   Results for orders placed or performed in visit on 11/14/19 (from the past 336 hour(s))  ABO/Rh   Collection Time: 11/14/19  8:48 AM  Result Value Ref Range   ABO/RH(D)      B POS Performed at [facility], [place]  [address], [place], [place] [address]   Urine Culture   Collection Time: 11/14/19  9:55 AM   Specimen: Urine   UR  Result Value Ref Range   Urine Culture, Routine Final report    Organism ID, Bacteria Comment   Urinalysis Dipstick   Collection Time: 11/14/19 10:02 AM  Result Value Ref Range   Color, UA     Clarity, UA     Glucose, UA Negative Negative   Bilirubin, UA neg    Ketones, UA neg    Spec Grav, UA 1.015 1.010 - 1.025   Blood, UA trace    pH, UA 5.5 5.0 - 8.0   Protein, UA Negative Negative   Urobilinogen, UA 0.2 0.2 or 1.0 E.U./dL   Nitrite, UA neg    Leukocytes, UA Negative Negative   Appearance     Odor    Results for orders placed or performed in visit on 11/11/19 (from the past 336 hour(s))  HgB A1c   Collection Time: 11/11/19 10:56 AM  Result Value Ref Range   Hemoglobin A1C 6.4 (A) 4.0 - 5.6 %   HbA1c POC (<> result, manual entry)     HbA1c, POC (prediabetic range)     HbA1c, POC (controlled diabetic range)  Imaging Studies: CT Head Wo Contrast  Result Date: 10/26/2019 CLINICAL DATA:  Patient hit back of head, now with persistent headache. EXAM: CT HEAD WITHOUT CONTRAST TECHNIQUE: Contiguous axial images were obtained from the base of the skull through the vertex without intravenous contrast. COMPARISON:  Brain MRI-09/25/2019 FINDINGS: Brain: Gray-white differentiation is maintained. No CT evidence of acute large territory infarct. No intraparenchymal or extra-axial mass or hemorrhage. Normal size and configuration of the ventricles and the basilar cisterns. No midline shift. Vascular: No hyperdense vessel or unexpected calcification. Skull: No displaced calvarial fracture. Sinuses/Orbits: Limited visualization of the paranasal sinuses and mastoid air cells is normal. No air-fluid levels. Other: Regional soft tissues appear normal with special attention paid to the occiput and posterior parietal calvarium. IMPRESSION: Negative noncontrast head CT. Electronically  Signed   By: Sandi Mariscal M.D.   On: 10/26/2019 14:39   CT Angio Chest PE W/Cm &/Or Wo Cm  Result Date: 10/26/2019 CLINICAL DATA:  Acute onset chest pain and dyspnea beginning this morning. Clinical concern for pulmonary embolism. EXAM: CT ANGIOGRAPHY CHEST WITH CONTRAST TECHNIQUE: Multidetector CT imaging of the chest was performed using the standard protocol during bolus administration of intravenous contrast. Multiplanar CT image reconstructions and MIPs were obtained to evaluate the vascular anatomy. CONTRAST:  60mL OMNIPAQUE IOHEXOL 350 MG/ML SOLN COMPARISON:  None. FINDINGS: Cardiovascular: Satisfactory opacification of pulmonary arteries noted, and no pulmonary emboli identified. No evidence of thoracic aortic dissection or aneurysm. Mediastinum/Nodes: No masses or pathologically enlarged lymph nodes identified. Lungs/Pleura: No pulmonary mass, infiltrate, or effusion. Upper abdomen: No acute findings. Musculoskeletal: No suspicious bone lesions identified. Review of the MIP images confirms the above findings. IMPRESSION: Negative. No evidence of pulmonary embolism, or other active disease within the thorax. Electronically Signed   By: Marlaine Hind M.D.   On: 10/26/2019 17:29   DG Chest Port 1 View  Result Date: 10/26/2019 CLINICAL DATA:  Woke up this a.m. with difficulty breathing. Pt has had chest pain going on the past few weeks - pain increases with inspiration. PNA dx x a few months ago. H/o Asthma, Diabetes, HTN. Smoker. EXAM: PORTABLE CHEST 1 VIEW COMPARISON:  Chest radiograph 06/09/2019 FINDINGS: The heart size and mediastinal contours are within normal limits. There are a few scattered linear opacities favored to represent atelectasis. No focal consolidation. No pneumothorax or significant pleural effusion. The visualized skeletal structures are unremarkable. IMPRESSION: Scattered atelectasis, otherwise no acute findings. Electronically Signed   By: Audie Pinto M.D.   On: 10/26/2019 15:14    NCV with EMG(electromyography)  Result Date: 11/17/2019 Kathrynn Ducking, MD     11/17/2019  2:41 PM HISTORY: Baxley Tullo is a 44 year old patient with a chronic history of low back pain and bilateral lower extremity sciatica.  This has been present for 10 years.  The patient more recently over the last 3 to 4 months has noted discomfort and numbness and tingling into the left arm to the hand.  The patient has been evaluated for this issue. NERVE CONDUCTION STUDIES: Nerve conduction studies were performed on the left upper extremity. The distal motor latencies and motor amplitudes for the median and ulnar nerves were within normal limits. The nerve conduction velocities for these nerves were also normal. The sensory latencies for the median, radial, and ulnar nerves were normal. The F wave latency for the ulnar nerve was within normal limits. Nerve conduction studies were performed on the left lower extremity. The distal motor latencies and motor amplitudes for the peroneal  and posterior tibial nerves were within normal limits. The nerve conduction velocities for these nerves were also normal. The sensory latencies for the peroneal and sural nerves were within normal limits. The F wave latency for the posterior tibial nerve was within normal limits. EMG STUDIES: EMG study was performed on the left upper extremity: The first dorsal interosseous muscle reveals 2 to 4 K units with full recruitment. No fibrillations or positive waves were noted. The abductor pollicis brevis muscle reveals 2 to 4 K units with full recruitment. No fibrillations or positive waves were noted. The extensor indicis proprius muscle reveals 1 to 3 K units with full recruitment. No fibrillations or positive waves were noted. The pronator teres muscle reveals 2 to 3 K units with full recruitment. No fibrillations or positive waves were noted. The biceps muscle reveals 1 to 2 K units with full recruitment. No fibrillations or positive  waves were noted. The triceps muscle reveals 2 to 4 K units with full recruitment. No fibrillations or positive waves were noted. The anterior deltoid muscle reveals 2 to 3 K units with full recruitment. No fibrillations or positive waves were noted. The cervical paraspinal muscles were tested at 2 levels. 2+ positive waves were seen at both levels tested. There was good relaxation. EMG study was performed on the left lower extremity: The tibialis anterior muscle reveals 2 to 4K motor units with full recruitment. No fibrillations or positive waves were seen. The peroneus tertius muscle reveals 2 to 4K motor units with full recruitment. No fibrillations or positive waves were seen. The medial gastrocnemius muscle reveals 1 to 3K motor units with full recruitment. No fibrillations or positive waves were seen. The vastus lateralis muscle reveals 2 to 4K motor units with full recruitment. No fibrillations or positive waves were seen. The iliopsoas muscle reveals 2 to 4K motor units with full recruitment. No fibrillations or positive waves were seen. The biceps femoris muscle (long head) reveals 2 to 4K motor units with full recruitment. No fibrillations or positive waves were seen. The lumbosacral paraspinal muscles were tested at 3 levels, and revealed no abnormalities of insertional activity at all 3 levels tested. There was good relaxation. IMPRESSION: Nerve conduction studies done on the left upper and left lower extremities were within normal limits.  No evidence of a neuropathy was seen.  EMG evaluation of the left upper extremity was unremarkable but there was evidence of acute denervation seen on the cervical paraspinal muscles on 2 levels.  This would suggest a cervical radiculopathy of indeterminate level.  EMG evaluation of the left lower extremity was unremarkable, no evidence of an overlying lumbosacral radiculopathy was seen. [name] MD 11/17/2019 2:35 PM [facility] [address] [place], [place] [address] Phone [phone] Fax [phone]    Assessment: Patient Active Problem List   Diagnosis Date Noted  . Excessive daytime sleepiness 09/18/2019  . Hypersomnia with sleep apnea 09/18/2019  . Chronic pain syndrome 09/09/2019  . Pseudotumor cerebri 08/13/2019  . Nonintractable headache 07/07/2019  . History of idiopathic intracranial hypertension 07/07/2019  . Intertrigo 07/07/2019  . Environmental allergies 07/07/2019  . Uterine fibroid 07/07/2019  . Chronic bronchitis ([place]) 07/02/2019  . Polysubstance dependence including opioid type drug with complication, episodic abuse, with unspecified complication ([place]) [id number]  . Depression 09/13/2015  . Cluster B personality disorder ([place]) 05/15/2014  . Dependence on nicotine from cigarettes 05/15/2014  . Substance induced mood disorder ([place]) 05/15/2014  . Benzodiazepine abuse ([place]) 09/20/2011  .  Cannabis abuse 05/04/2011  . Cocaine abuse ([place]) 05/04/2011  . Polysubstance dependence ([place]) 05/04/2011  . Candidiasis of vagina 04/30/2011  . Chronic pain in right shoulder 04/30/2011  . Depressive disorder, not elsewhere classified 04/30/2011  . Otitis media, acute 04/30/2011    Plan: Patient will undergo surgical management with [place] with bilateral salpingectomy and resection of dermoid cyst.   The risks of surgery were discussed in detail with the patient including but not limited to: bleeding which may require transfusion or reoperation; infection which may require antibiotics; injury to surrounding organs which may involve bowel, bladder, ureters ; need for additional procedures including laparoscopy or laparotomy; thromboembolic phenomenon, surgical site problems and other postoperative/anesthesia complications. Likelihood of success in alleviating the patient's condition was discussed. Routine postoperative instructions will be reviewed with the patient and her family in detail after surgery.  The  patient concurred with the proposed plan, giving informed written consent for the surgery.  Patient has been NPO since last night she will remain NPO for procedure.  Anesthesia and OR aware.  Preoperative prophylactic antibiotics and SCDs ordered on call to the OR.  To OR when ready.  Yoltzin Ransom L. Ihor Dow, M.D., [facility] 11/25/2019 7:23 AM

## 2019-11-25 NOTE — Anesthesia Postprocedure Evaluation (Signed)
Anesthesia Post Note  Patient: Leah Barber  Procedure(s) Performed: XI ROBOTIC ASSISTED TOTAL HYSTERECTOMY WITH SALPINGECTOMY (Bilateral Abdomen) CYSTOSCOPY (Bilateral )     Patient location during evaluation: PACU Anesthesia Type: General Level of consciousness: awake and alert Pain management: pain level controlled Vital Signs Assessment: post-procedure vital signs reviewed and stable Respiratory status: spontaneous breathing, nonlabored ventilation, respiratory function stable and patient connected to nasal cannula oxygen Cardiovascular status: blood pressure returned to baseline and stable Postop Assessment: no apparent nausea or vomiting Anesthetic complications: no    Last Vitals:  Vitals:   11/25/19 1330 11/25/19 1415  BP: (!) 161/108 (!) 164/99  Pulse: 97 95  Resp: 14   Temp: 37.1 C   SpO2: 95%     Last Pain:  Vitals:   11/25/19 1430  TempSrc:   PainSc: 9                  Jianni Shelden P Caprice Mccaffrey

## 2019-11-26 LAB — SURGICAL PATHOLOGY

## 2019-11-27 ENCOUNTER — Other Ambulatory Visit: Payer: Self-pay

## 2019-11-27 ENCOUNTER — Telehealth: Payer: Self-pay | Admitting: Obstetrics & Gynecology

## 2019-11-27 ENCOUNTER — Telehealth: Payer: Self-pay

## 2019-11-27 DIAGNOSIS — Z9109 Other allergy status, other than to drugs and biological substances: Secondary | ICD-10-CM

## 2019-11-27 DIAGNOSIS — R110 Nausea: Secondary | ICD-10-CM

## 2019-11-27 MED ORDER — FLUTICASONE PROPIONATE 50 MCG/ACT NA SUSP: NASAL | 2 refills | Status: DC

## 2019-11-27 MED ORDER — ONDANSETRON HCL 4 MG PO TABS: 4.0000 mg | ORAL_TABLET | Freq: Three times a day (TID) | ORAL | 0 refills | Status: DC | PRN

## 2019-11-27 NOTE — Telephone Encounter (Signed)
TC to pt. Pt reports nausea that she had prior to surgery.  She woke up this am with the same type of nausea. Pt reports that she previously drank vinegar to help with these sx.  She has adequate pain control.  Pt reports that she had a BM and flatus. Pt is requesting Phenergan.  She denies emesis. She has not tried any conservative options to treat her sx. Pt will try drinking some Peppermint tea to see if her sx resolve. She was instructed to call later today if her sx persist.   Savita Runner L. Harraway-Smith, M.D., [facility]

## 2019-11-27 NOTE — Discharge Summary (Signed)
Physician Discharge Summary  Patient ID: Leah Barber MRN: [id number] DOB/AGE: 46-Oct-1977 46 y.o.  Admit date: 11/25/2019 Discharge date: 11/27/2019  Admission Diagnoses: AUB; uterine fibroids  Discharge Diagnoses:  Principal Problem:   Fibroid Active Problems:   Abnormal uterine bleeding (AUB)   Post-operative state  Discharged Condition: good  Hospital Course: Patient had an uncomplicated surgery; for further details of this surgery, please refer to the operative note. Furthermore, the patient had an uncomplicated postoperative course.  By time of discharge, her pain was controlled on oral pain medications; she was ambulating, voiding without difficulty, tolerating regular diet and passing flatus.  She was deemed stable for discharge to home.    Significant Diagnostic Studies: labs: CBC  Treatments: surgery: Robot assisted total laparoscopic hysterectomy with bilateral salpingectomy.   Discharge Exam: Blood pressure (!) 155/91, pulse 100, temperature 98.9 F (37.2 C), resp. rate 17, height 5\' 2"  (1.575 m), weight 93.8 kg, SpO2 94 %. General appearance: alert and no distress  Disposition: Discharge disposition: 01-Home or Self Care       Discharge Instructions    Call MD for:  difficulty breathing, headache or visual disturbances   Complete by: As directed    Call MD for:  extreme fatigue   Complete by: As directed    Call MD for:  hives   Complete by: As directed    Call MD for:  persistant dizziness or light-headedness   Complete by: As directed    Call MD for:  persistant nausea and vomiting   Complete by: As directed    Call MD for:  redness, tenderness, or signs of infection (pain, swelling, redness, odor or green/yellow discharge around incision site)   Complete by: As directed    Call MD for:  severe uncontrolled pain   Complete by: As directed    Call MD for:  temperature >100.4   Complete by: As directed    Diet - low sodium heart healthy   Complete by: As  directed    Discharge wound care:   Complete by: As directed    Keep port sites clean and dry.   Driving Restrictions   Complete by: As directed    No driving for 2 weeks post op   Increase activity slowly   Complete by: As directed    Lifting restrictions   Complete by: As directed    No heavy lifting for 6 weeks post op   Sexual Activity Restrictions   Complete by: As directed    NO sexual intercourse for 8 weeks post op     Allergies as of 11/25/2019      Reactions   Clindamycin/lincomycin    Nausea    Other    REFUSED BLOOD TRANSFUSION AND BLOOD PRODUCTS   Hydrocodone Nausea And Vomiting   "stomach pain" "stomach pain"   Morphine Other (See Comments), Nausea Only      Medication List    TAKE these medications   aerochamber plus with mask inhaler Use as instructed   albuterol 108 (90 Base) MCG/ACT inhaler Commonly known as: VENTOLIN HFA Inhale 2 puffs into the lungs every 4 (four) hours as needed for wheezing or shortness of breath.   amLODipine 10 MG tablet Commonly known as: NORVASC Take 1 tablet (10 mg total) by mouth daily.   baclofen 10 MG tablet Commonly known as: LIORESAL Take 1 tablet (10 mg total) by mouth 3 (three) times daily.   Buprenorphine HCl-Naloxone HCl 8-2 MG Film Place 2 Film under  the tongue daily.   buPROPion 300 MG 24 hr tablet Commonly known as: WELLBUTRIN XL Take 300 mg by mouth every morning.   cetirizine 10 MG tablet Commonly known as: ZYRTEC Take 1 tablet (10 mg total) by mouth daily.   cloNIDine 0.1 MG tablet Commonly known as: CATAPRES TAKE 1 TABLET BY MOUTH THREE TIMES A DAY   Concerta 36 MG CR tablet Generic drug: methylphenidate Take 36 mg by mouth every morning.   DULoxetine 60 MG capsule Commonly known as: CYMBALTA TAKE 1 CAPSULE (60 MG TOTAL) BY MOUTH 2 (TWO) TIMES DAILY.   FLUoxetine 40 MG capsule Commonly known as: PROZAC Take 40 mg by mouth every morning.   furosemide 20 MG tablet Commonly known as:  LASIX Take 1 tablet (20 mg total) by mouth daily.   gabapentin 400 MG capsule Commonly known as: NEURONTIN Take 2 capsules (800 mg total) by mouth 3 (three) times daily.   ibuprofen 800 MG tablet Commonly known as: ADVIL Take 1 tablet (800 mg total) by mouth every 8 (eight) hours as needed. What changed:   medication strength  how much to take   latanoprost 0.005 % ophthalmic solution Commonly known as: XALATAN Place 1 drop into both eyes at bedtime.   metFORMIN 500 MG tablet Commonly known as: GLUCOPHAGE Take 1 tablet (500 mg total) by mouth 2 (two) times daily with a meal.   miconazole 2 % powder Commonly known as: MICOTIN Apply topically as needed for itching.   norethindrone 5 MG tablet Commonly known as: AYGESTIN Take 3 tablets (15 mg total) by mouth daily.   omeprazole 40 MG capsule Commonly known as: PRILOSEC Take 1 capsule (40 mg total) by mouth daily.   oxyCODONE-acetaminophen 5-325 MG tablet Commonly known as: PERCOCET/ROXICET Take 1-2 tablets by mouth every 6 (six) hours as needed.   WOMENS MULTIVITAMIN PO Take 1 tablet by mouth daily.            Discharge Care Instructions  (From admission, onward)         Start     Ordered   11/25/19 0000  Discharge wound care:    Comments: Keep port sites clean and dry.   11/25/19 1115         [place] In 2 weeks.   Specialty: Obstetrics and Gynecology Contact information: [place] [phone]          Signed: Lavonia Drafts 11/27/2019, 9:26 AM

## 2019-11-27 NOTE — Telephone Encounter (Signed)
Patient called stating that she is nauseated. Patient had surgery on Tuesday and would like some phenergan for her nausea. Patient states she is feeling very sick to her stomach- patient questioned if it could be from her sinus draining. Patient then requested Flonase I made her aware she can buy this over the counter.   Patient then states can I fill the prescription for phenergan right now. I let the patient know I will have to ask the provider when she gets to clinic. Kathrene Alu RN

## 2019-12-05 ENCOUNTER — Telehealth: Payer: Self-pay

## 2019-12-05 NOTE — Telephone Encounter (Signed)
Patient called stating she is having generalized pain. Patient states she is fighting a cold. Patient advised to go to the emergency room if she is having overall pain. Patient states she doesn't have transportation there. Will route message to provider. Kathrene Alu RN

## 2019-12-09 ENCOUNTER — Other Ambulatory Visit: Payer: Self-pay | Admitting: Family Medicine

## 2019-12-09 DIAGNOSIS — R110 Nausea: Secondary | ICD-10-CM

## 2019-12-09 MED ORDER — ONDANSETRON HCL 4 MG PO TABS: 4.0000 mg | ORAL_TABLET | Freq: Four times a day (QID) | ORAL | 1 refills | Status: DC | PRN

## 2019-12-09 MED ORDER — ONDANSETRON HCL 4 MG PO TABS: 4.0000 mg | ORAL_TABLET | Freq: Three times a day (TID) | ORAL | 0 refills | Status: DC | PRN

## 2019-12-09 NOTE — Progress Notes (Signed)
Meds ordered this encounter  Medications  . ondansetron (ZOFRAN) 4 MG tablet    Sig: Take 1 tablet (4 mg total) by mouth every 6 (six) hours as needed for nausea or vomiting.    Dispense:  60 tablet    Refill:  1    Order Specific Question:   Supervising Provider    Answer:   Tresa Garter [id number]     [name]  APRN, MSN, FNP-C [facility] [address] [place], [place] [address] [phone]

## 2019-12-09 NOTE — Telephone Encounter (Signed)
Refill for Zofran sent to pharmacy. Thanks!

## 2019-12-10 ENCOUNTER — Telehealth: Payer: Self-pay

## 2019-12-10 ENCOUNTER — Encounter: Payer: Medicaid Other | Admitting: Obstetrics & Gynecology

## 2019-12-10 NOTE — Telephone Encounter (Signed)
Patient called stating that she cant come to her appointment because she turned away her transportation because it was a [employer]. Patient states she was trying to take care of herself because she was worried about riding in a bumpy van.   Explained to patient there are no precautions about the type of transportation she can take.   Patient states she has some concerns for the doctor. I rescheduled her to the first available on Monday April 26th. Patient is upset I dont have anything sooner- explained that Dr. Maylene Roes is not in our office everyday of the week. Patient accepts Monday appointment. Kathrene Alu RN

## 2019-12-15 ENCOUNTER — Other Ambulatory Visit: Payer: Self-pay

## 2019-12-15 ENCOUNTER — Ambulatory Visit (INDEPENDENT_AMBULATORY_CARE_PROVIDER_SITE_OTHER): Payer: Medicaid Other | Admitting: Obstetrics & Gynecology

## 2019-12-15 ENCOUNTER — Encounter: Payer: Self-pay | Admitting: Obstetrics & Gynecology

## 2019-12-15 VITALS — BP 118/68 | HR 76 | Ht 62.0 in | Wt 202.1 lb

## 2019-12-15 DIAGNOSIS — Z9889 Other specified postprocedural states: Secondary | ICD-10-CM

## 2019-12-15 NOTE — Patient Instructions (Signed)

## 2019-12-15 NOTE — Progress Notes (Signed)
History:  44 y.o.LMP here today for her 2 week post op check. She missed her visit last week so she is 3 weeks post op. Pt is s/p RATH with bilateral salpingectomy on 11/25/2019.  Pt reports that she is doing well. She is eating and passing stools without difficulty.   She is still on NSAIDS which she was prev on for chronic pain. Pt reports a h/o nausea that predated her surgery. She reports that she is scheduled to see her primary doc for eval of this.     The following portions of the patient's history were reviewed and updated as appropriate: allergies, current medications, past family history, past medical history, past social history, past surgical history and problem list.  Review of Systems:  Pertinent items are noted in HPI.    Objective:  Physical Exam BP 118/68   Pulse 76   Ht 5\' 2"  (1.575 m)   Wt 202 lb 1.9 oz (91.7 kg)   LMP 09/14/2019 (Approximate)   BMI 36.97 kg/m   CONSTITUTIONAL: Well-developed, well-nourished female in no acute distress.  HENT:  Normocephalic, atraumatic EYES: Conjunctivae and EOM are normal. No scleral icterus.  NECK: Normal range of motion SKIN: Skin is warm and dry. No rash noted. Not diaphoretic.No pallor. [place]: Alert and oriented to person, place, and time. Normal coordination.  Abd: Soft, nontender and nondistended; her port sites are healing well. .  Pelvic: deferred  Labs and Imaging Surg path 11/25/2019 FINAL MICROSCOPIC DIAGNOSIS:   A. UTERUS, CERVIX, BILATERAL FALLOPIAN TUBES, HYSTERECTOMY WITH  SALPINGECTOMY:  - Uterus with leiomyomata, largest measuring 7 cm  - Benign inactive endometrium  - Adenomyosis  - Benign unremarkable cervix  - Segments of benign unremarkable bilateral fallopian tubes  - No evidence of malignancy    Assessment & Plan:  2 week post op check following RATH with bilateral salpingectomy.   Doing well  Reviewed her surg path.   Reviewed post op instructions and activities  Gradual increase in  activities  F/u in 3 weeks or sooner prn  Reviewed no intercourse for 8 weeks post op  All questions answered.   Sharlette Jansma L. Harraway-Smith, M.D., Cherlynn June

## 2019-12-16 ENCOUNTER — Ambulatory Visit: Payer: Medicaid Other | Admitting: Family Medicine

## 2019-12-17 ENCOUNTER — Other Ambulatory Visit: Payer: Medicaid Other

## 2019-12-18 ENCOUNTER — Other Ambulatory Visit: Payer: Self-pay | Admitting: Family Medicine

## 2019-12-23 ENCOUNTER — Other Ambulatory Visit: Payer: Self-pay

## 2019-12-23 DIAGNOSIS — Z9109 Other allergy status, other than to drugs and biological substances: Secondary | ICD-10-CM

## 2019-12-23 NOTE — Telephone Encounter (Signed)
Pt is requesting a refills on her Prozac, and Ibuprofen. Pt is said that you  Have been writing the this for her however this doesn't show that you have been given this to her.

## 2019-12-24 ENCOUNTER — Encounter: Payer: Self-pay | Admitting: Nurse Practitioner

## 2019-12-24 ENCOUNTER — Other Ambulatory Visit: Payer: Self-pay | Admitting: Family Medicine

## 2019-12-24 ENCOUNTER — Ambulatory Visit (INDEPENDENT_AMBULATORY_CARE_PROVIDER_SITE_OTHER): Payer: Medicaid Other | Admitting: Nurse Practitioner

## 2019-12-24 ENCOUNTER — Other Ambulatory Visit: Payer: Self-pay

## 2019-12-24 ENCOUNTER — Ambulatory Visit: Payer: Medicaid Other | Admitting: Nurse Practitioner

## 2019-12-24 VITALS — BP 129/85 | HR 89 | Wt 200.6 lb

## 2019-12-24 DIAGNOSIS — Z8719 Personal history of other diseases of the digestive system: Secondary | ICD-10-CM | POA: Diagnosis not present

## 2019-12-24 DIAGNOSIS — K5901 Slow transit constipation: Secondary | ICD-10-CM | POA: Diagnosis not present

## 2019-12-24 DIAGNOSIS — R110 Nausea: Secondary | ICD-10-CM

## 2019-12-24 DIAGNOSIS — Z8711 Personal history of peptic ulcer disease: Secondary | ICD-10-CM

## 2019-12-24 DIAGNOSIS — R8290 Unspecified abnormal findings in urine: Secondary | ICD-10-CM | POA: Diagnosis not present

## 2019-12-24 LAB — POCT URINALYSIS DIPSTICK
Bilirubin, UA: NEGATIVE
Blood, UA: NEGATIVE
Glucose, UA: NEGATIVE
Ketones, UA: NEGATIVE
Leukocytes, UA: NEGATIVE
Nitrite, UA: NEGATIVE
Protein, UA: NEGATIVE
Spec Grav, UA: 1.02 (ref 1.010–1.025)
Urobilinogen, UA: 0.2 E.U./dL
pH, UA: 5.5 (ref 5.0–8.0)

## 2019-12-24 MED ORDER — IBUPROFEN 600 MG PO TABS: 600.0000 mg | ORAL_TABLET | Freq: Three times a day (TID) | ORAL | 2 refills | Status: AC | PRN

## 2019-12-24 MED ORDER — DOCUSATE SODIUM 100 MG PO CAPS: 100.0000 mg | ORAL_CAPSULE | Freq: Two times a day (BID) | ORAL | 2 refills | Status: AC

## 2019-12-24 MED ORDER — ONDANSETRON 8 MG PO TBDP: 8.0000 mg | ORAL_TABLET | Freq: Two times a day (BID) | ORAL | 0 refills | Status: AC

## 2019-12-24 MED ORDER — FLUOXETINE HCL 40 MG PO CAPS: 40.0000 mg | ORAL_CAPSULE | Freq: Every morning | ORAL | 2 refills | Status: DC

## 2019-12-24 NOTE — Progress Notes (Signed)
[facility] [place],   [id number] Phone:  [phone]   Fax:  [phone]   Acute Office Visit  Subjective:    Patient ID: Leah Barber, female    DOB: July 07, 1976, 44 y.o.   MRN: [id number]  Chief Complaint  Patient presents with  . Nausea    nausea, having trouble w/ bowl, stomach , taking nausea  medication everyday     HPI Patient is in today for an acute visit.. She  has a past medical history of Anemia, Anxiety, Asthma, Chronic lower back pain, Depression, Diabetes mellitus without complication ([place]), Dyspnea, Hyperlipemia, Hypertension, IIH (idiopathic intracranial hypertension), and Pseudotumor cerebri (08/13/2019).   She suffered a fall some time in Feb. She hit her head.  She was unable to be evaluated at that time due to having to leave for an outside family emergency. She is status post robotic assisted hysterectomy.  Nausea / Vomiting Patient complains of nausea . Onset of symptoms was about a month ago. Patient describes nausea as moderate. Vomiting has not occurred. Symptoms have been associated with Decreased appetite, chills and questionable constipation..  She has lost 2 pounds.  Patient denies alcohol overuse, fever, melena and possibility of pregnancy. Symptoms have waxed and waned. Evaluation to date has been none. Treatment to date has been Zofran..   Constipation Patient complains of constipation. Onset was 1 month ago. Patient has been having 2 firm and formed stools per week. Defecation has been difficult and painful.  She admits that with her bowel movements she has sweats and nausea.  Co-Morbid conditions:recent surgery. Symptoms have been intermittent. Current Health Habits: Eating fiber? no, Exercise? no, Adequate hydration? questionable.  She has not taken any over the counter/prescription laxative. Despite her current symptoms.    She is requesting a refill of ibuprofen 600 mg.  She admits that she has been diagnosed  in the past with gastric ulcers.  She is not sure if this has resolved.  She does not use over-the-counter ibuprofen or Motrin.  She is states "they scare her".  She has history of substance use and admits that she does not want to take anything stronger than ibuprofen 600 mg. She has a history of depression and is currently out of her fluoxetine.  She is requesting a refill.   Past Medical History:  Diagnosis Date  . Anemia   . Anxiety   . Asthma   . Chronic lower back pain   . Depression   . Diabetes mellitus without complication ([place])   . Dyspnea   . Hyperlipemia   . Hypertension   . IIH (idiopathic intracranial hypertension)   . Pseudotumor cerebri 08/13/2019    Past Surgical History:  Procedure Laterality Date  . CESAREAN SECTION     x 3  . CYSTOSCOPY Bilateral 11/25/2019   Procedure: CYSTOSCOPY;  Surgeon: Lavonia Drafts, MD;  Location: [facility];  Service: Gynecology;  Laterality: Bilateral;  . DERMOID CYST  EXCISION    . fluid removed from brain  12/20/2018  . ROBOTIC ASSISTED TOTAL HYSTERECTOMY Bilateral 11/25/2019   Procedure: XI ROBOTIC ASSISTED TOTAL HYSTERECTOMY WITH SALPINGECTOMY;  Surgeon: Lavonia Drafts, MD;  Location: [facility];  Service: Gynecology;  Laterality: Bilateral;    Family History  Problem Relation Age of Onset  . Diabetes Mother   . Hypertension Mother   . Cancer Father     Social History   Socioeconomic History  . Marital status: Legally  Separated    Spouse name: Not on file  . Number of children: Not on file  . Years of education: Not on file  . Highest education level: Not on file  Occupational History  . Not on file  Tobacco Use  . Smoking status: Current Every Day Smoker    Packs/day: 1.00    Types: Cigarettes  . Smokeless tobacco: Never Used  Substance and Sexual Activity  . Alcohol use: No  . Drug use: No  . Sexual activity: Not Currently  Other Topics Concern  . Not on file    Social History Narrative  . Not on file   Social Determinants of Health   Financial Resource Strain:   . Difficulty of Paying Living Expenses:   Food Insecurity:   . Worried About Charity fundraiser in the Last Year:   . Arboriculturist in the Last Year:   Transportation Needs:   . Film/video editor (Medical):   Marland Kitchen Lack of Transportation (Non-Medical):   Physical Activity:   . Days of Exercise per Week:   . Minutes of Exercise per Session:   Stress:   . Feeling of Stress :   Social Connections:   . Frequency of Communication with Friends and Family:   . Frequency of Social Gatherings with Friends and Family:   . Attends Religious Services:   . Active Member of Clubs or Organizations:   . Attends Archivist Meetings:   Marland Kitchen Marital Status:   Intimate Partner Violence:   . Fear of Current or Ex-Partner:   . Emotionally Abused:   Marland Kitchen Physically Abused:   . Sexually Abused:     Outpatient Medications Prior to Visit  Medication Sig Dispense Refill  . albuterol (VENTOLIN HFA) 108 (90 Base) MCG/ACT inhaler Inhale 2 puffs into the lungs every 4 (four) hours as needed for wheezing or shortness of breath. 6.7 g 2  . amLODipine (NORVASC) 10 MG tablet Take 1 tablet (10 mg total) by mouth daily. 90 tablet 1  . baclofen (LIORESAL) 10 MG tablet Take 1 tablet (10 mg total) by mouth 3 (three) times daily. 90 each 1  . Buprenorphine HCl-Naloxone HCl 8-2 MG FILM Place 2 Film under the tongue daily.     Marland Kitchen buPROPion (WELLBUTRIN XL) 300 MG 24 hr tablet Take 300 mg by mouth every morning.    . cetirizine (ZYRTEC) 10 MG tablet Take 1 tablet (10 mg total) by mouth daily. 30 tablet 11  . cloNIDine (CATAPRES) 0.1 MG tablet TAKE 1 TABLET BY MOUTH THREE TIMES A DAY 60 tablet 1  . CONCERTA 36 MG CR tablet Take 36 mg by mouth every morning.    . DULoxetine (CYMBALTA) 60 MG capsule TAKE 1 CAPSULE (60 MG TOTAL) BY MOUTH 2 (TWO) TIMES DAILY. 180 capsule 2  . fluticasone (FLONASE) 50 MCG/ACT  nasal spray INHALE 2 SPRAYS IN BOTH NOSTRILS DAILY 16 mL 2  . furosemide (LASIX) 20 MG tablet Take 1 tablet (20 mg total) by mouth daily. 90 tablet 1  . gabapentin (NEURONTIN) 400 MG capsule Take 2 capsules (800 mg total) by mouth 3 (three) times daily. 90 capsule 5  . latanoprost (XALATAN) 0.005 % ophthalmic solution Place 1 drop into both eyes at bedtime. 2.5 mL 0  . metFORMIN (GLUCOPHAGE) 500 MG tablet Take 1 tablet (500 mg total) by mouth 2 (two) times daily with a meal. 180 tablet 1  . miconazole (MICOTIN) 2 % powder Apply topically as needed for  itching. 70 g 0  . Multiple Vitamins-Minerals (WOMENS MULTIVITAMIN PO) Take 1 tablet by mouth daily.     Marland Kitchen omeprazole (PRILOSEC) 40 MG capsule Take 1 capsule (40 mg total) by mouth daily. 90 capsule 1  . ondansetron (ZOFRAN) 4 MG tablet Take 1 tablet (4 mg total) by mouth every 6 (six) hours as needed for nausea or vomiting. 60 tablet 1  . oxyCODONE-acetaminophen (PERCOCET/ROXICET) 5-325 MG tablet Take 1-2 tablets by mouth every 6 (six) hours as needed. 30 tablet 0  . Spacer/Aero-Holding Chambers (AEROCHAMBER PLUS WITH MASK) inhaler Use as instructed 1 each 2  . FLUoxetine (PROZAC) 40 MG capsule Take 40 mg by mouth every morning.    Marland Kitchen ibuprofen (ADVIL) 800 MG tablet Take 1 tablet (800 mg total) by mouth every 8 (eight) hours as needed. 30 tablet 0  . norethindrone (AYGESTIN) 5 MG tablet Take 3 tablets (15 mg total) by mouth daily. 90 tablet 3   No facility-administered medications prior to visit.    Allergies  Allergen Reactions  . Clindamycin/Lincomycin     Nausea   . Other     REFUSED BLOOD TRANSFUSION AND BLOOD PRODUCTS  . Hydrocodone Nausea And Vomiting    "stomach pain" "stomach pain"  . Morphine Other (See Comments) and Nausea Only    Review of Systems  All other systems reviewed and are negative.      Objective:    Physical Exam Vitals reviewed.  Constitutional:      General: She is not in acute distress.    Appearance:  She is obese.  Cardiovascular:     Rate and Rhythm: Normal rate and regular rhythm.     Pulses: Normal pulses.     Heart sounds: Normal heart sounds.  Pulmonary:     Effort: Pulmonary effort is normal.     Breath sounds: Normal breath sounds.  Abdominal:     Comments: Hypoactive bowel sounds and bloating  Musculoskeletal:        General: Normal range of motion.     Cervical back: Normal range of motion.  Skin:    General: Skin is warm and dry.  Neurological:     General: No focal deficit present.     Mental Status: She is alert and oriented to person, place, and time.  Psychiatric:        Mood and Affect: Mood normal.        Behavior: Behavior normal.        Thought Content: Thought content normal.        Judgment: Judgment normal.     BP 129/85 (BP Location: Right Arm)   Pulse 89   Wt 200 lb 9.6 oz (91 kg)   LMP 09/14/2019 (Approximate)   SpO2 94%   BMI 36.69 kg/m  Wt Readings from Last 3 Encounters:  12/24/19 200 lb 9.6 oz (91 kg)  12/15/19 202 lb 1.9 oz (91.7 kg)  11/25/19 206 lb 12.8 oz (93.8 kg)    Health Maintenance Due  Topic Date Due  . COVID-19 Vaccine (1) Never done    There are no preventive care reminders to display for this patient.   Lab Results  Component Value Date   TSH 1.610 12/24/2019   Lab Results  Component Value Date   WBC 8.1 11/25/2019   HGB 13.3 11/25/2019   HCT 42.8 11/25/2019   MCV 100.0 11/25/2019   PLT 251 11/25/2019   Lab Results  Component Value Date   NA 140 11/14/2019  K 3.9 11/14/2019   CO2 24 11/14/2019   GLUCOSE 138 (H) 11/14/2019   BUN 9 11/14/2019   CREATININE 0.61 11/14/2019   BILITOT 0.5 10/26/2019   ALKPHOS 78 10/26/2019   AST 26 10/26/2019   ALT 29 10/26/2019   PROT 7.4 10/26/2019   ALBUMIN 3.7 10/26/2019   CALCIUM 8.8 (L) 11/14/2019   ANIONGAP 9 11/14/2019   Lab Results  Component Value Date   CHOL 216 (H) 11/23/2016   Lab Results  Component Value Date   HDL 47 (L) 11/23/2016   Lab Results   Component Value Date   LDLCALC 151 (H) 11/23/2016   Lab Results  Component Value Date   TRIG 92 11/23/2016   Lab Results  Component Value Date   CHOLHDL 4.6 11/23/2016   Lab Results  Component Value Date   HGBA1C 6.4 (A) 11/11/2019       Assessment & Plan:   Problem List Items Addressed This Visit    None    Visit Diagnoses    Hx of gastric ulcer    -  Primary   Instructed patient on the potential worsening of gastric ulcers with the use of NSAIDs.  Refill requested.  Encourage patient to consider Duexis in the future   Nausea       Labs pending Ondansetron 8 mg ODT prescribed   Relevant Orders   Amylase (Completed)   Lipase (Completed)   H. pylori breath test   Slow transit constipation       Referral to gastroenterology.  Patient requested appointment with GI.  Due to her age and history is appropriate   Relevant Orders   TSH (Completed)   Ambulatory referral to Gastroenterology   Bad odor of urine       Urinalysis negative Urine culture pending per patient request   Relevant Orders   Urine Culture   POCT Urinalysis Dipstick (Completed)       Meds ordered this encounter  Medications  . ibuprofen (ADVIL) 600 MG tablet    Sig: Take 1 tablet (600 mg total) by mouth every 8 (eight) hours as needed for moderate pain.    Dispense:  90 tablet    Refill:  2    Order Specific Question:   Supervising Provider    Answer:   Tresa Garter [id number]  . FLUoxetine (PROZAC) 40 MG capsule    Sig: Take 1 capsule (40 mg total) by mouth every morning.    Dispense:  30 capsule    Refill:  2    Order Specific Question:   Supervising Provider    Answer:   Tresa Garter [id number]  . docusate sodium (COLACE) 100 MG capsule    Sig: Take 1 capsule (100 mg total) by mouth 2 (two) times daily. Do not use longer than 7 days.    Dispense:  60 capsule    Refill:  2    Do not place this medication, or any other prescription from our practice, on "Automatic Refill".  Patient may have prescription filled one day early if pharmacy is closed on scheduled refill date.    Order Specific Question:   Supervising Provider    Answer:   Tresa Garter [id number]  . ondansetron (ZOFRAN-ODT) 8 MG disintegrating tablet    Sig: Take 1 tablet (8 mg total) by mouth 2 (two) times daily for 15 days.    Dispense:  60 tablet    Refill:  0    Do not add to the  electronic "Automatic Refill" notification system. Patient may have prescription filled one day early if pharmacy is closed on scheduled refill date.    Order Specific Question:   Supervising Provider    Answer:   Tresa Garter [id number]     [name], NP

## 2019-12-24 NOTE — Patient Instructions (Signed)
Constipation, Adult Constipation is when a person:  Poops (has a bowel movement) fewer times in a week than normal.  Has a hard time pooping.  Has poop that is dry, hard, or bigger than normal. Follow these instructions at home: Eating and drinking   Eat foods that have a lot of fiber, such as: ? Fresh fruits and vegetables. ? Whole grains. ? Beans.  Eat less of foods that are high in fat, low in fiber, or overly processed, such as: ? [place] fries. ? Hamburgers. ? Cookies. ? Candy. ? Soda.  Drink enough fluid to keep your pee (urine) clear or pale yellow. General instructions  Exercise regularly or as told by your doctor.  Go to the restroom when you feel like you need to poop. Do not hold it in.  Take over-the-counter and prescription medicines only as told by your doctor. These include any fiber supplements.  Do pelvic floor retraining exercises, such as: ? Doing deep breathing while relaxing your lower belly (abdomen). ? Relaxing your pelvic floor while pooping.  Watch your condition for any changes.  Keep all follow-up visits as told by your doctor. This is important. Contact a doctor if:  You have pain that gets worse.  You have a fever.  You have not pooped for 4 days.  You throw up (vomit).  You are not hungry.  You lose weight.  You are bleeding from the anus.  You have thin, pencil-like poop (stool). Get help right away if:  You have a fever, and your symptoms suddenly get worse.  You leak poop or have blood in your poop.  Your belly feels hard or bigger than normal (is bloated).  You have very bad belly pain.  You feel dizzy or you faint. This information is not intended to replace advice given to you by your health care provider. Make sure you discuss any questions you have with your health care provider. Document Revised: 07/20/2017 Document Reviewed: 01/26/2016 Elsevier Patient Education  [place]. Nausea, Adult Nausea is  feeling sick to your stomach or feeling that you are about to throw up (vomit). Feeling sick to your stomach is usually not serious, but it may be an early sign of a more serious medical problem. As you feel sicker to your stomach, you may throw up. If you throw up, or if you are not able to drink enough fluids, there is a risk that you may lose too much water in your body (get dehydrated). If you lose too much water in your body, you may:  Feel tired.  Feel thirsty.  Have a dry mouth.  Have cracked lips.  Go pee (urinate) less often. Older adults and people who have other diseases or a weak body defense system (immune system) have a higher risk of losing too much water in the body. The main goals of treating this condition are:  To relieve your nausea.  To ensure your nausea occurs less often.  To prevent throwing up and losing too much fluid. Follow these instructions at home: Watch your symptoms for any changes. Tell your doctor about them. Follow these instructions as told by your doctor. Eating and drinking      Take an ORS (oral rehydration solution). This is a drink that is sold at pharmacies and stores.  Drink clear fluids in small amounts as you are able. These include: ? Water. ? Ice chips. ? Fruit juice that has water added (diluted fruit juice). ? Low-calorie sports drinks.  Eat bland, easy-to-digest foods in small amounts as you are able, such as: ? Bananas. ? Applesauce. ? Rice. ? Low-fat (lean) meats. ? Toast. ? Crackers.  Avoid drinking fluids that have a lot of sugar or caffeine in them. This includes energy drinks, sports drinks, and soda.  Avoid alcohol.  Avoid spicy or fatty foods. General instructions  Take over-the-counter and prescription medicines only as told by your doctor.  Rest at home while you get better.  Drink enough fluid to keep your pee (urine) pale yellow.  Take slow and deep breaths when you feel sick to your  stomach.  Avoid food or things that have strong smells.  Wash your hands often with soap and water. If you cannot use soap and water, use hand sanitizer.  Make sure that all people in your home wash their hands well and often.  Keep all follow-up visits as told by your doctor. This is important. Contact a doctor if:  You feel sicker to your stomach.  You feel sick to your stomach for more than 2 days.  You throw up.  You are not able to drink fluids without throwing up.  You have new symptoms.  You have a fever.  You have a headache.  You have muscle cramps.  You have a rash.  You have pain while peeing.  You feel light-headed or dizzy. Get help right away if:  You have pain in your chest, neck, arm, or jaw.  You feel very weak or you pass out (faint).  You have throw up that is bright red or looks like coffee grounds.  You have bloody or black poop (stools) or poop that looks like tar.  You have a very bad headache, a stiff neck, or both.  You have very bad pain, cramping, or bloating in your belly (abdomen).  You have trouble breathing or you are breathing very quickly.  Your heart is beating very quickly.  Your skin feels cold and clammy.  You feel confused.  You have signs of losing too much water in your body, such as: ? Dark pee, very little pee, or no pee. ? Cracked lips. ? Dry mouth. ? Sunken eyes. ? Sleepiness. ? Weakness. These symptoms may be an emergency. Do not wait to see if the symptoms will go away. Get medical help right away. Call your local emergency services (911 in the [place] Do not drive yourself to the hospital. Summary  Nausea is feeling sick to your stomach or feeling that you are about to throw up (vomit).  If you throw up, or if you are not able to drink enough fluids, there is a risk that you may lose too much water in your body (get dehydrated).  Eat and drink what your doctor tells you. Take over-the-counter and  prescription medicines only as told by your doctor.  Contact a doctor right away if your symptoms get worse or you have new symptoms.  Keep all follow-up visits as told by your doctor. This is important. This information is not intended to replace advice given to you by your health care provider. Make sure you discuss any questions you have with your health care provider. Document Revised: 01/15/2018 Document Reviewed: 01/15/2018 Elsevier Patient Education  [place].

## 2019-12-24 NOTE — Telephone Encounter (Signed)
Pt has requested refills

## 2019-12-25 LAB — AMYLASE: Amylase: 37 U/L (ref 31–110)

## 2019-12-25 LAB — LIPASE: Lipase: 22 U/L (ref 14–72)

## 2019-12-25 LAB — TSH: TSH: 1.61 u[IU]/mL (ref 0.450–4.500)

## 2019-12-26 ENCOUNTER — Other Ambulatory Visit: Payer: Self-pay | Admitting: Nurse Practitioner

## 2019-12-26 LAB — URINE CULTURE

## 2019-12-26 LAB — H. PYLORI BREATH TEST: H pylori Breath Test: NEGATIVE

## 2019-12-26 MED ORDER — NITROFURANTOIN MONOHYD MACRO 100 MG PO CAPS: 100.0000 mg | ORAL_CAPSULE | Freq: Two times a day (BID) | ORAL | 0 refills | Status: AC

## 2020-01-05 ENCOUNTER — Ambulatory Visit (INDEPENDENT_AMBULATORY_CARE_PROVIDER_SITE_OTHER): Payer: Medicaid Other | Admitting: Obstetrics & Gynecology

## 2020-01-05 ENCOUNTER — Encounter: Payer: Self-pay | Admitting: Obstetrics & Gynecology

## 2020-01-05 ENCOUNTER — Telehealth: Payer: Self-pay

## 2020-01-05 VITALS — BP 125/83 | HR 93 | Wt 198.0 lb

## 2020-01-05 DIAGNOSIS — Z9889 Other specified postprocedural states: Secondary | ICD-10-CM

## 2020-01-05 DIAGNOSIS — Z9079 Acquired absence of other genital organ(s): Secondary | ICD-10-CM

## 2020-01-05 NOTE — Telephone Encounter (Signed)
Per Leah Barber, the [facility] will call pt to schedule an appt with Dr. Kennon Rounds in August.  Isiaha Greenup l Tynia Wiers, CMA

## 2020-01-05 NOTE — Progress Notes (Signed)
History:  44 y.o.LMP here today for 6 week post op check.Pt is s/p RATH with bilateral salpingectomy on 11/25/2019.  Pt reports that she is doing well. She is eating and passing stols without difficulty.   She is not working presently and is not currently sexually active.   The following portions of the patient's history were reviewed and updated as appropriate: allergies, current medications, past family history, past medical history, past social history, past surgical history and problem list.  Review of Systems:  Pertinent items are noted in HPI.    Objective:  Physical Exam BP 125/83   Pulse 93   Wt 198 lb (89.8 kg)   LMP 09/14/2019 (Approximate)   BMI 36.21 kg/m   CONSTITUTIONAL: Well-developed, well-nourished female in no acute distress.  HENT:  Normocephalic, atraumatic EYES: Conjunctivae and EOM are normal. No scleral icterus.  NECK: Normal range of motion SKIN: Skin is warm and dry. No rash noted. Not diaphoretic.No pallor. [place]: Alert and oriented to person, place, and time. Normal coordination.  Abd: Soft, nontender and nondistended; her port sites are healing well. .  Pelvic: deferred  Labs and Imaging Surg path 11/25/2019 UTERUS, CERVIX, BILATERAL FALLOPIAN TUBES, HYSTERECTOMY WITH  SALPINGECTOMY:  - Uterus with leiomyomata, largest measuring 7 cm  - Benign inactive endometrium  - Adenomyosis  - Benign unremarkable cervix  - Segments of benign unremarkable bilateral fallopian tubes  - No evidence of malignancy    Assessment & Plan:  6 week post op check following RATH with bilateral salpingectomy.   Doing well  Reviewed her surg path.   Reviewed post op instructions and activities  Gradual increase in activities  Will transfer pt back to Dr. Kennon Rounds. She needs a 3 month follow up.   Reviewed no intercourse for 8 weeks post op  All questions answered.   Aggie Douse L. Harraway-Smith, M.D., Cherlynn June

## 2020-01-06 ENCOUNTER — Ambulatory Visit: Payer: Medicaid Other | Admitting: Family Medicine

## 2020-01-06 ENCOUNTER — Ambulatory Visit
Admission: RE | Admit: 2020-01-06 | Discharge: 2020-01-06 | Disposition: A | Discharge status: Home / Self Care | Payer: Medicaid Other | Source: Ambulatory Visit | Attending: Neurology | Admitting: Neurology

## 2020-01-06 ENCOUNTER — Other Ambulatory Visit: Payer: Self-pay

## 2020-01-06 DIAGNOSIS — M79602 Pain in left arm: Secondary | ICD-10-CM

## 2020-01-08 ENCOUNTER — Telehealth: Payer: Self-pay | Admitting: Neurology

## 2020-01-08 NOTE — Telephone Encounter (Signed)
  I called the patient.  MRI of the cervical spine is normal.  No indication for surgery.  The patient does have some blood sugar issues, the changes on EMG with the paraspinal muscles could be related to this issue.  No explanation for the neck pain or arm discomfort.  MRI cervical 01/08/20:  IMPRESSION:   Unremarkable MRI cervical spine (without). No spinal stenosis or foraminal narrowing.

## 2020-01-09 ENCOUNTER — Ambulatory Visit: Payer: Medicaid Other | Admitting: Nurse Practitioner

## 2020-01-13 ENCOUNTER — Encounter: Payer: Self-pay | Admitting: Nurse Practitioner

## 2020-01-13 ENCOUNTER — Ambulatory Visit: Payer: Medicaid Other | Admitting: Nurse Practitioner

## 2020-01-13 VITALS — BP 117/72 | HR 81 | Ht 62.0 in | Wt 200.2 lb

## 2020-01-13 DIAGNOSIS — R110 Nausea: Secondary | ICD-10-CM | POA: Diagnosis not present

## 2020-01-13 DIAGNOSIS — Z1211 Encounter for screening for malignant neoplasm of colon: Secondary | ICD-10-CM

## 2020-01-13 DIAGNOSIS — K219 Gastro-esophageal reflux disease without esophagitis: Secondary | ICD-10-CM

## 2020-01-13 NOTE — Progress Notes (Signed)
ASSESSMENT / PLAN:   44 year old female with PMH significant for anxiety, depression, asthma, diabetes, obesity, hyperlipidemia, hypertension, fibromyalgia, GERD,  idiopathic intracranial hemorrhage and pseudotumor cerebri December 2020, glaucoma, history and dermoid tumor of left ovary, C-section x3, and partial hysterectomy.    # GERD --Heartburn, regurgitation and hoarse voice refractory to daily PPI. Marland Kitchen  Symptoms can be particularly bad at night . --Antireflux measures discussed. --Continue omeprazole 30 minutes before breakfast.  Will add Pepcid OTC at bedtime --For further evaluation of refractory GERD symptoms will schedule patient for EGD. The risks and benefits of EGD were discussed and the patient agrees to proceed.   # Nausea with waterbrash and diaphoresis associated with defecation --Etiology unclear, ? vasovagal response --Further evaluation at time of EGD  # Bowel changes / constipation --Exacerbated by the Zofran for nausea --Okay to continue daily Colace  --Add 1 capful of MiraLAX in 8 ounces of water daily  --For further evaluation of bowel changes and also colon cancer screening patient will be scheduled for colonoscopy. Patient will be scheduled for a colonoscopy. The risks and benefits of colonoscopy with possible polypectomy / biopsies were discussed and the patient agrees to proceed.    HPI:     Chief Complaint: Nausea , bowel changes  Leah Barber is a 44 year old female referred by PCP for evaluation of nausea.  Patient typically has a normal daily bowel movement. Six weeks she began having nausea and sweating associated with defecation.  She has experienced water brash but no actual vomiting. Symptoms only present during defecation.  She doesn't feel like the nausea / sweating are associated with constipation nor straining.   Patient says she wasn't having any associated constipation before starting Zofran on 12/24/19 but now stools are small and  flat and require her to strain. However, some bowel changes must have been present prior to Zofran as PCP note on 12/24/19 mentions a month's worth of constipation.  Taking one Colace a day.   Patient has also been having heartburn , regurgitation, and hoarse voice despite daily omeprazole.  Regurgitation is particularly bothersome at night.  She has been sleeping on the couch propped up with pillows.   She has only 1 cup of coffee every day.   Data Reviewed:   10/26/19  ED for chest pain.  Liver test normal.  Cr normal.  CT angiogram chest - Rule out PE. Negative study.   11/25/19 CBC normal  12/24/2019 Amylase and lipase normal   Past Medical History:  Diagnosis Date  . Anemia   . Anxiety   . Asthma   . Chronic lower back pain   . Depression   . Diabetes mellitus without complication ([place])   . Dyspnea   . Hyperlipemia   . Hypertension   . IIH (idiopathic intracranial hypertension)   . Pseudotumor cerebri 08/13/2019    Past Surgical History:  Procedure Laterality Date  . CESAREAN SECTION     x 3  . CYSTOSCOPY Bilateral 11/25/2019   Procedure: CYSTOSCOPY;  Surgeon: Lavonia Drafts, MD;  Location: [facility];  Service: Gynecology;  Laterality: Bilateral;  . DERMOID CYST  EXCISION    . fluid removed from brain  12/20/2018  . ROBOTIC ASSISTED TOTAL HYSTERECTOMY Bilateral 11/25/2019   Procedure: XI ROBOTIC ASSISTED TOTAL HYSTERECTOMY WITH SALPINGECTOMY;  Surgeon: Lavonia Drafts, MD;  Location: [facility];  Service: Gynecology;  Laterality: Bilateral;   Family History  Problem Relation Age of Onset  . Diabetes Mother   . Hypertension Mother   . Cancer Father   . Liver cancer Father   . Colon cancer Neg Hx   . Esophageal cancer Neg Hx   . Rectal cancer Neg Hx   . Stomach cancer Neg Hx   . Pancreatic cancer Neg Hx    Social History   Tobacco Use  . Smoking status: Current Every Day Smoker    Packs/day: 1.00    Types:  Cigarettes  . Smokeless tobacco: Never Used  Substance Use Topics  . Alcohol use: No  . Drug use: No   Current Outpatient Medications  Medication Sig Dispense Refill  . albuterol (VENTOLIN HFA) 108 (90 Base) MCG/ACT inhaler Inhale 2 puffs into the lungs every 4 (four) hours as needed for wheezing or shortness of breath. 6.7 g 2  . amLODipine (NORVASC) 10 MG tablet Take 1 tablet (10 mg total) by mouth daily. 90 tablet 1  . baclofen (LIORESAL) 10 MG tablet Take 1 tablet (10 mg total) by mouth 3 (three) times daily. 90 each 1  . Buprenorphine HCl-Naloxone HCl 8-2 MG FILM Place 2 Film under the tongue daily.     . cetirizine (ZYRTEC) 10 MG tablet Take 1 tablet (10 mg total) by mouth daily. 30 tablet 11  . cloNIDine (CATAPRES) 0.1 MG tablet TAKE 1 TABLET BY MOUTH THREE TIMES A DAY 60 tablet 1  . CONCERTA 36 MG CR tablet Take 36 mg by mouth every morning.    . docusate sodium (COLACE) 100 MG capsule Take 1 capsule (100 mg total) by mouth 2 (two) times daily. Do not use longer than 7 days. 60 capsule 2  . DULoxetine (CYMBALTA) 60 MG capsule TAKE 1 CAPSULE (60 MG TOTAL) BY MOUTH 2 (TWO) TIMES DAILY. 180 capsule 2  . FLUoxetine (PROZAC) 40 MG capsule Take 1 capsule (40 mg total) by mouth every morning. 30 capsule 2  . fluticasone (FLONASE) 50 MCG/ACT nasal spray INHALE 2 SPRAYS IN BOTH NOSTRILS DAILY 16 mL 2  . furosemide (LASIX) 20 MG tablet Take 1 tablet (20 mg total) by mouth daily. 90 tablet 1  . gabapentin (NEURONTIN) 400 MG capsule Take 2 capsules (800 mg total) by mouth 3 (three) times daily. 90 capsule 5  . ibuprofen (ADVIL) 600 MG tablet Take 1 tablet (600 mg total) by mouth every 8 (eight) hours as needed for moderate pain. 90 tablet 2  . latanoprost (XALATAN) 0.005 % ophthalmic solution Place 1 drop into both eyes at bedtime. 2.5 mL 0  . metFORMIN (GLUCOPHAGE) 500 MG tablet Take 1 tablet (500 mg total) by mouth 2 (two) times daily with a meal. 180 tablet 1  . Multiple Vitamins-Minerals  (WOMENS MULTIVITAMIN PO) Take 1 tablet by mouth daily.     Marland Kitchen omeprazole (PRILOSEC) 40 MG capsule Take 1 capsule (40 mg total) by mouth daily. 90 capsule 1  . ondansetron (ZOFRAN) 4 MG tablet Take 1 tablet (4 mg total) by mouth every 6 (six) hours as needed for nausea or vomiting. 60 tablet 1  . Spacer/Aero-Holding Chambers (AEROCHAMBER PLUS WITH MASK) inhaler Use as instructed 1 each 2   No current facility-administered medications for this visit.   Allergies  Allergen Reactions  . Clindamycin/Lincomycin     Nausea   . Other     REFUSED BLOOD TRANSFUSION AND BLOOD PRODUCTS  . Hydrocodone Nausea And Vomiting    "stomach pain" "stomach pain"  . Morphine Other (See Comments) and  Nausea Only     Review of Systems: Positive for allergy, sinus trouble, anxiety, arthritis, back pain, vision changes, headaches, shortness of breath, sleeping problems, swelling of feet and legs, excessive thirst and excessive urination.  All other systems reviewed and negative except where noted in HPI.    Creatinine clearance cannot be calculated (Patient's most recent lab result is older than the maximum 21 days allowed.)   Physical Exam:    Wt Readings from Last 3 Encounters:  01/13/20 200 lb 4 oz (90.8 kg)  01/05/20 198 lb (89.8 kg)  12/24/19 200 lb 9.6 oz (91 kg)    BP 117/72   Pulse 81   Ht 5\' 2"  (1.575 m)   Wt 200 lb 4 oz (90.8 kg)   LMP 09/14/2019 (Approximate)   BMI 36.63 kg/m  Constitutional:  Pleasant female in no acute distress. Psychiatric: Normal mood and affect. Behavior is normal. EENT: Pupils normal.  Conjunctivae are normal. No scleral icterus. Neck supple.  Cardiovascular: Normal rate, regular rhythm. No edema Pulmonary/chest: Effort normal and breath sounds normal. No wheezing, rales or rhonchi. Abdominal: Soft, nondistended, nontender. Bowel sounds active throughout. There are no masses palpable. No hepatomegaly. Neurological: Alert and oriented to person place and  time. Skin: Skin is warm and dry. No rashes noted.  Tye Savoy, NP  01/13/2020, 3:46 PM  Cc:  Referring Provider Dionisio David, NP

## 2020-01-13 NOTE — Patient Instructions (Addendum)
You have been scheduled for an endoscopy and colonoscopy. Please follow the written instructions given to you at your visit today. Please pick up your prep supplies at the pharmacy within the next 1-3 days. If you use inhalers (even only as needed), please bring them with you on the day of your procedure.   Please take Miralax daily.   Continue your omeprazole daily 30-60 minutes before breakfast and add over the counter Pepcid 20mg  daily at bedtime.   I appreciate the opportunity to care for you. Tye Savoy, NP-C

## 2020-01-14 NOTE — Progress Notes (Signed)
____________________________________________________________  Attending physician addendum:  Thank you for sending this case to me. I have reviewed the entire note, and the outlined plan seems appropriate.  Also consider that one or a combination of her multiple medicines may be contributing to her symptoms.  Wilfrid Lund, MD  ____________________________________________________________

## 2020-01-21 ENCOUNTER — Other Ambulatory Visit: Payer: Self-pay | Admitting: Gastroenterology

## 2020-01-21 ENCOUNTER — Ambulatory Visit (INDEPENDENT_AMBULATORY_CARE_PROVIDER_SITE_OTHER): Payer: Medicaid Other

## 2020-01-21 DIAGNOSIS — Z1159 Encounter for screening for other viral diseases: Secondary | ICD-10-CM

## 2020-01-22 ENCOUNTER — Telehealth: Payer: Self-pay | Admitting: *Deleted

## 2020-01-22 ENCOUNTER — Emergency Department (HOSPITAL_COMMUNITY): Payer: Medicaid Other

## 2020-01-22 ENCOUNTER — Other Ambulatory Visit: Payer: Self-pay

## 2020-01-22 ENCOUNTER — Emergency Department (HOSPITAL_COMMUNITY)
Admission: EM | Admit: 2020-01-22 | Discharge: 2020-01-22 | Disposition: A | Discharge status: Home / Self Care | Payer: Medicaid Other | Attending: Emergency Medicine | Admitting: Emergency Medicine

## 2020-01-22 ENCOUNTER — Telehealth: Payer: Self-pay | Admitting: Nurse Practitioner

## 2020-01-22 ENCOUNTER — Encounter (HOSPITAL_COMMUNITY): Payer: Self-pay | Admitting: Emergency Medicine

## 2020-01-22 DIAGNOSIS — Z79899 Other long term (current) drug therapy: Secondary | ICD-10-CM | POA: Diagnosis not present

## 2020-01-22 DIAGNOSIS — R110 Nausea: Secondary | ICD-10-CM

## 2020-01-22 DIAGNOSIS — R109 Unspecified abdominal pain: Secondary | ICD-10-CM | POA: Insufficient documentation

## 2020-01-22 DIAGNOSIS — R079 Chest pain, unspecified: Secondary | ICD-10-CM

## 2020-01-22 DIAGNOSIS — I10 Essential (primary) hypertension: Secondary | ICD-10-CM | POA: Insufficient documentation

## 2020-01-22 DIAGNOSIS — R0602 Shortness of breath: Secondary | ICD-10-CM | POA: Diagnosis not present

## 2020-01-22 DIAGNOSIS — R112 Nausea with vomiting, unspecified: Secondary | ICD-10-CM | POA: Insufficient documentation

## 2020-01-22 DIAGNOSIS — R911 Solitary pulmonary nodule: Secondary | ICD-10-CM

## 2020-01-22 DIAGNOSIS — E119 Type 2 diabetes mellitus without complications: Secondary | ICD-10-CM | POA: Insufficient documentation

## 2020-01-22 DIAGNOSIS — Z7984 Long term (current) use of oral hypoglycemic drugs: Secondary | ICD-10-CM | POA: Diagnosis not present

## 2020-01-22 DIAGNOSIS — F17210 Nicotine dependence, cigarettes, uncomplicated: Secondary | ICD-10-CM | POA: Diagnosis not present

## 2020-01-22 DIAGNOSIS — J45909 Unspecified asthma, uncomplicated: Secondary | ICD-10-CM | POA: Diagnosis not present

## 2020-01-22 LAB — LIPASE, BLOOD: Lipase: 44 U/L (ref 11–51)

## 2020-01-22 LAB — COMPREHENSIVE METABOLIC PANEL
ALT: 47 U/L — ABNORMAL HIGH (ref 0–44)
AST: 41 U/L (ref 15–41)
Albumin: 4 g/dL (ref 3.5–5.0)
Alkaline Phosphatase: 108 U/L (ref 38–126)
Anion gap: 9 (ref 5–15)
BUN: 13 mg/dL (ref 6–20)
CO2: 28 mmol/L (ref 22–32)
Calcium: 8.8 mg/dL — ABNORMAL LOW (ref 8.9–10.3)
Chloride: 101 mmol/L (ref 98–111)
Creatinine, Ser: 0.61 mg/dL (ref 0.44–1.00)
GFR calc Af Amer: >60 mL/min (ref >60)
GFR calc non Af Amer: >60 mL/min (ref >60)
Glucose, Bld: 145 mg/dL — ABNORMAL HIGH (ref 70–99)
Potassium: 3.6 mmol/L (ref 3.5–5.1)
Sodium: 138 mmol/L (ref 135–145)
Total Bilirubin: 0.7 mg/dL (ref 0.3–1.2)
Total Protein: 7.2 g/dL (ref 6.5–8.1)

## 2020-01-22 LAB — CBC WITH DIFFERENTIAL/PLATELET
Abs Immature Granulocytes: 0.05 10*3/uL (ref 0.00–0.07)
Basophils Absolute: 0 10*3/uL (ref 0.0–0.1)
Basophils Relative: 0 %
Eosinophils Absolute: 0.2 10*3/uL (ref 0.0–0.5)
Eosinophils Relative: 2 %
HCT: 45.4 % (ref 36.0–46.0)
Hemoglobin: 14.6 g/dL (ref 12.0–15.0)
Immature Granulocytes: 0 %
Lymphocytes Relative: 6 %
Lymphs Abs: 0.9 10*3/uL (ref 0.7–4.0)
MCH: 30.2 pg (ref 26.0–34.0)
MCHC: 32.2 g/dL (ref 30.0–36.0)
MCV: 94 fL (ref 80.0–100.0)
Monocytes Absolute: 0.8 10*3/uL (ref 0.1–1.0)
Monocytes Relative: 6 %
Neutro Abs: 12.2 10*3/uL — ABNORMAL HIGH (ref 1.7–7.7)
Neutrophils Relative %: 86 %
Platelets: 210 10*3/uL (ref 150–400)
RBC: 4.83 MIL/uL (ref 3.87–5.11)
RDW: 11.4 % — ABNORMAL LOW (ref 11.5–15.5)
WBC: 14.2 10*3/uL — ABNORMAL HIGH (ref 4.0–10.5)
nRBC: 0 % (ref 0.0–0.2)

## 2020-01-22 LAB — I-STAT BETA HCG BLOOD, ED (MC, WL, AP ONLY): I-stat hCG, quantitative: <5 m[IU]/mL (ref <5)

## 2020-01-22 LAB — TROPONIN I (HIGH SENSITIVITY): Troponin I (High Sensitivity): <2 ng/L (ref <18)

## 2020-01-22 LAB — D-DIMER, QUANTITATIVE: D-Dimer, Quant: 1.22 ug/mL-FEU — ABNORMAL HIGH (ref 0.00–0.50)

## 2020-01-22 LAB — SARS CORONAVIRUS 2 (TAT 6-24 HRS): SARS Coronavirus 2: NEGATIVE

## 2020-01-22 MED ORDER — SODIUM CHLORIDE 0.9 % IV BOLUS
1000.0000 mL | Freq: Once | INTRAVENOUS | Status: AC
  Administered 2020-01-22: 1000 mL via INTRAVENOUS

## 2020-01-22 MED ORDER — ONDANSETRON HCL 4 MG/2ML IJ SOLN
4.0000 mg | Freq: Once | INTRAMUSCULAR | Status: AC
  Administered 2020-01-22: 4 mg via INTRAVENOUS
  Filled 2020-01-22: qty 2

## 2020-01-22 MED ORDER — DULOXETINE HCL 30 MG PO CPEP
60.0000 mg | ORAL_CAPSULE | Freq: Once | ORAL | Status: AC
  Administered 2020-01-22: 60 mg via ORAL
  Filled 2020-01-22: qty 2

## 2020-01-22 MED ORDER — SODIUM CHLORIDE (PF) 0.9 % IJ SOLN
INTRAMUSCULAR | Status: AC
  Filled 2020-01-22: qty 50

## 2020-01-22 MED ORDER — CLONIDINE HCL 0.1 MG PO TABS
0.1000 mg | ORAL_TABLET | Freq: Three times a day (TID) | ORAL | Status: DC
  Administered 2020-01-22: 0.1 mg via ORAL
  Filled 2020-01-22 (×2): qty 1

## 2020-01-22 MED ORDER — IBUPROFEN 200 MG PO TABS
600.0000 mg | ORAL_TABLET | Freq: Three times a day (TID) | ORAL | Status: DC | PRN
  Administered 2020-01-22 (×2): 600 mg via ORAL
  Filled 2020-01-22 (×2): qty 3

## 2020-01-22 MED ORDER — IOHEXOL 350 MG/ML SOLN
100.0000 mL | Freq: Once | INTRAVENOUS | Status: AC | PRN
  Administered 2020-01-22: 100 mL via INTRAVENOUS

## 2020-01-22 MED ORDER — GABAPENTIN 400 MG PO CAPS
400.0000 mg | ORAL_CAPSULE | Freq: Three times a day (TID) | ORAL | Status: DC
  Administered 2020-01-22: 400 mg via ORAL
  Filled 2020-01-22: qty 1

## 2020-01-22 MED ORDER — FENTANYL CITRATE (PF) 100 MCG/2ML IJ SOLN
50.0000 ug | Freq: Once | INTRAMUSCULAR | Status: AC
  Administered 2020-01-22: 50 ug via INTRAVENOUS
  Filled 2020-01-22: qty 2

## 2020-01-22 NOTE — ED Notes (Signed)
Per radiology, patient's IV is not large enough for CT. IV team order placed .

## 2020-01-22 NOTE — ED Notes (Signed)
Phlebotomy at bedside.

## 2020-01-22 NOTE — ED Notes (Signed)
Patient transported to radiology

## 2020-01-22 NOTE — ED Notes (Signed)
Unsuccessful lab draw x2. IV will not pull back blood. Phlebotomy called to obtain labs.

## 2020-01-22 NOTE — Discharge Instructions (Signed)
Regarding her symptoms today, please keep the appointment with your gastroenterologist for the endoscopy and colonoscopy as originally scheduled for tomorrow.  Recommend follow-up as well with your primary care doctor.  Regarding the lung nodule, the radiologist recommends having a repeat CT scan of your chest in 12 months.  If you develop worsening vomiting, fever, chest pain or difficulty breathing, return to ER for reassessment.

## 2020-01-22 NOTE — Telephone Encounter (Signed)
Patient is calling- she states that she is in the hospital. She states that she was continually throwing up and going to the bathroom. Patient has procedure tomorrow 6/4 and asking what we think she should do. If she should reschedule or keep appointment. She is not sure what the vomiting and diarrhea is coming from. States she is scared to do prep because of it.

## 2020-01-22 NOTE — ED Provider Notes (Signed)
[facility] DEPT Provider Note   CSN: [id number] Arrival date & time: 01/22/20  [id number]     History Chief Complaint  Patient presents with  . Emesis  . Chest Pain    Leah Barber is a 44 y.o. female.  Presents to ER with concern for abdominal pain, chest pain, noted: Shortness of breath.  Reports that she has been having ongoing intermittent nausea and abdominal pain for many weeks, has appointment with GI for endoscopy and colonoscopy tomorrow.  States today woke up with back pain, pain up to 7 out of 10 in severity sharp, stabbing in her chest, dull achy in her abdomen.  Not having much abdominal pain currently.  Is having severe nausea.  Nonbloody nonbilious emesis.  No constipation or diarrhea.  HPI     Past Medical History:  Diagnosis Date  . Anemia   . Anxiety   . Asthma   . Chronic lower back pain   . Depression   . Diabetes mellitus without complication ([place])   . Dyspnea   . Hyperlipemia   . Hypertension   . IIH (idiopathic intracranial hypertension)   . Pseudotumor cerebri 08/13/2019    Patient Active Problem List   Diagnosis Date Noted  . Abnormal uterine bleeding (AUB) 11/25/2019  . Post-operative state 11/25/2019  . Excessive daytime sleepiness 09/18/2019  . Hypersomnia with sleep apnea 09/18/2019  . Chronic pain syndrome 09/09/2019  . Pseudotumor cerebri 08/13/2019  . Nonintractable headache 07/07/2019  . History of idiopathic intracranial hypertension 07/07/2019  . Intertrigo 07/07/2019  . Environmental allergies 07/07/2019  . Fibroid 07/07/2019  . Chronic bronchitis ([place]) 07/02/2019  . Polysubstance dependence including opioid type drug with complication, episodic abuse, with unspecified complication ([place]) [id number]  . Depression 09/13/2015  . Cluster B personality disorder ([place]) 05/15/2014  . Dependence on nicotine from cigarettes 05/15/2014  . Substance induced mood disorder ([place]) 05/15/2014  . Benzodiazepine abuse  ([place]) 09/20/2011  . Cannabis abuse 05/04/2011  . Cocaine abuse ([place]) 05/04/2011  . Polysubstance dependence ([place]) 05/04/2011  . Candidiasis of vagina 04/30/2011  . Chronic pain in right shoulder 04/30/2011  . Depressive disorder, not elsewhere classified 04/30/2011  . Otitis media, acute 04/30/2011    Past Surgical History:  Procedure Laterality Date  . CESAREAN SECTION     x 3  . CYSTOSCOPY Bilateral 11/25/2019   Procedure: CYSTOSCOPY;  Surgeon: Lavonia Drafts, MD;  Location: [facility];  Service: Gynecology;  Laterality: Bilateral;  . DERMOID CYST  EXCISION    . fluid removed from brain  12/20/2018  . ROBOTIC ASSISTED TOTAL HYSTERECTOMY Bilateral 11/25/2019   Procedure: XI ROBOTIC ASSISTED TOTAL HYSTERECTOMY WITH SALPINGECTOMY;  Surgeon: Lavonia Drafts, MD;  Location: [facility];  Service: Gynecology;  Laterality: Bilateral;     OB History    Gravida  4   Para  3   Term  3   Preterm      AB  1   Living  3     SAB  1   TAB      Ectopic      Multiple      Live Births  3           Family History  Problem Relation Age of Onset  . Diabetes Mother   . Hypertension Mother   . Cancer Father   . Liver cancer Father   . Colon cancer Neg Hx   . Esophageal cancer Neg Hx   .  Rectal cancer Neg Hx   . Stomach cancer Neg Hx   . Pancreatic cancer Neg Hx     Social History   Tobacco Use  . Smoking status: Current Every Day Smoker    Packs/day: 1.00    Types: Cigarettes  . Smokeless tobacco: Never Used  Substance Use Topics  . Alcohol use: No  . Drug use: No    Home Medications Prior to Admission medications   Medication Sig Start Date End Date Taking? Authorizing Provider  albuterol (VENTOLIN HFA) 108 (90 Base) MCG/ACT inhaler Inhale 2 puffs into the lungs every 4 (four) hours as needed for wheezing or shortness of breath. 10/27/19  Yes Dorena Dew, FNP  amLODipine (NORVASC) 10 MG tablet Take 1 tablet  (10 mg total) by mouth daily. 11/13/19  Yes Dorena Dew, FNP  baclofen (LIORESAL) 10 MG tablet Take 1 tablet (10 mg total) by mouth 3 (three) times daily. Patient taking differently: Take 10 mg by mouth 3 (three) times daily as needed for muscle spasms.  06/12/19  Yes Dorena Dew, FNP  Buprenorphine HCl-Naloxone HCl 8-2 MG FILM Place 2 Film under the tongue daily.    Yes [provider]  cetirizine (ZYRTEC) 10 MG tablet Take 1 tablet (10 mg total) by mouth daily. 07/16/19  Yes Dorena Dew, FNP  cloNIDine (CATAPRES) 0.1 MG tablet TAKE 1 TABLET BY MOUTH THREE TIMES A DAY Patient taking differently: Take 0.1 mg by mouth 3 (three) times daily.  12/18/19  Yes Dorena Dew, FNP  CONCERTA 36 MG CR tablet Take 36 mg by mouth daily as needed (ADHD).  10/13/19  Yes [provider]  DULoxetine (CYMBALTA) 60 MG capsule TAKE 1 CAPSULE (60 MG TOTAL) BY MOUTH 2 (TWO) TIMES DAILY. 10/10/19  Yes Dorena Dew, FNP  FLUoxetine (PROZAC) 40 MG capsule Take 1 capsule (40 mg total) by mouth every morning. 12/24/19 03/23/20 Yes King, Diona Foley, NP  fluticasone (FLONASE) 50 MCG/ACT nasal spray INHALE 2 SPRAYS IN BOTH NOSTRILS DAILY Patient taking differently: Place 2 sprays into both nostrils daily.  11/27/19  Yes Dorena Dew, FNP  furosemide (LASIX) 20 MG tablet Take 1 tablet (20 mg total) by mouth daily. 11/13/19  Yes Dorena Dew, FNP  gabapentin (NEURONTIN) 400 MG capsule Take 2 capsules (800 mg total) by mouth 3 (three) times daily. Patient taking differently: Take 400-800 mg by mouth 3 (three) times daily.  11/13/19  Yes Dorena Dew, FNP  hydrOXYzine (VISTARIL) 50 MG capsule Take 50 mg by mouth 4 (four) times daily as needed for anxiety.  11/26/19  Yes [provider]  ibuprofen (ADVIL) 600 MG tablet Take 1 tablet (600 mg total) by mouth every 8 (eight) hours as needed for moderate pain. 12/24/19 01/23/20 Yes King, Diona Foley, NP  latanoprost (XALATAN) 0.005 %  ophthalmic solution Place 1 drop into both eyes at bedtime. 11/13/19  Yes Dorena Dew, FNP  metFORMIN (GLUCOPHAGE) 500 MG tablet Take 1 tablet (500 mg total) by mouth 2 (two) times daily with a meal. 11/13/19  Yes Dorena Dew, FNP  Multiple Vitamins-Minerals (WOMENS MULTIVITAMIN PO) Take 1 tablet by mouth daily.    Yes [provider]  omeprazole (PRILOSEC) 40 MG capsule Take 1 capsule (40 mg total) by mouth daily. 08/05/19  Yes Dorena Dew, FNP  ondansetron (ZOFRAN) 4 MG tablet Take 1 tablet (4 mg total) by mouth every 6 (six) hours as needed for nausea or vomiting. 12/09/19  Yes Dorena Dew, FNP  docusate sodium (COLACE) 100 MG capsule Take 1 capsule (100 mg total) by mouth 2 (two) times daily. Do not use longer than 7 days. Patient not taking: Reported on 01/22/2020 12/24/19 01/23/20  [name], NP  Spacer/Aero-Holding Josiah Lobo (AEROCHAMBER PLUS WITH MASK) inhaler Use as instructed 12/11/16   Muthersbaugh, Jarrett Soho, PA-C    Allergies    Clindamycin/lincomycin, Other, Hydrocodone, and Morphine  Review of Systems   Review of Systems  Constitutional: Negative for chills and fever.  HENT: Negative for ear pain and sore throat.   Eyes: Negative for pain and visual disturbance.  Respiratory: Positive for shortness of breath. Negative for cough.   Cardiovascular: Positive for chest pain. Negative for palpitations.  Gastrointestinal: Positive for abdominal pain, nausea and vomiting.  Genitourinary: Negative for dysuria and hematuria.  Musculoskeletal: Negative for arthralgias and back pain.  Skin: Negative for color change and rash.  Neurological: Negative for seizures and syncope.  All other systems reviewed and are negative.   Physical Exam Updated Vital Signs BP (!) 120/98   Pulse 80   Temp 98 F (36.7 C) (Oral)   Resp 17   Ht 5\' 2"  (1.575 m)   Wt 89.8 kg   LMP 09/14/2019 (Approximate)   SpO2 96%   BMI 36.21 kg/m   Physical Exam Vitals and nursing  note reviewed.  Constitutional:      General: She is not in acute distress.    Appearance: She is well-developed.  HENT:     Head: Normocephalic and atraumatic.  Eyes:     Conjunctiva/sclera: Conjunctivae normal.  Cardiovascular:     Rate and Rhythm: Normal rate and regular rhythm.     Heart sounds: No murmur.  Pulmonary:     Effort: Pulmonary effort is normal. No respiratory distress.     Breath sounds: Normal breath sounds.  Chest:     Chest wall: No mass or crepitus.  Abdominal:     General: Bowel sounds are normal.     Palpations: Abdomen is soft.     Tenderness: There is no abdominal tenderness.  Musculoskeletal:        General: Normal range of motion.     Cervical back: Neck supple.     Right lower leg: No edema.     Left lower leg: No edema.  Skin:    General: Skin is warm and dry.     Capillary Refill: Capillary refill takes less than 2 seconds.  Neurological:     General: No focal deficit present.     Mental Status: She is alert and oriented to person, place, and time.     ED Results / Procedures / Treatments   Labs (all labs ordered are listed, but only abnormal results are displayed) Labs Reviewed  CBC WITH DIFFERENTIAL/PLATELET - Abnormal; Notable for the following components:      Result Value   WBC 14.2 (*)    RDW 11.4 (*)    Neutro Abs 12.2 (*)    All other components within normal limits  COMPREHENSIVE METABOLIC PANEL - Abnormal; Notable for the following components:   Glucose, Bld 145 (*)    Calcium 8.8 (*)    ALT 47 (*)    All other components within normal limits  D-DIMER, QUANTITATIVE (NOT AT [facility]) - Abnormal; Notable for the following components:   D-Dimer, Quant 1.22 (*)    All other components within normal limits  LIPASE, BLOOD  I-STAT BETA HCG BLOOD, ED ([facility],  [facility], [facility] ONLY)  TROPONIN I (HIGH SENSITIVITY)    EKG EKG Interpretation  Date/Time:  Thursday January 22 2020 07:43:30 EDT Ventricular Rate:  78 PR Interval:    QRS  Duration: 87 QT Interval:  417 QTC Calculation: 475 R Axis:   -43 Text Interpretation: Sinus rhythm Consider right atrial enlargement Left axis deviation Confirmed by Madalyn Rob [phone]) on 01/22/2020 8:55:52 AM   Radiology DG Chest 2 View  Result Date: 01/22/2020 CLINICAL DATA:  Chest pain and shortness of breath. EXAM: CHEST - 2 VIEW COMPARISON:  10/26/2019 chest radiograph. FINDINGS: Hypoinflated lungs. Linear bilateral perihilar opacities reflect prominent vessels which are unchanged. Otherwise no focal airspace opacity. No pneumothorax or pleural effusion. Cardiomediastinal silhouette is unchanged. No acute osseous abnormalities. IMPRESSION: No acute airspace disease. Electronically Signed   By: Primitivo Gauze M.D.   On: 01/22/2020 10:17   CT Angio Chest PE W and/or Wo Contrast  Result Date: 01/22/2020 CLINICAL DATA:  Shortness of breath and elevated D-dimer. Clinical concern for pulmonary embolus. EXAM: CT ANGIOGRAPHY CHEST WITH CONTRAST TECHNIQUE: Multidetector CT imaging of the chest was performed using the standard protocol during bolus administration of intravenous contrast. Multiplanar CT image reconstructions and MIPs were obtained to evaluate the vascular anatomy. Performed in conjunction with CT of the abdomen/pelvis, reported separately. CONTRAST:  168mL OMNIPAQUE IOHEXOL 350 MG/ML SOLN COMPARISON:  Chest radiograph earlier this day. Chest CTA 10/26/2019 FINDINGS: Cardiovascular: There are no filling defects within the pulmonary arteries to suggest pulmonary embolus. Thoracic aorta is normal in caliber. Minor aortic atherosclerosis. No evidence of dissection. Heart is normal in size. No pericardial effusion. Mediastinum/Nodes: No enlarged mediastinal or hilar lymph nodes. No visualized thyroid nodule. Decompressed esophagus. Lungs/Pleura: Streaky and consolidative opacity in the dependent right lower lobe. Additional streaky opacities in the dependent left lower lobe. Upper lungs  are clear. Mild emphysema. No pulmonary edema. No pleural fluid. 5 mm pleural-based nodule in the right upper lobe, series 6, image 37, not definitively seen on prior. Upper Abdomen: Assessed on concurrent abdominal CT, reported separately. Musculoskeletal: There are no acute or suspicious osseous abnormalities. Review of the MIP images confirms the above findings. IMPRESSION: 1. No pulmonary embolus. 2. Streaky and consolidative opacity in the dependent right lower lobe, suspicious for pneumonia. Additional streaky opacities in the dependent left lower lobe may be atelectasis or pneumonia. 3. Mild emphysema. 4. Right upper lobe 5 mm pleural-based nodule, not definitively seen on prior chest CT. No follow-up needed if patient is low-risk. Non-contrast chest CT can be considered in 12 months if patient is high-risk. This recommendation follows the consensus statement: Guidelines for Management of Incidental Pulmonary Nodules Detected on CT Images: From the Fleischner Society 2017; Radiology 2017; 284:228-243. Aortic Atherosclerosis (ICD10-I70.0) and Emphysema (ICD10-J43.9). Electronically Signed   By: Keith Rake M.D.   On: 01/22/2020 13:53   CT ABDOMEN PELVIS W CONTRAST  Result Date: 01/22/2020 CLINICAL DATA:  Acute nonlocalized abdominal pain. Nausea. Also shortness of breath and elevated D-dimer. EXAM: CT ABDOMEN AND PELVIS WITH CONTRAST TECHNIQUE: Multidetector CT imaging of the abdomen and pelvis was performed using the standard protocol following bolus administration of intravenous contrast. Performed in conjunction with CTA of the chest, reported separately CONTRAST:  145mL OMNIPAQUE IOHEXOL 350 MG/ML SOLN COMPARISON:  Chest radiograph earlier this day. FINDINGS: Lower chest: Better assessed on concurrent chest CT, reported separately. Bilateral dependent streaky opacities. Hepatobiliary: Prominent liver spanning 20.7 cm cranial caudal. No focal abnormality. Gallbladder physiologically distended, no  calcified stone. No biliary  dilatation. Pancreas: No ductal dilatation or inflammation. Spleen: Normal in size without focal abnormality. Adrenals/Urinary Tract: Normal adrenal glands. 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 Left kidney is normally position. 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 No hydronephrosis or perinephric edema from either kidney. Homogeneous enhancement with symmetric excretion. Urinary bladder is partially distended and unremarkable. Stomach/Bowel: Stomach is unremarkable. Normal positioning of the duodenum and ligament of Treitz. No small bowel obstruction or inflammation. Normal appendix courses into the right upper quadrant. No terminal ileal inflammation. Colonic tortuosity. No colonic wall thickening or inflammation. Vascular/Lymphatic: Aorto bi-iliac atherosclerosis, mild but age advanced. No aneurysm. The portal vein is patent. No adenopathy. Reproductive: Post hysterectomy. Left ovary is normal. Right ovary is not definitively visualized. No suspicious adnexal mass. Other: No free air, free fluid, or intra-abdominal fluid collection. Musculoskeletal: Degenerative disc disease and facet hypertrophy at L5-S1. There are no acute or suspicious osseous abnormalities. IMPRESSION: 1. No acute abnormality in the abdomen/pelvis. 2. Incidental note of right kidney abnormally located in the pelvis, slightly malrotated. 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 3. Aorto bi-iliac atherosclerosis, mild but advanced for age. Aortic Atherosclerosis (ICD10-I70.0). Electronically Signed   By: Keith Rake M.D.   On: 01/22/2020 13:48    Procedures Procedures (including critical care time)  Medications Ordered in ED Medications  sodium chloride 0.9 % bolus 1,000 mL (0 mLs Intravenous Stopped 01/22/20 1041)  ondansetron (ZOFRAN) injection 4 mg (4 mg Intravenous Given 01/22/20 0852)  fentaNYL (SUBLIMAZE)  injection 50 mcg (50 mcg Intravenous Given 01/22/20 0853)  iohexol (OMNIPAQUE) 350 MG/ML injection 100 mL (100 mLs Intravenous Contrast Given 01/22/20 1306)  DULoxetine (CYMBALTA) DR capsule 60 mg (60 mg Oral Given 01/22/20 1343)    ED Course  I have reviewed the triage vital signs and the nursing notes.  Pertinent labs & imaging results that were available during my care of the patient were reviewed by me and considered in my medical decision making (see chart for details).  Clinical Course as of Jan 23 740  Thu Jan 22, 2020  1118 Rechecked, symptoms improved, pain and nausea resolved, dimer is elevated, will check CT chest and abd pelv   [RD]    Clinical Course User Index [RD] Lucrezia Starch, MD   MDM Rules/Calculators/A&P                      44 year old lady presented to ER with concern for nerve complaints including chest pain, shortness of breath, abdominal pain and vomiting.  Regarding the chest pain shortness of breath, ordered EKG, troponins, dimer, CXR to investigate for PE, ACS.  EKG without ischemic changes, troponin within normal limits, doubt ACS.  Dimer elevated, CTA chest however was negative for pulmonary embolism.  Did note small lung nodule, finding discussed with patient, she does have a smoking history, discussed need for CT in 12 months for monitoring per radiology recommendations.  CBC noted to have mild leukocytosis.  Given her GI symptoms, proceeded with CT abdomen pelvis, this was negative for acute process.  Patient reports having endoscopy/colonoscopy already scheduled with GI for tomorrow (6/4).  She is tolerating p.o. and remains well-appearing on reassessment.  Believe the patient can be discharged and can proceed with her originally scheduled GI appointment.  Reviewed return precautions and dc'd home.    After the discussed management above, the patient was determined to be safe for discharge.  The patient was in  agreement with this plan and all questions  regarding their care were answered.  ED return precautions were discussed and the patient will return to the ED with any significant worsening of condition.    Final Clinical Impression(s) / ED Diagnoses Final diagnoses:  Chest pain, unspecified type  Nausea  Lung nodule    Rx / DC Orders ED Discharge Orders    None       Lucrezia Starch, MD 01/23/20 [phone]

## 2020-01-22 NOTE — Telephone Encounter (Signed)
Pt called stating that she was in the hospital with nausea and vomiting for a few days and has no appetite. Shes scheduled for her endocolon with Dr. Loletha Carrow tomorrow. Patient was concerned that she would not tolerate the prep and wants to cancel her procedure and reschedule. Will notify Dr. Loletha Carrow.

## 2020-01-22 NOTE — ED Triage Notes (Signed)
Patient reports waking up at approx 3 AM and feeling sweaty, nauseous and weak. States she has had 5 episodes of vomiting. Reports lower centralized chest pain. Patient reports hx of diabetes type 2, hypertension, asthma and smokes 1 pack/day.

## 2020-01-23 ENCOUNTER — Encounter: Payer: Medicaid Other | Admitting: Gastroenterology

## 2020-01-23 NOTE — Telephone Encounter (Signed)
Understood, thanks

## 2020-01-26 ENCOUNTER — Other Ambulatory Visit: Payer: Self-pay | Admitting: Family Medicine

## 2020-01-26 DIAGNOSIS — R1030 Lower abdominal pain, unspecified: Secondary | ICD-10-CM

## 2020-01-28 ENCOUNTER — Other Ambulatory Visit: Payer: Self-pay | Admitting: Family Medicine

## 2020-01-28 ENCOUNTER — Ambulatory Visit: Payer: Medicaid Other | Admitting: Nurse Practitioner

## 2020-02-02 ENCOUNTER — Telehealth: Payer: Self-pay | Admitting: Gastroenterology

## 2020-02-02 ENCOUNTER — Ambulatory Visit: Payer: Medicaid Other | Admitting: Nurse Practitioner

## 2020-02-02 NOTE — Telephone Encounter (Signed)
Pls call pt. She called looking to r/s her endocolon but she was recently seen at the ED for chest pain so not sure if she needs to be seen first. Pt would also like to speak with a nurse because she states that she has been feeling very sick with sxs of nausea and vomiting for the past 3 months. She wants to have her procedure asap.

## 2020-02-02 NOTE — Telephone Encounter (Signed)
Pt was recently seen for chest pain in the ER, calling to reschedule her Endo/colon. Would you like to see her in office before getting procedure rescheduled? Please advise, thank you.

## 2020-02-04 NOTE — Telephone Encounter (Signed)
She can be booked for EGD without another office visit.  I plan just EGD first for two reasons:  1 - she has nausea and vomiting and may not be able to tolerate bowel prep well.  2 - ED nursing notes indicate it was difficult to get an IV.  If [facility] staff unable to get IV on her, do not want her prepped for colonoscopy.   If IV possible and EGD goes well, colonoscopy at a later date. If not, then procedures would need to be done at hospital (but currently booking into August for that)  - HD

## 2020-02-04 NOTE — Telephone Encounter (Signed)
Patient can be scheduled for a direct Endo, see notes below. Thank you.

## 2020-02-06 ENCOUNTER — Other Ambulatory Visit: Payer: Self-pay | Admitting: Family Medicine

## 2020-02-06 ENCOUNTER — Encounter: Payer: Self-pay | Admitting: Gastroenterology

## 2020-02-06 NOTE — Telephone Encounter (Signed)
Spoke with pt and she agreed with Dr. Loletha Carrow' plan. She is scheduled for direct EGD on 7/19 at 10:00am at [facility].

## 2020-02-12 ENCOUNTER — Other Ambulatory Visit: Payer: Self-pay | Admitting: *Deleted

## 2020-02-17 ENCOUNTER — Encounter: Payer: Self-pay | Admitting: Family Medicine

## 2020-02-17 ENCOUNTER — Ambulatory Visit (INDEPENDENT_AMBULATORY_CARE_PROVIDER_SITE_OTHER): Payer: Medicaid Other | Admitting: Family Medicine

## 2020-02-17 ENCOUNTER — Other Ambulatory Visit: Payer: Self-pay

## 2020-02-17 VITALS — BP 135/83 | HR 90 | Temp 97.2°F | Ht 62.0 in | Wt 206.0 lb

## 2020-02-17 DIAGNOSIS — E119 Type 2 diabetes mellitus without complications: Secondary | ICD-10-CM | POA: Diagnosis not present

## 2020-02-17 DIAGNOSIS — I10 Essential (primary) hypertension: Secondary | ICD-10-CM | POA: Diagnosis not present

## 2020-02-17 DIAGNOSIS — R7303 Prediabetes: Secondary | ICD-10-CM

## 2020-02-17 DIAGNOSIS — F1920 Other psychoactive substance dependence, uncomplicated: Secondary | ICD-10-CM | POA: Diagnosis not present

## 2020-02-17 DIAGNOSIS — B379 Candidiasis, unspecified: Secondary | ICD-10-CM

## 2020-02-17 DIAGNOSIS — J4520 Mild intermittent asthma, uncomplicated: Secondary | ICD-10-CM

## 2020-02-17 LAB — POCT GLYCOSYLATED HEMOGLOBIN (HGB A1C)
HbA1c POC (<> result, manual entry): 6.2 % (ref 4.0–5.6)
HbA1c, POC (controlled diabetic range): 6.2 % (ref 0.0–7.0)
HbA1c, POC (prediabetic range): 6.2 % (ref 5.7–6.4)
Hemoglobin A1C: 6.2 % — AB (ref 4.0–5.6)

## 2020-02-17 MED ORDER — FLUCONAZOLE 150 MG PO TABS: 150.0000 mg | ORAL_TABLET | Freq: Once | ORAL | 0 refills | Status: AC

## 2020-02-17 MED ORDER — AMLODIPINE BESYLATE 5 MG PO TABS: 5.0000 mg | ORAL_TABLET | Freq: Every day | ORAL | 0 refills | Status: DC

## 2020-02-17 NOTE — Progress Notes (Signed)
Patient [facility]   Established Patient Office Visit  Subjective:  Patient ID: Leah Barber, female    DOB: 1976/06/16  Age: 44 y.o. MRN: [id number]  CC:  Chief Complaint  Patient presents with  . Follow-up    swelling in leg , vomitting     HPI Leah Barber is a very pleasant 44 year old female with a medical history significant for idiopathic intracranial hypertension, type 2 diabetes mellitus, mild intermittent asthma, generalized anxiety, bipolar depression, history of polysubstance abuse, history of hyperlipidemia, and history of chronic pain syndrome presents for follow-up after emergency room visit.  Patient has a history of ongoing nausea and vomiting.  She was evaluated in the emergency department and treated with antiemetics for this problem.  Patient was referred to gastroenterologist at that time.  Patient was treated for ongoing GERD.  She will undergo EGD for further evaluation of nausea and vomiting.  Also, patient treated with polyethylene glycol for bowel changes/constipation.  She says that she is scheduled to follow-up with gastroenterologist within the next couple of weeks. Patient is aware of all appointments with specialists.  She has multiple specialists. She also has a history of ongoing polysubstance abuse, bipolar depression, and chronic pain syndrome.  Patient is followed closely by psychologist.  She is taking all prescribed medications without interruption.  Also, patient undergoes frequent counseling for this problem. Patient is complaining of white, thick vaginal discharge.  She has a long history of recurring vaginal yeast infections and intertrigo.  Yeast infections have been controlled on Diflucan in the past.  She has not attempted any over-the-counter interventions for this problem.  Patient is not sexually active at this time.  She denies fatigue, fever, abdominal pain, vaginal odor, vaginal itching, dysuria, or  vaginal burning.  Past Medical History:  Diagnosis Date  . Anemia   . Anxiety   . Asthma   . Chronic lower back pain   . Depression   . Diabetes mellitus without complication ([place])   . Dyspnea   . Hyperlipemia   . Hypertension   . IIH (idiopathic intracranial hypertension)   . Pseudotumor cerebri 08/13/2019    Past Surgical History:  Procedure Laterality Date  . CESAREAN SECTION     x 3  . CYSTOSCOPY Bilateral 11/25/2019   Procedure: CYSTOSCOPY;  Surgeon: Lavonia Drafts, MD;  Location: [facility];  Service: Gynecology;  Laterality: Bilateral;  . DERMOID CYST  EXCISION    . fluid removed from brain  12/20/2018  . ROBOTIC ASSISTED TOTAL HYSTERECTOMY Bilateral 11/25/2019   Procedure: XI ROBOTIC ASSISTED TOTAL HYSTERECTOMY WITH SALPINGECTOMY;  Surgeon: Lavonia Drafts, MD;  Location: [facility];  Service: Gynecology;  Laterality: Bilateral;    Family History  Problem Relation Age of Onset  . Diabetes Mother   . Hypertension Mother   . Cancer Father   . Liver cancer Father   . Colon cancer Neg Hx   . Esophageal cancer Neg Hx   . Rectal cancer Neg Hx   . Stomach cancer Neg Hx   . Pancreatic cancer Neg Hx   BP 135/83 (BP Location: Left Arm, Patient Position: Sitting, Cuff Size: Large)   Pulse 90   Temp (!) 97.2 F (36.2 C)   Ht 5\' 2"  (1.575 m)   Wt 206 lb (93.4 kg)   LMP 09/14/2019 (Approximate)   SpO2 97%   BMI 37.68 kg/m   Social History   Socioeconomic History  .  Marital status: Legally Separated    Spouse name: Not on file  . Number of children: Not on file  . Years of education: Not on file  . Highest education level: Not on file  Occupational History  . Not on file  Tobacco Use  . Smoking status: Current Every Day Smoker    Packs/day: 1.00    Types: Cigarettes  . Smokeless tobacco: Never Used  Vaping Use  . Vaping Use: Never used  Substance and Sexual Activity  . Alcohol use: No  . Drug use: No  .  Sexual activity: Not Currently  Other Topics Concern  . Not on file  Social History Narrative  . Not on file   Social Determinants of Health   Financial Resource Strain:   . Difficulty of Paying Living Expenses:   Food Insecurity:   . Worried About Charity fundraiser in the Last Year:   . Arboriculturist in the Last Year:   Transportation Needs:   . Film/video editor (Medical):   Marland Kitchen Lack of Transportation (Non-Medical):   Physical Activity:   . Days of Exercise per Week:   . Minutes of Exercise per Session:   Stress:   . Feeling of Stress :   Social Connections:   . Frequency of Communication with Friends and Family:   . Frequency of Social Gatherings with Friends and Family:   . Attends Religious Services:   . Active Member of Clubs or Organizations:   . Attends Archivist Meetings:   Marland Kitchen Marital Status:   Intimate Partner Violence:   . Fear of Current or Ex-Partner:   . Emotionally Abused:   Marland Kitchen Physically Abused:   . Sexually Abused:     Outpatient Medications Prior to Visit  Medication Sig Dispense Refill  . albuterol (VENTOLIN HFA) 108 (90 Base) MCG/ACT inhaler Inhale 2 puffs into the lungs every 4 (four) hours as needed for wheezing or shortness of breath. 6.7 g 2  . amLODipine (NORVASC) 10 MG tablet Take 1 tablet (10 mg total) by mouth daily. 90 tablet 1  . Buprenorphine HCl-Naloxone HCl 8-2 MG FILM Place 2 Film under the tongue daily.     . cetirizine (ZYRTEC) 10 MG tablet Take 1 tablet (10 mg total) by mouth daily. 30 tablet 11  . cloNIDine (CATAPRES) 0.1 MG tablet TAKE 1 TABLET BY MOUTH THREE TIMES A DAY 60 tablet 3  . CONCERTA 36 MG CR tablet Take 36 mg by mouth daily as needed (ADHD).     . DULoxetine (CYMBALTA) 60 MG capsule TAKE 1 CAPSULE (60 MG TOTAL) BY MOUTH 2 (TWO) TIMES DAILY. 180 capsule 2  . FLUoxetine (PROZAC) 40 MG capsule Take 1 capsule (40 mg total) by mouth every morning. 30 capsule 2  . fluticasone (FLONASE) 50 MCG/ACT nasal spray  INHALE 2 SPRAYS IN BOTH NOSTRILS DAILY (Patient taking differently: Place 2 sprays into both nostrils daily. ) 16 mL 2  . furosemide (LASIX) 20 MG tablet Take 1 tablet (20 mg total) by mouth daily. 90 tablet 1  . gabapentin (NEURONTIN) 400 MG capsule TAKE 2 CAPSULES (800 MG TOTAL) BY MOUTH 3 (THREE) TIMES DAILY. 90 capsule 5  . hydrOXYzine (VISTARIL) 50 MG capsule Take 50 mg by mouth 4 (four) times daily as needed for anxiety.     Marland Kitchen latanoprost (XALATAN) 0.005 % ophthalmic solution Place 1 drop into both eyes at bedtime. 2.5 mL 0  . metFORMIN (GLUCOPHAGE) 500 MG tablet Take 1 tablet (  500 mg total) by mouth 2 (two) times daily with a meal. 180 tablet 1  . Multiple Vitamins-Minerals (WOMENS MULTIVITAMIN PO) Take 1 tablet by mouth daily.     Marland Kitchen omeprazole (PRILOSEC) 40 MG capsule TAKE 1 CAPSULE BY MOUTH EVERY DAY 90 capsule 3  . ondansetron (ZOFRAN) 4 MG tablet Take 1 tablet (4 mg total) by mouth every 6 (six) hours as needed for nausea or vomiting. 60 tablet 1  . prazosin (MINIPRESS) 2 MG capsule Take by mouth.    . Spacer/Aero-Holding Chambers (AEROCHAMBER PLUS WITH MASK) inhaler Use as instructed 1 each 2  . baclofen (LIORESAL) 10 MG tablet Take 1 tablet (10 mg total) by mouth 3 (three) times daily. (Patient taking differently: Take 10 mg by mouth 3 (three) times daily as needed for muscle spasms. ) 90 each 1   No facility-administered medications prior to visit.    Allergies  Allergen Reactions  . Clindamycin/Lincomycin Nausea Only  . Other Other (See Comments)    REFUSED BLOOD TRANSFUSION AND BLOOD PRODUCTS  . Hydrocodone Nausea And Vomiting and Other (See Comments)    stomach pain   . Morphine Nausea And Vomiting    ROS Review of Systems  Constitutional: Negative.   HENT: Negative.   Respiratory: Negative.   Cardiovascular: Negative.  Negative for chest pain and palpitations.  Gastrointestinal: Positive for abdominal distention.  Endocrine: Negative.  Negative for polydipsia,  polyphagia and polyuria.  Genitourinary: Negative.   Musculoskeletal: Negative.   Neurological: Negative.   Hematological: Negative.   Psychiatric/Behavioral: Negative.      Objective:    Physical Exam Constitutional:      Appearance: Normal appearance.  HENT:     Nose: Nose normal.  Eyes:     Pupils: Pupils are equal, round, and reactive to light.  Cardiovascular:     Rate and Rhythm: Normal rate and regular rhythm.     Pulses: Normal pulses.  Pulmonary:     Effort: Pulmonary effort is normal.  Abdominal:     General: Abdomen is flat. Bowel sounds are normal.  Skin:    General: Skin is warm.  Neurological:     General: No focal deficit present.     Mental Status: She is alert. Mental status is at baseline.  Psychiatric:        Mood and Affect: Mood normal.        Behavior: Behavior normal.        Thought Content: Thought content normal.        Judgment: Judgment normal.     BP 135/83 (BP Location: Left Arm, Patient Position: Sitting, Cuff Size: Large)   Pulse 90   Temp (!) 97.2 F (36.2 C)   Ht 5\' 2"  (1.575 m)   Wt 206 lb (93.4 kg)   LMP 09/14/2019 (Approximate)   SpO2 97%   BMI 37.68 kg/m  Wt Readings from Last 3 Encounters:  02/17/20 206 lb (93.4 kg)  01/22/20 198 lb (89.8 kg)  01/13/20 200 lb 4 oz (90.8 kg)     Health Maintenance Due  Topic Date Due  . COVID-19 Vaccine (1) Never done    There are no preventive care reminders to display for this patient.  Lab Results  Component Value Date   TSH 1.610 12/24/2019   Lab Results  Component Value Date   WBC 14.2 (H) 01/22/2020   HGB 14.6 01/22/2020   HCT 45.4 01/22/2020   MCV 94.0 01/22/2020   PLT 210 01/22/2020   Lab Results  Component Value Date   NA 138 01/22/2020   K 3.6 01/22/2020   CO2 28 01/22/2020   GLUCOSE 145 (H) 01/22/2020   BUN 13 01/22/2020   CREATININE 0.61 01/22/2020   BILITOT 0.7 01/22/2020   ALKPHOS 108 01/22/2020   AST 41 01/22/2020   ALT 47 (H) 01/22/2020   PROT 7.2  01/22/2020   ALBUMIN 4.0 01/22/2020   CALCIUM 8.8 (L) 01/22/2020   ANIONGAP 9 01/22/2020   Lab Results  Component Value Date   CHOL 216 (H) 11/23/2016   Lab Results  Component Value Date   HDL 47 (L) 11/23/2016   Lab Results  Component Value Date   LDLCALC 151 (H) 11/23/2016   Lab Results  Component Value Date   TRIG 92 11/23/2016   Lab Results  Component Value Date   CHOLHDL 4.6 11/23/2016   Lab Results  Component Value Date   HGBA1C 6.4 (A) 11/11/2019      Assessment & Plan:   Problem List Items Addressed This Visit    None    Visit Diagnoses    Type 2 diabetes mellitus without complication, without long-term current use of insulin ([place])    -  Primary   Relevant Orders   HgB A1c   POCT Urinalysis Dipstick      Type 2 diabetes mellitus without complication, without long-term current use of insulin ([facility]) Patient's hemoglobin A1c is 6.2, which is improved from previous.  No medication changes warranted on today.  Patient advised to follow carbohydrate modified diet divided over small meals throughout the day.  Patient is not interested in diabetes and nutrition at this time. - HgB A1c - POCT Urinalysis Dipstick  Essential hypertension Patient endorses some lower extremity edema.  Blood pressures well controlled, lower amlodipine to 5 mg daily.  Patient will return in 1 month for blood pressure assessment. - amLODipine (NORVASC) 5 MG tablet; Take 1 tablet (5 mg total) by mouth daily.  Dispense: 90 tablet; Refill: 0   Mild intermittent asthma, unspecified whether complicated Patient has well-controlled asthma.  She has not had an asthma exacerbation over the past year.  Patient advised to refrain from smoking cigarettes.  She has attempted smoking cessation in the past without success.  Continue albuterol inhalers in the event of persistent cough, shortness of breath, and/or wheezing.   Polysubstance dependence including opioid type drug with complication,  episodic abuse, with unspecified complication ([place]) Patient is followed closely by counselors for history of polysubstance dependence.  She has done quite well in refraining from drug use.  We often discuss her ongoing sobriety.  Patient feels very well and looks forward to weekly counseling sessions.  Yeast infection Patient has recurrent yeast infections, which have been improved by Diflucan in the past. - fluconazole (DIFLUCAN) 150 MG tablet; Take 1 tablet (150 mg total) by mouth once for 1 dose.  Dispense: 1 tablet; Refill: 0  Follow-up: Return in about 1 month (around 03/18/2020).   [name]  APRN, MSN, FNP-C Patient [facility] [address] [place], [place] [address] [phone]

## 2020-02-17 NOTE — Patient Instructions (Signed)
Will reduce Amlodipine to 5 mg. Will follow up in 1 month.

## 2020-03-01 ENCOUNTER — Encounter: Payer: Self-pay | Admitting: Gastroenterology

## 2020-03-08 ENCOUNTER — Encounter: Payer: Medicaid Other | Admitting: Gastroenterology

## 2020-03-16 ENCOUNTER — Ambulatory Visit: Payer: Medicaid Other | Admitting: Family Medicine

## 2020-03-17 ENCOUNTER — Telehealth: Payer: Self-pay | Admitting: Family Medicine

## 2020-03-17 ENCOUNTER — Other Ambulatory Visit: Payer: Self-pay | Admitting: Family Medicine

## 2020-03-17 MED ORDER — IBUPROFEN 800 MG PO TABS
800.0000 mg | ORAL_TABLET | Freq: Three times a day (TID) | ORAL | 1 refills | Status: DC | PRN
Start: 2020-03-17 — End: 2020-03-23

## 2020-03-17 NOTE — Progress Notes (Signed)
Meds ordered this encounter  Medications  . ibuprofen (ADVIL) 800 MG tablet    Sig: Take 1 tablet (800 mg total) by mouth every 8 (eight) hours as needed.    Dispense:  30 tablet    Refill:  1    Order Specific Question:   Supervising Provider    Answer:   Tresa Garter [[id number]]  [name]  APRN, MSN, FNP-C [facility] [address] [place], [place] [address] [phone]

## 2020-03-22 ENCOUNTER — Ambulatory Visit: Payer: Medicaid Other

## 2020-03-22 ENCOUNTER — Other Ambulatory Visit: Payer: Self-pay

## 2020-03-22 VITALS — Ht 62.0 in | Wt 209.8 lb

## 2020-03-22 DIAGNOSIS — K219 Gastro-esophageal reflux disease without esophagitis: Secondary | ICD-10-CM

## 2020-03-22 DIAGNOSIS — Z1211 Encounter for screening for malignant neoplasm of colon: Secondary | ICD-10-CM

## 2020-03-22 MED ORDER — NA SULFATE-K SULFATE-MG SULF 17.5-3.13-1.6 GM/177ML PO SOLN: 1.0000 | Freq: Once | ORAL | 0 refills | Status: AC

## 2020-03-22 NOTE — Telephone Encounter (Signed)
Done

## 2020-03-22 NOTE — Progress Notes (Signed)
Denies allergies to eggs or soy products. Denies complication of anesthesia or sedation. Denies use of weight loss medication. Denies use of O2.   Emmi instructions given for colonoscopy.  Patient is scheduled for covid testing  Thursday 04/01/20 @ 2:00 Pm. Patient states she is going to try to get the covid vaccine prior to this appointment. Patient will call us back if she is able to get the appointment.

## 2020-03-23 ENCOUNTER — Ambulatory Visit: Payer: Medicaid Other | Admitting: Adult Health

## 2020-03-23 ENCOUNTER — Ambulatory Visit (INDEPENDENT_AMBULATORY_CARE_PROVIDER_SITE_OTHER): Payer: Medicaid Other | Admitting: Family Medicine

## 2020-03-23 ENCOUNTER — Other Ambulatory Visit: Payer: Self-pay

## 2020-03-23 VITALS — BP 129/86 | HR 87 | Temp 98.3°F | Resp 16 | Ht 62.0 in | Wt 208.0 lb

## 2020-03-23 DIAGNOSIS — J4520 Mild intermittent asthma, uncomplicated: Secondary | ICD-10-CM

## 2020-03-23 DIAGNOSIS — F17200 Nicotine dependence, unspecified, uncomplicated: Secondary | ICD-10-CM

## 2020-03-23 DIAGNOSIS — I10 Essential (primary) hypertension: Secondary | ICD-10-CM

## 2020-03-23 DIAGNOSIS — G4700 Insomnia, unspecified: Secondary | ICD-10-CM

## 2020-03-23 DIAGNOSIS — F1920 Other psychoactive substance dependence, uncomplicated: Secondary | ICD-10-CM | POA: Diagnosis not present

## 2020-03-23 DIAGNOSIS — Z9109 Other allergy status, other than to drugs and biological substances: Secondary | ICD-10-CM

## 2020-03-23 MED ORDER — IBUPROFEN 800 MG PO TABS
800.0000 mg | ORAL_TABLET | Freq: Three times a day (TID) | ORAL | 5 refills | Status: DC | PRN
Start: 2020-03-23 — End: 2020-05-17

## 2020-03-23 MED ORDER — ZOLPIDEM TARTRATE 5 MG PO TABS: 5.0000 mg | ORAL_TABLET | Freq: Every evening | ORAL | 0 refills | Status: DC | PRN

## 2020-03-23 MED ORDER — AMLODIPINE BESYLATE 5 MG PO TABS: 5.0000 mg | ORAL_TABLET | Freq: Every day | ORAL | 1 refills | Status: DC

## 2020-03-23 NOTE — Patient Instructions (Addendum)
COVID-19 Vaccine Information can be found at: ShippingScam.co.uk For questions related to vaccine distribution or appointments, please email [email or web address] or call [phone].    Vagisil body wash   Bacterial Vaginosis  Bacterial vaginosis is an infection of the vagina. It happens when too many normal germs (healthy bacteria) grow in the vagina. This infection puts you at risk for infections from sex (STIs). Treating this infection can lower your risk for some STIs. You should also treat this if you are pregnant. It can cause your baby to be born early. Follow these instructions at home: Medicines  Take over-the-counter and prescription medicines only as told by your doctor.  Take or use your antibiotic medicine as told by your doctor. Do not stop taking or using it even if you start to feel better. General instructions  If you your sexual partner is a woman, tell her that you have this infection. She needs to get treatment if she has symptoms. If you have a female partner, he does not need to be treated.  During treatment: ? Avoid sex. ? Do not douche. ? Avoid alcohol as told. ? Avoid breastfeeding as told.  Drink enough fluid to keep your pee (urine) clear or pale yellow.  Keep your vagina and butt (rectum) clean. ? Wash the area with warm water every day. ? Wipe from front to back after you use the toilet.  Keep all follow-up visits as told by your doctor. This is important. Preventing this condition  Do not douche.  Use only warm water to wash around your vagina.  Use protection when you have sex. This includes: ? Latex condoms. ? Dental dams.  Limit how many people you have sex with. It is best to only have sex with the same person (be monogamous).  Get tested for STIs. Have your partner get tested.  Wear underwear that is cotton or lined with cotton.  Avoid tight pants and pantyhose. This is most  important in summer.  Do not use any products that have nicotine or tobacco in them. These include cigarettes and e-cigarettes. If you need help quitting, ask your doctor.  Do not use illegal drugs.  Limit how much alcohol you drink. Contact a doctor if:  Your symptoms do not get better, even after you are treated.  You have more discharge or pain when you pee (urinate).  You have a fever.  You have pain in your belly (abdomen).  You have pain with sex.  Your bleed from your vagina between periods. Summary  This infection happens when too many germs (bacteria) grow in the vagina.  Treating this condition can lower your risk for some infections from sex (STIs).  You should also treat this if you are pregnant. It can cause early (premature) birth.  Do not stop taking or using your antibiotic medicine even if you start to feel better. This information is not intended to replace advice given to you by your health care provider. Make sure you discuss any questions you have with your health care provider. Document Revised: 07/20/2017 Document Reviewed: 04/22/2016 Elsevier Patient Education  2020 [employer].

## 2020-03-24 ENCOUNTER — Telehealth: Payer: Self-pay

## 2020-03-24 NOTE — Telephone Encounter (Signed)
Pt called stating she is having vaginal odor and discharge. Pt states she is unable to hold her urine and is concerned about her PH balance. Pt is scheduled for an OV on 03/29/20.  Leah Barber l Rayan Dyal, CMA

## 2020-03-26 ENCOUNTER — Other Ambulatory Visit: Payer: Self-pay | Admitting: Family Medicine

## 2020-03-26 ENCOUNTER — Telehealth: Payer: Self-pay

## 2020-03-26 MED ORDER — FLUOXETINE HCL 40 MG PO CAPS: 40.0000 mg | ORAL_CAPSULE | Freq: Every morning | ORAL | 2 refills | Status: DC

## 2020-03-26 NOTE — Telephone Encounter (Signed)
Refilled

## 2020-03-26 NOTE — Telephone Encounter (Signed)
Pt called very upset, states she was told in previsit that if she got a covid vaccine she would not have to be tested. She had the shot and is now under the impression she does not have to have covid testing. Pt was very rude, cursing at the scheduler. Call was transferred to this RN. Spoke with pt and started to explain that unfortunately this is not the case that you had to be 2 weeks after the completion of your vaccine to not be tested.  Pt hung up on this RN.

## 2020-03-29 ENCOUNTER — Encounter: Payer: Self-pay | Admitting: Obstetrics & Gynecology

## 2020-03-29 ENCOUNTER — Other Ambulatory Visit (HOSPITAL_COMMUNITY)
Admission: RE | Admit: 2020-03-29 | Discharge: 2020-03-29 | Disposition: A | Discharge status: Home / Self Care | Payer: Medicaid Other | Source: Ambulatory Visit | Attending: Obstetrics & Gynecology | Admitting: Obstetrics & Gynecology

## 2020-03-29 ENCOUNTER — Ambulatory Visit (INDEPENDENT_AMBULATORY_CARE_PROVIDER_SITE_OTHER): Payer: Medicaid Other | Admitting: Obstetrics & Gynecology

## 2020-03-29 ENCOUNTER — Other Ambulatory Visit: Payer: Self-pay

## 2020-03-29 VITALS — BP 136/81 | HR 92 | Wt 209.0 lb

## 2020-03-29 DIAGNOSIS — N898 Other specified noninflammatory disorders of vagina: Secondary | ICD-10-CM | POA: Insufficient documentation

## 2020-03-29 DIAGNOSIS — R300 Dysuria: Secondary | ICD-10-CM | POA: Diagnosis not present

## 2020-03-29 LAB — POCT URINALYSIS DIPSTICK
Blood, UA: NEGATIVE
Glucose, UA: POSITIVE — AB
Ketones, UA: NEGATIVE
Protein, UA: NEGATIVE
Spec Grav, UA: 1.015 (ref 1.010–1.025)
pH, UA: 6 (ref 5.0–8.0)

## 2020-03-29 NOTE — Patient Instructions (Signed)
GO WHITE: Soap: UNSCENTED Dove (white box light green writing) Laundry detergent (underwear)- Dreft or [employer] unscented WHITE 100% cotton panties (NOT just cotton crouch) Sanitary napkin/panty liners: UNSCENTED.  If it doesn't SAY unscented it can have a scent/perfume    NO PERFUMES OR LOTIONS OR POTIONS in the vulvar area (may use regular [place]) Condoms: hypoallergenic only. Non dyed (no color) Toilet papers: white only Wash clothes: use a separate wash cloth. WHITE.  Washed in Dreft.  

## 2020-03-29 NOTE — Progress Notes (Signed)
History:  44 y.o. [id number] here today for eval of vaginal odor and mild dysuria. Pt reports that she has consistently had an odor. She thought it was a yeast infection. She is using Dove shea butter soap. She feels that she is having a reaction to condoms but, is not currently sexually active.    The following portions of the patient's history were reviewed and updated as appropriate: allergies, current medications, past family history, past medical history, past social history, past surgical history and problem list.  Review of Systems:  Pertinent items are noted in HPI.    Objective:  Physical Exam Blood pressure 136/81, pulse 92, weight 209 lb (94.8 kg), last menstrual period 09/14/2019.  CONSTITUTIONAL: Well-developed, well-nourished female in no acute distress.  HENT:  Normocephalic, atraumatic EYES: Conjunctivae and EOM are normal. No scleral icterus.  NECK: Normal range of motion SKIN: Skin is warm and dry. No rash noted. Not diaphoretic.No pallor. [place]: Alert and oriented to person, place, and time. Normal coordination.  Pelvic: Normal appearing external genitalia; normal appearing vaginal mucosa. The vaginal cuff is well healed. There is no abnormal discharge or lesions.   Labs and Imaging UA: +leuk;   Assessment & Plan:  Cashe was seen today for vaginal discharge.  Diagnoses and all orders for this visit:  Vaginal odor -     Cervicovaginal ancillary only( [place])  Dysuria -     Urine Culture -     POCT Urinalysis Dipstick  Pt given GO WHITE instructions.   Total face-to-face time with patient was 18 min.  Greater than 50% was spent in counseling and coordination of care with the patient.   Gayanne Prescott L. Harraway-Smith, M.D., Cherlynn June

## 2020-03-30 LAB — CERVICOVAGINAL ANCILLARY ONLY
Bacterial Vaginitis (gardnerella): NEGATIVE
Candida Glabrata: NEGATIVE
Candida Vaginitis: NEGATIVE
Comment: NEGATIVE
Comment: NEGATIVE
Comment: NEGATIVE

## 2020-03-31 LAB — URINE CULTURE: Organism ID, Bacteria: NO GROWTH

## 2020-04-01 ENCOUNTER — Ambulatory Visit (INDEPENDENT_AMBULATORY_CARE_PROVIDER_SITE_OTHER): Payer: Medicaid Other

## 2020-04-01 ENCOUNTER — Other Ambulatory Visit: Payer: Self-pay | Admitting: Gastroenterology

## 2020-04-01 DIAGNOSIS — Z1159 Encounter for screening for other viral diseases: Secondary | ICD-10-CM

## 2020-04-01 LAB — SARS CORONAVIRUS 2 (TAT 6-24 HRS): SARS Coronavirus 2: NEGATIVE

## 2020-04-05 ENCOUNTER — Encounter: Payer: Self-pay | Admitting: Gastroenterology

## 2020-04-05 ENCOUNTER — Ambulatory Visit (AMBULATORY_SURGERY_CENTER): Payer: Medicaid Other | Admitting: Gastroenterology

## 2020-04-05 ENCOUNTER — Telehealth: Payer: Self-pay | Admitting: *Deleted

## 2020-04-05 ENCOUNTER — Encounter: Payer: Medicaid Other | Admitting: Gastroenterology

## 2020-04-05 ENCOUNTER — Other Ambulatory Visit: Payer: Self-pay

## 2020-04-05 VITALS — BP 140/104 | HR 57 | Temp 97.7°F | Resp 14 | Ht 62.0 in | Wt 209.0 lb

## 2020-04-05 DIAGNOSIS — R194 Change in bowel habit: Secondary | ICD-10-CM | POA: Diagnosis not present

## 2020-04-05 DIAGNOSIS — R110 Nausea: Secondary | ICD-10-CM

## 2020-04-05 DIAGNOSIS — K319 Disease of stomach and duodenum, unspecified: Secondary | ICD-10-CM | POA: Diagnosis not present

## 2020-04-05 DIAGNOSIS — Z1211 Encounter for screening for malignant neoplasm of colon: Secondary | ICD-10-CM

## 2020-04-05 DIAGNOSIS — K219 Gastro-esophageal reflux disease without esophagitis: Secondary | ICD-10-CM

## 2020-04-05 DIAGNOSIS — K2970 Gastritis, unspecified, without bleeding: Secondary | ICD-10-CM | POA: Diagnosis not present

## 2020-04-05 DIAGNOSIS — D128 Benign neoplasm of rectum: Secondary | ICD-10-CM

## 2020-04-05 DIAGNOSIS — K621 Rectal polyp: Secondary | ICD-10-CM

## 2020-04-05 DIAGNOSIS — K2950 Unspecified chronic gastritis without bleeding: Secondary | ICD-10-CM

## 2020-04-05 MED ORDER — SODIUM CHLORIDE 0.9 % IV SOLN: 500.0000 mL | Freq: Once | INTRAVENOUS | Status: DC

## 2020-04-05 NOTE — Op Note (Signed)
[facility] Patient Name: Leah Barber Procedure Date: 04/05/2020 2:17 PM MRN: [id number] Endoscopist: Sherilyn Cooter L. Myrtie Neither , MD Age: 44 Referring MD:  Date of Birth: 1975-09-18 Gender: Female Account #: [id number] Procedure:                Upper GI endoscopy Indications:              Esophageal reflux symptoms that persist despite                            appropriate therapy, Nausea Medicines:                Monitored Anesthesia Care Procedure:                Pre-Anesthesia Assessment:                           - Prior to the procedure, a History and Physical                            was performed, and patient medications and                            allergies were reviewed. The patient's tolerance of                            previous anesthesia was also reviewed. The risks                            and benefits of the procedure and the sedation                            options and risks were discussed with the patient.                            All questions were answered, and informed consent                            was obtained. Prior Anticoagulants: The patient has                            taken no previous anticoagulant or antiplatelet                            agents. ASA Grade Assessment: III - A patient with                            severe systemic disease. After reviewing the risks                            and benefits, the patient was deemed in                            satisfactory condition to undergo the procedure.  After obtaining informed consent, the endoscope was                            passed under direct vision. Throughout the                            procedure, the patient's blood pressure, pulse, and                            oxygen saturations were monitored continuously. The                            Endoscope was introduced through the mouth, and                            advanced to the second part  of duodenum. The upper                            GI endoscopy was accomplished without difficulty.                            The patient tolerated the procedure well. Scope In: Scope Out: Findings:                 The esophagus was normal.                           Patchy mildly edematous and erythematous mucosa was                            found in the entire examined stomach. Biopsies were                            taken with a cold forceps for histology. ([place]                            protocol).                           The exam of the stomach was otherwise normal,                            including on retroflexion.                           The examined duodenum was normal. Complications:            No immediate complications. Estimated Blood Loss:     Estimated blood loss was minimal. Impression:               - Normal esophagus.                           - Mild esematous and erythematous gastropathy                            (non-specific).  Biopsied.                           - Normal examined duodenum.                           If biopsies negative for H pylori, nausea likely to                            be at least partially related to polypharmacy. Recommendation:           - Patient has a contact number available for                            emergencies. The signs and symptoms of potential                            delayed complications were discussed with the                            patient. Return to normal activities tomorrow.                            Written discharge instructions were provided to the                            patient.                           - Resume previous diet.                           - Continue present medications.                           - Await pathology results.                           - See the other procedure note for documentation of                            additional recommendations.                            - Follow an antireflux regimen indefinitely. Halley Shepheard L. Myrtie Neither, MD 04/05/2020 3:12:52 PM This report has been signed electronically.

## 2020-04-05 NOTE — Progress Notes (Signed)
Pt. Reports no change in her medical or surgical history since her pre-visit 03/22/2020.

## 2020-04-05 NOTE — Op Note (Signed)
[facility] Patient Name: Leah Barber Procedure Date: 04/05/2020 2:18 PM MRN: [id number] Endoscopist: Sherilyn Cooter L. Myrtie Neither , MD Age: 44 Referring MD:  Date of Birth: July 20, 1976 Gender: Female Account #: [id number] Procedure:                Colonoscopy Indications:              Screening for colorectal malignant neoplasm, This                            is the patient's first colonoscopy                           Patient has also recently noticed a change in stool                            caliber/thinner stools. Medicines:                Monitored Anesthesia Care Procedure:                Pre-Anesthesia Assessment:                           - Prior to the procedure, a History and Physical                            was performed, and patient medications and                            allergies were reviewed. The patient's tolerance of                            previous anesthesia was also reviewed. The risks                            and benefits of the procedure and the sedation                            options and risks were discussed with the patient.                            All questions were answered, and informed consent                            was obtained. Prior Anticoagulants: The patient has                            taken no previous anticoagulant or antiplatelet                            agents. ASA Grade Assessment: III - A patient with                            severe systemic disease. After reviewing the risks  and benefits, the patient was deemed in                            satisfactory condition to undergo the procedure.                           After obtaining informed consent, the colonoscope                            was passed under direct vision. Throughout the                            procedure, the patient's blood pressure, pulse, and                            oxygen saturations were monitored continuously.  The                            Colonoscope was introduced through the anus and                            advanced to the the cecum, identified by                            appendiceal orifice and ileocecal valve. The                            colonoscopy was performed without difficulty. The                            patient tolerated the procedure well. The quality                            of the bowel preparation was good. The ileocecal                            valve, appendiceal orifice, and rectum were                            photographed. The bowel preparation used was 2 day                            Suprep/Miralax. Scope In: 2:37:04 PM Scope Out: 2:54:34 PM Scope Withdrawal Time: 0 hours 13 minutes 6 seconds  Total Procedure Duration: 0 hours 17 minutes 30 seconds  Findings:                 The perianal and digital rectal examinations were                            normal.                           A 3 mm polyp was found in the distal rectum. The  polyp was sessile. The polyp was removed with a                            cold snare. Resection and retrieval were complete.                           The exam was otherwise without abnormality on                            direct and retroflexion views. Complications:            No immediate complications. Estimated Blood Loss:     Estimated blood loss was minimal. Impression:               - One 3 mm polyp in the distal rectum, removed with                            a cold snare. Resected and retrieved.                           - The examination was otherwise normal on direct                            and retroflexion views. Recommendation:           - Patient has a contact number available for                            emergencies. The signs and symptoms of potential                            delayed complications were discussed with the                            patient. Return to normal  activities tomorrow.                            Written discharge instructions were provided to the                            patient.                           - Resume previous diet.                           - Continue present medications.                           - Await pathology results.                           - Repeat colonoscopy is recommended for                            surveillance. The colonoscopy date will be  determined after pathology results from today's                            exam become available for review.                           - Use Benefiber one tablespoon PO daily.                           - See the other procedure note for documentation of                            additional recommendations. Katlynn Naser L. Myrtie Neither, MD 04/05/2020 3:08:16 PM This report has been signed electronically.

## 2020-04-05 NOTE — Progress Notes (Signed)
Pt's states no medical or surgical changes since previsit or office visit.  Courtney- vitals 

## 2020-04-05 NOTE — Progress Notes (Signed)
To PACU VSS. Report to RN.tb 

## 2020-04-05 NOTE — Progress Notes (Signed)
Called to room to assist during endoscopic procedure.  Patient ID and intended procedure confirmed with present staff. Received instructions for my participation in the procedure from the performing physician.  

## 2020-04-05 NOTE — Patient Instructions (Signed)
Information on polyps given to you today.  Await pathology results.  Resume previous diet and medications.  Use Benefiber 1 tablespoon by mouth each day.  YOU HAD AN ENDOSCOPIC PROCEDURE TODAY AT [facility]:   Refer to the procedure report that was given to you for any specific questions about what was found during the examination.  If the procedure report does not answer your questions, please call your gastroenterologist to clarify.  If you requested that your care partner not be given the details of your procedure findings, then the procedure report has been included in a sealed envelope for you to review at your convenience later.  YOU SHOULD EXPECT: Some feelings of bloating in the abdomen. Passage of more gas than usual.  Walking can help get rid of the air that was put into your GI tract during the procedure and reduce the bloating. If you had a lower endoscopy (such as a colonoscopy or flexible sigmoidoscopy) you may notice spotting of blood in your stool or on the toilet paper. If you underwent a bowel prep for your procedure, you may not have a normal bowel movement for a few days.  Please Note:  You might notice some irritation and congestion in your nose or some drainage.  This is from the oxygen used during your procedure.  There is no need for concern and it should clear up in a day or so.  SYMPTOMS TO REPORT IMMEDIATELY:   Following lower endoscopy (colonoscopy or flexible sigmoidoscopy):  Excessive amounts of blood in the stool  Significant tenderness or worsening of abdominal pains  Swelling of the abdomen that is new, acute  Fever of 100F or higher   Following upper endoscopy (EGD)  Vomiting of blood or coffee ground material  New chest pain or pain under the shoulder blades  Painful or persistently difficult swallowing  New shortness of breath  Fever of 100F or higher  Black, tarry-looking stools  For urgent or emergent issues, a  gastroenterologist can be reached at any hour by calling [phone]. Do not use MyChart messaging for urgent concerns.    DIET:  We do recommend a small meal at first, but then you may proceed to your regular diet.  Drink plenty of fluids but you should avoid alcoholic beverages for 24 hours.  ACTIVITY:  You should plan to take it easy for the rest of today and you should NOT DRIVE or use heavy machinery until tomorrow (because of the sedation medicines used during the test).    FOLLOW UP: Our staff will call the number listed on your records 48-72 hours following your procedure to check on you and address any questions or concerns that you may have regarding the information given to you following your procedure. If we do not reach you, we will leave a message.  We will attempt to reach you two times.  During this call, we will ask if you have developed any symptoms of COVID 19. If you develop any symptoms (ie: fever, flu-like symptoms, shortness of breath, cough etc.) before then, please call [phone].  If you test positive for Covid 19 in the 2 weeks post procedure, please call and report this information to [place].    If any biopsies were taken you will be contacted by phone or by letter within the next 1-3 weeks.  Please call us at [phone] if you have not heard about the biopsies in 3 weeks.    SIGNATURES/CONFIDENTIALITY: You and/or your  care partner have signed paperwork which will be entered into your electronic medical record.  These signatures attest to the fact that that the information above on your After Visit Summary has been reviewed and is understood.  Full responsibility of the confidentiality of this discharge information lies with you and/or your care-partner.

## 2020-04-05 NOTE — Telephone Encounter (Signed)
Pt called ans stated she "threw up" some of the prep after drinking her water.  She followed her two day prep exactly and states her stools are clear, with a "little yellow color to it."  Told her to be NPO at 12:30 pm and arrive at 1:30 as scheduled.  Understanding voiced.

## 2020-04-07 ENCOUNTER — Telehealth: Payer: Self-pay | Admitting: *Deleted

## 2020-04-07 NOTE — Telephone Encounter (Signed)
Called pt back to tell her Dr Loletha Carrow is aware of her nausea and vomiting and her concern and that he is waiting for her biopsies to results and he will know more then and notify her of those .

## 2020-04-07 NOTE — Progress Notes (Signed)
[facility]   Established Patient Office Visit  Subjective:  Patient ID: Leah Barber, female    DOB: 01-04-1976  Age: 44 y.o. MRN: [id number]  CC:  Chief Complaint  Patient presents with  . Follow-up    HPI Leah Barber is a 44 year old female with a medical history significant for essential hypertension, mild intermittent asthma, chronic pain syndrome, history of polysubstance abuse, GERD, glaucoma, and history of idiopathic intercranial hypertension presents for follow up. Today, patient is complaining of severe low back pain. She says that pain has been worsening over the past several days. Patient states that pain is worsened with ambulation and when transitioning from laying to sitting. Patient has been taking Ibuprofen consistently with minimal improvement. Pain intensity is 10/10. She is requesting opiate pain medication. Patient is currently on suboxone that is managed by pain clinic. She has also completed drug rehabilitation for opiate addiction.   Patient is also complaining of insomnia. She says that she has been unable to sleep at night. Patient reports taking daytime naps. She typically falls asleep around 2 am. She often falls asleep watching TV in bed. She has taken tylenol pm without relief.   Past Medical History:  Diagnosis Date  . Allergy   . Anemia   . Anxiety   . Arthritis   . Asthma   . Chronic lower back pain   . Depression   . Diabetes mellitus without complication ([place])   . Dyspnea   . GERD (gastroesophageal reflux disease)   . Glaucoma   . Hyperlipemia   . Hypertension   . IIH (idiopathic intracranial hypertension)   . Pseudotumor cerebri 08/13/2019  . Substance abuse ([place])    Rx drugs for pain medication. Has not had in 5 years.    Past Surgical History:  Procedure Laterality Date  . CESAREAN SECTION     x 3  . CYSTOSCOPY Bilateral 11/25/2019   Procedure: CYSTOSCOPY;  Surgeon: Lavonia Drafts, MD;  Location: [facility];  Service: Gynecology;  Laterality: Bilateral;  . DERMOID CYST  EXCISION    . fluid removed from brain  12/20/2018  . ROBOTIC ASSISTED TOTAL HYSTERECTOMY Bilateral 11/25/2019   Procedure: XI ROBOTIC ASSISTED TOTAL HYSTERECTOMY WITH SALPINGECTOMY;  Surgeon: Lavonia Drafts, MD;  Location: [facility];  Service: Gynecology;  Laterality: Bilateral;    Family History  Problem Relation Age of Onset  . Diabetes Mother   . Hypertension Mother   . Cancer Father   . Liver cancer Father   . Colon cancer Neg Hx   . Esophageal cancer Neg Hx   . Rectal cancer Neg Hx   . Stomach cancer Neg Hx   . Pancreatic cancer Neg Hx     Social History   Socioeconomic History  . Marital status: Legally Separated    Spouse name: Not on file  . Number of children: Not on file  . Years of education: Not on file  . Highest education level: Not on file  Occupational History  . Not on file  Tobacco Use  . Smoking status: Current Every Day Smoker    Packs/day: 1.00    Types: Cigarettes  . Smokeless tobacco: Never Used  Vaping Use  . Vaping Use: Never used  Substance and Sexual Activity  . Alcohol use: Yes    Comment: occasional  . Drug use: No  . Sexual activity: Not Currently  Other Topics Concern  .  Not on file  Social History Narrative  . Not on file   Social Determinants of Health   Financial Resource Strain:   . Difficulty of Paying Living Expenses:   Food Insecurity:   . Worried About Charity fundraiser in the Last Year:   . Arboriculturist in the Last Year:   Transportation Needs:   . Film/video editor (Medical):   Marland Kitchen Lack of Transportation (Non-Medical):   Physical Activity:   . Days of Exercise per Week:   . Minutes of Exercise per Session:   Stress:   . Feeling of Stress :   Social Connections:   . Frequency of Communication with Friends and Family:   . Frequency of Social Gatherings with Friends and  Family:   . Attends Religious Services:   . Active Member of Clubs or Organizations:   . Attends Archivist Meetings:   Marland Kitchen Marital Status:   Intimate Partner Violence:   . Fear of Current or Ex-Partner:   . Emotionally Abused:   Marland Kitchen Physically Abused:   . Sexually Abused:     Outpatient Medications Prior to Visit  Medication Sig Dispense Refill  . albuterol (VENTOLIN HFA) 108 (90 Base) MCG/ACT inhaler Inhale 2 puffs into the lungs every 4 (four) hours as needed for wheezing or shortness of breath. 6.7 g 2  . baclofen (LIORESAL) 10 MG tablet Take 1 tablet (10 mg total) by mouth 3 (three) times daily. (Patient not taking: Reported on 04/05/2020) 90 each 1  . Buprenorphine HCl-Naloxone HCl 8-2 MG FILM Place 2 Film under the tongue daily.     . cetirizine (ZYRTEC) 10 MG tablet Take 1 tablet (10 mg total) by mouth daily. 30 tablet 11  . cloNIDine (CATAPRES) 0.1 MG tablet TAKE 1 TABLET BY MOUTH THREE TIMES A DAY 60 tablet 3  . CONCERTA 36 MG CR tablet Take 36 mg by mouth daily as needed (ADHD).  (Patient not taking: Reported on 04/05/2020)    . DULoxetine (CYMBALTA) 60 MG capsule TAKE 1 CAPSULE (60 MG TOTAL) BY MOUTH 2 (TWO) TIMES DAILY. 180 capsule 2  . fluticasone (FLONASE) 50 MCG/ACT nasal spray INHALE 2 SPRAYS IN BOTH NOSTRILS DAILY (Patient taking differently: Place 2 sprays into both nostrils daily. ) 16 mL 2  . furosemide (LASIX) 20 MG tablet Take 1 tablet (20 mg total) by mouth daily. 90 tablet 1  . gabapentin (NEURONTIN) 400 MG capsule TAKE 2 CAPSULES (800 MG TOTAL) BY MOUTH 3 (THREE) TIMES DAILY. 90 capsule 5  . hydrOXYzine (VISTARIL) 50 MG capsule Take 50 mg by mouth 4 (four) times daily as needed for anxiety.  (Patient not taking: Reported on 04/05/2020)    . latanoprost (XALATAN) 0.005 % ophthalmic solution Place 1 drop into both eyes at bedtime. 2.5 mL 0  . metFORMIN (GLUCOPHAGE) 500 MG tablet Take 1 tablet (500 mg total) by mouth 2 (two) times daily with a meal. 180 tablet 1    . Multiple Vitamins-Minerals (WOMENS MULTIVITAMIN PO) Take 1 tablet by mouth daily.     Marland Kitchen omeprazole (PRILOSEC) 40 MG capsule TAKE 1 CAPSULE BY MOUTH EVERY DAY 90 capsule 3  . ondansetron (ZOFRAN) 4 MG tablet Take 1 tablet (4 mg total) by mouth every 6 (six) hours as needed for nausea or vomiting. 60 tablet 1  . Spacer/Aero-Holding Chambers (AEROCHAMBER PLUS WITH MASK) inhaler Use as instructed 1 each 2  . amLODipine (NORVASC) 5 MG tablet Take 1 tablet (5 mg total) by  mouth daily. 90 tablet 0  . FLUoxetine (PROZAC) 40 MG capsule Take 1 capsule (40 mg total) by mouth every morning. 30 capsule 2  . ibuprofen (ADVIL) 800 MG tablet Take 1 tablet (800 mg total) by mouth every 8 (eight) hours as needed. 30 tablet 1   No facility-administered medications prior to visit.    Allergies  Allergen Reactions  . Clindamycin/Lincomycin Nausea Only  . Other Other (See Comments)    REFUSED BLOOD TRANSFUSION AND BLOOD PRODUCTS  . Hydrocodone Nausea And Vomiting and Other (See Comments)    stomach pain   . Morphine Nausea And Vomiting    ROS Review of Systems  Constitutional: Negative for activity change and appetite change.  Eyes: Negative.   Respiratory: Negative.   Cardiovascular: Negative.   Gastrointestinal: Negative.   Endocrine: Negative for polydipsia, polyphagia and polyuria.  Musculoskeletal: Positive for arthralgias and back pain.  Skin: Negative.   Hematological: Negative.   Psychiatric/Behavioral: Negative.       Objective:    Physical Exam Constitutional:      Appearance: Normal appearance.  Eyes:     Pupils: Pupils are equal, round, and reactive to light.  Cardiovascular:     Rate and Rhythm: Normal rate and regular rhythm.     Pulses: Normal pulses.  Pulmonary:     Effort: Pulmonary effort is normal.     Breath sounds: Normal breath sounds.  Abdominal:     General: Abdomen is flat.  Skin:    General: Skin is warm.  Neurological:     General: No focal deficit  present.     Mental Status: She is alert. Mental status is at baseline.  Psychiatric:        Attention and Perception: She does not perceive auditory or visual hallucinations.        Mood and Affect: Affect is labile.        Speech: Speech is slurred.        Behavior: Behavior is agitated.        Thought Content: Thought content is delusional. Thought content does not include homicidal or suicidal ideation.        Judgment: Judgment normal.     BP 129/86 (BP Location: Left Arm, Patient Position: Sitting, Cuff Size: Large)   Pulse 87   Temp 98.3 F (36.8 C) (Oral)   Resp 16   Ht 5\' 2"  (1.575 m)   Wt 208 lb (94.3 kg)   LMP 09/14/2019 (Approximate)   SpO2 96%   BMI 38.04 kg/m  Wt Readings from Last 3 Encounters:  04/05/20 209 lb (94.8 kg)  03/29/20 209 lb (94.8 kg)  03/23/20 208 lb (94.3 kg)     Health Maintenance Due  Topic Date Due  . COVID-19 Vaccine (1) Never done  . INFLUENZA VACCINE  03/21/2020    There are no preventive care reminders to display for this patient.  Lab Results  Component Value Date   TSH 1.610 12/24/2019   Lab Results  Component Value Date   WBC 14.2 (H) 01/22/2020   HGB 14.6 01/22/2020   HCT 45.4 01/22/2020   MCV 94.0 01/22/2020   PLT 210 01/22/2020   Lab Results  Component Value Date   NA 138 01/22/2020   K 3.6 01/22/2020   CO2 28 01/22/2020   GLUCOSE 145 (H) 01/22/2020   BUN 13 01/22/2020   CREATININE 0.61 01/22/2020   BILITOT 0.7 01/22/2020   ALKPHOS 108 01/22/2020   AST 41 01/22/2020   ALT  47 (H) 01/22/2020   PROT 7.2 01/22/2020   ALBUMIN 4.0 01/22/2020   CALCIUM 8.8 (L) 01/22/2020   ANIONGAP 9 01/22/2020   Lab Results  Component Value Date   CHOL 216 (H) 11/23/2016   Lab Results  Component Value Date   HDL 47 (L) 11/23/2016   Lab Results  Component Value Date   LDLCALC 151 (H) 11/23/2016   Lab Results  Component Value Date   TRIG 92 11/23/2016   Lab Results  Component Value Date   CHOLHDL 4.6 11/23/2016    Lab Results  Component Value Date   HGBA1C 6.2 (A) 02/17/2020   HGBA1C 6.2 02/17/2020   HGBA1C 6.2 02/17/2020   HGBA1C 6.2 02/17/2020      Assessment & Plan:   Problem List Items Addressed This Visit      Other   Environmental allergies    Other Visit Diagnoses    Essential hypertension    -  Primary   Relevant Medications   amLODipine (NORVASC) 5 MG tablet   Mild intermittent asthma, unspecified whether complicated       Insomnia, unspecified type       Tobacco dependence          Meds ordered this encounter  Medications  . zolpidem (AMBIEN) 5 MG tablet    Sig: Take 1 tablet (5 mg total) by mouth at bedtime as needed for up to 5 days for sleep.    Dispense:  5 tablet    Refill:  0    Order Specific Question:   Supervising Provider    Answer:   Tresa Garter [id number]  . ibuprofen (ADVIL) 800 MG tablet    Sig: Take 1 tablet (800 mg total) by mouth every 8 (eight) hours as needed.    Dispense:  30 tablet    Refill:  5    Order Specific Question:   Supervising Provider    Answer:   Tresa Garter [id number]  . amLODipine (NORVASC) 5 MG tablet    Sig: Take 1 tablet (5 mg total) by mouth daily.    Dispense:  90 tablet    Refill:  1    Order Specific Question:   Supervising Provider    Answer:   Tresa Garter [id number]   Polysubstance dependence including opioid type drug with complication, episodic abuse, with unspecified complication ([place]) Discussed pain management at length. Patient will not receive opiate medications from this clinic. Recommend Tylenol 1000 mg every 6 hours as needed for mild to moderate pain. Patient became upset and stated that her problem was not being addressed.   Essential hypertension BP 129/86 (BP Location: Left Arm, Patient Position: Sitting, Cuff Size: Large)   Pulse 87   Temp 98.3 F (36.8 C) (Oral)   Resp 16   Ht 5\' 2"  (1.575 m)   Wt 208 lb (94.3 kg)   LMP 09/14/2019 (Approximate)   SpO2 96%   BMI 38.04  kg/m  - Continue medication, monitor blood pressure at home. Continue DASH diet. Reminder to go to the ER if any CP, SOB, nausea, dizziness, severe HA, changes vision/speech, left arm numbness and tingling and jaw pain.    - amLODipine (NORVASC) 5 MG tablet; Take 1 tablet (5 mg total) by mouth daily.  Dispense: 90 tablet; Refill: 1   Mild intermittent asthma, unspecified whether complicated Asthma well controlled, no medications changes warranted.   Insomnia, unspecified type Patient is complaining of insomnia. However, she naps during the day periodically.  Discussed sleep hygiene at length. Patient was not interested in sleep hygiene interventions  - zolpidem (AMBIEN) 5 MG tablet; Take 1 tablet (5 mg total) by mouth at bedtime as needed for up to 5 days for sleep.  Dispense: 5 tablet; Refill: 0  Tobacco dependence Smoking cessation instruction/counseling given:  counseled patient on the dangers of tobacco use, advised patient to stop smoking, and reviewed strategies to maximize success Follow-up: Return in about 3 months (around 06/23/2020).   [name]  APRN, MSN, FNP-C [facility] [address] [place], [place] [address] [phone]

## 2020-04-07 NOTE — Telephone Encounter (Signed)
1. Have you developed a fever since your procedure? no  2.   Have you had an respiratory symptoms (SOB or cough) since your procedure? no  3.   Have you tested positive for COVID 19 since your procedure no  4.   Have you had any family members/close contacts diagnosed with the COVID 19 since your procedure?  no   If yes to any of these questions please route to Joylene John, RN and Joella Prince, RN Follow up Call-  Call back number 04/05/2020  Post procedure Call Back phone  # [phone]  Permission to leave phone message Yes  Some recent data might be hidden     Patient questions:  Do you have a fever, pain , or abdominal swelling? No. Pain Score  0 *  Have you tolerated food without any problems? No.  Have you been able to return to your normal activities? Yes.    Do you have any questions about your discharge instructions: Diet   No. Medications  No. Follow up visit  No.  Do you have questions or concerns about your Care? No.  Actions: * If pain score is 4 or above: on f/u call pt says she is still having nausea and has vomited a couple of times since her procedure. This was reason for procedure done 2 days ago and biopsies were done ; therefore,encouraged pt to call back or see PCP if vomiting does not subside or unable to keep liquids down for concern of dehydration. Meanwhile waiting for pathology results from Dr Loletha Carrow. No action needed, pain <4.

## 2020-04-07 NOTE — Telephone Encounter (Signed)
Thank you for the note.  - HD 

## 2020-04-09 ENCOUNTER — Encounter: Payer: Self-pay | Admitting: Gastroenterology

## 2020-04-16 ENCOUNTER — Other Ambulatory Visit: Payer: Self-pay | Admitting: Family Medicine

## 2020-04-16 ENCOUNTER — Telehealth: Payer: Self-pay | Admitting: Family Medicine

## 2020-04-16 DIAGNOSIS — R110 Nausea: Secondary | ICD-10-CM

## 2020-04-16 MED ORDER — CLONIDINE HCL 0.1 MG PO TABS: 0.1000 mg | ORAL_TABLET | Freq: Three times a day (TID) | ORAL | 3 refills | Status: DC

## 2020-04-16 MED ORDER — ONDANSETRON HCL 4 MG PO TABS: 4.0000 mg | ORAL_TABLET | Freq: Four times a day (QID) | ORAL | 1 refills | Status: DC | PRN

## 2020-04-16 NOTE — Progress Notes (Signed)
Meds ordered this encounter  Medications   cloNIDine (CATAPRES) 0.1 MG tablet    Sig: Take 1 tablet (0.1 mg total) by mouth 3 (three) times daily.    Dispense:  60 tablet    Refill:  3    Order Specific Question:   Supervising Provider    Answer:   Tresa Garter [[id number]]   ondansetron (ZOFRAN) 4 MG tablet    Sig: Take 1 tablet (4 mg total) by mouth every 6 (six) hours as needed for nausea or vomiting.    Dispense:  60 tablet    Refill:  1    Order Specific Question:   Supervising Provider    Answer:   Tresa Garter [[id number]]     [name]  APRN, MSN, FNP-C [facility] [address] [place], [place] [address] [phone]

## 2020-04-19 NOTE — Telephone Encounter (Signed)
DOne

## 2020-05-05 ENCOUNTER — Ambulatory Visit: Payer: Medicaid Other | Admitting: Nurse Practitioner

## 2020-05-10 ENCOUNTER — Ambulatory Visit: Payer: Medicaid Other | Admitting: Nurse Practitioner

## 2020-05-11 ENCOUNTER — Other Ambulatory Visit: Payer: Self-pay | Admitting: Family Medicine

## 2020-05-12 ENCOUNTER — Telehealth: Payer: Self-pay | Admitting: Nurse Practitioner

## 2020-05-12 ENCOUNTER — Other Ambulatory Visit: Payer: Self-pay | Admitting: Nurse Practitioner

## 2020-05-12 MED ORDER — GABAPENTIN 400 MG PO CAPS: 800.0000 mg | ORAL_CAPSULE | Freq: Three times a day (TID) | ORAL | 11 refills | Status: DC

## 2020-05-12 NOTE — Telephone Encounter (Signed)
sent 

## 2020-05-17 ENCOUNTER — Ambulatory Visit (INDEPENDENT_AMBULATORY_CARE_PROVIDER_SITE_OTHER): Payer: Medicaid Other | Admitting: Nurse Practitioner

## 2020-05-17 ENCOUNTER — Encounter: Payer: Self-pay | Admitting: Nurse Practitioner

## 2020-05-17 ENCOUNTER — Other Ambulatory Visit: Payer: Self-pay

## 2020-05-17 VITALS — BP 128/83 | HR 94 | Temp 97.5°F | Resp 20 | Ht 62.0 in | Wt 204.4 lb

## 2020-05-17 DIAGNOSIS — Z9109 Other allergy status, other than to drugs and biological substances: Secondary | ICD-10-CM

## 2020-05-17 DIAGNOSIS — R110 Nausea: Secondary | ICD-10-CM

## 2020-05-17 DIAGNOSIS — I10 Essential (primary) hypertension: Secondary | ICD-10-CM | POA: Diagnosis not present

## 2020-05-17 DIAGNOSIS — N898 Other specified noninflammatory disorders of vagina: Secondary | ICD-10-CM

## 2020-05-17 DIAGNOSIS — R1030 Lower abdominal pain, unspecified: Secondary | ICD-10-CM

## 2020-05-17 DIAGNOSIS — R600 Localized edema: Secondary | ICD-10-CM | POA: Diagnosis not present

## 2020-05-17 DIAGNOSIS — E119 Type 2 diabetes mellitus without complications: Secondary | ICD-10-CM

## 2020-05-17 DIAGNOSIS — M25561 Pain in right knee: Secondary | ICD-10-CM

## 2020-05-17 DIAGNOSIS — F1920 Other psychoactive substance dependence, uncomplicated: Secondary | ICD-10-CM

## 2020-05-17 LAB — POCT URINALYSIS DIPSTICK
Bilirubin, UA: NEGATIVE
Blood, UA: NEGATIVE
Glucose, UA: NEGATIVE
Ketones, UA: NEGATIVE
Leukocytes, UA: NEGATIVE
Nitrite, UA: NEGATIVE
Protein, UA: NEGATIVE
Spec Grav, UA: 1.02 (ref 1.010–1.025)
Urobilinogen, UA: 0.2 E.U./dL
pH, UA: 5.5 (ref 5.0–8.0)

## 2020-05-17 MED ORDER — CLONIDINE HCL 0.1 MG PO TABS: 0.1000 mg | ORAL_TABLET | Freq: Three times a day (TID) | ORAL | 11 refills | Status: DC

## 2020-05-17 MED ORDER — PROAIR RESPICLICK 108 (90 BASE) MCG/ACT IN AEPB: 2.0000 | INHALATION_SPRAY | Freq: Four times a day (QID) | RESPIRATORY_TRACT | 5 refills | Status: DC | PRN

## 2020-05-17 MED ORDER — FLUCONAZOLE 150 MG PO TABS: 150.0000 mg | ORAL_TABLET | Freq: Once | ORAL | 0 refills | Status: AC

## 2020-05-17 MED ORDER — LATANOPROST 0.005 % OP SOLN: 1.0000 [drp] | Freq: Every day | OPHTHALMIC | 0 refills | Status: DC

## 2020-05-17 MED ORDER — AMLODIPINE BESYLATE 10 MG PO TABS: 10.0000 mg | ORAL_TABLET | Freq: Every day | ORAL | 3 refills | Status: DC

## 2020-05-17 MED ORDER — FLUTICASONE PROPIONATE 50 MCG/ACT NA SUSP: NASAL | 11 refills | Status: DC

## 2020-05-17 MED ORDER — METFORMIN HCL 500 MG PO TABS: 500.0000 mg | ORAL_TABLET | Freq: Two times a day (BID) | ORAL | 3 refills | Status: DC

## 2020-05-17 MED ORDER — ONDANSETRON HCL 4 MG PO TABS: 4.0000 mg | ORAL_TABLET | Freq: Four times a day (QID) | ORAL | 2 refills | Status: DC | PRN

## 2020-05-17 MED ORDER — OMEPRAZOLE 40 MG PO CPDR: 40.0000 mg | DELAYED_RELEASE_CAPSULE | Freq: Every day | ORAL | 3 refills | Status: DC

## 2020-05-17 MED ORDER — FUROSEMIDE 20 MG PO TABS: 20.0000 mg | ORAL_TABLET | Freq: Every day | ORAL | 3 refills | Status: DC

## 2020-05-17 MED ORDER — FLUOXETINE HCL 40 MG PO CAPS: 40.0000 mg | ORAL_CAPSULE | Freq: Every morning | ORAL | 0 refills | Status: DC

## 2020-05-17 MED ORDER — METRONIDAZOLE 500 MG PO TABS: 500.0000 mg | ORAL_TABLET | Freq: Three times a day (TID) | ORAL | 0 refills | Status: AC

## 2020-05-17 MED ORDER — IBUPROFEN 800 MG PO TABS: 800.0000 mg | ORAL_TABLET | Freq: Three times a day (TID) | ORAL | 5 refills | Status: DC | PRN

## 2020-05-17 MED ORDER — GABAPENTIN 400 MG PO CAPS: 800.0000 mg | ORAL_CAPSULE | Freq: Three times a day (TID) | ORAL | 11 refills | Status: DC

## 2020-05-17 MED ORDER — CETIRIZINE HCL 10 MG PO TABS: 10.0000 mg | ORAL_TABLET | Freq: Every day | ORAL | 3 refills | Status: DC

## 2020-05-17 MED ORDER — DULOXETINE HCL 60 MG PO CPEP: 60.0000 mg | ORAL_CAPSULE | Freq: Two times a day (BID) | ORAL | 2 refills | Status: DC

## 2020-05-17 NOTE — Patient Instructions (Addendum)
Edema  Edema is when you have too much fluid in your body or under your skin. Edema may make your legs, feet, and ankles swell up. Swelling is also common in looser tissues, like around your eyes. This is a common condition. It gets more common as you get older. There are many possible causes of edema. Eating too much salt (sodium) and being on your feet or sitting for a long time can cause edema in your legs, feet, and ankles. Hot weather may make edema worse. Edema is usually painless. Your skin may look swollen or shiny. Follow these instructions at home:  Keep the swollen body part raised (elevated) above the level of your heart when you are sitting or lying down.  Do not sit still or stand for a long time.  Do not wear tight clothes. Do not wear garters on your upper legs.  Exercise your legs. This can help the swelling go down.  Wear elastic bandages or support stockings as told by your doctor.  Eat a low-salt (low-sodium) diet to reduce fluid as told by your doctor.  Depending on the cause of your swelling, you may need to limit how much fluid you drink (fluid restriction).  Take over-the-counter and prescription medicines only as told by your doctor. Contact a doctor if:  Treatment is not working.  You have heart, liver, or kidney disease and have symptoms of edema.  You have sudden and unexplained weight gain. Get help right away if:  You have shortness of breath or chest pain.  You cannot breathe when you lie down.  You have pain, redness, or warmth in the swollen areas.  You have heart, liver, or kidney disease and get edema all of a sudden.  You have a fever and your symptoms get worse all of a sudden. Summary  Edema is when you have too much fluid in your body or under your skin.  Edema may make your legs, feet, and ankles swell up. Swelling is also common in looser tissues, like around your eyes.  Raise (elevate) the swollen body part above the level of your  heart when you are sitting or lying down.  Follow your doctor's instructions about diet and how much fluid you can drink (fluid restriction). This information is not intended to replace advice given to you by your health care provider. Make sure you discuss any questions you have with your health care provider. Document Revised: 08/10/2017 Document Reviewed: 08/25/2016 Elsevier Patient Education  [employer].   Vaginitis Vaginitis is a condition in which the vaginal tissue swells and becomes red (inflamed). This condition is most often caused by a change in the normal balance of bacteria and yeast that live in the vagina. This change causes an overgrowth of certain bacteria or yeast, which causes the inflammation. There are different types of vaginitis, but the most common types are:  Bacterial vaginosis.  Yeast infection (candidiasis).  Trichomoniasis vaginitis. This is a sexually transmitted disease (STD).  Viral vaginitis.  Atrophic vaginitis.  Allergic vaginitis. What are the causes? The cause of this condition depends on the type of vaginitis. It can be caused by:  Bacteria (bacterial vaginosis).  Yeast, which is a fungus (yeast infection).  A parasite (trichomoniasis vaginitis).  A virus (viral vaginitis).  Low hormone levels (atrophic vaginitis). Low hormone levels can occur during pregnancy, breastfeeding, or after menopause.  Irritants, such as bubble baths, scented tampons, and feminine sprays (allergic vaginitis). Other factors can change the normal balance  of the yeast and bacteria that live in the vagina. These include:  Antibiotic medicines.  Poor hygiene.  Diaphragms, vaginal sponges, spermicides, birth control pills, and intrauterine devices (IUD).  Sex.  Infection.  Uncontrolled diabetes.  A weakened defense (immune) system. What increases the risk? This condition is more likely to develop in women who:  Smoke.  Use vaginal douches,  scented tampons, or scented sanitary pads.  Wear tight-fitting pants.  Wear thong underwear.  Use oral birth control pills or an IUD.  Have sex without a condom.  Have multiple sex partners.  Have an STD.  Frequently use the spermicide nonoxynol-9.  Eat lots of foods high in sugar.  Have uncontrolled diabetes.  Have low estrogen levels.  Have a weakened immune system from an immune disorder or medical treatment.  Are pregnant or breastfeeding. What are the signs or symptoms? Symptoms vary depending on the cause of the vaginitis. Common symptoms include:  Abnormal vaginal discharge. ? The discharge is white, gray, or yellow with bacterial vaginosis. ? The discharge is thick, white, and cheesy with a yeast infection. ? The discharge is frothy and yellow or greenish with trichomoniasis.  A bad vaginal smell. The smell is fishy with bacterial vaginosis.  Vaginal itching, pain, or swelling.  Sex that is painful.  Pain or burning when urinating. Sometimes there are no symptoms. How is this diagnosed? This condition is diagnosed based on your symptoms and medical history. A physical exam, including a pelvic exam, will also be done. You may also have other tests, including:  Tests to determine the pH level (acidity or alkalinity) of your vagina.  A whiff test, to assess the odor that results when a sample of your vaginal discharge is mixed with a potassium hydroxide solution.  Tests of vaginal fluid. A sample will be examined under a microscope. How is this treated? Treatment varies depending on the type of vaginitis you have. Your treatment may include:  Antibiotic creams or pills to treat bacterial vaginosis and trichomoniasis.  Antifungal medicines, such as vaginal creams or suppositories, to treat a yeast infection.  Medicine to ease discomfort if you have viral vaginitis. Your sexual partner should also be treated.  Estrogen delivered in a cream, pill,  suppository, or vaginal ring to treat atrophic vaginitis. If vaginal dryness occurs, lubricants and moisturizing creams may help. You may need to avoid scented soaps, sprays, or douches.  Stopping use of a product that is causing allergic vaginitis. Then using a vaginal cream to treat the symptoms. Follow these instructions at home: Lifestyle  Keep your genital area clean and dry. Avoid soap, and only rinse the area with water.  Do not douche or use tampons until your health care provider says it is okay to do so. Use sanitary pads, if needed.  Do not have sex until your health care provider approves. When you can return to sex, practice safe sex and use condoms.  Wipe from front to back. This avoids the spread of bacteria from the rectum to the vagina. General instructions  Take over-the-counter and prescription medicines only as told by your health care provider.  If you were prescribed an antibiotic medicine, take or use it as told by your health care provider. Do not stop taking or using the antibiotic even if you start to feel better.  Keep all follow-up visits as told by your health care provider. This is important. How is this prevented?  Use mild, non-scented products. Do not use things that can  irritate the vagina, such as fabric softeners. Avoid the following products if they are scented: ? Feminine sprays. ? Detergents. ? Tampons. ? Feminine hygiene products. ? Soaps or bubble baths.  Let air reach your genital area. ? Wear cotton underwear to reduce moisture buildup. ? Avoid wearing underwear while you sleep. ? Avoid wearing tight pants and underwear or nylons without a cotton panel. ? Avoid wearing thong underwear.  Take off any wet clothing, such as bathing suits, as soon as possible.  Practice safe sex and use condoms. Contact a health care provider if:  You have abdominal pain.  You have a fever.  You have symptoms that last for more than 2-3 days. Get  help right away if:  You have a fever and your symptoms suddenly get worse. Summary  Vaginitis is a condition in which the vaginal tissue becomes inflamed.This condition is most often caused by a change in the normal balance of bacteria and yeast that live in the vagina.  Treatment varies depending on the type of vaginitis you have.  Do not douche, use tampons , or have sex until your health care provider approves. When you can return to sex, practice safe sex and use condoms. This information is not intended to replace advice given to you by your health care provider. Make sure you discuss any questions you have with your health care provider. Document Revised: 07/20/2017 Document Reviewed: 09/12/2016 Elsevier Patient Education  2020 [employer].

## 2020-05-17 NOTE — Progress Notes (Signed)
[place], [place]  [address] Phone:  [phone]   Fax:  [phone]   Established Patient Office Visit  Subjective:  Patient ID: Leah Barber, female    DOB: 1976/07/25  Age: 44 y.o. MRN: [id number]  CC:  Chief Complaint  Patient presents with  . Follow-up    HPI Leah Barber presents for follow up. She  has a past medical history of Allergy, Anemia, Anxiety, Arthritis, Asthma, Chronic lower back pain, Depression, Diabetes mellitus without complication ([place]), Dyspnea, GERD (gastroesophageal reflux disease), Glaucoma, Hyperlipemia, Hypertension, IIH (idiopathic intracranial hypertension), Pseudotumor cerebri (08/13/2019), and Substance abuse ([place]).   Diabetes Mellitus Patient presents for follow up of diabetes. Current symptoms include: none. Symptoms have stabilized. Patient denies foot ulcerations, hypoglycemia , nausea, polydipsia, polyuria, visual disturbances, vomiting and weight loss. Evaluation to date has included: fasting blood sugar, fasting lipid panel and hemoglobin A1C.  Current treatment: Continued metformin which has been effective.   Hypertension Patient is here for follow-up of elevated blood pressure. She is not exercising and is not adherent to a low-salt diet.  Cardiac symptoms: lower extremity edema. Patient denies chest pain, exertional chest pressure/discomfort, irregular heart beat, palpitations and syncope. Cardiovascular risk factors: diabetes mellitus, hypertension, obesity (BMI >= 30 kg/m2), sedentary lifestyle and smoking/ tobacco exposure. Use of agents associated with hypertension: NSAIDS. History of target organ damage: none.  Past Medical History:  Diagnosis Date  . Allergy   . Anemia   . Anxiety   . Arthritis   . Asthma   . Chronic lower back pain   . Depression   . Diabetes mellitus without complication ([place])   . Dyspnea   . GERD (gastroesophageal reflux disease)   . Glaucoma   . Hyperlipemia    . Hypertension   . IIH (idiopathic intracranial hypertension)   . Pseudotumor cerebri 08/13/2019  . Substance abuse ([place])    Rx drugs for pain medication. Has not had in 5 years.    Past Surgical History:  Procedure Laterality Date  . CESAREAN SECTION     x 3  . CYSTOSCOPY Bilateral 11/25/2019   Procedure: CYSTOSCOPY;  Surgeon: Lavonia Drafts, MD;  Location: [facility];  Service: Gynecology;  Laterality: Bilateral;  . DERMOID CYST  EXCISION    . fluid removed from brain  12/20/2018  . ROBOTIC ASSISTED TOTAL HYSTERECTOMY Bilateral 11/25/2019   Procedure: XI ROBOTIC ASSISTED TOTAL HYSTERECTOMY WITH SALPINGECTOMY;  Surgeon: Lavonia Drafts, MD;  Location: [facility];  Service: Gynecology;  Laterality: Bilateral;    Family History  Problem Relation Age of Onset  . Diabetes Mother   . Hypertension Mother   . Cancer Father   . Liver cancer Father   . Colon cancer Neg Hx   . Esophageal cancer Neg Hx   . Rectal cancer Neg Hx   . Stomach cancer Neg Hx   . Pancreatic cancer Neg Hx     Social History   Socioeconomic History  . Marital status: Legally Separated    Spouse name: Not on file  . Number of children: Not on file  . Years of education: Not on file  . Highest education level: Not on file  Occupational History  . Not on file  Tobacco Use  . Smoking status: Current Every Day Smoker    Packs/day: 1.00    Types: Cigarettes  . Smokeless tobacco: Never Used  Vaping Use  . Vaping Use: Never used  Substance and Sexual Activity  . Alcohol use: Yes    Comment: occasional  . Drug use: No  . Sexual activity: Not Currently  Other Topics Concern  . Not on file  Social History Narrative  . Not on file   Social Determinants of Health   Financial Resource Strain:   . Difficulty of Paying Living Expenses: Not on file  Food Insecurity:   . Worried About Charity fundraiser in the Last Year: Not on file  . Ran Out of Food in  the Last Year: Not on file  Transportation Needs:   . Lack of Transportation (Medical): Not on file  . Lack of Transportation (Non-Medical): Not on file  Physical Activity:   . Days of Exercise per Week: Not on file  . Minutes of Exercise per Session: Not on file  Stress:   . Feeling of Stress : Not on file  Social Connections:   . Frequency of Communication with Friends and Family: Not on file  . Frequency of Social Gatherings with Friends and Family: Not on file  . Attends Religious Services: Not on file  . Active Member of Clubs or Organizations: Not on file  . Attends Archivist Meetings: Not on file  . Marital Status: Not on file  Intimate Partner Violence:   . Fear of Current or Ex-Partner: Not on file  . Emotionally Abused: Not on file  . Physically Abused: Not on file  . Sexually Abused: Not on file    Outpatient Medications Prior to Visit  Medication Sig Dispense Refill  . buprenorphine (SUBUTEX) 8 MG SUBL SL tablet Place 8 mg under the tongue every 8 (eight) hours.    Marland Kitchen Spacer/Aero-Holding Chambers (AEROCHAMBER PLUS WITH MASK) inhaler Use as instructed 1 each 2  . albuterol (VENTOLIN HFA) 108 (90 Base) MCG/ACT inhaler Inhale 2 puffs into the lungs every 4 (four) hours as needed for wheezing or shortness of breath. 6.7 g 2  . amLODipine (NORVASC) 10 MG tablet Take 10 mg by mouth daily.    . cetirizine (ZYRTEC) 10 MG tablet Take 1 tablet (10 mg total) by mouth daily. 30 tablet 11  . cloNIDine (CATAPRES) 0.1 MG tablet Take 1 tablet (0.1 mg total) by mouth 3 (three) times daily. 60 tablet 3  . DULoxetine (CYMBALTA) 60 MG capsule TAKE 1 CAPSULE (60 MG TOTAL) BY MOUTH 2 (TWO) TIMES DAILY. 180 capsule 2  . FLUoxetine (PROZAC) 40 MG capsule Take 1 capsule (40 mg total) by mouth every morning. 30 capsule 2  . fluticasone (FLONASE) 50 MCG/ACT nasal spray INHALE 2 SPRAYS IN BOTH NOSTRILS DAILY (Patient taking differently: Place 2 sprays into both nostrils daily. ) 16 mL 2    . furosemide (LASIX) 20 MG tablet Take 1 tablet (20 mg total) by mouth daily. 90 tablet 1  . gabapentin (NEURONTIN) 400 MG capsule Take 2 capsules (800 mg total) by mouth 3 (three) times daily. 180 capsule 11  . ibuprofen (ADVIL) 800 MG tablet Take 1 tablet (800 mg total) by mouth every 8 (eight) hours as needed. 30 tablet 5  . latanoprost (XALATAN) 0.005 % ophthalmic solution Place 1 drop into both eyes at bedtime. 2.5 mL 0  . metFORMIN (GLUCOPHAGE) 500 MG tablet Take 1 tablet (500 mg total) by mouth 2 (two) times daily with a meal. 180 tablet 1  . omeprazole (PRILOSEC) 40 MG capsule TAKE 1 CAPSULE BY MOUTH EVERY DAY 90 capsule 3  . ondansetron (ZOFRAN) 4 MG tablet Take  1 tablet (4 mg total) by mouth every 6 (six) hours as needed for nausea or vomiting. 60 tablet 1  . baclofen (LIORESAL) 10 MG tablet Take 1 tablet (10 mg total) by mouth 3 (three) times daily. (Patient not taking: Reported on 04/05/2020) 90 each 1  . Buprenorphine HCl-Naloxone HCl 8-2 MG FILM Place 2 Film under the tongue daily.     . CONCERTA 36 MG CR tablet Take 36 mg by mouth daily as needed (ADHD).  (Patient not taking: Reported on 04/05/2020)    . hydrOXYzine (VISTARIL) 50 MG capsule Take 50 mg by mouth 4 (four) times daily as needed for anxiety.  (Patient not taking: Reported on 04/05/2020)    . Multiple Vitamins-Minerals (WOMENS MULTIVITAMIN PO) Take 1 tablet by mouth daily.     Marland Kitchen zolpidem (AMBIEN) 5 MG tablet Take 1 tablet (5 mg total) by mouth at bedtime as needed for up to 5 days for sleep. 5 tablet 0  . amLODipine (NORVASC) 5 MG tablet Take 1 tablet (5 mg total) by mouth daily. 90 tablet 1   No facility-administered medications prior to visit.    Allergies  Allergen Reactions  . Clindamycin/Lincomycin Nausea Only  . Other Other (See Comments)    REFUSED BLOOD TRANSFUSION AND BLOOD PRODUCTS  . Hydrocodone Nausea And Vomiting and Other (See Comments)    stomach pain   . Morphine Nausea And Vomiting    ROS Review  of Systems  Musculoskeletal: Positive for joint swelling (knee pain with weakness).  Neurological:       Admits to over sedation with current regimen but does not want to change her regimen      Objective:    Physical Exam Constitutional:      Appearance: She is obese.  HENT:     Head: Normocephalic and atraumatic.     Nose: Nose normal.     Mouth/Throat:     Mouth: Mucous membranes are moist.  Cardiovascular:     Rate and Rhythm: Normal rate and regular rhythm.     Pulses: Normal pulses.     Heart sounds: Normal heart sounds.  Pulmonary:     Effort: Pulmonary effort is normal.  Abdominal:     Palpations: Abdomen is soft.  Musculoskeletal:        General: Tenderness present.     Cervical back: Normal range of motion.     Right lower leg: Edema present.     Left lower leg: Edema present.  Skin:    General: Skin is warm and dry.     Capillary Refill: Capillary refill takes less than 2 seconds.  Neurological:     Mental Status: She is alert and oriented to person, place, and time.     Comments: Falling asleep during the OV on 2 occasions  Sleeping even while standing on one occasion    Psychiatric:        Mood and Affect: Mood normal.     BP 128/83   Pulse 94   Temp (!) 97.5 F (36.4 C)   Resp 20   Ht 5\' 2"  (1.575 m)   Wt 204 lb 6.4 oz (92.7 kg)   LMP 09/14/2019 (Approximate)   SpO2 96%   BMI 37.39 kg/m  Wt Readings from Last 3 Encounters:  05/17/20 204 lb 6.4 oz (92.7 kg)  04/05/20 209 lb (94.8 kg)  03/29/20 209 lb (94.8 kg)     There are no preventive care reminders to display for this patient.  There  are no preventive care reminders to display for this patient.  Lab Results  Component Value Date   TSH 1.610 12/24/2019   Lab Results  Component Value Date   WBC 14.2 (H) 01/22/2020   HGB 14.6 01/22/2020   HCT 45.4 01/22/2020   MCV 94.0 01/22/2020   PLT 210 01/22/2020   Lab Results  Component Value Date   NA 141 05/17/2020   K 4.1 05/17/2020    CO2 28 01/22/2020   GLUCOSE 88 05/17/2020   BUN 14 05/17/2020   CREATININE 0.87 05/17/2020   BILITOT 0.3 05/17/2020   ALKPHOS 150 (H) 05/17/2020   AST 19 05/17/2020   ALT 47 (H) 01/22/2020   PROT 7.2 05/17/2020   ALBUMIN 4.5 05/17/2020   CALCIUM 9.7 05/17/2020   ANIONGAP 9 01/22/2020   Lab Results  Component Value Date   CHOL 275 (H) 05/17/2020   Lab Results  Component Value Date   HDL 49 05/17/2020   Lab Results  Component Value Date   LDLCALC 201 (H) 05/17/2020   Lab Results  Component Value Date   TRIG 134 05/17/2020   Lab Results  Component Value Date   CHOLHDL 5.6 (H) 05/17/2020   Lab Results  Component Value Date   HGBA1C 6.2 (A) 02/17/2020   HGBA1C 6.2 02/17/2020   HGBA1C 6.2 02/17/2020   HGBA1C 6.2 02/17/2020      Assessment & Plan:   Problem List Items Addressed This Visit      Other   Environmental allergies   Relevant Medications   fluticasone (FLONASE) 50 MCG/ACT nasal spray   Polysubstance dependence ([place]) Encouraged patient that she needs to have her Subutex needs to be adjusted due to oversedation. Patient does not agree.     Other Visit Diagnoses    Essential hypertension    -  Primary Encouraged on going compliance with current medication regimen Encouraged home monitoring and recording BP <130/80 Eating a heart-healthy diet with less salt Encouraged regular physical activity  Recommend Weight loss    Relevant Medications   amLODipine (NORVASC) 10 MG tablet   cloNIDine (CATAPRES) 0.1 MG tablet   furosemide (LASIX) 20 MG tablet   Type 2 diabetes mellitus without complication, without long-term current use of insulin (HCC)  Encourage compliance with current treatment regimen  Encourage regular CBG monitoring Encourage contacting office if excessive hyperglycemia and or hypoglycemia Lifestyle modification with healthy diet (fewer calories, more high fiber foods, whole grains and non-starchy vegetables, lower fat meat and fish,  low-fat diary include healthy oils) regular exercise (physical activity) and weight loss Opthalmology exam discussed  Nutritional consult recommended Regular dental visits encouraged Home BP monitoring also encouraged goal <130/80        Relevant Medications   metFORMIN (GLUCOPHAGE) 500 MG tablet   Other Relevant Orders   Microalbumin, urine (Completed)   Lipid panel (Completed)   Comp. Metabolic Panel (12) (Completed)   Localized edema       Relevant Orders   Brain natriuretic peptide (Completed)   Lower abdominal pain       Relevant Medications   omeprazole (PRILOSEC) 40 MG capsule   Nausea       Relevant Medications   ondansetron (ZOFRAN) 4 MG tablet   Vaginal irritation       Relevant Orders   NuSwab Vaginitis (VG)   POCT urinalysis dipstick (Completed)   Acute pain of right knee       Relevant Orders   DG Knee Complete 4 Views Right  Meds ordered this encounter  Medications  . Albuterol Sulfate (PROAIR RESPICLICK) 824 (90 Base) MCG/ACT AEPB    Sig: Inhale 2 puffs into the lungs 4 (four) times daily as needed.    Dispense:  1 each    Refill:  5    Order Specific Question:   Supervising Provider    Answer:   Tresa Garter [id number]  . amLODipine (NORVASC) 10 MG tablet    Sig: Take 1 tablet (10 mg total) by mouth daily.    Dispense:  90 tablet    Refill:  3    Order Specific Question:   Supervising Provider    Answer:   Tresa Garter [id number]  . cetirizine (ZYRTEC) 10 MG tablet    Sig: Take 1 tablet (10 mg total) by mouth daily.    Dispense:  90 tablet    Refill:  3    Order Specific Question:   Supervising Provider    Answer:   Tresa Garter [id number]  . cloNIDine (CATAPRES) 0.1 MG tablet    Sig: Take 1 tablet (0.1 mg total) by mouth 3 (three) times daily.    Dispense:  90 tablet    Refill:  11    Order Specific Question:   Supervising Provider    Answer:   Tresa Garter [id number]  . DULoxetine (CYMBALTA) 60 MG capsule     Sig: Take 1 capsule (60 mg total) by mouth 2 (two) times daily.    Dispense:  180 capsule    Refill:  2    Order Specific Question:   Supervising Provider    Answer:   Tresa Garter [id number]  . FLUoxetine (PROZAC) 40 MG capsule    Sig: Take 1 capsule (40 mg total) by mouth every morning.    Dispense:  90 capsule    Refill:  0    Order Specific Question:   Supervising Provider    Answer:   Tresa Garter [id number]  . fluticasone (FLONASE) 50 MCG/ACT nasal spray    Sig: INHALE 2 SPRAYS IN BOTH NOSTRILS DAILY    Dispense:  16 mL    Refill:  11    Order Specific Question:   Supervising Provider    Answer:   Tresa Garter [[id number]]  . furosemide (LASIX) 20 MG tablet    Sig: Take 1 tablet (20 mg total) by mouth daily.    Dispense:  90 tablet    Refill:  3    Order Specific Question:   Supervising Provider    Answer:   Tresa Garter [id number]  . gabapentin (NEURONTIN) 400 MG capsule    Sig: Take 2 capsules (800 mg total) by mouth 3 (three) times daily.    Dispense:  180 capsule    Refill:  11    Order Specific Question:   Supervising Provider    Answer:   Tresa Garter [id number]  . ibuprofen (ADVIL) 800 MG tablet    Sig: Take 1 tablet (800 mg total) by mouth every 8 (eight) hours as needed.    Dispense:  30 tablet    Refill:  5    Order Specific Question:   Supervising Provider    Answer:   Tresa Garter [id number]  . latanoprost (XALATAN) 0.005 % ophthalmic solution    Sig: Place 1 drop into both eyes at bedtime.    Dispense:  2.5 mL    Refill:  0    Order Specific Question:   Supervising  Provider    Answer:   Tresa Garter [[id number]]  . metFORMIN (GLUCOPHAGE) 500 MG tablet    Sig: Take 1 tablet (500 mg total) by mouth 2 (two) times daily with a meal.    Dispense:  180 tablet    Refill:  3    Order Specific Question:   Supervising Provider    Answer:   Tresa Garter [id number]  . omeprazole (PRILOSEC) 40 MG capsule    Sig:  Take 1 capsule (40 mg total) by mouth daily.    Dispense:  90 capsule    Refill:  3    Order Specific Question:   Supervising Provider    Answer:   Tresa Garter [id number]  . ondansetron (ZOFRAN) 4 MG tablet    Sig: Take 1 tablet (4 mg total) by mouth every 6 (six) hours as needed for nausea or vomiting.    Dispense:  60 tablet    Refill:  2    Order Specific Question:   Supervising Provider    Answer:   Tresa Garter [id number]  . metroNIDAZOLE (FLAGYL) 500 MG tablet    Sig: Take 1 tablet (500 mg total) by mouth 3 (three) times daily for 7 days.    Dispense:  21 tablet    Refill:  0    Order Specific Question:   Supervising Provider    Answer:   Tresa Garter [id number]  . fluconazole (DIFLUCAN) 150 MG tablet    Sig: Take 1 tablet (150 mg total) by mouth once for 1 dose.    Dispense:  1 tablet    Refill:  0    Order Specific Question:   Supervising Provider    Answer:   Tresa Garter [id number]    Follow-up: No follow-ups on file.    [name], NP

## 2020-05-18 ENCOUNTER — Other Ambulatory Visit: Payer: Self-pay | Admitting: Family Medicine

## 2020-05-18 DIAGNOSIS — Z9109 Other allergy status, other than to drugs and biological substances: Secondary | ICD-10-CM

## 2020-05-18 LAB — LIPID PANEL
Chol/HDL Ratio: 5.6 ratio — ABNORMAL HIGH (ref 0.0–4.4)
Cholesterol, Total: 275 mg/dL — ABNORMAL HIGH (ref 100–199)
HDL: 49 mg/dL (ref >39)
LDL Chol Calc (NIH): 201 mg/dL — ABNORMAL HIGH (ref 0–99)
Triglycerides: 134 mg/dL (ref 0–149)
VLDL Cholesterol Cal: 25 mg/dL (ref 5–40)

## 2020-05-18 LAB — COMP. METABOLIC PANEL (12)
AST: 19 IU/L (ref 0–40)
Albumin/Globulin Ratio: 1.7 (ref 1.2–2.2)
Albumin: 4.5 g/dL (ref 3.8–4.8)
Alkaline Phosphatase: 150 IU/L — ABNORMAL HIGH (ref 44–121)
BUN/Creatinine Ratio: 16 (ref 9–23)
BUN: 14 mg/dL (ref 6–24)
Bilirubin Total: 0.3 mg/dL (ref 0.0–1.2)
Calcium: 9.7 mg/dL (ref 8.7–10.2)
Chloride: 101 mmol/L (ref 96–106)
Creatinine, Ser: 0.87 mg/dL (ref 0.57–1.00)
GFR calc Af Amer: 92 mL/min/{1.73_m2} (ref >59)
GFR calc non Af Amer: 80 mL/min/{1.73_m2} (ref >59)
Globulin, Total: 2.7 g/dL (ref 1.5–4.5)
Glucose: 88 mg/dL (ref 65–99)
Potassium: 4.1 mmol/L (ref 3.5–5.2)
Sodium: 141 mmol/L (ref 134–144)
Total Protein: 7.2 g/dL (ref 6.0–8.5)

## 2020-05-18 LAB — BRAIN NATRIURETIC PEPTIDE: BNP: 3.2 pg/mL (ref 0.0–100.0)

## 2020-05-18 LAB — MICROALBUMIN, URINE: Microalbumin, Urine: 7.1 ug/mL

## 2020-05-18 NOTE — Telephone Encounter (Signed)
Please see RX refill request

## 2020-05-20 ENCOUNTER — Other Ambulatory Visit: Payer: Self-pay | Admitting: Nurse Practitioner

## 2020-05-20 DIAGNOSIS — E7800 Pure hypercholesterolemia, unspecified: Secondary | ICD-10-CM

## 2020-05-20 LAB — NUSWAB VAGINITIS (VG)
BVAB 2: HIGH Score — AB
Candida albicans, NAA: NEGATIVE
Candida glabrata, NAA: NEGATIVE
Megasphaera 1: HIGH Score — AB
Trich vag by NAA: NEGATIVE

## 2020-05-20 MED ORDER — ROSUVASTATIN CALCIUM 5 MG PO TABS: 5.0000 mg | ORAL_TABLET | Freq: Every day | ORAL | 11 refills | Status: DC

## 2020-06-02 ENCOUNTER — Ambulatory Visit: Payer: Medicaid Other | Admitting: Nurse Practitioner

## 2020-06-15 ENCOUNTER — Telehealth: Payer: Self-pay | Admitting: Nurse Practitioner

## 2020-06-15 ENCOUNTER — Other Ambulatory Visit: Payer: Self-pay | Admitting: Nurse Practitioner

## 2020-06-15 MED ORDER — IBUPROFEN 800 MG PO TABS: 800.0000 mg | ORAL_TABLET | Freq: Three times a day (TID) | ORAL | 5 refills | Status: DC | PRN

## 2020-06-15 NOTE — Progress Notes (Signed)
   [facility] [address] [place], [place]  [address] Phone:  [phone]   Fax:  [phone] 

## 2020-06-15 NOTE — Telephone Encounter (Signed)
This was refilled at her FU visit on 04/2020 with 5 refills  She needs to call the pharmacy

## 2020-06-15 NOTE — Telephone Encounter (Signed)
Resent to [facility]. ?

## 2020-06-15 NOTE — Telephone Encounter (Signed)
Done

## 2020-06-16 ENCOUNTER — Ambulatory Visit: Payer: Medicaid Other | Admitting: Neurology

## 2020-06-16 ENCOUNTER — Telehealth: Payer: Self-pay | Admitting: Neurology

## 2020-06-16 ENCOUNTER — Encounter: Payer: Self-pay | Admitting: Neurology

## 2020-06-16 NOTE — Telephone Encounter (Signed)
This is the second revisit no-show for this patient, she also no showed on 23 March 2020.

## 2020-06-17 ENCOUNTER — Telehealth (INDEPENDENT_AMBULATORY_CARE_PROVIDER_SITE_OTHER): Payer: Medicaid Other | Admitting: Nurse Practitioner

## 2020-06-17 DIAGNOSIS — G894 Chronic pain syndrome: Secondary | ICD-10-CM

## 2020-06-17 DIAGNOSIS — M545 Low back pain, unspecified: Secondary | ICD-10-CM

## 2020-06-17 DIAGNOSIS — J42 Unspecified chronic bronchitis: Secondary | ICD-10-CM

## 2020-06-17 DIAGNOSIS — K5901 Slow transit constipation: Secondary | ICD-10-CM

## 2020-06-17 DIAGNOSIS — R0602 Shortness of breath: Secondary | ICD-10-CM | POA: Diagnosis not present

## 2020-06-17 DIAGNOSIS — R062 Wheezing: Secondary | ICD-10-CM

## 2020-06-17 DIAGNOSIS — K625 Hemorrhage of anus and rectum: Secondary | ICD-10-CM | POA: Diagnosis not present

## 2020-06-17 DIAGNOSIS — N898 Other specified noninflammatory disorders of vagina: Secondary | ICD-10-CM

## 2020-06-17 DIAGNOSIS — G8929 Other chronic pain: Secondary | ICD-10-CM

## 2020-06-17 NOTE — Progress Notes (Signed)
   [place], [place]  [address] Phone:  [phone]   Fax:  [phone] Virtual Visit via Telephone Note  I connected with Leah Barber on 06/18/20 at  9:00 AM EDT by telephone and verified that I am speaking with the correct person using two identifiers.   I discussed the limitations, risks, security and privacy concerns of performing an evaluation and management service by telephone and the availability of in person appointments. I also discussed with the patient that there may be a patient responsible charge related to this service. The patient expressed understanding and agreed to proceed.  Patient home Provider Office  History of Present Illness:  Blood in Stool Patient presents for presents evaluation of blood in rectal bleeding. Patient has associated symptoms of constipation. The patient denies visible blood: just notes blood on TP. She having in fecal loss for 1-2 months. The patient has a known history of: colon polyps on recent colonscopy 04/05/20. The patient has had 0 episodes of rectal bleeding. There is not a history of rectal injury. Patient has not had similar episodes of rectal bleeding in the past. She is having some left abdominal pain that had her doubled over.    She has wheezing and shortness of breath. She continues to smoke cigarettes. She admits that she has a area of concern on her lungs.   Observations/Objective: No exam   Assessment and Plan: Assessment  Primary Diagnosis & Pertinent Problem List: The primary encounter diagnosis was Shortness of breath. Diagnoses of Bright red rectal bleeding, Chronic bronchitis, unspecified chronic bronchitis type (HCC), Slow transit constipation, Vaginal dryness, Wheezing, Chronic pain syndrome, and Chronic midline low back pain, unspecified whether sciatica present were also pertinent to this visit.  Visit Diagnosis: 1. Shortness of breath  Chest xray pending Discussed the  risk factors associated with smoking ; CAD, COPD, Cancer, PVD increased susceptibility to respiratory illnesses Discussed treatment options with cessation ie counseling, support resources and available medications  Choosing a quit day and setting goals accordingly. Discussed ways to quit; start by decreasing one cigarette per day or per week.  Encourage patient to call for assistance once ready to quit. Provided education handouts   Counseling 5-10 minutes   2. Bright red rectal bleeding  Referral to GI for further evaluation. If symptoms persist or get worse the go to ER.    3. Chronic bronchitis, unspecified chronic bronchitis type ([place])   4. Slow transit constipation   5. Vaginal dryness   6. Wheezing   7. Chronic pain syndrome   8. Chronic midline low back pain, unspecified whether sciatica present    Follow Up Instructions:    I discussed the assessment and treatment plan with the patient. The patient was provided an opportunity to ask questions and all were answered. The patient agreed with the plan and demonstrated an understanding of the instructions.   The patient was advised to call back or seek an in-person evaluation if the symptoms worsen or if the condition fails to improve as anticipated.  I provided 21 minutes of non-face-to-face time during this encounter.   [name], NP

## 2020-06-18 ENCOUNTER — Encounter: Payer: Self-pay | Admitting: Nurse Practitioner

## 2020-06-22 ENCOUNTER — Encounter: Payer: Self-pay | Admitting: Physical Medicine & Rehabilitation

## 2020-06-29 ENCOUNTER — Ambulatory Visit: Payer: Medicaid Other | Admitting: Gastroenterology

## 2020-06-30 ENCOUNTER — Ambulatory Visit: Payer: Medicaid Other | Admitting: Gastroenterology

## 2020-07-08 ENCOUNTER — Encounter: Payer: Medicaid Other | Admitting: Physical Medicine & Rehabilitation

## 2020-07-28 ENCOUNTER — Ambulatory Visit (HOSPITAL_COMMUNITY)
Admission: RE | Admit: 2020-07-28 | Discharge: 2020-07-28 | Disposition: A | Discharge status: Home / Self Care | Payer: Medicaid Other | Source: Ambulatory Visit | Attending: Nurse Practitioner | Admitting: Nurse Practitioner

## 2020-07-28 ENCOUNTER — Other Ambulatory Visit: Payer: Self-pay

## 2020-07-28 DIAGNOSIS — R0602 Shortness of breath: Secondary | ICD-10-CM | POA: Diagnosis present

## 2020-07-30 ENCOUNTER — Ambulatory Visit: Payer: Medicaid Other | Admitting: Nurse Practitioner

## 2020-08-02 ENCOUNTER — Other Ambulatory Visit: Payer: Self-pay | Admitting: Nurse Practitioner

## 2020-08-02 MED ORDER — AMOXICILLIN-POT CLAVULANATE 875-125 MG PO TABS: 1.0000 | ORAL_TABLET | Freq: Two times a day (BID) | ORAL | 0 refills | Status: DC

## 2020-08-05 ENCOUNTER — Ambulatory Visit (INDEPENDENT_AMBULATORY_CARE_PROVIDER_SITE_OTHER): Payer: Medicaid Other | Admitting: Nurse Practitioner

## 2020-08-05 ENCOUNTER — Other Ambulatory Visit: Payer: Self-pay

## 2020-08-05 ENCOUNTER — Encounter: Payer: Self-pay | Admitting: Nurse Practitioner

## 2020-08-05 VITALS — BP 129/85 | HR 82 | Temp 97.5°F | Ht 62.0 in | Wt 205.0 lb

## 2020-08-05 DIAGNOSIS — I10 Essential (primary) hypertension: Secondary | ICD-10-CM | POA: Diagnosis not present

## 2020-08-05 DIAGNOSIS — R609 Edema, unspecified: Secondary | ICD-10-CM | POA: Diagnosis not present

## 2020-08-05 DIAGNOSIS — Z113 Encounter for screening for infections with a predominantly sexual mode of transmission: Secondary | ICD-10-CM | POA: Diagnosis not present

## 2020-08-05 DIAGNOSIS — B379 Candidiasis, unspecified: Secondary | ICD-10-CM

## 2020-08-05 DIAGNOSIS — R400 Somnolence: Secondary | ICD-10-CM

## 2020-08-05 DIAGNOSIS — F17209 Nicotine dependence, unspecified, with unspecified nicotine-induced disorders: Secondary | ICD-10-CM

## 2020-08-05 DIAGNOSIS — N898 Other specified noninflammatory disorders of vagina: Secondary | ICD-10-CM | POA: Diagnosis not present

## 2020-08-05 LAB — POCT URINALYSIS DIPSTICK
Bilirubin, UA: NEGATIVE
Blood, UA: NEGATIVE
Glucose, UA: NEGATIVE
Ketones, UA: NEGATIVE
Leukocytes, UA: NEGATIVE
Nitrite, UA: NEGATIVE
Protein, UA: NEGATIVE
Spec Grav, UA: 1.025 (ref 1.010–1.025)
Urobilinogen, UA: 0.2 E.U./dL
pH, UA: 5.5 (ref 5.0–8.0)

## 2020-08-05 MED ORDER — SPIRONOLACTONE 25 MG PO TABS: 25.0000 mg | ORAL_TABLET | Freq: Every day | ORAL | 0 refills | Status: DC

## 2020-08-05 NOTE — Patient Instructions (Signed)
Bartholin's Cyst  A Bartholin's cyst is a fluid-filled sac that forms on a Bartholin's gland. Bartholin's glands are small glands in the folds of skin around the opening of the vagina (labia). This type of cyst causes a bulge or lump near the lower opening of the vagina. If you have a cyst that is small and not infected, you may be able to take care of it at home. If your cyst gets infected, it may cause pain and your doctor may need to drain it. An infected Bartholin's cyst is called a Bartholin's abscess. Follow these instructions at home: Medicines  Take over-the-counter and prescription medicines only as told by your doctor.  If you were prescribed an antibiotic medicine, take it as told by your doctor. Do not stop taking the antibiotic even if you start to feel better. Managing pain and swelling  Try sitz baths to help with pain and swelling. A sitz bath is a warm water bath in which the water only comes up to your hips and should cover your buttocks. You may take sitz baths a few times a day.  Put heat on the affected area as often as needed. Use the heat source that your doctor recommends, such as a moist heat pack or a heating pad. ? Place a towel between your skin and the heat source. ? Leave the heat on for 20-30 minutes. ? Remove the heat if your skin turns bright red. This is especially important if you cannot feel pain, heat, or cold. You may have a greater risk of getting burned. General instructions  If your cyst or abscess was drained: ? Follow instructions from your doctor about how to take care of your wound. ? Use feminine pads to absorb any fluid.  Do not push on or squeeze your cyst.  Do not have sex until the cyst has gone away or your wound from drainage has healed.  Take these steps to help prevent a Bartholin's cyst from returning, and to prevent other Bartholin's cysts from forming: ? Take a bath or shower once a day. Clean your vaginal area with mild soap and  water when you bathe. ? Practice safe sex to prevent STIs (sexually transmitted infections). Talk with your doctor about how to prevent STIs and which forms of birth control (contraception) may be best for you.  Keep all follow-up visits as told by your doctor. This is important. Contact a doctor if:  You have a fever.  You get redness, swelling, or pain around your cyst.  You have fluid, blood, pus, or a bad smell coming from your cyst.  You have a cyst that gets larger or comes back. Summary  A Bartholin's cyst is a fluid-filled sac that forms on a Bartholin's gland. These small glands are found in the folds of skin around the opening of the vagina (labia).  This type of cyst causes a bulge or lump near the lower opening of the vagina. An infected Bartholin's cyst is called a Bartholin's abscess.  Try sitz baths a few times a day to help with pain and swelling.  Do not push on or squeeze your cyst. This information is not intended to replace advice given to you by your health care provider. Make sure you discuss any questions you have with your health care provider. Document Revised: 05/30/2018 Document Reviewed: 05/09/2017 Elsevier Patient Education  [place].

## 2020-08-05 NOTE — Progress Notes (Signed)
[place], [place]  [address] Phone:  [phone]   Fax:  [phone]   Established Patient Office Visit  Subjective:  Patient ID: Leah Barber, female    DOB: [birth year]  Age: 44 y.o. MRN: [id number]  CC:  Chief Complaint  Patient presents with   Follow-up    HPI Leah Barber presents for follow up. She  has a past medical history of Allergy, Anemia, Anxiety, Arthritis, Asthma, Chronic lower back pain, Depression, Diabetes mellitus without complication ([place]), Dyspnea, GERD (gastroesophageal reflux disease), Glaucoma, Hyperlipemia, Hypertension, IIH (idiopathic intracranial hypertension), Pseudotumor cerebri (08/13/2019), and Substance abuse ([place]).   Vaginitis Patient presents for evaluation of an abnormal vaginal discharge. Symptoms have been present for several days. Vaginal symptoms: local irritation. Contraception: none. She denies abnormal bleeding, dyspareunia, pain and post coital bleeding. Sexually transmitted infection risk: possible STD exposure. Menstrual flow: menopause.  Edema Patient complains of edema in both lower legs. The edema has been moderate. Onset of symptoms was several months ago, and patient reports symptoms have gradually improved since that time. The edema is present intermittently. The patient states the problem is long-standing. The swelling has been aggravated by dependency of involved area. The swelling has been relieved by diuretics. Associated factors include: use of calcium channel blockers. Cardiac risk factors include dyslipidemia, hypertension, obesity (BMI >= 30 kg/m2), sedentary lifestyle and smoking/ tobacco exposure.   Past Medical History:  Diagnosis Date   Allergy    Anemia    Anxiety    Arthritis    Asthma    Chronic lower back pain    Depression    Diabetes mellitus without complication (HCC)    Dyspnea    GERD (gastroesophageal reflux disease)    Glaucoma     Hyperlipemia    Hypertension    IIH (idiopathic intracranial hypertension)    Pseudotumor cerebri 08/13/2019   Substance abuse ([place])    Rx drugs for pain medication. Has not had in 5 years.    Past Surgical History:  Procedure Laterality Date   CESAREAN SECTION     x 3   CYSTOSCOPY Bilateral 11/25/2019   Procedure: CYSTOSCOPY;  Surgeon: Lavonia Drafts, MD;  Location: [facility];  Service: Gynecology;  Laterality: Bilateral;   DERMOID CYST  EXCISION     fluid removed from brain  12/20/2018   ROBOTIC ASSISTED TOTAL HYSTERECTOMY Bilateral 11/25/2019   Procedure: XI ROBOTIC ASSISTED TOTAL HYSTERECTOMY WITH SALPINGECTOMY;  Surgeon: Lavonia Drafts, MD;  Location: [facility];  Service: Gynecology;  Laterality: Bilateral;    Family History  Problem Relation Age of Onset   Diabetes Mother    Hypertension Mother    Cancer Father    Liver cancer Father    Colon cancer Neg Hx    Esophageal cancer Neg Hx    Rectal cancer Neg Hx    Stomach cancer Neg Hx    Pancreatic cancer Neg Hx     Social History   Socioeconomic History   Marital status: Legally Separated    Spouse name: Not on file   Number of children: Not on file   Years of education: Not on file   Highest education level: Not on file  Occupational History   Not on file  Tobacco Use   Smoking status: Current Every Day Smoker    Packs/day: 1.00    Types: Cigarettes   Smokeless tobacco: Never Used  Vaping Use   Vaping Use:  Never used  Substance and Sexual Activity   Alcohol use: Yes    Comment: occasional   Drug use: No   Sexual activity: Not Currently  Other Topics Concern   Not on file  Social History Narrative   Not on file   Social Determinants of Health   Financial Resource Strain: Not on file  Food Insecurity: Not on file  Transportation Needs: Not on file  Physical Activity: Not on file  Stress: Not on file  Social Connections:  Not on file  Intimate Partner Violence: Not on file    Outpatient Medications Prior to Visit  Medication Sig Dispense Refill   Albuterol Sulfate (PROAIR RESPICLICK) 542 (90 Base) MCG/ACT AEPB Inhale 2 puffs into the lungs 4 (four) times daily as needed. 1 each 5   amLODipine (NORVASC) 10 MG tablet Take 1 tablet (10 mg total) by mouth daily. 90 tablet 3   amoxicillin-clavulanate (AUGMENTIN) 875-125 MG tablet Take 1 tablet by mouth 2 (two) times daily for 10 days. 20 tablet 0   baclofen (LIORESAL) 10 MG tablet Take 1 tablet (10 mg total) by mouth 3 (three) times daily. 90 each 1   buprenorphine (SUBUTEX) 8 MG SUBL SL tablet Place 8 mg under the tongue every 8 (eight) hours.     cetirizine (ZYRTEC) 10 MG tablet Take 1 tablet (10 mg total) by mouth daily. 90 tablet 3   cloNIDine (CATAPRES) 0.1 MG tablet Take 1 tablet (0.1 mg total) by mouth 3 (three) times daily. 90 tablet 11   CONCERTA 18 MG CR tablet Take 18 mg by mouth at bedtime.     CONCERTA 36 MG CR tablet Take 36 mg by mouth daily as needed (ADHD).      DULoxetine (CYMBALTA) 60 MG capsule Take 1 capsule (60 mg total) by mouth 2 (two) times daily. 180 capsule 2   FLUoxetine (PROZAC) 40 MG capsule Take 1 capsule (40 mg total) by mouth every morning. 90 capsule 0   fluticasone (FLONASE) 50 MCG/ACT nasal spray USE 2 SPRAYS IN EACH NOSTRIL DAILY 48 mL 1   furosemide (LASIX) 20 MG tablet Take 1 tablet (20 mg total) by mouth daily. 90 tablet 3   gabapentin (NEURONTIN) 400 MG capsule Take 2 capsules (800 mg total) by mouth 3 (three) times daily. 180 capsule 11   hydrOXYzine (VISTARIL) 50 MG capsule Take 50 mg by mouth 4 (four) times daily as needed for anxiety.      ibuprofen (ADVIL) 800 MG tablet Take 1 tablet (800 mg total) by mouth every 8 (eight) hours as needed. 30 tablet 5   latanoprost (XALATAN) 0.005 % ophthalmic solution Place 1 drop into both eyes at bedtime. 2.5 mL 0   metFORMIN (GLUCOPHAGE) 500 MG tablet Take 1 tablet  (500 mg total) by mouth 2 (two) times daily with a meal. 180 tablet 3   Multiple Vitamins-Minerals (WOMENS MULTIVITAMIN PO) Take 1 tablet by mouth daily.      omeprazole (PRILOSEC) 40 MG capsule Take 1 capsule (40 mg total) by mouth daily. 90 capsule 3   ondansetron (ZOFRAN) 4 MG tablet Take 1 tablet (4 mg total) by mouth every 6 (six) hours as needed for nausea or vomiting. 60 tablet 2   rosuvastatin (CRESTOR) 5 MG tablet Take 1 tablet (5 mg total) by mouth daily. 30 tablet 11   Spacer/Aero-Holding Chambers (AEROCHAMBER PLUS WITH MASK) inhaler Use as instructed 1 each 2   zolpidem (AMBIEN) 5 MG tablet Take 1 tablet (5 mg total) by  mouth at bedtime as needed for up to 5 days for sleep. 5 tablet 0   Buprenorphine HCl-Naloxone HCl 8-2 MG FILM Place 2 Film under the tongue daily.      No facility-administered medications prior to visit.    Allergies  Allergen Reactions   Clindamycin/Lincomycin Nausea Only   Other Other (See Comments)    REFUSED BLOOD TRANSFUSION AND BLOOD PRODUCTS   Hydrocodone Nausea And Vomiting and Other (See Comments)    stomach pain    Morphine Nausea And Vomiting    ROS Review of Systems    Objective:    Physical Exam Exam conducted with a chaperone present.  Constitutional:      Comments: Drowsy but arosable  States she has been like this for several years.    HENT:     Head: Normocephalic and atraumatic.  Cardiovascular:     Rate and Rhythm: Normal rate.     Pulses: Normal pulses.  Genitourinary:   Musculoskeletal:     Cervical back: Normal range of motion.     Right lower leg: No edema.     Left lower leg: No edema.     Comments: BLE discoloration  Neurological:     Mental Status: She is alert.     BP 129/85    Pulse 82    Temp (!) 97.5 F (36.4 C) (Temporal)    Ht 5\' 2"  (1.575 m)    Wt 205 lb (93 kg)    LMP 09/14/2019 (Approximate)    SpO2 98%    BMI 37.49 kg/m  Wt Readings from Last 3 Encounters:  08/05/20 205 lb (93 kg)   05/17/20 204 lb 6.4 oz (92.7 kg)  04/05/20 209 lb (94.8 kg)     There are no preventive care reminders to display for this patient.  There are no preventive care reminders to display for this patient.  Lab Results  Component Value Date   TSH 1.610 12/24/2019   Lab Results  Component Value Date   WBC 14.2 (H) 01/22/2020   HGB 14.6 01/22/2020   HCT 45.4 01/22/2020   MCV 94.0 01/22/2020   PLT 210 01/22/2020   Lab Results  Component Value Date   NA 141 08/05/2020   K 3.9 08/05/2020   CO2 28 01/22/2020   GLUCOSE 96 08/05/2020   BUN 13 08/05/2020   CREATININE 0.89 08/05/2020   BILITOT 0.2 08/05/2020   ALKPHOS 120 08/05/2020   AST 14 08/05/2020   ALT 47 (H) 01/22/2020   PROT 6.9 08/05/2020   ALBUMIN 4.4 08/05/2020   CALCIUM 9.2 08/05/2020   ANIONGAP 9 01/22/2020   Lab Results  Component Value Date   CHOL 275 (H) 05/17/2020   Lab Results  Component Value Date   HDL 49 05/17/2020   Lab Results  Component Value Date   LDLCALC 201 (H) 05/17/2020   Lab Results  Component Value Date   TRIG 134 05/17/2020   Lab Results  Component Value Date   CHOLHDL 5.6 (H) 05/17/2020   Lab Results  Component Value Date   HGBA1C 6.2 (A) 02/17/2020   HGBA1C 6.2 02/17/2020   HGBA1C 6.2 02/17/2020   HGBA1C 6.2 02/17/2020      Assessment & Plan:   Problem List Items Addressed This Visit   None   Visit Diagnoses    Screening for STD (sexually transmitted disease)    -  Primary   Relevant Orders   POCT urinalysis dipstick (Completed)   Vaginal irritation  Relevant Orders   IGP, CtNg, rfx Aptima HPV ASCU   POCT urine pregnancy   Edema, unspecified type     Encourage compliance with current treatment plan Work up for Heart failure was negative, hepatic and renal disease negative; venous insufficiency to be evaluated in near future esp with tobacco use. Will also consider discontinuing the Amlodipine.  Avoid sitting or staying in one position for long periods of  time. Encourage movement Avoid eating too much salt Avoid NSAIDs Recommend elevation above heart level several times and day  Proper fitting compression socks or stocking Skin protection: keep skin clean, moisturized and free of injury    Relevant Orders   Ambulatory referral to Cardiology   Essential hypertension       Relevant Medications   spironolactone (ALDACTONE) 25 MG tablet   Other Relevant Orders   Ambulatory referral to Cardiology   Comp. Metabolic Panel (12) (Completed)   Yeast infection       Tobacco use disorder, continuous       Drowsy     Encourage pt to have her medication adjusted; Suboxone, duloxentin or fluoxteine, she declined. Amdits this has been ongoing       Meds ordered this encounter  Medications   spironolactone (ALDACTONE) 25 MG tablet    Sig: Take 1 tablet (25 mg total) by mouth daily for 7 days.    Dispense:  7 tablet    Refill:  0    Order Specific Question:   Supervising Provider    Answer:   Tresa Garter [[id number]]    Follow-up: Return in about 6 months (around 02/03/2021).    [name], NP

## 2020-08-06 LAB — COMP. METABOLIC PANEL (12)
AST: 14 IU/L (ref 0–40)
Albumin/Globulin Ratio: 1.8 (ref 1.2–2.2)
Albumin: 4.4 g/dL (ref 3.8–4.8)
Alkaline Phosphatase: 120 IU/L (ref 44–121)
BUN/Creatinine Ratio: 15 (ref 9–23)
BUN: 13 mg/dL (ref 6–24)
Bilirubin Total: 0.2 mg/dL (ref 0.0–1.2)
Calcium: 9.2 mg/dL (ref 8.7–10.2)
Chloride: 104 mmol/L (ref 96–106)
Creatinine, Ser: 0.89 mg/dL (ref 0.57–1.00)
GFR calc Af Amer: 90 mL/min/{1.73_m2} (ref >59)
GFR calc non Af Amer: 78 mL/min/{1.73_m2} (ref >59)
Globulin, Total: 2.5 g/dL (ref 1.5–4.5)
Glucose: 96 mg/dL (ref 65–99)
Potassium: 3.9 mmol/L (ref 3.5–5.2)
Sodium: 141 mmol/L (ref 134–144)
Total Protein: 6.9 g/dL (ref 6.0–8.5)

## 2020-08-07 ENCOUNTER — Encounter: Payer: Self-pay | Admitting: Nurse Practitioner

## 2020-08-08 LAB — IGP, CTNG, RFX APTIMA HPV ASCU
Chlamydia, Nuc. Acid Amp: NEGATIVE
Gonococcus by Nucleic Acid Amp: NEGATIVE

## 2020-08-09 ENCOUNTER — Encounter: Payer: Medicaid Other | Attending: Physical Medicine & Rehabilitation | Admitting: Physical Medicine & Rehabilitation

## 2020-08-09 ENCOUNTER — Other Ambulatory Visit: Payer: Self-pay | Admitting: Nurse Practitioner

## 2020-08-09 MED ORDER — METRONIDAZOLE 500 MG PO TABS: 500.0000 mg | ORAL_TABLET | Freq: Three times a day (TID) | ORAL | 0 refills | Status: DC

## 2020-08-17 ENCOUNTER — Telehealth: Payer: Self-pay | Admitting: Family Medicine

## 2020-08-17 ENCOUNTER — Other Ambulatory Visit: Payer: Self-pay | Admitting: Family Medicine

## 2020-08-17 DIAGNOSIS — B379 Candidiasis, unspecified: Secondary | ICD-10-CM

## 2020-08-17 DIAGNOSIS — T3695XA Adverse effect of unspecified systemic antibiotic, initial encounter: Secondary | ICD-10-CM

## 2020-08-17 MED ORDER — FLUCONAZOLE 150 MG PO TABS: 150.0000 mg | ORAL_TABLET | Freq: Once | ORAL | 0 refills | Status: AC

## 2020-08-17 NOTE — Telephone Encounter (Signed)
Done

## 2020-08-25 ENCOUNTER — Ambulatory Visit: Payer: Medicaid Other | Admitting: Internal Medicine

## 2020-08-25 NOTE — Progress Notes (Deleted)
Cardiology Office Note:    Date:  08/25/2020   ID:  Leah Barber, DOB October 22, 1975, MRN [id number]  PCP:  [name], NP  [facility] Cardiologist:  No primary care provider on file.  [facility] Electrophysiologist:  None   CC: *** Consulted for the evaluation of lower extremity edema at the behest of [name], NP  History of Present Illness:    Leah Barber is a 45 y.o. female with a hx of polysubstance abuse (cannabis, cocaine), DM with HTN, HLD, who presents for evaluation.  Patient notes that she is feeling ***.  Has had no chest pain, chest pressure, chest tightness, chest stinging ***.  Discomfort occurs with ***, worsens with ***, and improves with ***.  Patient exertion notable for *** with *** and feels no symptoms.  No shortness of breath, DOE ***.  No PND or orthopnea***.  No bendopnea***, weight gain***, leg swelling ***, or abdominal swelling***.  No syncope or near syncope ***.  Notes *** no palpitations or funny heart beats.     Patient reports prior cardiac testing including *** echo, *** stress test, *** heart catheterizations, *** cardioversion, *** ablations.  No history of ***pre-eclampsia or prematurity.  No Fen-Phen or drug use***.  Ambulatory BP ***.   Past Medical History:  Diagnosis Date  . Allergy   . Anemia   . Anxiety   . Arthritis   . Asthma   . Chronic lower back pain   . Depression   . Diabetes mellitus without complication ([place])   . Dyspnea   . GERD (gastroesophageal reflux disease)   . Glaucoma   . Hyperlipemia   . Hypertension   . IIH (idiopathic intracranial hypertension)   . Pseudotumor cerebri 08/13/2019  . Substance abuse ([place])    Rx drugs for pain medication. Has not had in 5 years.    Past Surgical History:  Procedure Laterality Date  . CESAREAN SECTION     x 3  . CYSTOSCOPY Bilateral 11/25/2019   Procedure: CYSTOSCOPY;  Surgeon: Lavonia Drafts, MD;  Location: [facility];  Service:  Gynecology;  Laterality: Bilateral;  . DERMOID CYST  EXCISION    . fluid removed from brain  12/20/2018  . ROBOTIC ASSISTED TOTAL HYSTERECTOMY Bilateral 11/25/2019   Procedure: XI ROBOTIC ASSISTED TOTAL HYSTERECTOMY WITH SALPINGECTOMY;  Surgeon: Lavonia Drafts, MD;  Location: [facility];  Service: Gynecology;  Laterality: Bilateral;    Current Medications: No outpatient medications have been marked as taking for the 08/25/20 encounter (Appointment) with Werner Lean, MD.     Allergies:   Clindamycin/lincomycin, Other, Hydrocodone, and Morphine   Social History   Socioeconomic History  . Marital status: Legally Separated    Spouse name: Not on file  . Number of children: Not on file  . Years of education: Not on file  . Highest education level: Not on file  Occupational History  . Not on file  Tobacco Use  . Smoking status: Current Every Day Smoker    Packs/day: 1.00    Types: Cigarettes  . Smokeless tobacco: Never Used  Vaping Use  . Vaping Use: Never used  Substance and Sexual Activity  . Alcohol use: Yes    Comment: occasional  . Drug use: No  . Sexual activity: Not Currently  Other Topics Concern  . Not on file  Social History Narrative  . Not on file   Social Determinants of Health   Financial Resource Strain: Not on  file  Food Insecurity: Not on file  Transportation Needs: Not on file  Physical Activity: Not on file  Stress: Not on file  Social Connections: Not on file     Family History: The patient's family history includes Cancer in her father; Diabetes in her mother; Hypertension in her mother; Liver cancer in her father. There is no history of Colon cancer, Esophageal cancer, Rectal cancer, Stomach cancer, or Pancreatic cancer. History of coronary artery disease notable for ***. History of heart failure notable for ***. No history of cardiomyopathies including hypertrophic cardiomyopathy, left ventricular non-compaction,  or arrhythmogenic right ventricular cardiomyopathy.*** History of arrhythmia notable for ***. Denies family history of sudden cardiac death including drowning, car accidents, or unexplained deaths in the family.*** No history of bicuspid aortic valve or aortic aneurysm or dissection.  ROS:   Please see the history of present illness.    All other systems reviewed and are negative.  EKGs/Labs/Other Studies Reviewed:    The following studies were reviewed today:  EKG:   01/26/20: Sinus rhythm rate 78  NonCardiac CT : Date:01/22/20 Results: No significant CAC or Aortic Atherosclerosis   Recent Labs: 12/24/2019: TSH 1.610 01/22/2020: ALT 47; Hemoglobin 14.6; Platelets 210 05/17/2020: BNP 3.2 08/05/2020: BUN 13; Creatinine, Ser 0.89; Potassium 3.9; Sodium 141  Recent Lipid Panel    Component Value Date/Time   CHOL 275 (H) 05/17/2020 1232   TRIG 134 05/17/2020 1232   HDL 49 05/17/2020 1232   CHOLHDL 5.6 (H) 05/17/2020 1232   CHOLHDL 4.6 11/23/2016 1043   VLDL 18 11/23/2016 1043   LDLCALC 201 (H) 05/17/2020 1232   Risk Assessment/Calculations:     The 10-year ASCVD risk score Denman George DC Montez Hageman., et al., 2013) is: 18.9%   Values used to calculate the score:     Age: 67 years     Sex: Female     Is Non-Hispanic African American: Yes     Diabetic: Yes     Tobacco smoker: Yes     Systolic Blood Pressure: 129 mmHg     Is BP treated: Yes     HDL Cholesterol: 49 mg/dL     Total Cholesterol: 275 mg/dL   Physical Exam:    VS:  LMP 09/14/2019 (Approximate)     Wt Readings from Last 3 Encounters:  08/05/20 205 lb (93 kg)  05/17/20 204 lb 6.4 oz (92.7 kg)  04/05/20 209 lb (94.8 kg)     GEN: *** Well nourished, well developed in no acute distress HEENT: Normal NECK: No JVD; No carotid bruits LYMPHATICS: No lymphadenopathy CARDIAC: ***RRR, no murmurs, rubs, gallops RESPIRATORY:  Clear to auscultation without rales, wheezing or rhonchi  ABDOMEN: Soft, non-tender,  non-distended MUSCULOSKELETAL:  No edema; No deformity  SKIN: Warm and dry NEUROLOGIC:  Alert and oriented x 3 PSYCHIATRIC:  Normal affect   ASSESSMENT:    No diagnosis found. PLAN:    In order of problems listed above:  Chest Pain History of polysubstance abuse - The patient presents with cardiac/possibly cardiac/non-cardiac *** - EKG shows *** without evidence of accessory pathway, ventricular pacing, digoxin use, LBBB, or baseline ST changes. - ASCVD risk estimated at *** - Additional Blood Work:  Lipids ***  - ASA 81 mg QD, statin, and beta blocker therapies ***  - Sublingual nitroglycerin as need for chest pain. *** - Would recommend an echocardiogram to assess LVEF and exclude WMA.  - Would recommend CCTA to exclude obstructive CAD  - Would recommend exercise/pharmacological ***nuclear medicine stress test (  NPO at midnight/hold beta blocker in AM); discussed risks, benefits, and alternatives of the diagnostic procedure including chest pain, arrhythmia, and death.  Patient amenable for testing. - if positive, discussed risks and benefits of cardiac catheterization have been discussed with the patient.  These include bleeding, infection, kidney damage, stroke, heart attack, death.  The patient understands these risks and is willing to proceed if necessary  *** Essential Hypertension, Diabetes with hypertension, Obesity/DM/HTN - ambulatory blood pressure ***, will start/continue ambulatory BP monitoring; gave education on how to perform ambulatory blood pressure monitoring including the frequency and technique; goal ambulatory blood pressure < 135/85 on average - continue home medications with the exception of *** - will get labs in 7-10 days (BMET, Mg***) - OSA Risk ***, Epworth Sleepiness Scale great than *** - concern for secondary HTN, will order renal artery duplex, and Aldo/renin***  - Arm/Leg BP Differential < 20 mm Hg; not suggestive of coaracation; Arm BP differential < 15  mm Hg not suggestive of subclavian stenosis - discussed diet (DASH/low sodium), and exercise/weight loss interventions ***  Hyperlipidemia (familial/mixed) -LDL goal less than ***70/100 - Fasting triglycerides notable for -continue current statin - Zetia vs Repatha*** - Bempedoic acid 180 mg PO daily *** tendon rupture - Vascepa*** - low threshold to send to lipid clinic for additional patient support *** - would stop niacin as it can raise blood sugars and increase uric acid*** - would stop OTC fish oil has little cardiovascular protection, is not regulated by FDA as it is actually a "supplement" and can continue incorrect amounts of DHA/EPA or harmful fats*** -Recheck lipid profile LFTs - gave education on dietary changes    {Are you ordering a CV Procedure (e.g. stress test, cath, DCCV, TEE, etc)?   Press F2        :[id number]    Medication Adjustments/Labs and Tests Ordered: Current medicines are reviewed at length with the patient today.  Concerns regarding medicines are outlined above.  No orders of the defined types were placed in this encounter.  No orders of the defined types were placed in this encounter.   There are no Patient Instructions on file for this visit.   Signed, Werner Lean, MD  08/25/2020 11:18 AM    [place]

## 2020-09-03 ENCOUNTER — Ambulatory Visit: Payer: Medicare Other

## 2020-09-03 ENCOUNTER — Telehealth: Payer: Medicare Other

## 2020-09-07 ENCOUNTER — Ambulatory Visit: Payer: Medicare Other

## 2020-09-15 ENCOUNTER — Telehealth: Payer: Self-pay | Admitting: Nurse Practitioner

## 2020-09-15 ENCOUNTER — Other Ambulatory Visit: Payer: Self-pay | Admitting: Nurse Practitioner

## 2020-09-15 NOTE — Telephone Encounter (Signed)
She has one year supply until 04/2021. Do I really need to resend?

## 2020-09-15 NOTE — Telephone Encounter (Signed)
Thanks

## 2020-09-15 NOTE — Telephone Encounter (Signed)
Tried to call pt her voicemail is full, spoke with [facility] has refill on her metformin for 1 year, she just need call the pharmacy to request a refill.[facility] is going to [facility] ahead [facility] her metformin for her, pt will need to pick this up from the pharmacy.

## 2020-09-19 NOTE — Progress Notes (Deleted)
Cardiology Office Note:    Date:  09/19/2020   ID:  Leah Barber, DOB [birth year], MRN [id number]  PCP:  [name], NP  [facility] Cardiologist:  No primary care provider on file.  [facility] Electrophysiologist:  None   Referring MD: [name], NP    History of Present Illness:    Leah Barber is a 45 y.o. female with a hx of anxiety, depression, HTN, HLD, DMII, and history of substance abue who was referred by Dionisio David, NP for further evaluation of LE edema.  Past Medical History:  Diagnosis Date  . Allergy   . Anemia   . Anxiety   . Arthritis   . Asthma   . Chronic lower back pain   . Depression   . Diabetes mellitus without complication ([place])   . Dyspnea   . GERD (gastroesophageal reflux disease)   . Glaucoma   . Hyperlipemia   . Hypertension   . IIH (idiopathic intracranial hypertension)   . Pseudotumor cerebri 08/13/2019  . Substance abuse ([place])    Rx drugs for pain medication. Has not had in 5 years.    Past Surgical History:  Procedure Laterality Date  . CESAREAN SECTION     x 3  . CYSTOSCOPY Bilateral 11/25/2019   Procedure: CYSTOSCOPY;  Surgeon: Lavonia Drafts, MD;  Location: [facility];  Service: Gynecology;  Laterality: Bilateral;  . DERMOID CYST  EXCISION    . fluid removed from brain  12/20/2018  . ROBOTIC ASSISTED TOTAL HYSTERECTOMY Bilateral 11/25/2019   Procedure: XI ROBOTIC ASSISTED TOTAL HYSTERECTOMY WITH SALPINGECTOMY;  Surgeon: Lavonia Drafts, MD;  Location: [facility];  Service: Gynecology;  Laterality: Bilateral;    Current Medications: No outpatient medications have been marked as taking for the 09/22/20 encounter (Appointment) with Freada Bergeron, MD.     Allergies:   Clindamycin/lincomycin, Other, Hydrocodone, and Morphine   Social History   Socioeconomic History  . Marital status: Legally Separated    Spouse name: Not on file  . Number of children: Not  on file  . Years of education: Not on file  . Highest education level: Not on file  Occupational History  . Not on file  Tobacco Use  . Smoking status: Current Every Day Smoker    Packs/day: 1.00    Types: Cigarettes  . Smokeless tobacco: Never Used  Vaping Use  . Vaping Use: Never used  Substance and Sexual Activity  . Alcohol use: Yes    Comment: occasional  . Drug use: No  . Sexual activity: Not Currently  Other Topics Concern  . Not on file  Social History Narrative  . Not on file   Social Determinants of Health   Financial Resource Strain: Not on file  Food Insecurity: Not on file  Transportation Needs: Not on file  Physical Activity: Not on file  Stress: Not on file  Social Connections: Not on file     Family History: The patient's ***family history includes Cancer in her father; Diabetes in her mother; Hypertension in her mother; Liver cancer in her father. There is no history of Colon cancer, Esophageal cancer, Rectal cancer, Stomach cancer, or Pancreatic cancer.  ROS:   Please see the history of present illness.    *** All other systems reviewed and are negative.  EKGs/Labs/Other Studies Reviewed:    The following studies were reviewed today: LE doppler ultrasound 05/2019:   Summary:  Right: There is no evidence of  deep vein thrombosis in the lower  extremity. No cystic structure found in the popliteal fossa.  Left: No evidence of common femoral vein obstruction.   CTA chest 01/2020:  FINDINGS: Cardiovascular: There are no filling defects within the pulmonary arteries to suggest pulmonary embolus. Thoracic aorta is normal in caliber. Minor aortic atherosclerosis. No evidence of dissection. Heart is normal in size. No pericardial effusion.  Mediastinum/Nodes: No enlarged mediastinal or hilar lymph nodes. No visualized thyroid nodule. Decompressed esophagus.  Lungs/Pleura: Streaky and consolidative opacity in the dependent right lower lobe.  Additional streaky opacities in the dependent left lower lobe. Upper lungs are clear. Mild emphysema. No pulmonary edema. No pleural fluid. 5 mm pleural-based nodule in the right upper lobe, series 6, image 37, not definitively seen on prior.  Upper Abdomen: Assessed on concurrent abdominal CT, reported separately.  Musculoskeletal: There are no acute or suspicious osseous abnormalities.  Review of the MIP images confirms the above findings.  IMPRESSION: 1. No pulmonary embolus. 2. Streaky and consolidative opacity in the dependent right lower lobe, suspicious for pneumonia. Additional streaky opacities in the dependent left lower lobe may be atelectasis or pneumonia. 3. Mild emphysema. 4. Right upper lobe 5 mm pleural-based nodule, not definitively seen on prior chest CT. No follow-up needed if patient is low-risk. Non-contrast chest CT can be considered in 12 months if patient is high-risk. This recommendation follows the consensus statement: Guidelines for Management of Incidental Pulmonary Nodules Detected on CT Images: From the Fleischner Society 2017; Radiology 2017; 284:228-243.  EKG:  EKG is *** ordered today.  The ekg ordered today demonstrates ***  Recent Labs: 12/24/2019: TSH 1.610 01/22/2020: ALT 47; Hemoglobin 14.6; Platelets 210 05/17/2020: BNP 3.2 08/05/2020: BUN 13; Creatinine, Ser 0.89; Potassium 3.9; Sodium 141  Recent Lipid Panel    Component Value Date/Time   CHOL 275 (H) 05/17/2020 1232   TRIG 134 05/17/2020 1232   HDL 49 05/17/2020 1232   CHOLHDL 5.6 (H) 05/17/2020 1232   CHOLHDL 4.6 11/23/2016 1043   VLDL 18 11/23/2016 1043   LDLCALC 201 (H) 05/17/2020 1232     Risk Assessment/Calculations:   {Does this patient have ATRIAL FIBRILLATION?:[id number]}   Physical Exam:    VS:  LMP 09/14/2019 (Approximate)     Wt Readings from Last 3 Encounters:  08/05/20 205 lb (93 kg)  05/17/20 204 lb 6.4 oz (92.7 kg)  04/05/20 209 lb (94.8 kg)     GEN:  *** Well nourished, well developed in no acute distress HEENT: Normal NECK: No JVD; No carotid bruits LYMPHATICS: No lymphadenopathy CARDIAC: ***RRR, no murmurs, rubs, gallops RESPIRATORY:  Clear to auscultation without rales, wheezing or rhonchi  ABDOMEN: Soft, non-tender, non-distended MUSCULOSKELETAL:  No edema; No deformity  SKIN: Warm and dry NEUROLOGIC:  Alert and oriented x 3 PSYCHIATRIC:  Normal affect   ASSESSMENT:    No diagnosis found. PLAN:    In order of problems listed above:  #LE edema:  #HTN:  #HLD:  #DMII   {Are you ordering a CV Procedure (e.g. stress test, cath, DCCV, TEE, etc)?   Press F2        :[id number]}    Medication Adjustments/Labs and Tests Ordered: Current medicines are reviewed at length with the patient today.  Concerns regarding medicines are outlined above.  No orders of the defined types were placed in this encounter.  No orders of the defined types were placed in this encounter.   There are no Patient Instructions on file for this visit.  Signed, Freada Bergeron, MD  09/19/2020 8:10 PM     [facility]

## 2020-09-22 ENCOUNTER — Ambulatory Visit: Payer: Medicare Other | Admitting: Cardiology

## 2020-09-28 ENCOUNTER — Ambulatory Visit: Payer: Medicare Other | Admitting: Internal Medicine

## 2020-09-28 NOTE — Progress Notes (Deleted)
Cardiology Office Note:    Date:  09/28/2020   ID:  Leah Barber, DOB [birth year], MRN [id number]  PCP:  [name], NP  [facility] Cardiologist:  No primary care provider on file.  [facility] Electrophysiologist:  None   CC: *** Consulted for the evaluation of LE edema at the behest of [name], NP  History of Present Illness:    Leah Barber is a 45 y.o. female with a hx of Polysubstance abuse (tobacco, cannabis, cocaine), OSA, and Morbid Obesity with DM with HTN, Familial HLD, aortic atherosclerosis who presents for evaluation 09/28/20.  Patient notes that she is feeling ***.  Has had no chest pain, chest pressure, chest tightness, chest stinging ***.  Discomfort occurs with ***, worsens with ***, and improves with ***.  Patient exertion notable for *** with *** and feels no symptoms.  No shortness of breath, DOE ***.  No PND or orthopnea***.  No bendopnea***, weight gain***, leg swelling ***, or abdominal swelling***.  No syncope or near syncope ***. Notes *** no palpitations or funny heart beats.     Patient reports prior cardiac testing including *** echo, *** stress test, *** heart catheterizations, *** cardioversion, *** ablations.  No history of ***pre-eclampsia, early menarche, or prematurity.  No Fen-Phen or drug use***.  Ambulatory BP ***.   Past Medical History:  Diagnosis Date  . Allergy   . Anemia   . Anxiety   . Arthritis   . Asthma   . Chronic lower back pain   . Depression   . Diabetes mellitus without complication ([place])   . Dyspnea   . GERD (gastroesophageal reflux disease)   . Glaucoma   . Hyperlipemia   . Hypertension   . IIH (idiopathic intracranial hypertension)   . Pseudotumor cerebri 08/13/2019  . Substance abuse ([place])    Rx drugs for pain medication. Has not had in 5 years.    Past Surgical History:  Procedure Laterality Date  . CESAREAN SECTION     x 3  . CYSTOSCOPY Bilateral 11/25/2019   Procedure: CYSTOSCOPY;  Surgeon:  Lavonia Drafts, MD;  Location: [facility];  Service: Gynecology;  Laterality: Bilateral;  . DERMOID CYST  EXCISION    . fluid removed from brain  12/20/2018  . ROBOTIC ASSISTED TOTAL HYSTERECTOMY Bilateral 11/25/2019   Procedure: XI ROBOTIC ASSISTED TOTAL HYSTERECTOMY WITH SALPINGECTOMY;  Surgeon: Lavonia Drafts, MD;  Location: [facility];  Service: Gynecology;  Laterality: Bilateral;    Current Medications: No outpatient medications have been marked as taking for the 09/28/20 encounter (Appointment) with Werner Lean, MD.     Allergies:   Clindamycin/lincomycin, Other, Hydrocodone, and Morphine   Social History   Socioeconomic History  . Marital status: Legally Separated    Spouse name: Not on file  . Number of children: Not on file  . Years of education: Not on file  . Highest education level: Not on file  Occupational History  . Not on file  Tobacco Use  . Smoking status: Current Every Day Smoker    Packs/day: 1.00    Types: Cigarettes  . Smokeless tobacco: Never Used  Vaping Use  . Vaping Use: Never used  Substance and Sexual Activity  . Alcohol use: Yes    Comment: occasional  . Drug use: No  . Sexual activity: Not Currently  Other Topics Concern  . Not on file  Social History Narrative  . Not on file   Social  Determinants of Health   Financial Resource Strain: Not on file  Food Insecurity: Not on file  Transportation Needs: Not on file  Physical Activity: Not on file  Stress: Not on file  Social Connections: Not on file     Family History: The patient's ***family history includes Cancer in her father; Diabetes in her mother; Hypertension in her mother; Liver cancer in her father. There is no history of Colon cancer, Esophageal cancer, Rectal cancer, Stomach cancer, or Pancreatic cancer.  ROS:   Please see the history of present illness.    *** All other systems reviewed and are  negative.  EKGs/Labs/Other Studies Reviewed:    The following studies were reviewed today:  EKG:   09/28/20: ***  NonCardiac CT : Date: 01/22/2020 Personally Reviewed Results: Aortic Atherosclerosis without coronary artery calcifications  Recent Labs: 12/24/2019: TSH 1.610 01/22/2020: ALT 47; Hemoglobin 14.6; Platelets 210 05/17/2020: BNP 3.2 08/05/2020: BUN 13; Creatinine, Ser 0.89; Potassium 3.9; Sodium 141  Recent Lipid Panel    Component Value Date/Time   CHOL 275 (H) 05/17/2020 1232   TRIG 134 05/17/2020 1232   HDL 49 05/17/2020 1232   CHOLHDL 5.6 (H) 05/17/2020 1232   CHOLHDL 4.6 11/23/2016 1043   VLDL 18 11/23/2016 1043   LDLCALC 201 (H) 05/17/2020 1232     Risk Assessment/Calculations:   {Does this patient have ATRIAL FIBRILLATION?:[phone]}   Physical Exam:    VS:  LMP 09/14/2019 (Approximate)     Wt Readings from Last 3 Encounters:  08/05/20 205 lb (93 kg)  05/17/20 204 lb 6.4 oz (92.7 kg)  04/05/20 209 lb (94.8 kg)     GEN: *** Well nourished, well developed in no acute distress HEENT: Normal NECK: No JVD; No carotid bruits LYMPHATICS: No lymphadenopathy CARDIAC: ***RRR, no murmurs, rubs, gallops RESPIRATORY:  Clear to auscultation without rales, wheezing or rhonchi  ABDOMEN: Soft, non-tender, non-distended MUSCULOSKELETAL:  No edema; No deformity  SKIN: Warm and dry NEUROLOGIC:  Alert and oriented x 3 PSYCHIATRIC:  Normal affect   ASSESSMENT:    No diagnosis found. PLAN:    In order of problems listed above:  Lower extremity Edema (bilateral) with concern for venous insufficiency - without without leg claudication or ulcer *** - will get echocardiogram, BNP/NTproBNP - will get Arterial duplex bilateral and ABI - no evidence of VTE; will order Bilateral Venous Lower Extremity Reflux study - will increase utilization of compression stockings - CMP, UA, and thyroid studies for rule out hypoproteinemia, nephrotic syndrome, thyroid disease based  on prior testing - on no iatrogenic medications - if no improvement by follow up visit, with no other cause, will send to vein/vascular specialist for evaluation   Obesity/DM/HTN - ambulatory blood pressure ***, will start/continue ambulatory BP monitoring; gave education on how to perform ambulatory blood pressure monitoring including the frequency and technique; goal ambulatory blood pressure < 135/85 on average - continue home medications with the exception of *** - will get labs in 7-10 days (BMET, Mg***) - OSA Risk ***, Epworth Sleepiness Scale great than *** - concern for secondary HTN, will order renal artery duplex, and Aldo/renin***  - Arm/Leg BP Differential < 20 mm Hg; not suggestive of coaracation; Arm BP differential < 15 mm Hg not suggestive of subclavian stenosis - discussed diet (DASH/low sodium), and exercise/weight loss interventions ***  Hyperlipidemia (familial) Aortic Atherosclerosis -LDL goal less than ***70/100 - Fasting triglycerides notable for -continue current statin - Zetia vs Repatha*** - Bempedoic acid 180 mg PO daily ***  tendon rupture - Vascepa*** - low threshold to send to lipid clinic for additional patient support *** - would stop niacin as it can raise blood sugars and increase uric acid*** - would stop OTC fish oil has little cardiovascular protection, is not regulated by FDA as it is actually a "supplement" and can continue incorrect amounts of DHA/EPA or harmful fats*** -Recheck lipid profile LFTs - gave education on dietary changes    {Are you ordering a CV Procedure (e.g. stress test, cath, DCCV, TEE, etc)?   Press F2        :[id number]}    Medication Adjustments/Labs and Tests Ordered: Current medicines are reviewed at length with the patient today.  Concerns regarding medicines are outlined above.  No orders of the defined types were placed in this encounter.  No orders of the defined types were placed in this encounter.   There are no  Patient Instructions on file for this visit.   Signed, Werner Lean, MD  09/28/2020 1:14 PM    [place]

## 2020-10-04 ENCOUNTER — Encounter: Payer: Self-pay | Admitting: General Practice

## 2020-10-19 ENCOUNTER — Other Ambulatory Visit: Payer: Self-pay | Admitting: Nurse Practitioner

## 2020-10-19 NOTE — Telephone Encounter (Signed)
Pt called checking status of ibuprofen. Pt desire to pick up today even though pt has appt tomorrow.

## 2020-10-20 ENCOUNTER — Ambulatory Visit: Payer: Medicare Other | Admitting: Nurse Practitioner

## 2020-10-20 ENCOUNTER — Other Ambulatory Visit: Payer: Self-pay

## 2020-10-22 ENCOUNTER — Ambulatory Visit: Payer: Medicare Other | Admitting: Nurse Practitioner

## 2020-10-22 ENCOUNTER — Telehealth: Payer: Self-pay

## 2020-10-22 NOTE — Telephone Encounter (Signed)
Pt stated couldn't make to appt today , However she's going to the [facility]RG[facility] care and is asking if she can be referral to a cardiologist.

## 2020-11-03 ENCOUNTER — Other Ambulatory Visit: Payer: Self-pay | Admitting: Family Medicine

## 2020-11-03 DIAGNOSIS — Z9109 Other allergy status, other than to drugs and biological substances: Secondary | ICD-10-CM

## 2020-11-10 ENCOUNTER — Telehealth: Payer: Self-pay | Admitting: Nurse Practitioner

## 2020-11-10 NOTE — Telephone Encounter (Signed)
error 

## 2020-11-11 ENCOUNTER — Ambulatory Visit (INDEPENDENT_AMBULATORY_CARE_PROVIDER_SITE_OTHER): Payer: Medicare Other | Admitting: Nurse Practitioner

## 2020-11-11 ENCOUNTER — Emergency Department (HOSPITAL_COMMUNITY): Payer: Medicare Other

## 2020-11-11 ENCOUNTER — Encounter: Payer: Self-pay | Admitting: Nurse Practitioner

## 2020-11-11 ENCOUNTER — Emergency Department (HOSPITAL_COMMUNITY)
Admission: EM | Admit: 2020-11-11 | Discharge: 2020-11-11 | Disposition: A | Discharge status: Home / Self Care | Payer: Medicare Other | Attending: Emergency Medicine | Admitting: Emergency Medicine

## 2020-11-11 ENCOUNTER — Other Ambulatory Visit: Payer: Self-pay

## 2020-11-11 ENCOUNTER — Encounter (HOSPITAL_COMMUNITY): Payer: Self-pay

## 2020-11-11 VITALS — BP 132/85 | HR 75 | Temp 97.3°F | Ht 62.0 in | Wt 214.0 lb

## 2020-11-11 DIAGNOSIS — R600 Localized edema: Secondary | ICD-10-CM | POA: Diagnosis not present

## 2020-11-11 DIAGNOSIS — Z79899 Other long term (current) drug therapy: Secondary | ICD-10-CM | POA: Insufficient documentation

## 2020-11-11 DIAGNOSIS — G4710 Hypersomnia, unspecified: Secondary | ICD-10-CM

## 2020-11-11 DIAGNOSIS — R079 Chest pain, unspecified: Secondary | ICD-10-CM | POA: Diagnosis present

## 2020-11-11 DIAGNOSIS — E119 Type 2 diabetes mellitus without complications: Secondary | ICD-10-CM | POA: Diagnosis not present

## 2020-11-11 DIAGNOSIS — R200 Anesthesia of skin: Secondary | ICD-10-CM | POA: Diagnosis not present

## 2020-11-11 DIAGNOSIS — Z7984 Long term (current) use of oral hypoglycemic drugs: Secondary | ICD-10-CM | POA: Diagnosis not present

## 2020-11-11 DIAGNOSIS — R0602 Shortness of breath: Secondary | ICD-10-CM | POA: Insufficient documentation

## 2020-11-11 DIAGNOSIS — R5383 Other fatigue: Secondary | ICD-10-CM | POA: Diagnosis not present

## 2020-11-11 DIAGNOSIS — F17210 Nicotine dependence, cigarettes, uncomplicated: Secondary | ICD-10-CM | POA: Insufficient documentation

## 2020-11-11 DIAGNOSIS — I10 Essential (primary) hypertension: Secondary | ICD-10-CM | POA: Insufficient documentation

## 2020-11-11 DIAGNOSIS — J45909 Unspecified asthma, uncomplicated: Secondary | ICD-10-CM | POA: Diagnosis not present

## 2020-11-11 DIAGNOSIS — L0390 Cellulitis, unspecified: Secondary | ICD-10-CM

## 2020-11-11 DIAGNOSIS — R609 Edema, unspecified: Secondary | ICD-10-CM

## 2020-11-11 LAB — CBC
HCT: 40.2 % (ref 36.0–46.0)
Hemoglobin: 12.8 g/dL (ref 12.0–15.0)
MCH: 30.5 pg (ref 26.0–34.0)
MCHC: 31.8 g/dL (ref 30.0–36.0)
MCV: 95.7 fL (ref 80.0–100.0)
Platelets: 202 10*3/uL (ref 150–400)
RBC: 4.2 MIL/uL (ref 3.87–5.11)
RDW: 11.9 % (ref 11.5–15.5)
WBC: 8 10*3/uL (ref 4.0–10.5)
nRBC: 0 % (ref 0.0–0.2)

## 2020-11-11 LAB — RAPID URINE DRUG SCREEN, HOSP PERFORMED
Amphetamines: NOT DETECTED
Barbiturates: NOT DETECTED
Benzodiazepines: NOT DETECTED
Cocaine: NOT DETECTED
Opiates: NOT DETECTED
Tetrahydrocannabinol: POSITIVE — AB

## 2020-11-11 LAB — POCT URINALYSIS DIPSTICK
Bilirubin, UA: NEGATIVE
Blood, UA: NEGATIVE
Clarity, UA: NEGATIVE
Glucose, UA: NEGATIVE
Ketones, UA: NEGATIVE
Leukocytes, UA: NEGATIVE
Nitrite, UA: NEGATIVE
Protein, UA: NEGATIVE
Spec Grav, UA: 1.015 (ref 1.010–1.025)
Urobilinogen, UA: 1 E.U./dL
pH, UA: 6 (ref 5.0–8.0)

## 2020-11-11 LAB — BLOOD GAS, VENOUS
Acid-Base Excess: 3 mmol/L — ABNORMAL HIGH (ref 0.0–2.0)
Bicarbonate: 30.6 mmol/L — ABNORMAL HIGH (ref 20.0–28.0)
O2 Saturation: 40.9 %
Patient temperature: 98.6
pCO2, Ven: 64.4 mmHg — ABNORMAL HIGH (ref 44.0–60.0)
pH, Ven: 7.298 (ref 7.250–7.430)
pO2, Ven: <31 mmHg — CL (ref 32.0–45.0)

## 2020-11-11 LAB — COMPREHENSIVE METABOLIC PANEL
ALT: 20 U/L (ref 0–44)
AST: 21 U/L (ref 15–41)
Albumin: 4 g/dL (ref 3.5–5.0)
Alkaline Phosphatase: 98 U/L (ref 38–126)
Anion gap: 6 (ref 5–15)
BUN: 16 mg/dL (ref 6–20)
CO2: 29 mmol/L (ref 22–32)
Calcium: 9 mg/dL (ref 8.9–10.3)
Chloride: 107 mmol/L (ref 98–111)
Creatinine, Ser: 0.81 mg/dL (ref 0.44–1.00)
GFR, Estimated: >60 mL/min (ref >60)
Glucose, Bld: 99 mg/dL (ref 70–99)
Potassium: 3.5 mmol/L (ref 3.5–5.1)
Sodium: 142 mmol/L (ref 135–145)
Total Bilirubin: 0.4 mg/dL (ref 0.3–1.2)
Total Protein: 7.2 g/dL (ref 6.5–8.1)

## 2020-11-11 LAB — POCT GLYCOSYLATED HEMOGLOBIN (HGB A1C)
HbA1c POC (<> result, manual entry): 8 % (ref 4.0–5.6)
HbA1c, POC (controlled diabetic range): 8 % — AB (ref 0.0–7.0)
HbA1c, POC (prediabetic range): 8 % — AB (ref 5.7–6.4)
Hemoglobin A1C: 8 % — AB (ref 4.0–5.6)

## 2020-11-11 LAB — BRAIN NATRIURETIC PEPTIDE: B Natriuretic Peptide: 19.4 pg/mL (ref 0.0–100.0)

## 2020-11-11 LAB — TROPONIN I (HIGH SENSITIVITY): Troponin I (High Sensitivity): 3 ng/L (ref <18)

## 2020-11-11 MED ORDER — NALOXONE HCL 0.4 MG/ML IJ SOLN
0.4000 mg | Freq: Once | INTRAMUSCULAR | Status: AC
  Administered 2020-11-11: 0.4 mg via INTRAVENOUS
  Filled 2020-11-11: qty 1

## 2020-11-11 MED ORDER — FUROSEMIDE 40 MG PO TABS: 40.0000 mg | ORAL_TABLET | Freq: Every day | ORAL | 0 refills | Status: DC

## 2020-11-11 MED ORDER — FUROSEMIDE 10 MG/ML IJ SOLN
40.0000 mg | Freq: Once | INTRAMUSCULAR | Status: AC
  Administered 2020-11-11: 40 mg via INTRAVENOUS
  Filled 2020-11-11: qty 4

## 2020-11-11 NOTE — ED Notes (Signed)
Patient placed on 3L O2 via [place]

## 2020-11-11 NOTE — ED Notes (Addendum)
Pt did not want to ambulate but was able to convince pt to walk around the room and her O2 dropped to 92% without O2.  Pt said she did not feel right after O2 was removed and pt was still lethargic.  Pt also complained about being weak because pt has not eaten. Pt also said whenever she gets up she feels dizzy.

## 2020-11-11 NOTE — ED Notes (Signed)
Leah Barber [phone] Daughter Can give patient ride home prn

## 2020-11-11 NOTE — ED Triage Notes (Addendum)
Patient c/o SOB x 2-3 months and bilateral lower extremity edema x 1 month. Patient also c/o chest pain x 1 month.  Patient states she has a splinter on the left side of left foot and is unable to remove it.

## 2020-11-11 NOTE — ED Notes (Addendum)
This RN attempted IV x2, Levada Dy RN attempted once, IV consult placed

## 2020-11-11 NOTE — ED Notes (Signed)
Patient super lethargic, arouses to touch and voice, then goes back to sleep soon after, will continue to monitor

## 2020-11-11 NOTE — ED Notes (Signed)
Patient ambulated from room 22 to bathroom, stable gait

## 2020-11-11 NOTE — Progress Notes (Signed)
[address], [place]  [address] Phone:  [phone]   Fax:  [phone]   Established Patient Office Visit  Subjective:  Patient ID: Leah Barber, female    DOB: [birth year]  Age: 45 y.o. MRN: [id number]  CC:  Chief Complaint  Patient presents with  . Follow-up    1 month swelling legs  and hands , elv. Legs , redness, pt having pain with walking     HPI Leah Barber presents for follow up. She  has a past medical history of Allergy, Anemia, Anxiety, Arthritis, Asthma, Chronic lower back pain, Depression, Diabetes mellitus without complication ([place]), Dyspnea, GERD (gastroesophageal reflux disease), Glaucoma, Hyperlipemia, Hypertension, IIH (idiopathic intracranial hypertension), Pseudotumor cerebri (08/13/2019), and Substance abuse ([place]).   Edema Patient complains of edema in both lower legs. The edema has been severe. Onset of symptoms was several weeks ago, and patient reports symptoms have gradually worsened since that time. The edema is present all day. The patient states the problem is long-standing. The swelling has been aggravated by dependency of involved area and use of calcium channel blockers. The swelling has been relieved by diuretics, support stockings, elevation of involved area, low-salt diet. Associated factors include: use of calcium channel blockers. Cardiac risk factors include hypertension, obesity (BMI >= 30 kg/m2) and smoking/ tobacco exposure.  She is not voiding well times daily. She admits that elevating her legs only cause the fluid to go above the knee with no relief She also complains of a splinter her left foot.   Past Medical History:  Diagnosis Date  . Allergy   . Anemia   . Anxiety   . Arthritis   . Asthma   . Chronic lower back pain   . Depression   . Diabetes mellitus without complication ([place])   . Dyspnea   . GERD (gastroesophageal reflux disease)   . Glaucoma   . Hyperlipemia   . Hypertension    . IIH (idiopathic intracranial hypertension)   . Pseudotumor cerebri 08/13/2019  . Substance abuse ([place])    Rx drugs for pain medication. Has not had in 5 years.    Past Surgical History:  Procedure Laterality Date  . CESAREAN SECTION     x 3  . CYSTOSCOPY Bilateral 11/25/2019   Procedure: CYSTOSCOPY;  Surgeon: Lavonia Drafts, MD;  Location: [facility];  Service: Gynecology;  Laterality: Bilateral;  . DERMOID CYST  EXCISION    . fluid removed from brain  12/20/2018  . ROBOTIC ASSISTED TOTAL HYSTERECTOMY Bilateral 11/25/2019   Procedure: XI ROBOTIC ASSISTED TOTAL HYSTERECTOMY WITH SALPINGECTOMY;  Surgeon: Lavonia Drafts, MD;  Location: [facility];  Service: Gynecology;  Laterality: Bilateral;    Family History  Problem Relation Age of Onset  . Diabetes Mother   . Hypertension Mother   . Cancer Father   . Liver cancer Father   . Colon cancer Neg Hx   . Esophageal cancer Neg Hx   . Rectal cancer Neg Hx   . Stomach cancer Neg Hx   . Pancreatic cancer Neg Hx     Social History   Socioeconomic History  . Marital status: Legally Separated    Spouse name: Not on file  . Number of children: Not on file  . Years of education: Not on file  . Highest education level: Not on file  Occupational History  . Not on file  Tobacco Use  . Smoking status: Current Every Day  Smoker    Packs/day: 1.00    Types: Cigarettes  . Smokeless tobacco: Never Used  Vaping Use  . Vaping Use: Never used  Substance and Sexual Activity  . Alcohol use: Yes    Comment: occasional  . Drug use: No  . Sexual activity: Not Currently  Other Topics Concern  . Not on file  Social History Narrative  . Not on file   Social Determinants of Health   Financial Resource Strain: Not on file  Food Insecurity: Not on file  Transportation Needs: Not on file  Physical Activity: Not on file  Stress: Not on file  Social Connections: Not on file  Intimate Partner  Violence: Not on file    Outpatient Medications Prior to Visit  Medication Sig Dispense Refill  . Albuterol Sulfate (PROAIR RESPICLICK) 559 (90 Base) MCG/ACT AEPB Inhale 2 puffs into the lungs 4 (four) times daily as needed. 1 each 5  . amLODipine (NORVASC) 10 MG tablet Take 1 tablet (10 mg total) by mouth daily. 90 tablet 3  . buprenorphine (SUBUTEX) 8 MG SUBL SL tablet Place 8 mg under the tongue every 8 (eight) hours.    . cetirizine (ZYRTEC) 10 MG tablet Take 1 tablet (10 mg total) by mouth daily. 90 tablet 3  . cloNIDine (CATAPRES) 0.1 MG tablet Take 1 tablet (0.1 mg total) by mouth 3 (three) times daily. 90 tablet 11  . DULoxetine (CYMBALTA) 60 MG capsule Take 1 capsule (60 mg total) by mouth 2 (two) times daily. 180 capsule 2  . fluticasone (FLONASE) 50 MCG/ACT nasal spray USE 2 SPRAYS IN EACH NOSTRIL DAILY 48 mL 1  . furosemide (LASIX) 20 MG tablet Take 1 tablet (20 mg total) by mouth daily. 90 tablet 3  . gabapentin (NEURONTIN) 400 MG capsule Take 2 capsules (800 mg total) by mouth 3 (three) times daily. 180 capsule 11  . hydrOXYzine (VISTARIL) 50 MG capsule Take 50 mg by mouth 4 (four) times daily as needed for anxiety.     Marland Kitchen ibuprofen (ADVIL) 800 MG tablet TAKE 1 TABLET BY MOUTH EVERY 8 HOURS AS NEEDED 30 tablet 5  . latanoprost (XALATAN) 0.005 % ophthalmic solution Place 1 drop into both eyes at bedtime. 2.5 mL 0  . metFORMIN (GLUCOPHAGE) 500 MG tablet Take 1 tablet (500 mg total) by mouth 2 (two) times daily with a meal. 180 tablet 3  . Multiple Vitamins-Minerals (WOMENS MULTIVITAMIN PO) Take 1 tablet by mouth daily.     Marland Kitchen omeprazole (PRILOSEC) 40 MG capsule Take 1 capsule (40 mg total) by mouth daily. 90 capsule 3  . ondansetron (ZOFRAN) 4 MG tablet Take 1 tablet (4 mg total) by mouth every 6 (six) hours as needed for nausea or vomiting. 60 tablet 2  . rosuvastatin (CRESTOR) 5 MG tablet Take 1 tablet (5 mg total) by mouth daily. 30 tablet 11  . baclofen (LIORESAL) 10 MG tablet  Take 1 tablet (10 mg total) by mouth 3 (three) times daily. 90 each 1  . Spacer/Aero-Holding Chambers (AEROCHAMBER PLUS WITH MASK) inhaler Use as instructed 1 each 2  . CONCERTA 18 MG CR tablet Take 18 mg by mouth at bedtime.    . CONCERTA 36 MG CR tablet Take 36 mg by mouth daily as needed (ADHD).     Marland Kitchen FLUoxetine (PROZAC) 40 MG capsule Take 1 capsule (40 mg total) by mouth every morning. 90 capsule 0  . spironolactone (ALDACTONE) 25 MG tablet Take 1 tablet (25 mg total) by mouth daily  for 7 days. 7 tablet 0  . zolpidem (AMBIEN) 5 MG tablet Take 1 tablet (5 mg total) by mouth at bedtime as needed for up to 5 days for sleep. 5 tablet 0  . FLUoxetine (PROZAC) 20 MG capsule Take 60 mg by mouth every morning.    . metroNIDAZOLE (FLAGYL) 500 MG tablet Take 1 tablet (500 mg total) by mouth 3 (three) times daily. 21 tablet 0   No facility-administered medications prior to visit.    Allergies  Allergen Reactions  . Clindamycin/Lincomycin Nausea Only  . Other Other (See Comments)    REFUSED BLOOD TRANSFUSION AND BLOOD PRODUCTS  . Hydrocodone Nausea And Vomiting and Other (See Comments)    stomach pain   . Morphine Nausea And Vomiting    ROS Review of Systems    Objective:    Physical Exam Constitutional:      Appearance: She is obese. She is toxic-appearing. She is not ill-appearing or diaphoretic.  Cardiovascular:     Pulses: Normal pulses.  Pulmonary:     Effort: Pulmonary effort is normal.  Musculoskeletal:     Right lower leg: Edema present.  Skin:    Findings: Erythema and lesion present.  Neurological:     General: No focal deficit present.     Mental Status: She is alert and oriented to person, place, and time.  Psychiatric:        Mood and Affect: Mood normal.        Behavior: Behavior normal.        Thought Content: Thought content normal.        Judgment: Judgment normal.     BP 132/85 (BP Location: Left Arm, Patient Position: Sitting, Cuff Size: Normal)   Pulse  75   Temp (!) 97.3 F (36.3 C) (Temporal)   Ht 5\' 2"  (1.575 m)   Wt 214 lb (97.1 kg)   LMP 09/14/2019 (Approximate)   BMI 39.14 kg/m  Wt Readings from Last 3 Encounters:  11/11/20 214 lb (97.1 kg)  08/05/20 205 lb (93 kg)  05/17/20 204 lb 6.4 oz (92.7 kg)     Health Maintenance Due  Topic Date Due  . COVID-19 Vaccine (1) Never done    There are no preventive care reminders to display for this patient.  Lab Results  Component Value Date   TSH 1.610 12/24/2019   Lab Results  Component Value Date   WBC 14.2 (H) 01/22/2020   HGB 14.6 01/22/2020   HCT 45.4 01/22/2020   MCV 94.0 01/22/2020   PLT 210 01/22/2020   Lab Results  Component Value Date   NA 141 08/05/2020   K 3.9 08/05/2020   CO2 28 01/22/2020   GLUCOSE 96 08/05/2020   BUN 13 08/05/2020   CREATININE 0.89 08/05/2020   BILITOT 0.2 08/05/2020   ALKPHOS 120 08/05/2020   AST 14 08/05/2020   ALT 47 (H) 01/22/2020   PROT 6.9 08/05/2020   ALBUMIN 4.4 08/05/2020   CALCIUM 9.2 08/05/2020   ANIONGAP 9 01/22/2020   Lab Results  Component Value Date   CHOL 275 (H) 05/17/2020   Lab Results  Component Value Date   HDL 49 05/17/2020   Lab Results  Component Value Date   LDLCALC 201 (H) 05/17/2020   Lab Results  Component Value Date   TRIG 134 05/17/2020   Lab Results  Component Value Date   CHOLHDL 5.6 (H) 05/17/2020   Lab Results  Component Value Date   HGBA1C 8.0 (A) 11/11/2020  HGBA1C 8.0 11/11/2020   HGBA1C 8.0 (A) 11/11/2020   HGBA1C 8.0 (A) 11/11/2020      Assessment & Plan:   Problem List Items Addressed This Visit   None   Visit Diagnoses    Type 2 diabetes mellitus without complication, without long-term current use of insulin (HCC)    -  Primary Worsening currently on Metformin A1c 8.0 up from 6.2%  Sent to ER for evaluation will make adjustments in near future   Relevant Orders   POCT Urinalysis Dipstick (Completed)   HgB A1c (Completed)   Cellulitis, unspecified cellulitis  site     Right leg due to injury and edema   Localized edema     Worsening sent to ED because home lasix 20 mg not effective      No orders of the defined types were placed in this encounter.   Follow-up: Return in about 2 weeks (around 11/25/2020).    [name], NP

## 2020-11-11 NOTE — ED Notes (Signed)
Discharged no concerns at this time.  °

## 2020-11-11 NOTE — Patient Instructions (Signed)
Edema  Edema is when you have too much fluid in your body or under your skin. Edema may make your legs, feet, and ankles swell up. Swelling is also common in looser tissues, like around your eyes. This is a common condition. It gets more common as you get older. There are many possible causes of edema. Eating too much salt (sodium) and being on your feet or sitting for a long time can cause edema in your legs, feet, and ankles. Hot weather may make edema worse. Edema is usually painless. Your skin may look swollen or shiny. Follow these instructions at home:  Keep the swollen body part raised (elevated) above the level of your heart when you are sitting or lying down.  Do not sit still or stand for a long time.  Do not wear tight clothes. Do not wear garters on your upper legs.  Exercise your legs. This can help the swelling go down.  Wear elastic bandages or support stockings as told by your doctor.  Eat a low-salt (low-sodium) diet to reduce fluid as told by your doctor.  Depending on the cause of your swelling, you may need to limit how much fluid you drink (fluid restriction).  Take over-the-counter and prescription medicines only as told by your doctor. Contact a doctor if:  Treatment is not working.  You have heart, liver, or kidney disease and have symptoms of edema.  You have sudden and unexplained weight gain. Get help right away if:  You have shortness of breath or chest pain.  You cannot breathe when you lie down.  You have pain, redness, or warmth in the swollen areas.  You have heart, liver, or kidney disease and get edema all of a sudden.  You have a fever and your symptoms get worse all of a sudden. Summary  Edema is when you have too much fluid in your body or under your skin.  Edema may make your legs, feet, and ankles swell up. Swelling is also common in looser tissues, like around your eyes.  Raise (elevate) the swollen body part above the level of your  heart when you are sitting or lying down.  Follow your doctor's instructions about diet and how much fluid you can drink (fluid restriction). This information is not intended to replace advice given to you by your health care provider. Make sure you discuss any questions you have with your health care provider. Document Revised: 06/02/2020 Document Reviewed: 06/02/2020 Elsevier Patient Education  2021 [employer]  

## 2020-11-11 NOTE — Discharge Instructions (Signed)
You have signs of fluid retention.  Fortunately your kidney function is normal and your heart function is normal as well.  Please wear compressive hose to help with leg swelling.  Take fluid pill for the next week as prescribed and follow-up closely with your doctor for further care.  You also experiencing excessive sleepiness.  Discussed this with your primary care doctor for further care.  Return if you have any concerns.

## 2020-11-11 NOTE — ED Notes (Signed)
Per NP at patient care center, states patient having B/L LE edema-states she is on lasix but unable to diurese

## 2020-11-11 NOTE — ED Provider Notes (Signed)
[facility] DEPT Provider Note   CSN: [id number] Arrival date & time: 11/11/20  1539     History Chief Complaint  Patient presents with  . Shortness of Breath  . Leg Swelling  . Chest Pain  . Foreign Body    Leah Barber is a 45 y.o. female.  The history is provided by the patient and medical records. No language interpreter was used.  Shortness of Breath Associated symptoms: chest pain   Chest Pain Associated symptoms: shortness of breath   Foreign Body    45 year old female significant history of polysubstance abuse, diabetes, pseudotumor cerebri, asthma, presenting complaining of chest pain and shortness of breath.  Patient report for the past 6 months she has noted a steady decline in her health.  She reported having fluid retention to both of her legs that is becoming increasingly more tender and having numbness to her toes.  She also reported having intermittent chest discomfort which she described more as a pressure sensation in her chest sometimes worse when she lays down and couple with coughing.  She also report decrease in urine production for the past several months.  She endorsed excessive sleepiness and generalized fatigue.  She admits to heavy tobacco use and smoke approximately a pack of cigarettes a day.  She drinks on occasion, used marijuana on occasion.  She does not complain of any fever or chills.  No hemoptysis.  No prior history of PE or DVT.  She had a splinter in her foot for 2 days, involving left foot and it is tender to palpation.  She is unable to remove the splinter.  She also report having a sleep study and was told that she has some mild sleep apnea but no signs of narcolepsy.  She went to see her PCP today for her complaints, and was recommended to come to the ER for further evaluation.  Past Medical History:  Diagnosis Date  . Allergy   . Anemia   . Anxiety   . Arthritis   . Asthma   . Chronic lower back pain   .  Depression   . Diabetes mellitus without complication ([place])   . Dyspnea   . GERD (gastroesophageal reflux disease)   . Glaucoma   . Hyperlipemia   . Hypertension   . IIH (idiopathic intracranial hypertension)   . Pseudotumor cerebri 08/13/2019  . Substance abuse ([place])    Rx drugs for pain medication. Has not had in 5 years.    Patient Active Problem List   Diagnosis Date Noted  . Abnormal uterine bleeding (AUB) 11/25/2019  . Post-operative state 11/25/2019  . Excessive daytime sleepiness 09/18/2019  . Hypersomnia with sleep apnea 09/18/2019  . Chronic pain syndrome 09/09/2019  . Pseudotumor cerebri 08/13/2019  . Nonintractable headache 07/07/2019  . History of idiopathic intracranial hypertension 07/07/2019  . Intertrigo 07/07/2019  . Environmental allergies 07/07/2019  . Fibroid 07/07/2019  . Chronic bronchitis ([place]) 07/02/2019  . Polysubstance dependence including opioid type drug with complication, episodic abuse, with unspecified complication ([place]) 60/63/0160  . Depression 09/13/2015  . Cluster B personality disorder ([place]) 05/15/2014  . Dependence on nicotine from cigarettes 05/15/2014  . Substance induced mood disorder ([place]) 05/15/2014  . Benzodiazepine abuse ([place]) 09/20/2011  . Cannabis abuse 05/04/2011  . Cocaine abuse ([place]) 05/04/2011  . Polysubstance dependence ([place]) 05/04/2011  . Candidiasis of vagina 04/30/2011  . Chronic pain in right shoulder 04/30/2011  . Depressive disorder, not elsewhere classified 04/30/2011  . Otitis  media, acute 04/30/2011    Past Surgical History:  Procedure Laterality Date  . CESAREAN SECTION     x 3  . CYSTOSCOPY Bilateral 11/25/2019   Procedure: CYSTOSCOPY;  Surgeon: Lavonia Drafts, MD;  Location: [facility];  Service: Gynecology;  Laterality: Bilateral;  . DERMOID CYST  EXCISION    . fluid removed from brain  12/20/2018  . ROBOTIC ASSISTED TOTAL HYSTERECTOMY Bilateral 11/25/2019   Procedure: XI ROBOTIC  ASSISTED TOTAL HYSTERECTOMY WITH SALPINGECTOMY;  Surgeon: Lavonia Drafts, MD;  Location: [facility];  Service: Gynecology;  Laterality: Bilateral;     OB History    Gravida  4   Para  3   Term  3   Preterm      AB  1   Living  3     SAB  1   IAB      Ectopic      Multiple      Live Births  3           Family History  Problem Relation Age of Onset  . Diabetes Mother   . Hypertension Mother   . Cancer Father   . Liver cancer Father   . Colon cancer Neg Hx   . Esophageal cancer Neg Hx   . Rectal cancer Neg Hx   . Stomach cancer Neg Hx   . Pancreatic cancer Neg Hx     Social History   Tobacco Use  . Smoking status: Current Every Day Smoker    Packs/day: 1.00    Types: Cigarettes  . Smokeless tobacco: Never Used  Vaping Use  . Vaping Use: Never used  Substance Use Topics  . Alcohol use: Yes    Comment: occasional  . Drug use: No    Home Medications Prior to Admission medications   Medication Sig Start Date End Date Taking? Authorizing Provider  Albuterol Sulfate (PROAIR RESPICLICK) 604 (90 Base) MCG/ACT AEPB Inhale 2 puffs into the lungs 4 (four) times daily as needed. 05/17/20 05/17/21  [name], NP  amLODipine (NORVASC) 10 MG tablet Take 1 tablet (10 mg total) by mouth daily. 05/17/20 05/17/21  [name], NP  buprenorphine (SUBUTEX) 8 MG SUBL SL tablet Place 8 mg under the tongue every 8 (eight) hours. 03/31/20   [provider]  cetirizine (ZYRTEC) 10 MG tablet Take 1 tablet (10 mg total) by mouth daily. 05/17/20 05/17/21  [name], NP  cloNIDine (CATAPRES) 0.1 MG tablet Take 1 tablet (0.1 mg total) by mouth 3 (three) times daily. 05/17/20 05/17/21  [name], NP  CONCERTA 18 MG CR tablet Take 18 mg by mouth at bedtime. 05/17/20   [provider]  CONCERTA 36 MG CR tablet Take 36 mg by mouth daily as needed (ADHD).  10/13/19   [provider]  DULoxetine (CYMBALTA) 60 MG capsule Take  1 capsule (60 mg total) by mouth 2 (two) times daily. 05/17/20   [name], NP  FLUoxetine (PROZAC) 40 MG capsule Take 1 capsule (40 mg total) by mouth every morning. 05/17/20 08/15/20  [name], NP  fluticasone [facility]) 50 MCG/ACT nasal spray USE 2 SPRAYS IN [facility] NOSTRIL DAILY 11/03/20   [name], NP  furosemide (LASIX) 20 MG tablet Take 1 tablet (20 mg total) by mouth daily. 05/17/20   [name], NP  gabapentin (NEURONTIN) 400 MG capsule Take 2 capsules (800 mg total) by mouth 3 (three) times daily. 05/17/20 05/17/21  [name], NP  hydrOXYzine (VISTARIL) 50 MG capsule Take 50 mg by mouth 4 (four) times daily as needed for anxiety.  11/26/19   [provider]  ibuprofen (ADVIL) 800 MG tablet TAKE 1 TABLET BY MOUTH EVERY 8 HOURS AS NEEDED 10/19/20   Azzie Glatter, FNP  latanoprost (XALATAN) 0.005 % ophthalmic solution Place 1 drop into both eyes at bedtime. 05/17/20   [name], NP  metFORMIN (GLUCOPHAGE) 500 MG tablet Take 1 tablet (500 mg total) by mouth 2 (two) times daily with a meal. 05/17/20 05/17/21  [name], NP  Multiple Vitamins-Minerals (WOMENS MULTIVITAMIN PO) Take 1 tablet by mouth daily.     [provider]  omeprazole (PRILOSEC) 40 MG capsule Take 1 capsule (40 mg total) by mouth daily. 05/17/20 05/17/21  [name], NP  ondansetron (ZOFRAN) 4 MG tablet Take 1 tablet (4 mg total) by mouth every 6 (six) hours as needed for nausea or vomiting. 05/17/20   [name], NP  rosuvastatin (CRESTOR) 5 MG tablet Take 1 tablet (5 mg total) by mouth daily. 05/20/20 05/20/21  [name], NP  spironolactone (ALDACTONE) 25 MG tablet Take 1 tablet (25 mg total) by mouth daily for 7 days. 08/05/20 08/12/20  [name], NP  zolpidem (AMBIEN) 5 MG tablet Take 1 tablet (5 mg total) by mouth at bedtime as needed for up to 5 days for sleep. 03/23/20 03/28/20  Dorena Dew, FNP    Allergies    Clindamycin/lincomycin, Other,  Hydrocodone, and Morphine  Review of Systems   Review of Systems  Respiratory: Positive for shortness of breath.   Cardiovascular: Positive for chest pain.  All other systems reviewed and are negative.   Physical Exam Updated Vital Signs BP 112/72 (BP Location: Left Arm)   Pulse 100   Temp 98 F (36.7 C) (Oral)   Resp 20   Ht 5\' 2"  (1.575 m)   Wt 97.1 kg   LMP 09/14/2019 (Approximate)   SpO2 93%   BMI 39.14 kg/m   Physical Exam Vitals and nursing note reviewed.  Constitutional:      General: She is not in acute distress.    Appearance: She is well-developed.  HENT:     Head: Atraumatic.  Eyes:     Conjunctiva/sclera: Conjunctivae normal.  Neck:     Vascular: No JVD.  Cardiovascular:     Rate and Rhythm: Normal rate and regular rhythm.  Pulmonary:     Effort: Pulmonary effort is normal.     Breath sounds: Normal breath sounds. No decreased breath sounds, wheezing, rhonchi or rales.  Musculoskeletal:     Cervical back: Neck supple.     Right lower leg: Edema present.     Left lower leg: Edema present.     Comments: 2+ edema to bilateral lower extremities extending towards the knees with intact distal pedal pulse.  Skin:    Capillary Refill: Capillary refill takes less than 2 seconds.     Findings: No rash.  Neurological:     General: No focal deficit present.     Mental Status: She is alert.     Comments: Patient is excessively sleepy but arousable and answer question appropriately     ED Results / Procedures / Treatments   Labs (all labs ordered are listed, but only abnormal results are displayed) Labs Reviewed  BLOOD GAS, VENOUS - Abnormal; Notable for the following components:      Result Value  pCO2, Ven 64.4 (*)    pO2, Ven <31.0 (*)    Bicarbonate 30.6 (*)    Acid-Base Excess 3.0 (*)    All other components within normal limits  RAPID URINE DRUG SCREEN, HOSP PERFORMED - Abnormal; Notable for the following components:   Tetrahydrocannabinol  POSITIVE (*)    All other components within normal limits  CBC  BRAIN NATRIURETIC PEPTIDE  COMPREHENSIVE METABOLIC PANEL  TROPONIN I (HIGH SENSITIVITY)  TROPONIN I (HIGH SENSITIVITY)    EKG None   Date: 11/11/2020  Rate: 84  Rhythm: normal sinus rhythm  QRS Axis: normal  Intervals: normal  ST/T Wave abnormalities: normal  Conduction Disutrbances: none  Narrative Interpretation:   Old EKG Reviewed: No significant changes noted     Radiology DG Chest 2 View  Result Date: 11/11/2020 CLINICAL DATA:  Chest pain and shortness of breath EXAM: CHEST - 2 VIEW COMPARISON:  None. FINDINGS: The heart size and mediastinal contours are within normal limits. There is prominence of the central pulmonary vasculature. No large airspace consolidation or pleural effusion. The visualized skeletal structures are unremarkable. IMPRESSION: Mild pulmonary vascular congestion. Electronically Signed   By: Prudencio Pair M.D.   On: 11/11/2020 18:30    Procedures Procedures   Medications Ordered in ED Medications  furosemide (LASIX) injection 40 mg (40 mg Intravenous Given 11/11/20 1829)  naloxone [facility]) injection 0.4 mg (0.4 mg Intravenous Given 11/11/20 1839)    ED Course  I have reviewed the triage vital signs and the nursing notes.  Pertinent labs & imaging results that were available during my care of the patient were reviewed by me and considered in my medical decision making (see chart for details).    MDM Rules/Calculators/A&P                          BP (!) 124/95   Pulse 83   Temp 98 F (36.7 C) (Oral)   Resp 10   Ht 5\' 4"  (1.626 m)   Wt 96.2 kg   LMP 09/14/2019 (Approximate)   SpO2 94%   BMI 36.39 kg/m   Final Clinical Impression(s) / ED Diagnoses Final diagnoses:  Peripheral edema  Excessive sleepiness    Rx / DC Orders ED Discharge Orders         Ordered    furosemide (LASIX) 40 MG tablet  Daily        11/11/20 2128         4:40 PM Here with recurrent chest  pain for the past several months as well as bilateral lower extremity edema.  She also report decreased urinary production.  Symptom concerning for signs of heart failure.  Work-up initiated.  Patient appears to be excessively drowsy but arousable.  Suspect hypercapnia.  9:25 PM Chest x-ray demonstrated mild pulmonary vascular congestion.  However the remainder of her labs are reassuring.  No normal electrolyte panel, normal renal function, blood gas did show elevated PCO2 and decreased PO2 consistence with hypercapnia but normal pH.  Normal BNP, normal hemoglobin and normal WBC.  UDS positive for marijuana.  Opioid was not detected.  At 1 point during her ER visit, nurse informed me that she is very drowsy and hard to arouse.  We did give patient 0.4 mg of Narcan without any response.  Later on she was arousable, able to ambulate while maintaining O2 sat is 92% on room air.  EKG unremarkable.  I discussed care with Dr. Maryan Rued.  Plan  to discharge patient home with a short course of Lasix for managements of her peripheral edema.  She will need to follow-up closely with her primary care provider for further care.  Return precaution given.   [name], PA-C 11/11/20 2130    Blanchie Dessert, MD 11/15/20 [phone]

## 2020-11-11 NOTE — ED Notes (Signed)
RN told me to hold off on ambulating pt

## 2020-11-11 NOTE — ED Notes (Signed)
Ronalee Belts RN to bedside to attempt US guided IV at this time

## 2020-11-19 ENCOUNTER — Ambulatory Visit (INDEPENDENT_AMBULATORY_CARE_PROVIDER_SITE_OTHER): Payer: Medicare Other | Admitting: Nurse Practitioner

## 2020-11-19 ENCOUNTER — Other Ambulatory Visit: Payer: Self-pay

## 2020-11-19 ENCOUNTER — Encounter: Payer: Self-pay | Admitting: Nurse Practitioner

## 2020-11-19 VITALS — BP 147/81 | HR 84 | Temp 97.8°F | Ht 64.0 in | Wt 214.0 lb

## 2020-11-19 DIAGNOSIS — F1920 Other psychoactive substance dependence, uncomplicated: Secondary | ICD-10-CM

## 2020-11-19 DIAGNOSIS — L0390 Cellulitis, unspecified: Secondary | ICD-10-CM | POA: Diagnosis not present

## 2020-11-19 DIAGNOSIS — R609 Edema, unspecified: Secondary | ICD-10-CM

## 2020-11-19 DIAGNOSIS — I10 Essential (primary) hypertension: Secondary | ICD-10-CM

## 2020-11-19 DIAGNOSIS — E119 Type 2 diabetes mellitus without complications: Secondary | ICD-10-CM | POA: Diagnosis not present

## 2020-11-19 MED ORDER — DOXYCYCLINE HYCLATE 100 MG PO CAPS: 100.0000 mg | ORAL_CAPSULE | Freq: Two times a day (BID) | ORAL | 0 refills | Status: AC

## 2020-11-19 NOTE — Progress Notes (Signed)
[facility], [place]  [address] Phone:  [phone]   Fax:  [phone]   Established Patient Office Visit  Subjective:  Patient ID: Leah Barber, female    DOB: [birth year]  Age: 45 y.o. MRN: [id number]  CC:  Chief Complaint  Patient presents with  . Follow-up    Follow up swelling up in legs     HPI Leah Barber presents for follow up. She  has a past medical history of Allergy, Anemia, Anxiety, Arthritis, Asthma, Chronic lower back pain, Depression, Diabetes mellitus without complication ([place]), Dyspnea, GERD (gastroesophageal reflux disease), Glaucoma, Hyperlipemia, Hypertension, IIH (idiopathic intracranial hypertension), Pseudotumor cerebri (08/13/2019), and Substance abuse ([place]).   She is in today for follow-up for bilateral lower extremity edema.  She was sent to the emergency department at her last follow-up visit due to increased bilateral lower extremity edema with decreased output; on daily furosemide.  Her overall work-up in the emergency department was negative and labs were normal.  She was complaining of pain and very concerned about her overall health.  She was referred to cardiology however did not follow through with the appointment.  She continues to have the edema without much resolution.  She continues to have weeping from right leg related to "sore" area. Denies headache, dizziness, visual changes, shortness of breath, dyspnea on exertion, chest pain, nausea, vomiting.   She does continue to have drowsiness.  She is currently on Suboxone for history of substance abuse.  She tested positive for cannabinoids at the emergency room on last week.  She admits that psychiatry is aware that she is using both substances.  This does affect her mental status however she does not agree.  When she was in the office on last week she was alert and oriented with no signs of drowsiness or lethargy.  She refused to have the medical  assistant carry her to the emergency room.  Per the emergency department notes patient was lethargic and very difficult to arouse.  A rapid urine drug screen was completed which revealed cannabinoids.  This orientation was different from the orientation  in the office.  Past Medical History:  Diagnosis Date  . Allergy   . Anemia   . Anxiety   . Arthritis   . Asthma   . Chronic lower back pain   . Depression   . Diabetes mellitus without complication ([place])   . Dyspnea   . GERD (gastroesophageal reflux disease)   . Glaucoma   . Hyperlipemia   . Hypertension   . IIH (idiopathic intracranial hypertension)   . Pseudotumor cerebri 08/13/2019  . Substance abuse ([place])    Rx drugs for pain medication. Has not had in 5 years.    Past Surgical History:  Procedure Laterality Date  . CESAREAN SECTION     x 3  . CYSTOSCOPY Bilateral 11/25/2019   Procedure: CYSTOSCOPY;  Surgeon: Lavonia Drafts, MD;  Location: [facility];  Service: Gynecology;  Laterality: Bilateral;  . DERMOID CYST  EXCISION    . fluid removed from brain  12/20/2018  . ROBOTIC ASSISTED TOTAL HYSTERECTOMY Bilateral 11/25/2019   Procedure: XI ROBOTIC ASSISTED TOTAL HYSTERECTOMY WITH SALPINGECTOMY;  Surgeon: Lavonia Drafts, MD;  Location: [facility];  Service: Gynecology;  Laterality: Bilateral;    Family History  Problem Relation Age of Onset  . Diabetes Mother   . Hypertension Mother   . Cancer Father   . Liver cancer  Father   . Colon cancer Neg Hx   . Esophageal cancer Neg Hx   . Rectal cancer Neg Hx   . Stomach cancer Neg Hx   . Pancreatic cancer Neg Hx     Social History   Socioeconomic History  . Marital status: Legally Separated    Spouse name: Not on file  . Number of children: Not on file  . Years of education: Not on file  . Highest education level: Not on file  Occupational History  . Not on file  Tobacco Use  . Smoking status: Current Every Day Smoker     Packs/day: 1.00    Types: Cigarettes  . Smokeless tobacco: Never Used  Vaping Use  . Vaping Use: Never used  Substance and Sexual Activity  . Alcohol use: Yes    Comment: occasional  . Drug use: No  . Sexual activity: Not Currently  Other Topics Concern  . Not on file  Social History Narrative  . Not on file   Social Determinants of Health   Financial Resource Strain: Not on file  Food Insecurity: Not on file  Transportation Needs: Not on file  Physical Activity: Not on file  Stress: Not on file  Social Connections: Not on file  Intimate Partner Violence: Not on file    Outpatient Medications Prior to Visit  Medication Sig Dispense Refill  . Albuterol Sulfate (PROAIR RESPICLICK) 347 (90 Base) MCG/ACT AEPB Inhale 2 puffs into the lungs 4 (four) times daily as needed. 1 each 5  . amLODipine (NORVASC) 10 MG tablet Take 1 tablet (10 mg total) by mouth daily. 90 tablet 3  . buprenorphine (SUBUTEX) 8 MG SUBL SL tablet Place 8 mg under the tongue every 8 (eight) hours.    . cetirizine (ZYRTEC) 10 MG tablet Take 1 tablet (10 mg total) by mouth daily. 90 tablet 3  . cloNIDine (CATAPRES) 0.1 MG tablet Take 1 tablet (0.1 mg total) by mouth 3 (three) times daily. 90 tablet 11  . CONCERTA 18 MG CR tablet Take 18 mg by mouth at bedtime.    . CONCERTA 36 MG CR tablet Take 36 mg by mouth daily as needed (ADHD).     . DULoxetine (CYMBALTA) 60 MG capsule Take 1 capsule (60 mg total) by mouth 2 (two) times daily. 180 capsule 2  . fluticasone (FLONASE) 50 MCG/ACT nasal spray USE 2 SPRAYS IN EACH NOSTRIL DAILY 48 mL 1  . furosemide (LASIX) 40 MG tablet Take 1 tablet (40 mg total) by mouth daily. 7 tablet 0  . gabapentin (NEURONTIN) 400 MG capsule Take 2 capsules (800 mg total) by mouth 3 (three) times daily. 180 capsule 11  . hydrOXYzine (VISTARIL) 50 MG capsule Take 50 mg by mouth 4 (four) times daily as needed for anxiety.     Marland Kitchen ibuprofen (ADVIL) 800 MG tablet TAKE 1 TABLET BY MOUTH EVERY 8  HOURS AS NEEDED 30 tablet 5  . latanoprost (XALATAN) 0.005 % ophthalmic solution Place 1 drop into both eyes at bedtime. 2.5 mL 0  . metFORMIN (GLUCOPHAGE) 500 MG tablet Take 1 tablet (500 mg total) by mouth 2 (two) times daily with a meal. 180 tablet 3  . Multiple Vitamins-Minerals (WOMENS MULTIVITAMIN PO) Take 1 tablet by mouth daily.     Marland Kitchen omeprazole (PRILOSEC) 40 MG capsule Take 1 capsule (40 mg total) by mouth daily. 90 capsule 3  . ondansetron (ZOFRAN) 4 MG tablet Take 1 tablet (4 mg total) by mouth every 6 (six)  hours as needed for nausea or vomiting. 60 tablet 2  . rosuvastatin (CRESTOR) 5 MG tablet Take 1 tablet (5 mg total) by mouth daily. 30 tablet 11  . FLUoxetine (PROZAC) 40 MG capsule Take 1 capsule (40 mg total) by mouth every morning. 90 capsule 0  . spironolactone (ALDACTONE) 25 MG tablet Take 1 tablet (25 mg total) by mouth daily for 7 days. 7 tablet 0  . zolpidem (AMBIEN) 5 MG tablet Take 1 tablet (5 mg total) by mouth at bedtime as needed for up to 5 days for sleep. 5 tablet 0   No facility-administered medications prior to visit.    Allergies  Allergen Reactions  . Clindamycin/Lincomycin Nausea Only  . Other Other (See Comments)    REFUSED BLOOD TRANSFUSION AND BLOOD PRODUCTS  . Hydrocodone Nausea And Vomiting and Other (See Comments)    stomach pain   . Morphine Nausea And Vomiting    ROS Review of Systems    Objective:    Physical Exam Constitutional:      Appearance: She is obese.     Comments: Drowsy throughout exam unable to respond appropriately  HENT:     Head: Normocephalic and atraumatic.  Cardiovascular:     Rate and Rhythm: Normal rate.     Pulses: Normal pulses.     Heart sounds: Normal heart sounds.  Pulmonary:     Effort: Pulmonary effort is normal.     Breath sounds: Normal breath sounds.  Musculoskeletal:     Right lower leg: Edema present.     Left lower leg: Edema present.  Skin:    Findings: Erythema and lesion present.      Comments: Discoloration to bilateral lower extremities Right leg pressure ulcer medial aspect  Psychiatric:        Behavior: Behavior normal.     Comments: Patient is questions appropriately when aroused     BP (!) 147/81   Pulse 84   Temp 97.8 F (36.6 C) (Temporal)   Ht 5\' 4"  (1.626 m)   Wt 214 lb (97.1 kg)   LMP 09/14/2019 (Approximate)   BMI 36.73 kg/m  Wt Readings from Last 3 Encounters:  11/19/20 214 lb (97.1 kg)  11/11/20 212 lb (96.2 kg)  11/11/20 214 lb (97.1 kg)     Health Maintenance Due  Topic Date Due  . COVID-19 Vaccine (1) Never done    There are no preventive care reminders to display for this patient.  Lab Results  Component Value Date   TSH 1.610 12/24/2019   Lab Results  Component Value Date   WBC 8.0 11/11/2020   HGB 12.8 11/11/2020   HCT 40.2 11/11/2020   MCV 95.7 11/11/2020   PLT 202 11/11/2020   Lab Results  Component Value Date   NA 142 11/11/2020   K 3.5 11/11/2020   CO2 29 11/11/2020   GLUCOSE 99 11/11/2020   BUN 16 11/11/2020   CREATININE 0.81 11/11/2020   BILITOT 0.4 11/11/2020   ALKPHOS 98 11/11/2020   AST 21 11/11/2020   ALT 20 11/11/2020   PROT 7.2 11/11/2020   ALBUMIN 4.0 11/11/2020   CALCIUM 9.0 11/11/2020   ANIONGAP 6 11/11/2020   Lab Results  Component Value Date   CHOL 275 (H) 05/17/2020   Lab Results  Component Value Date   HDL 49 05/17/2020   Lab Results  Component Value Date   LDLCALC 201 (H) 05/17/2020   Lab Results  Component Value Date   TRIG 134 05/17/2020  Lab Results  Component Value Date   CHOLHDL 5.6 (H) 05/17/2020   Lab Results  Component Value Date   HGBA1C 8.0 (A) 11/11/2020   HGBA1C 8.0 11/11/2020   HGBA1C 8.0 (A) 11/11/2020   HGBA1C 8.0 (A) 11/11/2020      Assessment & Plan:   Problem List Items Addressed This Visit      Other   Polysubstance dependence ([place]) Worsening very concerned 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    Other Visit Diagnoses    Edema,  unspecified type    -  Primary Persistent encourage patient to call and follow-up with cardiology Continue with furosemide and encourage compression hose of the appropriate size   Cellulitis, unspecified cellulitis site   Persistent doxycycline 100 mg twice daily   Type 2 diabetes mellitus without complication, without long-term current use of insulin (H[facility])     Persistent A1c 8.0 Encourage compliance with current treatment regimen  Encourage regular CBG monitoring Encourage contacting office if excessive hyperglycemia and or hypoglycemia Lifestyle modification with healthy diet (fewer calories, more high fiber foods, whole grains and non-starchy vegetables, lower fat meat and fish, low-fat diary include healthy oils) regular exercise (physical activity) and weight loss   Essential hypertension     Stable continue with current regimen      Meds ordered this encounter  Medications  . doxycycline (VIBRAMYCIN) 100 MG capsule    Sig: Take 1 capsule (100 mg total) by mouth 2 (two) times daily for 10 days.    Dispense:  20 capsule    Refill:  0    Order Specific Question:   Supervising Provider    Answer:   Tresa Garter [[id number]]    Follow-up: Return in about 3 months (around 02/18/2021).    [name], NP

## 2020-11-19 NOTE — Patient Instructions (Addendum)
Leg measurements Calf 16.25 inch and ankle 11.5 inches

## 2020-12-06 ENCOUNTER — Telehealth: Payer: Medicaid Other | Admitting: Neurology

## 2020-12-06 NOTE — Progress Notes (Deleted)
Virtual Visit via Video Note  I connected with Chalmers Cater on 12/06/20 at  4:00 PM EDT by a video enabled telemedicine application and verified that I am speaking with the correct person using two identifiers.  Location: Patient: *** Provider: ***   I discussed the limitations of evaluation and management by telemedicine and the availability of in person appointments. The patient expressed understanding and agreed to proceed.  History of Present Illness: Ms. Wanner is a 45 year old female with history of chronic low back pain, neck pain, pseudotumor cerebri with a spinal tap of 18 cm water.  Was taken off Diamox.  MRI of the cervical spine was normal. EMG/NCV of the left arm and leg was normal with exception of denervation of the cervical paraspinal muscle at 2 levels.  Could be related to diabetes.  11/20/2019 Dr. Jannifer Franklin: Ms. Madariaga is a 45 year old right-handed black female with a history of chronic low back pain, she has recently moved from [place].  She claims that she has 3 herniated disks in the low back, she was sent to see a neurosurgeon before she came down here but never made that appointment.  The patient recently has had onset of some neck pain and discomfort down the left arm with tingling into the left hand.  EMG and nerve conduction study on the left arm and leg was normal with exception that the patient had denervation of the cervical paraspinal muscles at 2 levels.  She has been set up for MRI of the cervical spine which has not yet been done.  She does not have the report of the prior MRI of the lumbar spine.  The patient was felt to have pseudotumor cerebri, she had a spinal tap done and the opening pressure was 18 cm of water.  The patient was taken off of her Diamox.   Observations/Objective:   Assessment and Plan:   Follow Up Instructions:    I discussed the assessment and treatment plan with the patient. The patient was provided an opportunity to ask questions and all  were answered. The patient agreed with the plan and demonstrated an understanding of the instructions.   The patient was advised to call back or seek an in-person evaluation if the symptoms worsen or if the condition fails to improve as anticipated.  I provided *** minutes of non-face-to-face time during this encounter.   Suzzanne Cloud, NP

## 2020-12-08 ENCOUNTER — Telehealth: Payer: Self-pay

## 2020-12-08 ENCOUNTER — Other Ambulatory Visit: Payer: Self-pay | Admitting: Nurse Practitioner

## 2020-12-08 DIAGNOSIS — R110 Nausea: Secondary | ICD-10-CM

## 2020-12-08 NOTE — Telephone Encounter (Signed)
Tried to pt her voicemail was  full. Per crystal we are not able to fill this medication for her.

## 2020-12-08 NOTE — Telephone Encounter (Signed)
Med refill  buprenophine   Since her primary doc is sick and can't provide it  Cannot get into the [facility] chart.

## 2020-12-10 ENCOUNTER — Other Ambulatory Visit: Payer: Self-pay | Admitting: Nurse Practitioner

## 2020-12-10 ENCOUNTER — Telehealth: Payer: Self-pay

## 2020-12-10 MED ORDER — SULFAMETHOXAZOLE-TRIMETHOPRIM 800-160 MG PO TABS: 1.0000 | ORAL_TABLET | Freq: Two times a day (BID) | ORAL | 0 refills | Status: DC

## 2020-12-10 NOTE — Telephone Encounter (Signed)
Pt needs medication for her leg

## 2020-12-13 ENCOUNTER — Emergency Department (HOSPITAL_COMMUNITY)
Admission: EM | Admit: 2020-12-13 | Discharge: 2020-12-13 | Disposition: A | Discharge status: Home / Self Care | Payer: Medicare Other | Attending: Emergency Medicine | Admitting: Emergency Medicine

## 2020-12-13 ENCOUNTER — Encounter (HOSPITAL_COMMUNITY): Payer: Self-pay | Admitting: *Deleted

## 2020-12-13 DIAGNOSIS — R509 Fever, unspecified: Secondary | ICD-10-CM | POA: Insufficient documentation

## 2020-12-13 DIAGNOSIS — R110 Nausea: Secondary | ICD-10-CM | POA: Insufficient documentation

## 2020-12-13 DIAGNOSIS — L03116 Cellulitis of left lower limb: Secondary | ICD-10-CM | POA: Diagnosis not present

## 2020-12-13 DIAGNOSIS — L03115 Cellulitis of right lower limb: Secondary | ICD-10-CM | POA: Diagnosis not present

## 2020-12-13 DIAGNOSIS — R2243 Localized swelling, mass and lump, lower limb, bilateral: Secondary | ICD-10-CM | POA: Diagnosis present

## 2020-12-13 DIAGNOSIS — Z79899 Other long term (current) drug therapy: Secondary | ICD-10-CM | POA: Insufficient documentation

## 2020-12-13 DIAGNOSIS — F17210 Nicotine dependence, cigarettes, uncomplicated: Secondary | ICD-10-CM | POA: Diagnosis not present

## 2020-12-13 DIAGNOSIS — E119 Type 2 diabetes mellitus without complications: Secondary | ICD-10-CM | POA: Diagnosis not present

## 2020-12-13 DIAGNOSIS — L03119 Cellulitis of unspecified part of limb: Secondary | ICD-10-CM

## 2020-12-13 DIAGNOSIS — J45909 Unspecified asthma, uncomplicated: Secondary | ICD-10-CM | POA: Diagnosis not present

## 2020-12-13 DIAGNOSIS — Z7984 Long term (current) use of oral hypoglycemic drugs: Secondary | ICD-10-CM | POA: Insufficient documentation

## 2020-12-13 DIAGNOSIS — Z7951 Long term (current) use of inhaled steroids: Secondary | ICD-10-CM | POA: Insufficient documentation

## 2020-12-13 DIAGNOSIS — I10 Essential (primary) hypertension: Secondary | ICD-10-CM | POA: Diagnosis not present

## 2020-12-13 LAB — COMPREHENSIVE METABOLIC PANEL
ALT: 18 U/L (ref 0–44)
AST: 16 U/L (ref 15–41)
Albumin: 4.2 g/dL (ref 3.5–5.0)
Alkaline Phosphatase: 92 U/L (ref 38–126)
Anion gap: 10 (ref 5–15)
BUN: 14 mg/dL (ref 6–20)
CO2: 25 mmol/L (ref 22–32)
Calcium: 9.2 mg/dL (ref 8.9–10.3)
Chloride: 103 mmol/L (ref 98–111)
Creatinine, Ser: 0.74 mg/dL (ref 0.44–1.00)
GFR, Estimated: >60 mL/min (ref >60)
Glucose, Bld: 152 mg/dL — ABNORMAL HIGH (ref 70–99)
Potassium: 3.2 mmol/L — ABNORMAL LOW (ref 3.5–5.1)
Sodium: 138 mmol/L (ref 135–145)
Total Bilirubin: 0.5 mg/dL (ref 0.3–1.2)
Total Protein: 7 g/dL (ref 6.5–8.1)

## 2020-12-13 LAB — CBC WITH DIFFERENTIAL/PLATELET
Abs Immature Granulocytes: 0.03 10*3/uL (ref 0.00–0.07)
Basophils Absolute: 0.1 10*3/uL (ref 0.0–0.1)
Basophils Relative: 1 %
Eosinophils Absolute: 0.4 10*3/uL (ref 0.0–0.5)
Eosinophils Relative: 4 %
HCT: 42.2 % (ref 36.0–46.0)
Hemoglobin: 13.7 g/dL (ref 12.0–15.0)
Immature Granulocytes: 0 %
Lymphocytes Relative: 33 %
Lymphs Abs: 2.9 10*3/uL (ref 0.7–4.0)
MCH: 30.4 pg (ref 26.0–34.0)
MCHC: 32.5 g/dL (ref 30.0–36.0)
MCV: 93.6 fL (ref 80.0–100.0)
Monocytes Absolute: 0.7 10*3/uL (ref 0.1–1.0)
Monocytes Relative: 8 %
Neutro Abs: 4.8 10*3/uL (ref 1.7–7.7)
Neutrophils Relative %: 54 %
Platelets: 213 10*3/uL (ref 150–400)
RBC: 4.51 MIL/uL (ref 3.87–5.11)
RDW: 11.9 % (ref 11.5–15.5)
WBC: 8.8 10*3/uL (ref 4.0–10.5)
nRBC: 0 % (ref 0.0–0.2)

## 2020-12-13 LAB — URINALYSIS, ROUTINE W REFLEX MICROSCOPIC
Bacteria, UA: NONE SEEN
Bilirubin Urine: NEGATIVE
Glucose, UA: NEGATIVE mg/dL
Hgb urine dipstick: NEGATIVE
Ketones, ur: NEGATIVE mg/dL
Leukocytes,Ua: NEGATIVE
Nitrite: NEGATIVE
Protein, ur: NEGATIVE mg/dL
Specific Gravity, Urine: 1.016 (ref 1.005–1.030)
pH: 5 (ref 5.0–8.0)

## 2020-12-13 MED ORDER — DOXYCYCLINE HYCLATE 100 MG PO TABS
100.0000 mg | ORAL_TABLET | Freq: Once | ORAL | Status: AC
  Administered 2020-12-13: 100 mg via ORAL
  Filled 2020-12-13: qty 1

## 2020-12-13 MED ORDER — DOXYCYCLINE HYCLATE 100 MG PO CAPS: 100.0000 mg | ORAL_CAPSULE | Freq: Two times a day (BID) | ORAL | 0 refills | Status: AC

## 2020-12-13 NOTE — Discharge Instructions (Signed)
Like we discussed, I think the you are likely developing cellulitis in your lower legs.  This is likely secondary from the swelling in your legs that you have been experiencing.  Please continue to take your Lasix that you are prescribed.  Elevate your legs at night.  Consider wearing compression stockings to help with the swelling in your legs.  I am prescribing you an antibiotic called doxycycline.  Please take this twice a day for the next 10 days.  Do not stop taking this medication early.  Please follow-up with your regular doctor regarding your symptoms.  If they worsen, please return to the emergency department.  It was a pleasure to meet you.

## 2020-12-13 NOTE — ED Provider Notes (Signed)
[name] DEPT Provider Note   CSN: [id number] Arrival date & time: 12/13/20  1358     History Chief Complaint  Patient presents with  . Wound Infection    Leah Barber is a 45 y.o. female.  HPI Patient is a 45 year old female with a medical history as noted below.  She presents the emergency department today due to redness and swelling in the lower extremities.  She was evaluated at urgent care and told to come to the emergency department due to possible cellulitis.  She reports chronic nausea without vomiting.  Also reports intermittent chills as well as subjective fevers.  No new chest pain or shortness of breath.  No urinary complaints.  Patient reports a history of chronic lower extremity edema and states she takes Lasix daily.    Past Medical History:  Diagnosis Date  . Allergy   . Anemia   . Anxiety   . Arthritis   . Asthma   . Chronic lower back pain   . Depression   . Diabetes mellitus without complication ([place])   . Dyspnea   . GERD (gastroesophageal reflux disease)   . Glaucoma   . Hyperlipemia   . Hypertension   . IIH (idiopathic intracranial hypertension)   . Pseudotumor cerebri 08/13/2019  . Substance abuse ([place])    Rx drugs for pain medication. Has not had in 5 years.    Patient Active Problem List   Diagnosis Date Noted  . Abnormal uterine bleeding (AUB) 11/25/2019  . Post-operative state 11/25/2019  . Excessive daytime sleepiness 09/18/2019  . Hypersomnia with sleep apnea 09/18/2019  . Chronic pain syndrome 09/09/2019  . Pseudotumor cerebri 08/13/2019  . Nonintractable headache 07/07/2019  . History of idiopathic intracranial hypertension 07/07/2019  . Intertrigo 07/07/2019  . Environmental allergies 07/07/2019  . Fibroid 07/07/2019  . Polysubstance dependence including opioid type drug with complication, episodic abuse, with unspecified complication ([place]) 38/25/0539  . Depression 09/13/2015  . Cluster B  personality disorder ([place]) 05/15/2014  . Dependence on nicotine from cigarettes 05/15/2014  . Substance induced mood disorder ([place]) 05/15/2014  . Benzodiazepine abuse ([place]) 09/20/2011  . Cannabis abuse 05/04/2011  . Cocaine abuse ([place]) 05/04/2011  . Polysubstance dependence ([place]) 05/04/2011  . Candidiasis of vagina 04/30/2011  . Chronic pain in right shoulder 04/30/2011  . Depressive disorder, not elsewhere classified 04/30/2011  . Otitis media, acute 04/30/2011    Past Surgical History:  Procedure Laterality Date  . CESAREAN SECTION     x 3  . CYSTOSCOPY Bilateral 11/25/2019   Procedure: CYSTOSCOPY;  Surgeon: Lavonia Drafts, MD;  Location: [facility];  Service: Gynecology;  Laterality: Bilateral;  . DERMOID CYST  EXCISION    . fluid removed from brain  12/20/2018  . ROBOTIC ASSISTED TOTAL HYSTERECTOMY Bilateral 11/25/2019   Procedure: XI ROBOTIC ASSISTED TOTAL HYSTERECTOMY WITH SALPINGECTOMY;  Surgeon: Lavonia Drafts, MD;  Location: [facility];  Service: Gynecology;  Laterality: Bilateral;     OB History    Gravida  4   Para  3   Term  3   Preterm      AB  1   Living  3     SAB  1   IAB      Ectopic      Multiple      Live Births  3           Family History  Problem Relation Age of Onset  . Diabetes Mother   .  Hypertension Mother   . Cancer Father   . Liver cancer Father   . Colon cancer Neg Hx   . Esophageal cancer Neg Hx   . Rectal cancer Neg Hx   . Stomach cancer Neg Hx   . Pancreatic cancer Neg Hx     Social History   Tobacco Use  . Smoking status: Current Every Day Smoker    Packs/day: 1.00    Types: Cigarettes  . Smokeless tobacco: Never Used  Vaping Use  . Vaping Use: Never used  Substance Use Topics  . Alcohol use: Yes    Comment: occasional  . Drug use: No    Home Medications Prior to Admission medications   Medication Sig Start Date End Date Taking? Authorizing Provider   doxycycline (VIBRAMYCIN) 100 MG capsule Take 1 capsule (100 mg total) by mouth 2 (two) times daily for 10 days. 12/13/20 12/23/20 Yes Rayna Sexton, PA-C  Albuterol Sulfate (PROAIR RESPICLICK) 073 (90 Base) MCG/ACT AEPB Inhale 2 puffs into the lungs 4 (four) times daily as needed. 05/17/20 05/17/21  [name], NP  amLODipine (NORVASC) 10 MG tablet Take 1 tablet (10 mg total) by mouth daily. 05/17/20 05/17/21  [name], NP  buprenorphine (SUBUTEX) 8 MG SUBL SL tablet Place 8 mg under the tongue every 8 (eight) hours. 03/31/20   [provider]  cetirizine (ZYRTEC) 10 MG tablet Take 1 tablet (10 mg total) by mouth daily. 05/17/20 05/17/21  [name], NP  cloNIDine (CATAPRES) 0.1 MG tablet Take 1 tablet (0.1 mg total) by mouth 3 (three) times daily. 05/17/20 05/17/21  [name], NP  CONCERTA 18 MG CR tablet Take 18 mg by mouth at bedtime. 05/17/20   [provider]  CONCERTA 36 MG CR tablet Take 36 mg by mouth daily as needed (ADHD).  10/13/19   [provider]  DULoxetine (CYMBALTA) 60 MG capsule Take 1 capsule (60 mg total) by mouth 2 (two) times daily. 05/17/20   [name], NP  FLUoxetine (PROZAC) 40 MG capsule Take 1 capsule (40 mg total) by mouth every morning. 05/17/20 08/15/20  [name], NP  fluticasone [facility]) 50 MCG/ACT nasal spray USE 2 SPRAYS IN [facility] NOSTRIL DAILY 11/03/20   [name], NP  furosemide (LASIX) 40 MG tablet Take 1 tablet (40 mg total) by mouth daily. 11/11/20   [name], PA-C  gabapentin (NEURONTIN) 400 MG capsule Take 2 capsules (800 mg total) by mouth 3 (three) times daily. 05/17/20 05/17/21  [name], NP  hydrOXYzine (VISTARIL) 50 MG capsule Take 50 mg by mouth 4 (four) times daily as needed for anxiety.  11/26/19   [provider]  ibuprofen (ADVIL) 800 MG tablet TAKE 1 TABLET BY MOUTH EVERY 8 HOURS AS NEEDED 10/19/20   Azzie Glatter, FNP  latanoprost (XALATAN) 0.005 % ophthalmic solution Place 1  drop into both eyes at bedtime. 05/17/20   [name], NP  metFORMIN (GLUCOPHAGE) 500 MG tablet Take 1 tablet (500 mg total) by mouth 2 (two) times daily with a meal. 05/17/20 05/17/21  [name], NP  Multiple Vitamins-Minerals (WOMENS MULTIVITAMIN PO) Take 1 tablet by mouth daily.     [provider]  omeprazole (PRILOSEC) 40 MG capsule Take 1 capsule (40 mg total) by mouth daily. 05/17/20 05/17/21  [name], NP  ondansetron (ZOFRAN) 4 MG tablet TAKE 1 TABLET (4 MG TOTAL) BY MOUTH EVERY 6 (SIX) HOURS AS NEEDED FOR NAUSEA OR  VOMITING. 12/08/20   [name], NP  rosuvastatin (CRESTOR) 5 MG tablet Take 1 tablet (5 mg total) by mouth daily. 05/20/20 05/20/21  [name], NP  spironolactone (ALDACTONE) 25 MG tablet Take 1 tablet (25 mg total) by mouth daily for 7 days. 08/05/20 08/12/20  [name], NP  zolpidem (AMBIEN) 5 MG tablet Take 1 tablet (5 mg total) by mouth at bedtime as needed for up to 5 days for sleep. 03/23/20 03/28/20  Dorena Dew, FNP    Allergies    Clindamycin/lincomycin, Other, Hydrocodone, and Morphine  Review of Systems   Review of Systems  All other systems reviewed and are negative. Ten systems reviewed and are negative for acute change, except as noted in the HPI.    Physical Exam Updated Vital Signs BP (!) 138/98   Pulse 88   Temp 98 F (36.7 C) (Oral)   Resp 18   LMP 09/14/2019 (Approximate)   SpO2 98%   Physical Exam Vitals and nursing note reviewed.  Constitutional:      General: She is not in acute distress.    Appearance: Normal appearance. She is not ill-appearing, toxic-appearing or diaphoretic.  HENT:     Head: Normocephalic and atraumatic.     Right Ear: External ear normal.     Left Ear: External ear normal.     Nose: Nose normal.     Mouth/Throat:     Mouth: Mucous membranes are moist.     Pharynx: Oropharynx is clear. No oropharyngeal exudate or posterior oropharyngeal erythema.  Eyes:     Extraocular  Movements: Extraocular movements intact.  Cardiovascular:     Rate and Rhythm: Normal rate and regular rhythm.     Pulses: Normal pulses.     Heart sounds: Normal heart sounds. No murmur heard. No friction rub. No gallop.   Pulmonary:     Effort: Pulmonary effort is normal. No respiratory distress.     Breath sounds: Normal breath sounds. No stridor. No wheezing, rhonchi or rales.  Abdominal:     General: Abdomen is flat.     Palpations: Abdomen is soft.     Tenderness: There is no abdominal tenderness.  Musculoskeletal:        General: Normal range of motion.     Cervical back: Normal range of motion and neck supple. No tenderness.     Right lower leg: Edema present.     Left lower leg: Edema present.     Comments: 1+ pitting edema in the bilateral lower extremities up to the mid calf.  Mild diffuse erythema noted circumferentially in the lower legs bilaterally.  Lower legs are tender to palpation diffusely.  Skin:    General: Skin is warm and dry.     Findings: Erythema present.  Neurological:     General: No focal deficit present.     Mental Status: She is alert and oriented to person, place, and time.  Psychiatric:        Mood and Affect: Mood normal.        Behavior: Behavior normal.    ED Results / Procedures / Treatments   Labs (all labs ordered are listed, but only abnormal results are displayed) Labs Reviewed  COMPREHENSIVE METABOLIC PANEL - Abnormal; Notable for the following components:      Result Value   Potassium 3.2 (*)    Glucose, Bld 152 (*)    All other components within normal limits  CBC WITH DIFFERENTIAL/PLATELET  URINALYSIS, ROUTINE  W REFLEX MICROSCOPIC    EKG None  Radiology No results found.  Procedures Procedures   Medications Ordered in ED Medications  doxycycline (VIBRA-TABS) tablet 100 mg (100 mg Oral Given 12/13/20 1827)    ED Course  I have reviewed the triage vital signs and the nursing notes.  Pertinent labs & imaging results  that were available during my care of the patient were reviewed by me and considered in my medical decision making (see chart for details).    MDM Rules/Calculators/A&P                          Pt is a 45 y.o. female who presents to the emergency department what appears to be cellulitis in the lower extremities.  Labs: CBC with differential without abnormalities. CMP with a potassium of 3.2 and a glucose of 152. UA negative.  I, Rayna Sexton, PA-C, personally reviewed and evaluated these images and lab results as part of my medical decision-making.  Symptoms today appear consistent with cellulitis.  She is afebrile and has no leukocytosis or neutrophilia.  Mild tachycardia upon arrival that has since resolved.  She reports some mild nausea and chills but states that these are chronic issues and are not new.  Started the patient on a course of doxycycline.  First dose given in the emergency department.  We discussed signs and symptoms of systemic infection and patient knows that if these develop she needs to return to the emergency department immediately for reevaluation.  Feel the patient is stable for discharge at this time and she is agreeable.  Recommended PCP follow-up.  Her questions were answered and she was amicable at the time of discharge.  Note: Portions of this report may have been transcribed using voice recognition software. Every effort was made to ensure accuracy; however, inadvertent computerized transcription errors may be present.   Final Clinical Impression(s) / ED Diagnoses Final diagnoses:  Cellulitis of lower extremity, unspecified laterality    Rx / DC Orders ED Discharge Orders         Ordered    doxycycline (VIBRAMYCIN) 100 MG capsule  2 times daily        12/13/20 1823           Rayna Sexton, PA-C 12/13/20 1836    Leah Rank, MD 12/14/20 [phone]

## 2020-12-13 NOTE — ED Notes (Signed)
Warm blanket provided and chair laid back

## 2020-12-13 NOTE — ED Triage Notes (Addendum)
Emergency Medicine Provider Triage Evaluation Note  Leah Barber , a 45 y.o. female  was evaluated in triage.  Pt complains of pain and swelling in the bilateral lower extremities.  Patient is a type II diabetic.  She states she has been having chronic nausea as well as chills and subjective fevers.  No chest pain or shortness of breath.  No urinary complaints.  She was evaluated in urgent care and sent to the emergency department for further evaluation regarding possible cellulitis.  Physical Exam  BP (!) 175/105 (BP Location: Left Arm)   Pulse (!) 108   Temp 98.2 F (36.8 C) (Oral)   Resp 18   LMP 09/14/2019 (Approximate)   SpO2 97%  Gen:   Awake, no distress   HEENT:  Atraumatic  Resp:  Normal effort  Cardiac:  Normal rate  Abd:   Nondistended, nontender  MSK:   Moves extremities without difficulty, 1+ edema in the bilateral lower extremities. Mild erythema in the region as well as circumferential tenderness. Neuro:  Speech clear   Medical Decision Making  Medically screening exam initiated at 4:30 PM.  Appropriate orders placed.  Leah Barber was informed that the remainder of the evaluation will be completed by another provider, this initial triage assessment does not replace that evaluation, and the importance of remaining in the ED until their evaluation is complete.   Rayna Sexton, PA-C 12/13/20 1631    Rayna Sexton, PA-C 12/13/20 1830

## 2020-12-13 NOTE — ED Triage Notes (Signed)
Pt complains of right leg wound, swelling. Pt was sent by urgent care d/t concern for cellulitis.

## 2020-12-20 ENCOUNTER — Other Ambulatory Visit: Payer: Self-pay | Admitting: Nurse Practitioner

## 2020-12-21 ENCOUNTER — Other Ambulatory Visit: Payer: Self-pay | Admitting: Nurse Practitioner

## 2020-12-27 ENCOUNTER — Other Ambulatory Visit: Payer: Self-pay | Admitting: Nurse Practitioner

## 2020-12-27 ENCOUNTER — Telehealth: Payer: Self-pay

## 2020-12-27 MED ORDER — FLUCONAZOLE 150 MG PO TABS: 150.0000 mg | ORAL_TABLET | Freq: Once | ORAL | 0 refills | Status: AC

## 2020-12-27 NOTE — Telephone Encounter (Signed)
Pt when to the urgent care and was given an antibiotic and has develop an yeast infection she wanted to know if she can have something sent to the pharmacy regarding this , please advise

## 2020-12-27 NOTE — Telephone Encounter (Signed)
Sent!

## 2020-12-27 NOTE — Telephone Encounter (Signed)
Pt needs yeast infection medicine  Diflucan

## 2020-12-27 NOTE — Telephone Encounter (Signed)
Called and notified pt

## 2021-01-03 ENCOUNTER — Telehealth: Payer: Medicaid Other | Admitting: Neurology

## 2021-01-03 NOTE — Progress Notes (Deleted)
Virtual Visit via Video Note  I connected with Chalmers Cater on 01/03/21 at  3:15 PM EDT by a video enabled telemedicine application and verified that I am speaking with the correct person using two identifiers.  Location: Patient: *** Provider: ***   I discussed the limitations of evaluation and management by telemedicine and the availability of in person appointments. The patient expressed understanding and agreed to proceed.  History of Present Illness: 01/03/2021 SS: Ms. Leflore is a 45 year old female with history of chronic low back pain.  Has had neck pain, radiating down the left arm with tingling. NCV, EMG were normal except denervation of the cervical paraspinal muscles at 2 levels.  Reports pseudotumor cerebri, taken off Diamox.  MRI of cervical spine was normal., EMG changes could be related to hypoglycemia.   11/20/2019 Dr. Jannifer Franklin: Ms. Fassnacht is a 45 year old right-handed black female with a history of chronic low back pain, she has recently moved from [place].  She claims that she has 3 herniated disks in the low back, she was sent to see a neurosurgeon before she came down here but never made that appointment.  The patient recently has had onset of some neck pain and discomfort down the left arm with tingling into the left hand.  EMG and nerve conduction study on the left arm and leg was normal with exception that the patient had denervation of the cervical paraspinal muscles at 2 levels.  She has been set up for MRI of the cervical spine which has not yet been done.  She does not have the report of the prior MRI of the lumbar spine.  The patient was felt to have pseudotumor cerebri, she had a spinal tap done and the opening pressure was 18 cm of water.  The patient was taken off of her Diamox.   Observations/Objective:   Assessment and Plan:   Follow Up Instructions:    I discussed the assessment and treatment plan with the patient. The patient was provided an opportunity to ask  questions and all were answered. The patient agreed with the plan and demonstrated an understanding of the instructions.   The patient was advised to call back or seek an in-person evaluation if the symptoms worsen or if the condition fails to improve as anticipated.  I provided *** minutes of non-face-to-face time during this encounter.   Suzzanne Cloud, NP

## 2021-01-06 ENCOUNTER — Ambulatory Visit: Payer: Medicare Other | Admitting: Nurse Practitioner

## 2021-01-11 ENCOUNTER — Ambulatory Visit (INDEPENDENT_AMBULATORY_CARE_PROVIDER_SITE_OTHER): Payer: Medicare Other | Admitting: Nurse Practitioner

## 2021-01-11 ENCOUNTER — Ambulatory Visit: Payer: Medicare Other

## 2021-01-11 ENCOUNTER — Encounter: Payer: Self-pay | Admitting: Nurse Practitioner

## 2021-01-11 VITALS — BP 126/96 | HR 103 | Temp 97.9°F | Resp 18 | Ht 62.0 in | Wt 211.0 lb

## 2021-01-11 DIAGNOSIS — Z0000 Encounter for general adult medical examination without abnormal findings: Secondary | ICD-10-CM

## 2021-01-11 NOTE — Progress Notes (Signed)
Subjective:   Leah Barber is a 45 y.o. female who presents for Medicare Annual (Subsequent) preventive examination.  Review of Systems    Review of Systems  Constitutional: Negative.   HENT: Negative.   Respiratory: Negative.   Cardiovascular: Negative.   Gastrointestinal: Negative.   Musculoskeletal: Positive for myalgias.  Skin: Negative.   Neurological: Negative.   Psychiatric/Behavioral: Negative.           Objective:    There were no vitals filed for this visit. There is no height or weight on file to calculate BMI.  Advanced Directives 11/11/2020 01/22/2020 11/25/2019 10/26/2019 09/23/2019 06/09/2019 12/29/2016  Does Patient Have a Medical Advance Directive? No No No No No Unable to assess, patient is non-responsive or altered mental status No  Would patient like information on creating a medical advance directive? No - Patient declined - No - Patient declined - No - Patient declined - -    Current Medications (verified) Outpatient Encounter Medications as of 01/11/2021  Medication Sig  . Albuterol Sulfate (PROAIR RESPICLICK) 037 (90 Base) MCG/ACT AEPB Inhale 2 puffs into the lungs 4 (four) times daily as needed.  Marland Kitchen amLODipine (NORVASC) 10 MG tablet Take 1 tablet (10 mg total) by mouth daily.  . buprenorphine (SUBUTEX) 8 MG SUBL SL tablet Place 8 mg under the tongue every 8 (eight) hours.  . celecoxib (CELEBREX) 200 MG capsule TAKE 1 CAPSULE BY MOUTH TWICE A DAY  . cetirizine (ZYRTEC) 10 MG tablet Take 1 tablet (10 mg total) by mouth daily.  . cloNIDine (CATAPRES) 0.1 MG tablet Take 1 tablet (0.1 mg total) by mouth 3 (three) times daily.  . CONCERTA 18 MG CR tablet Take 18 mg by mouth at bedtime.  . CONCERTA 36 MG CR tablet Take 36 mg by mouth daily as needed (ADHD).   . DULoxetine (CYMBALTA) 60 MG capsule Take 1 capsule (60 mg total) by mouth 2 (two) times daily.  Marland Kitchen FLUoxetine (PROZAC) 20 MG capsule TAKE 3 CAPSULES EVERY MORNING  . FLUoxetine (PROZAC) 40 MG capsule Take  1 capsule (40 mg total) by mouth every morning.  . fluticasone (FLONASE) 50 MCG/ACT nasal spray USE 2 SPRAYS IN EACH NOSTRIL DAILY  . furosemide (LASIX) 40 MG tablet Take 1 tablet (40 mg total) by mouth daily.  Marland Kitchen gabapentin (NEURONTIN) 400 MG capsule Take 2 capsules (800 mg total) by mouth 3 (three) times daily.  . hydrOXYzine (VISTARIL) 50 MG capsule Take 50 mg by mouth 4 (four) times daily as needed for anxiety.   Marland Kitchen ibuprofen (ADVIL) 800 MG tablet TAKE 1 TABLET BY MOUTH EVERY 8 HOURS AS NEEDED  . latanoprost (XALATAN) 0.005 % ophthalmic solution Place 1 drop into both eyes at bedtime.  . metFORMIN (GLUCOPHAGE) 500 MG tablet Take 1 tablet (500 mg total) by mouth 2 (two) times daily with a meal.  . Multiple Vitamins-Minerals (WOMENS MULTIVITAMIN PO) Take 1 tablet by mouth daily.   Marland Kitchen omeprazole (PRILOSEC) 40 MG capsule Take 1 capsule (40 mg total) by mouth daily.  . ondansetron (ZOFRAN) 4 MG tablet TAKE 1 TABLET (4 MG TOTAL) BY MOUTH EVERY 6 (SIX) HOURS AS NEEDED FOR NAUSEA OR VOMITING.  . rosuvastatin (CRESTOR) 5 MG tablet Take 1 tablet (5 mg total) by mouth daily.  Marland Kitchen spironolactone (ALDACTONE) 25 MG tablet Take 1 tablet (25 mg total) by mouth daily for 7 days.  Marland Kitchen zolpidem (AMBIEN) 5 MG tablet Take 1 tablet (5 mg total) by mouth at bedtime as needed for up  to 5 days for sleep.   No facility-administered encounter medications on file as of 01/11/2021.    Allergies (verified) Clindamycin/lincomycin, Other, Hydrocodone, and Morphine   History: Past Medical History:  Diagnosis Date  . Allergy   . Anemia   . Anxiety   . Arthritis   . Asthma   . Chronic lower back pain   . Depression   . Diabetes mellitus without complication ([place])   . Dyspnea   . GERD (gastroesophageal reflux disease)   . Glaucoma   . Hyperlipemia   . Hypertension   . IIH (idiopathic intracranial hypertension)   . Pseudotumor cerebri 08/13/2019  . Substance abuse ([place])    Rx drugs for pain medication. Has not had in  5 years.   Past Surgical History:  Procedure Laterality Date  . CESAREAN SECTION     x 3  . CYSTOSCOPY Bilateral 11/25/2019   Procedure: CYSTOSCOPY;  Surgeon: Lavonia Drafts, MD;  Location[facility];  Service: Gynecology;  Laterality: Bilateral;  . DERMOID CYST  EXCISION    . fluid removed from brain  12/20/2018  . ROBOTIC ASSISTED TOTAL HYSTERECTOMY Bilateral 11/25/2019   Procedure: XI ROBOTIC ASSISTED TOTAL HYSTERECTOMY WITH SALPINGECTOMY;  Surgeon: Lavonia Drafts, MD;  Location: [facility];  Service: Gynecology;  Laterality: Bilateral;   Family History  Problem Relation Age of Onset  . Diabetes Mother   . Hypertension Mother   . Cancer Father   . Liver cancer Father   . Colon cancer Neg Hx   . Esophageal cancer Neg Hx   . Rectal cancer Neg Hx   . Stomach cancer Neg Hx   . Pancreatic cancer Neg Hx    Social History   Socioeconomic History  . Marital status: Legally Separated    Spouse name: Not on file  . Number of children: Not on file  . Years of education: Not on file  . Highest education level: Not on file  Occupational History  . Not on file  Tobacco Use  . Smoking status: Current Every Day Smoker    Packs/day: 1.00    Types: Cigarettes  . Smokeless tobacco: Never Used  Vaping Use  . Vaping Use: Never used  Substance and Sexual Activity  . Alcohol use: Yes    Comment: occasional  . Drug use: No  . Sexual activity: Not Currently  Other Topics Concern  . Not on file  Social History Narrative  . Not on file   Social Determinants of Health   Financial Resource Strain: Not on file  Food Insecurity: Not on file  Transportation Needs: Not on file  Physical Activity: Not on file  Stress: Not on file  Social Connections: Not on file    Tobacco Counseling Ready to quit: Not Answered Counseling given: Not Answered   Clinical Intake:                 Diabetic?yes         Activities of Daily  Living No flowsheet data found.  Patient Care Team: [name], NP as PCP - General (Adult Health Nurse Practitioner) Monna Fam, MD as Consulting Physician (Ophthalmology)  Indicate any recent Medical Services you may have received from other than [facility] providers in the past year (date may be approximate).     Assessment:   This is a routine wellness examination for Leah Barber.  Hearing/Vision screen No exam data present  Dietary issues and exercise activities discussed:    Goals Addressed  None    Depression Screen PHQ 2/9 Scores 06/17/2020 03/23/2020 02/17/2020 11/11/2019 10/06/2019 09/09/2019 08/05/2019  PHQ - 2 Score 0 0 0 1 2 1 1   PHQ- 9 Score - - - - 11 - -  Exception Documentation Medical reason - - - - - -    Fall Risk Fall Risk  06/17/2020 03/23/2020 02/17/2020 11/11/2019 09/09/2019  Falls in the past year? 1 0 0 1 0  Number falls in past yr: 1 - - 1 -  Injury with Fall? 0 - - 1 -  Risk for fall due to : No Fall Risks - - - -  Follow up - - Falls evaluation completed - -    FALL RISK PREVENTION PERTAINING TO THE HOME:  Any stairs in or around the home? Yes  If so, are there any without handrails? Yes  Home free of loose throw rugs in walkways, pet beds, electrical cords, etc? Yes  Adequate lighting in your home to reduce risk of falls? Yes   ASSISTIVE DEVICES UTILIZED TO PREVENT FALLS:  Life alert? No  Use of a cane, walker or w/c? No  Grab bars in the bathroom? No  Shower chair or bench in shower? No  Elevated toilet seat or a handicapped toilet? No   TIMED UP AND GO:  Was the test performed? Yes .  Length of time to ambulate 10 feet: 4 sec.   Gait steady and fast without use of assistive device  Cognitive Function:        Immunizations Immunization History  Administered Date(s) Administered  . Tdap 10/06/2019    TDAP status: Up to date  Flu Vaccine status: Due, Education has been provided regarding the importance of this vaccine.  Advised may receive this vaccine at local pharmacy or Health Dept. Aware to provide a copy of the vaccination record if obtained from local pharmacy or Health Dept. Verbalized acceptance and understanding.  {Pneumococcal vaccine status: not due yet  Covid-19 vaccine status: Completed vaccines  Qualifies for Shingles Vaccine? No   Zostavax completed No   Shingrix Completed?: No.    Education has been provided regarding the importance of this vaccine. Patient has been advised to call insurance company to determine out of pocket expense if they have not yet received this vaccine. Advised may also receive vaccine at local pharmacy or Health Dept. Verbalized acceptance and understanding.  Screening Tests Health Maintenance  Topic Date Due  . COVID-19 Vaccine (1) Never done  . INFLUENZA VACCINE  03/21/2021  . PAP SMEAR-Modifier  08/06/2023  . TETANUS/TDAP  10/05/2029  . COLONOSCOPY (Pts 45-35yrs Insurance coverage will need to be confirmed)  04/05/2030  . Hepatitis C Screening  Completed  . HIV Screening  Completed  . HPV VACCINES  Aged Out    Health Maintenance  Health Maintenance Due  Topic Date Due  . COVID-19 Vaccine (1) Never done    Colorectal cancer screening: Type of screening: Colonoscopy. Completed 2021. Repeat every 5 years  Mammogram status: Completed 2017. Repeat every year    Lung Cancer Screening: (Low Dose CT Chest recommended if Age 2-80 years, 30 pack-year currently smoking OR have quit w/in 15years.) does not qualify.   Lung Cancer Screening Referral: NA  Additional Screening:  Hepatitis C Screening: does qualify; please follow at PCP  Vision Screening: Recommended annual ophthalmology exams for early detection of glaucoma and other disorders of the eye. Is the patient up to date with their annual eye exam?  Yes  Who is  the provider or what is the name of the office in which the patient attends annual eye exams?  If pt is not established with a provider,  would they like to be referred to a provider to establish care? Yes .   Dental Screening: Recommended annual dental exams for proper oral hygiene  Community Resource Referral / Chronic Care Management: CRR required this visit?  Yes   CCM required this visit?  Yes      Plan:     I have personally reviewed and noted the following in the patient's chart:   . Medical and social history . Use of alcohol, tobacco or illicit drugs  . Current medications and supplements including opioid prescriptions.  . Functional ability and status . Nutritional status . Physical activity . Advanced directives . List of other physicians . Hospitalizations, surgeries, and ER visits in previous 12 months . Vitals . Screenings to include cognitive, depression, and falls . Referrals and appointments  In addition, I have reviewed and discussed with patient certain preventive protocols, quality metrics, and best practice recommendations. A written personalized care plan for preventive services as well as general preventive health recommendations were provided to patient.     Fenton Foy, NP   01/11/2021   Nurse Notes:

## 2021-01-11 NOTE — Patient Instructions (Addendum)
Leah Barber , Thank you for taking time to come for your Medicare Wellness Visit. I appreciate your ongoing commitment to your health goals. Please review the following plan we discussed and let me know if I can assist you in the future.   These are the goals we discussed: Goals    . DIET - REDUCE FAT INTAKE       This is a list of the screening recommended for you and due dates:  Health Maintenance  Topic Date Due  . COVID-19 Vaccine (1) Never done  . Flu Shot  03/21/2021  . Pap Smear  08/06/2023  . Tetanus Vaccine  10/05/2029  . Colon Cancer Screening  04/05/2030  . Hepatitis C Screening: USPSTF Recommendation to screen - Ages 87-79 yo.  Completed  . HIV Screening  Completed  . HPV Vaccine  Aged Out    Fat and Cholesterol Restricted Eating Plan Getting too much fat and cholesterol in your diet may cause health problems. Choosing the right foods helps keep your fat and cholesterol at normal levels. This can keep you from getting certain diseases. Your doctor may recommend an eating plan that includes:  Total fat: ______% or less of total calories a day.  Saturated fat: ______% or less of total calories a day.  Cholesterol: less than _________mg a day.  Fiber: ______g a day. What are tips for following this plan? Meal planning  At meals, divide your plate into four equal parts: ? Fill one-half of your plate with vegetables and green salads. ? Fill one-fourth of your plate with whole grains. ? Fill one-fourth of your plate with low-fat (lean) protein foods.  Eat fish that is high in omega-3 fats at least two times a week. This includes mackerel, tuna, sardines, and salmon.  Eat foods that are high in fiber, such as whole grains, beans, apples, broccoli, carrots, peas, and barley. General tips  Work with your doctor to lose weight if you need to.  Avoid: ? Foods with added sugar. ? Fried foods. ? Foods with partially hydrogenated oils.  Limit alcohol intake to no  more than 1 drink a day for nonpregnant women and 2 drinks a day for men. One drink equals 12 oz of beer, 5 oz of wine, or 1 oz of hard liquor.   Reading food labels  Check food labels for: ? Trans fats. ? Partially hydrogenated oils. ? Saturated fat (g) in each serving. ? Cholesterol (mg) in each serving. ? Fiber (g) in each serving.  Choose foods with healthy fats, such as: ? Monounsaturated fats. ? Polyunsaturated fats. ? Omega-3 fats.  Choose grain products that have whole grains. Look for the word "whole" as the first word in the ingredient list. Cooking  Cook foods using low-fat methods. These include baking, boiling, grilling, and broiling.  Eat more home-cooked foods. Eat at restaurants and buffets less often.  Avoid cooking using saturated fats, such as butter, cream, palm oil, palm kernel oil, and coconut oil. Recommended foods Fruits  All fresh, canned (in natural juice), or frozen fruits. Vegetables  Fresh or frozen vegetables (raw, steamed, roasted, or grilled). Green salads. Grains  Whole grains, such as whole wheat or whole grain breads, crackers, cereals, and pasta. Unsweetened oatmeal, bulgur, barley, quinoa, or Leah Barber. Corn or whole wheat flour tortillas. Meats and other protein foods  Ground beef (85% or leaner), grass-fed beef, or beef trimmed of fat. Skinless chicken or [place]. Ground chicken or [place]. Pork trimmed of fat. All fish  and seafood. Egg whites. Dried beans, peas, or lentils. Unsalted nuts or seeds. Unsalted canned beans. Nut butters without added sugar or oil. Dairy  Low-fat or nonfat dairy products, such as skim or 1% milk, 2% or reduced-fat cheeses, low-fat and fat-free ricotta or cottage cheese, or plain low-fat and nonfat yogurt. Fats and oils  Tub margarine without trans fats. Light or reduced-fat mayonnaise and salad dressings. Avocado. Olive, canola, sesame, or safflower oils. The items listed above may not be a complete list of  foods and beverages you can eat. Contact a dietitian for more information.   Foods to avoid Fruits  Canned fruit in heavy syrup. Fruit in cream or butter sauce. Fried fruit. Vegetables  Vegetables cooked in cheese, cream, or butter sauce. Fried vegetables. Grains  White bread. White pasta. White Barber. Cornbread. Bagels, pastries, and croissants. Crackers and snack foods that contain trans fat and hydrogenated oils. Meats and other protein foods  Fatty cuts of meat. Ribs, chicken wings, bacon, sausage, bologna, salami, chitterlings, fatback, hot dogs, bratwurst, and packaged lunch meats. Liver and organ meats. Whole eggs and egg yolks. Chicken and [place] with skin. Fried meat. Dairy  Whole or 2% milk, cream, half-and-half, and cream cheese. Whole milk cheeses. Whole-fat or sweetened yogurt. Full-fat cheeses. Nondairy creamers and whipped toppings. Processed cheese, cheese spreads, and cheese curds. Beverages  Alcohol. Sugar-sweetened drinks such as sodas, lemonade, and fruit drinks. Fats and oils  Butter, stick margarine, lard, shortening, ghee, or bacon fat. Coconut, palm kernel, and palm oils. Sweets and desserts  Corn syrup, sugars, honey, and molasses. Candy. Jam and jelly. Syrup. Sweetened cereals. Cookies, pies, cakes, donuts, muffins, and ice cream. The items listed above may not be a complete list of foods and beverages you should avoid. Contact a dietitian for more information. Summary  Choosing the right foods helps keep your fat and cholesterol at normal levels. This can keep you from getting certain diseases.  At meals, fill one-half of your plate with vegetables and green salads.  Eat high-fiber foods, like whole grains, beans, apples, carrots, peas, and barley.  Limit added sugar, saturated fats, alcohol, and fried foods. This information is not intended to replace advice given to you by your health care provider. Make sure you discuss any questions you have with your  health care provider. Document Revised: 12/10/2019 Document Reviewed: 12/10/2019 Elsevier Patient Education  2021 Leah Barber.   Diabetes Mellitus and Leah Barber care is an important part of your health, especially when you have diabetes. Diabetes may cause you to have problems because of poor blood flow (circulation) to your feet and legs, which can cause your skin to:  Become thinner and drier.  Break more easily.  Heal more slowly.  Peel and crack. You may also have nerve damage (neuropathy) in your legs and feet, causing decreased feeling in them. This means that you may not notice minor injuries to your feet that could lead to more serious problems. Noticing and addressing any potential problems early is the best way to prevent future foot problems. How to care for your feet Foot hygiene  Wash your feet daily with warm water and mild soap. Do not use hot water. Then, pat your feet and the areas between your toes until they are completely dry. Do not soak your feet as this can dry your skin.  Trim your toenails straight across. Do not dig under them or around the cuticle. File the edges of your nails with an Tax adviser  or nail file.  Apply a moisturizing lotion or petroleum jelly to the skin on your feet and to dry, brittle toenails. Use lotion that does not contain alcohol and is unscented. Do not apply lotion between your toes.   Shoes and socks  Wear clean socks or stockings every day. Make sure they are not too tight. Do not wear knee-high stockings since they may decrease blood flow to your legs.  Wear shoes that fit properly and have enough cushioning. Always look in your shoes before you put them on to be sure there are no objects inside.  To break in new shoes, wear them for just a few hours a day. This prevents injuries on your feet. Wounds, scrapes, corns, and calluses  Check your feet daily for blisters, cuts, bruises, sores, and redness. If you cannot see the  bottom of your feet, use a mirror or ask someone for help.  Do not cut corns or calluses or try to remove them with medicine.  If you find a minor scrape, cut, or break in the skin on your feet, keep it and the skin around it clean and dry. You may clean these areas with mild soap and water. Do not clean the area with peroxide, alcohol, or iodine.  If you have a wound, scrape, corn, or callus on your foot, look at it several times a day to make sure it is healing and not infected. Check for: ? Redness, swelling, or pain. ? Fluid or blood. ? Warmth. ? Pus or a bad smell.   General tips  Do not cross your legs. This may decrease blood flow to your feet.  Do not use heating pads or hot water bottles on your feet. They may burn your skin. If you have lost feeling in your feet or legs, you may not know this is happening until it is too late.  Protect your feet from hot and cold by wearing shoes, such as at the beach or on hot pavement.  Schedule a complete foot exam at least once a year (annually) or more often if you have foot problems. Report any cuts, sores, or bruises to your health care provider immediately. Where to find more information  American Diabetes Association: www.diabetes.org  Association of Diabetes Care & Education Specialists: www.diabeteseducator.org Contact a health care provider if:  You have a medical condition that increases your risk of infection and you have any cuts, sores, or bruises on your feet.  You have an injury that is not healing.  You have redness on your legs or feet.  You feel burning or tingling in your legs or feet.  You have pain or cramps in your legs and feet.  Your legs or feet are numb.  Your feet always feel cold.  You have pain around any toenails. Get help right away if:  You have a wound, scrape, corn, or callus on your foot and: ? You have pain, swelling, or redness that gets worse. ? You have fluid or blood coming from the  wound, scrape, corn, or callus. ? Your wound, scrape, corn, or callus feels warm to the touch. ? You have pus or a bad smell coming from the wound, scrape, corn, or callus. ? You have a fever. ? You have a red line going up your leg. Summary  Check your feet every day for blisters, cuts, bruises, sores, and redness.  Apply a moisturizing lotion or petroleum jelly to the skin on your feet and to dry,  brittle toenails.  Wear shoes that fit properly and have enough cushioning.  If you have foot problems, report any cuts, sores, or bruises to your health care provider immediately.  Schedule a complete foot exam at least once a year (annually) or more often if you have foot problems. This information is not intended to replace advice given to you by your health care provider. Make sure you discuss any questions you have with your health care provider. Document Revised: 02/26/2020 Document Reviewed: 02/26/2020 Elsevier Patient Education  2021 [place] Prevention in the Home, Adult Falls can cause injuries and can happen to people of all ages. There are many things you can do to make your home safe and to help prevent falls. Ask for help when making these changes. What actions can I take to prevent falls? General Instructions  Use good lighting in all rooms. Replace any light bulbs that burn out.  Turn on the lights in dark areas. Use night-lights.  Keep items that you use often in easy-to-reach places. Lower the shelves around your home if needed.  Set up your furniture so you have a clear path. Avoid moving your furniture around.  Do not have throw rugs or other things on the floor that can make you trip.  Avoid walking on wet floors.  If any of your floors are uneven, fix them.  Add color or contrast paint or tape to clearly mark and help you see: ? Grab bars or handrails. ? First and last steps of staircases. ? Where the edge of each step is.  If you use a  stepladder: ? Make sure that it is fully opened. Do not climb a closed stepladder. ? Make sure the sides of the stepladder are locked in place. ? Ask someone to hold the stepladder while you use it.  Know where your pets are when moving through your home. What can I do in the bathroom?  Keep the floor dry. Clean up any water on the floor right away.  Remove soap buildup in the tub or shower.  Use nonskid mats or decals on the floor of the tub or shower.  Attach bath mats securely with double-sided, nonslip rug tape.  If you need to sit down in the shower, use a plastic, nonslip stool.  Install grab bars by the toilet and in the tub and shower. Do not use towel bars as grab bars.      What can I do in the bedroom?  Make sure that you have a light by your bed that is easy to reach.  Do not use any sheets or blankets for your bed that hang to the floor.  Have a firm chair with side arms that you can use for support when you get dressed. What can I do in the kitchen?  Clean up any spills right away.  If you need to reach something above you, use a step stool with a grab bar.  Keep electrical cords out of the way.  Do not use floor polish or wax that makes floors slippery. What can I do with my stairs?  Do not leave any items on the stairs.  Make sure that you have a light switch at the top and the bottom of the stairs.  Make sure that there are handrails on both sides of the stairs. Fix handrails that are broken or loose.  Install nonslip stair treads on all your stairs.  Avoid having throw rugs at the  top or bottom of the stairs.  Choose a carpet that does not hide the edge of the steps on the stairs.  Check carpeting to make sure that it is firmly attached to the stairs. Fix carpet that is loose or worn. What can I do on the outside of my home?  Use bright outdoor lighting.  Fix the edges of walkways and driveways and fix any cracks.  Remove anything that might  make you trip as you walk through a door, such as a raised step or threshold.  Trim any bushes or trees on paths to your home.  Check to see if handrails are loose or broken and that both sides of all steps have handrails.  Install guardrails along the edges of any raised decks and porches.  Clear paths of anything that can make you trip, such as tools or rocks.  Have leaves, snow, or ice cleared regularly.  Use sand or salt on paths during winter.  Clean up any spills in your garage right away. This includes grease or oil spills. What other actions can I take?  Wear shoes that: ? Have a low heel. Do not wear high heels. ? Have rubber bottoms. ? Feel good on your feet and fit well. ? Are closed at the toe. Do not wear open-toe sandals.  Use tools that help you move around if needed. These include: ? Canes. ? Walkers. ? Scooters. ? Crutches.  Review your medicines with your doctor. Some medicines can make you feel dizzy. This can increase your chance of falling. Ask your doctor what else you can do to help prevent falls. Where to find more information  Centers for Disease Control and Prevention, [place]: http://www.wolf.info/  National Institute on Aging: http://kim-miller.com/ Contact a doctor if:  You are afraid of falling at home.  You feel weak, drowsy, or dizzy at home.  You fall at home. Summary  There are many simple things that you can do to make your home safe and to help prevent falls.  Ways to make your home safe include removing things that can make you trip and installing grab bars in the bathroom.  Ask for help when making these changes in your home. This information is not intended to replace advice given to you by your health care provider. Make sure you discuss any questions you have with your health care provider. Document Revised: 03/10/2020 Document Reviewed: 03/10/2020 Elsevier Patient Education  [place].   Steps to Quit Smoking Smoking tobacco is  the leading cause of preventable death. It can affect almost every organ in the body. Smoking puts you and those around you at risk for developing many serious chronic diseases. Quitting smoking can be difficult, but it is one of the best things that you can do for your health. It is never too late to quit. How do I get ready to quit? When you decide to quit smoking, create a plan to help you succeed. Before you quit:  Pick a date to quit. Set a date within the next 2 weeks to give you time to prepare.  Write down the reasons why you are quitting. Keep this list in places where you will see it often.  Tell your family, friends, and co-workers that you are quitting. Support from your loved ones can make quitting easier.  Talk with your health care provider about your options for quitting smoking.  Find out what treatment options are covered by your health insurance.  Identify people, places,  things, and activities that make you want to smoke (triggers). Avoid them. What first steps can I take to quit smoking?  Throw away all cigarettes at home, at work, and in your car.  Throw away smoking accessories, such as Scientist, research (medical).  Clean your car. Make sure to empty the ashtray.  Clean your home, including curtains and carpets. What strategies can I use to quit smoking? Talk with your health care provider about combining strategies, such as taking medicines while you are also receiving in-person counseling. Using these two strategies together makes you more likely to succeed in quitting than if you used either strategy on its own.  If you are pregnant or breastfeeding, talk with your health care provider about finding counseling or other support strategies to quit smoking. Do not take medicine to help you quit smoking unless your health care provider tells you to do so. To quit smoking: Quit right away  Quit smoking completely, instead of gradually reducing how much you smoke over a  period of time. Research shows that stopping smoking right away is more successful than gradually quitting.  Attend in-person counseling to help you build problem-solving skills. You are more likely to succeed in quitting if you attend counseling sessions regularly. Even short sessions of 10 minutes can be effective. Take medicine You may take medicines to help you quit smoking. Some medicines require a prescription and some you can purchase over-the-counter. Medicines may have nicotine in them to replace the nicotine in cigarettes. Medicines may:  Help to stop cravings.  Help to relieve withdrawal symptoms. Your health care provider may recommend:  Nicotine patches, gum, or lozenges.  Nicotine inhalers or sprays.  Non-nicotine medicine that is taken by mouth. Find resources Find resources and support systems that can help you to quit smoking and remain smoke-free after you quit. These resources are most helpful when you use them often. They include:  Online chats with a Social worker.  Telephone quitlines.  Printed Furniture conservator/restorer.  Support groups or group counseling.  Text messaging programs.  Mobile phone apps or applications. Use apps that can help you stick to your quit plan by providing reminders, tips, and encouragement. There are many free apps for mobile devices as well as websites. Examples include Quit Guide from the [employer] and smokefree.gov   What things can I do to make it easier to quit?  Reach out to your family and friends for support and encouragement. Call telephone quitlines (1-800-QUIT-NOW), reach out to support groups, or work with a counselor for support.  Ask people who smoke to avoid smoking around you.  Avoid places that trigger you to smoke, such as bars, parties, or smoke-break areas at work.  Spend time with people who do not smoke.  Lessen the stress in your life. Stress can be a smoking trigger for some people. To lessen stress, try: ? Exercising  regularly. ? Doing deep-breathing exercises. ? Doing yoga. ? Meditating. ? Performing a body scan. This involves closing your eyes, scanning your body from head to toe, and noticing which parts of your body are particularly tense. Try to relax the muscles in those areas.   How will I feel when I quit smoking? Day 1 to 3 weeks Within the first 24 hours of quitting smoking, you may start to feel withdrawal symptoms. These symptoms are usually most noticeable 2-3 days after quitting, but they usually do not last for more than 2-3 weeks. You may experience these symptoms:  Mood swings.  Restlessness, anxiety, or irritability.  Trouble concentrating.  Dizziness.  Strong cravings for sugary foods and nicotine.  Mild weight gain.  Constipation.  Nausea.  Coughing or a sore throat.  Changes in how the medicines that you take for unrelated issues work in your body.  Depression.  Trouble sleeping (insomnia). Week 3 and afterward After the first 2-3 weeks of quitting, you may start to notice more positive results, such as:  Improved sense of smell and taste.  Decreased coughing and sore throat.  Slower heart rate.  Lower blood pressure.  Clearer skin.  The ability to breathe more easily.  Fewer sick days. Quitting smoking can be very challenging. Do not get discouraged if you are not successful the first time. Some people need to make many attempts to quit before they achieve long-term success. Do your best to stick to your quit plan, and talk with your health care provider if you have any questions or concerns. Summary  Smoking tobacco is the leading cause of preventable death. Quitting smoking is one of the best things that you can do for your health.  When you decide to quit smoking, create a plan to help you succeed.  Quit smoking right away, not slowly over a period of time.  When you start quitting, seek help from your health care provider, family, or  friends. This information is not intended to replace advice given to you by your health care provider. Make sure you discuss any questions you have with your health care provider. Document Revised: 05/02/2019 Document Reviewed: 10/26/2018 Elsevier Patient Education  [place].   Steps to Quit Smoking Smoking tobacco is the leading cause of preventable death. It can affect almost every organ in the body. Smoking puts you and people around you at risk for many serious, long-lasting (chronic) diseases. Quitting smoking can be hard, but it is one of the best things that you can do for your health. It is never too late to quit. How do I get ready to quit? When you decide to quit smoking, make a plan to help you succeed. Before you quit:  Pick a date to quit. Set a date within the next 2 weeks to give you time to prepare.  Write down the reasons why you are quitting. Keep this list in places where you will see it often.  Tell your family, friends, and co-workers that you are quitting. Their support is important.  Talk with your doctor about the choices that may help you quit.  Find out if your health insurance will pay for these treatments.  Know the people, places, things, and activities that make you want to smoke (triggers). Avoid them. What first steps can I take to quit smoking?  Throw away all cigarettes at home, at work, and in your car.  Throw away the things that you use when you smoke, such as ashtrays and lighters.  Clean your car. Make sure to empty the ashtray.  Clean your home, including curtains and carpets. What can I do to help me quit smoking? Talk with your doctor about taking medicines and seeing a counselor at the same time. You are more likely to succeed when you do both.  If you are pregnant or breastfeeding, talk with your doctor about counseling or other ways to quit smoking. Do not take medicine to help you quit smoking unless your doctor tells you to do  so. To quit smoking: Quit right away  Quit smoking totally, instead of slowly  cutting back on how much you smoke over a period of time.  Go to counseling. You are more likely to quit if you go to counseling sessions regularly. Take medicine You may take medicines to help you quit. Some medicines need a prescription, and some you can buy over-the-counter. Some medicines may contain a drug called nicotine to replace the nicotine in cigarettes. Medicines may:  Help you to stop having the desire to smoke (cravings).  Help to stop the problems that come when you stop smoking (withdrawal symptoms). Your doctor may ask you to use:  Nicotine patches, gum, or lozenges.  Nicotine inhalers or sprays.  Non-nicotine medicine that is taken by mouth. Find resources Find resources and other ways to help you quit smoking and remain smoke-free after you quit. These resources are most helpful when you use them often. They include:  Online chats with a Social worker.  Phone quitlines.  Printed Furniture conservator/restorer.  Support groups or group counseling.  Text messaging programs.  Mobile phone apps. Use apps on your mobile phone or tablet that can help you stick to your quit plan. There are many free apps for mobile phones and tablets as well as websites. Examples include Quit Guide from the [employer] and smokefree.gov   What things can I do to make it easier to quit?  Talk to your family and friends. Ask them to support and encourage you.  Call a phone quitline (1-800-QUIT-NOW), reach out to support groups, or work with a Social worker.  Ask people who smoke to not smoke around you.  Avoid places that make you want to smoke, such as: ? Bars. ? Parties. ? Smoke-break areas at work.  Spend time with people who do not smoke.  Lower the stress in your life. Stress can make you want to smoke. Try these things to help your stress: ? Getting regular exercise. ? Doing deep-breathing exercises. ? Doing  yoga. ? Meditating. ? Doing a body scan. To do this, close your eyes, focus on one area of your body at a time from head to toe. Notice which parts of your body are tense. Try to relax the muscles in those areas.   How will I feel when I quit smoking? Day 1 to 3 weeks Within the first 24 hours, you may start to have some problems that come from quitting tobacco. These problems are very bad 2-3 days after you quit, but they do not often last for more than 2-3 weeks. You may get these symptoms:  Mood swings.  Feeling restless, nervous, angry, or annoyed.  Trouble concentrating.  Dizziness.  Strong desire for high-sugar foods and nicotine.  Weight gain.  Trouble pooping (constipation).  Feeling like you may vomit (nausea).  Coughing or a sore throat.  Changes in how the medicines that you take for other issues work in your body.  Depression.  Trouble sleeping (insomnia). Week 3 and afterward After the first 2-3 weeks of quitting, you may start to notice more positive results, such as:  Better sense of smell and taste.  Less coughing and sore throat.  Slower heart rate.  Lower blood pressure.  Clearer skin.  Better breathing.  Fewer sick days. Quitting smoking can be hard. Do not give up if you fail the first time. Some people need to try a few times before they succeed. Do your best to stick to your quit plan, and talk with your doctor if you have any questions or concerns. Summary  Smoking tobacco is the  leading cause of preventable death. Quitting smoking can be hard, but it is one of the best things that you can do for your health.  When you decide to quit smoking, make a plan to help you succeed.  Quit smoking right away, not slowly over a period of time.  When you start quitting, seek help from your doctor, family, or friends. This information is not intended to replace advice given to you by your health care provider. Make sure you discuss any questions you  have with your health care provider. Document Revised: 05/02/2019 Document Reviewed: 10/26/2018 Elsevier Patient Education  [place].

## 2021-01-15 ENCOUNTER — Other Ambulatory Visit: Payer: Self-pay | Admitting: Nurse Practitioner

## 2021-01-16 ENCOUNTER — Other Ambulatory Visit: Payer: Self-pay | Admitting: Nurse Practitioner

## 2021-01-17 ENCOUNTER — Emergency Department (HOSPITAL_COMMUNITY): Payer: Medicare Other

## 2021-01-17 ENCOUNTER — Emergency Department (HOSPITAL_COMMUNITY)
Admission: EM | Admit: 2021-01-17 | Discharge: 2021-01-18 | Disposition: A | Discharge status: Home / Self Care | Payer: Medicare Other | Attending: Emergency Medicine | Admitting: Emergency Medicine

## 2021-01-17 DIAGNOSIS — R404 Transient alteration of awareness: Secondary | ICD-10-CM | POA: Diagnosis not present

## 2021-01-17 DIAGNOSIS — R4182 Altered mental status, unspecified: Secondary | ICD-10-CM | POA: Insufficient documentation

## 2021-01-17 DIAGNOSIS — Z7984 Long term (current) use of oral hypoglycemic drugs: Secondary | ICD-10-CM | POA: Insufficient documentation

## 2021-01-17 DIAGNOSIS — Z79899 Other long term (current) drug therapy: Secondary | ICD-10-CM | POA: Insufficient documentation

## 2021-01-17 DIAGNOSIS — E785 Hyperlipidemia, unspecified: Secondary | ICD-10-CM | POA: Diagnosis not present

## 2021-01-17 DIAGNOSIS — E1169 Type 2 diabetes mellitus with other specified complication: Secondary | ICD-10-CM | POA: Insufficient documentation

## 2021-01-17 DIAGNOSIS — J45909 Unspecified asthma, uncomplicated: Secondary | ICD-10-CM | POA: Diagnosis not present

## 2021-01-17 DIAGNOSIS — E1139 Type 2 diabetes mellitus with other diabetic ophthalmic complication: Secondary | ICD-10-CM | POA: Insufficient documentation

## 2021-01-17 DIAGNOSIS — R0789 Other chest pain: Secondary | ICD-10-CM | POA: Diagnosis not present

## 2021-01-17 DIAGNOSIS — R600 Localized edema: Secondary | ICD-10-CM | POA: Diagnosis not present

## 2021-01-17 DIAGNOSIS — R0902 Hypoxemia: Secondary | ICD-10-CM | POA: Diagnosis not present

## 2021-01-17 DIAGNOSIS — I10 Essential (primary) hypertension: Secondary | ICD-10-CM | POA: Insufficient documentation

## 2021-01-17 DIAGNOSIS — R0689 Other abnormalities of breathing: Secondary | ICD-10-CM | POA: Diagnosis not present

## 2021-01-17 DIAGNOSIS — F17210 Nicotine dependence, cigarettes, uncomplicated: Secondary | ICD-10-CM | POA: Insufficient documentation

## 2021-01-17 DIAGNOSIS — R5383 Other fatigue: Secondary | ICD-10-CM | POA: Insufficient documentation

## 2021-01-17 DIAGNOSIS — R079 Chest pain, unspecified: Secondary | ICD-10-CM | POA: Diagnosis not present

## 2021-01-17 DIAGNOSIS — R21 Rash and other nonspecific skin eruption: Secondary | ICD-10-CM | POA: Diagnosis not present

## 2021-01-17 DIAGNOSIS — I517 Cardiomegaly: Secondary | ICD-10-CM | POA: Diagnosis not present

## 2021-01-17 LAB — COMPREHENSIVE METABOLIC PANEL
ALT: 23 U/L (ref 0–44)
AST: 22 U/L (ref 15–41)
Albumin: 3.6 g/dL (ref 3.5–5.0)
Alkaline Phosphatase: 86 U/L (ref 38–126)
Anion gap: 8 (ref 5–15)
BUN: 12 mg/dL (ref 6–20)
CO2: 26 mmol/L (ref 22–32)
Calcium: 9.1 mg/dL (ref 8.9–10.3)
Chloride: 106 mmol/L (ref 98–111)
Creatinine, Ser: 0.88 mg/dL (ref 0.44–1.00)
GFR, Estimated: >60 mL/min (ref >60)
Glucose, Bld: 148 mg/dL — ABNORMAL HIGH (ref 70–99)
Potassium: 3.2 mmol/L — ABNORMAL LOW (ref 3.5–5.1)
Sodium: 140 mmol/L (ref 135–145)
Total Bilirubin: 0.3 mg/dL (ref 0.3–1.2)
Total Protein: 6.4 g/dL — ABNORMAL LOW (ref 6.5–8.1)

## 2021-01-17 LAB — CBG MONITORING, ED: Glucose-Capillary: 148 mg/dL — ABNORMAL HIGH (ref 70–99)

## 2021-01-17 LAB — I-STAT BETA HCG BLOOD, ED (MC, WL, AP ONLY): I-stat hCG, quantitative: <5 m[IU]/mL (ref <5)

## 2021-01-17 LAB — CBC WITH DIFFERENTIAL/PLATELET
Abs Immature Granulocytes: 0.03 10*3/uL (ref 0.00–0.07)
Basophils Absolute: 0.1 10*3/uL (ref 0.0–0.1)
Basophils Relative: 1 %
Eosinophils Absolute: 0.4 10*3/uL (ref 0.0–0.5)
Eosinophils Relative: 5 %
HCT: 39.3 % (ref 36.0–46.0)
Hemoglobin: 12.5 g/dL (ref 12.0–15.0)
Immature Granulocytes: 0 %
Lymphocytes Relative: 33 %
Lymphs Abs: 2.5 10*3/uL (ref 0.7–4.0)
MCH: 30 pg (ref 26.0–34.0)
MCHC: 31.8 g/dL (ref 30.0–36.0)
MCV: 94.2 fL (ref 80.0–100.0)
Monocytes Absolute: 0.5 10*3/uL (ref 0.1–1.0)
Monocytes Relative: 6 %
Neutro Abs: 4.1 10*3/uL (ref 1.7–7.7)
Neutrophils Relative %: 55 %
Platelets: 186 10*3/uL (ref 150–400)
RBC: 4.17 MIL/uL (ref 3.87–5.11)
RDW: 11.9 % (ref 11.5–15.5)
WBC: 7.5 10*3/uL (ref 4.0–10.5)
nRBC: 0 % (ref 0.0–0.2)

## 2021-01-17 LAB — I-STAT VENOUS BLOOD GAS, ED
Acid-Base Excess: 2 mmol/L (ref 0.0–2.0)
Bicarbonate: 28 mmol/L (ref 20.0–28.0)
Calcium, Ion: 1.19 mmol/L (ref 1.15–1.40)
HCT: 37 % (ref 36.0–46.0)
Hemoglobin: 12.6 g/dL (ref 12.0–15.0)
O2 Saturation: 90 %
Potassium: 3.2 mmol/L — ABNORMAL LOW (ref 3.5–5.1)
Sodium: 143 mmol/L (ref 135–145)
TCO2: 29 mmol/L (ref 22–32)
pCO2, Ven: 47.8 mmHg (ref 44.0–60.0)
pH, Ven: 7.376 (ref 7.250–7.430)
pO2, Ven: 60 mmHg — ABNORMAL HIGH (ref 32.0–45.0)

## 2021-01-17 LAB — LACTIC ACID, PLASMA: Lactic Acid, Venous: 1.7 mmol/L (ref 0.5–1.9)

## 2021-01-17 NOTE — ED Provider Notes (Signed)
[facility] EMERGENCY DEPARTMENT Provider Note   CSN: [id number] Arrival date & time: 01/17/21  2116     History Chief Complaint  Patient presents with  . Chest Pain    Leah Barber is a 45 y.o. female.  The history is provided by the patient, the EMS personnel and medical records.  Altered Mental Status Presenting symptoms: lethargy, partial responsiveness and unresponsiveness   Severity:  Severe Most recent episode:  Today Episode history:  Single Duration: Unknown. Timing:  Constant Progression:  Improving Chronicity:  New Context: drug use   Context: not alcohol use, not dementia, not head injury, not homeless, not nursing home resident, not recent change in medication, not recent illness and not recent infection   Associated symptoms: no abdominal pain, normal movement, no agitation, no bladder incontinence, no decreased appetite, no depression, no difficulty breathing, no eye deviation, no fever, no hallucinations, no headaches, no light-headedness, no nausea, no palpitations, no rash, no seizures, no slurred speech, no suicidal behavior, no visual change, no vomiting and no weakness    Patient was found unresponsive at home. Was having very shallow breathing. EMS gave IN narcan with some improvement. There was reported drug paraphernalia at the scene.  History limited 2/2 patient mental status. Is able to tell me she is having no pain. Denies attempts or thoughts of hurting herself or others. Denies ingestion other than marijuana.      Past Medical History:  Diagnosis Date  . Allergy   . Anemia   . Anxiety   . Arthritis   . Asthma   . Chronic lower back pain   . Depression   . Diabetes mellitus without complication ([place])   . Dyspnea   . GERD (gastroesophageal reflux disease)   . Glaucoma   . Hyperlipemia   . Hypertension   . IIH (idiopathic intracranial hypertension)   . Pseudotumor cerebri 08/13/2019  . Substance abuse ([place])    Rx  drugs for pain medication. Has not had in 5 years.    Patient Active Problem List   Diagnosis Date Noted  . Abnormal uterine bleeding (AUB) 11/25/2019  . Post-operative state 11/25/2019  . Excessive daytime sleepiness 09/18/2019  . Hypersomnia with sleep apnea 09/18/2019  . Chronic pain syndrome 09/09/2019  . Pseudotumor cerebri 08/13/2019  . Nonintractable headache 07/07/2019  . History of idiopathic intracranial hypertension 07/07/2019  . Intertrigo 07/07/2019  . Environmental allergies 07/07/2019  . Fibroid 07/07/2019  . Polysubstance dependence including opioid type drug with complication, episodic abuse, with unspecified complication ([place]) 46/27/0350  . Depression 09/13/2015  . Cluster B personality disorder ([place]) 05/15/2014  . Dependence on nicotine from cigarettes 05/15/2014  . Substance induced mood disorder ([place]) 05/15/2014  . Benzodiazepine abuse ([place]) 09/20/2011  . Cannabis abuse 05/04/2011  . Cocaine abuse ([place]) 05/04/2011  . Polysubstance dependence ([place]) 05/04/2011  . Candidiasis of vagina 04/30/2011  . Chronic pain in right shoulder 04/30/2011  . Depressive disorder, not elsewhere classified 04/30/2011  . Otitis media, acute 04/30/2011    Past Surgical History:  Procedure Laterality Date  . CESAREAN SECTION     x 3  . CYSTOSCOPY Bilateral 11/25/2019   Procedure: CYSTOSCOPY;  Surgeon: Lavonia Drafts, MD;  Location: [facility];  Service: Gynecology;  Laterality: Bilateral;  . DERMOID CYST  EXCISION    . fluid removed from brain  12/20/2018  . ROBOTIC ASSISTED TOTAL HYSTERECTOMY Bilateral 11/25/2019   Procedure: XI ROBOTIC ASSISTED TOTAL HYSTERECTOMY WITH SALPINGECTOMY;  Surgeon: Ihor Dow,  Hoyle Sauer, MD;  Location: [facility];  Service: Gynecology;  Laterality: Bilateral;     OB History    Gravida  4   Para  3   Term  3   Preterm      AB  1   Living  3     SAB  1   IAB      Ectopic      Multiple       Live Births  3           Family History  Problem Relation Age of Onset  . Diabetes Mother   . Hypertension Mother   . Cancer Father   . Liver cancer Father   . Colon cancer Neg Hx   . Esophageal cancer Neg Hx   . Rectal cancer Neg Hx   . Stomach cancer Neg Hx   . Pancreatic cancer Neg Hx     Social History   Tobacco Use  . Smoking status: Current Every Day Smoker    Packs/day: 1.00    Types: Cigarettes  . Smokeless tobacco: Never Used  Vaping Use  . Vaping Use: Never used  Substance Use Topics  . Alcohol use: Yes    Comment: occasional  . Drug use: No    Home Medications Prior to Admission medications   Medication Sig Start Date End Date Taking? Authorizing Provider  Albuterol Sulfate (PROAIR RESPICLICK) 416 (90 Base) MCG/ACT AEPB Inhale 2 puffs into the lungs 4 (four) times daily as needed. 05/17/20 05/17/21  [name], NP  amLODipine (NORVASC) 10 MG tablet Take 1 tablet (10 mg total) by mouth daily. 05/17/20 05/17/21  [name], NP  buprenorphine (SUBUTEX) 8 MG SUBL SL tablet Place 8 mg under the tongue every 8 (eight) hours. 03/31/20   [provider]  celecoxib (CELEBREX) 200 MG capsule TAKE 1 CAPSULE BY MOUTH TWICE A DAY 12/15/20   [name], NP  cetirizine (ZYRTEC) 10 MG tablet Take 1 tablet (10 mg total) by mouth daily. 05/17/20 05/17/21  [name], NP  cloNIDine (CATAPRES) 0.1 MG tablet Take 1 tablet (0.1 mg total) by mouth 3 (three) times daily. 05/17/20 05/17/21  [name], NP  CONCERTA 18 MG CR tablet Take 18 mg by mouth at bedtime. 05/17/20   [provider]  CONCERTA 36 MG CR tablet Take 36 mg by mouth daily as needed (ADHD).  10/13/19   [provider]  DULoxetine (CYMBALTA) 60 MG capsule Take 1 capsule (60 mg total) by mouth 2 (two) times daily. 05/17/20   [name], NP  FLUoxetine (PROZAC) 20 MG capsule TAKE 3 CAPSULES EVERY MORNING 12/22/20   [name], NP  FLUoxetine (PROZAC) 40 MG capsule Take 1  capsule (40 mg total) by mouth every morning. 05/17/20 08/15/20  [name], NP  fluticasone [facility]) 50 MCG/ACT nasal spray USE 2 SPRAYS IN [facility] NOSTRIL DAILY 11/03/20   [name], NP  furosemide (LASIX) 40 MG tablet Take 1 tablet (40 mg total) by mouth daily. 11/11/20   [name], PA-C  gabapentin (NEURONTIN) 400 MG capsule Take 2 capsules (800 mg total) by mouth 3 (three) times daily. 05/17/20 05/17/21  [name], NP  hydrOXYzine (VISTARIL) 50 MG capsule Take 50 mg by mouth 4 (four) times daily as needed for anxiety.  11/26/19   [provider]  ibuprofen (ADVIL) 800 MG tablet TAKE 1 TABLET BY MOUTH EVERY 8 HOURS AS NEEDED 10/19/20  Azzie Glatter, FNP  latanoprost (XALATAN) 0.005 % ophthalmic solution Place 1 drop into both eyes at bedtime. 05/17/20   [name], NP  metFORMIN (GLUCOPHAGE) 500 MG tablet Take 1 tablet (500 mg total) by mouth 2 (two) times daily with a meal. 05/17/20 05/17/21  [name], NP  Multiple Vitamins-Minerals (WOMENS MULTIVITAMIN PO) Take 1 tablet by mouth daily.     [provider]  omeprazole (PRILOSEC) 40 MG capsule Take 1 capsule (40 mg total) by mouth daily. 05/17/20 05/17/21  [name], NP  ondansetron (ZOFRAN) 4 MG tablet TAKE 1 TABLET (4 MG TOTAL) BY MOUTH EVERY 6 (SIX) HOURS AS NEEDED FOR NAUSEA OR VOMITING. 12/08/20   [name], NP  rosuvastatin (CRESTOR) 5 MG tablet Take 1 tablet (5 mg total) by mouth daily. 05/20/20 05/20/21  [name], NP  spironolactone (ALDACTONE) 25 MG tablet Take 1 tablet (25 mg total) by mouth daily for 7 days. 08/05/20 08/12/20  [name], NP  zolpidem (AMBIEN) 5 MG tablet Take 1 tablet (5 mg total) by mouth at bedtime as needed for up to 5 days for sleep. 03/23/20 03/28/20  Dorena Dew, FNP    Allergies    Clindamycin/lincomycin, Other, Hydrocodone, and Morphine  Review of Systems   Review of Systems  Constitutional: Negative for chills, decreased appetite and fever.   HENT: Negative for ear pain and sore throat.   Eyes: Negative for pain and visual disturbance.  Respiratory: Negative for cough and shortness of breath.   Cardiovascular: Negative for chest pain and palpitations.  Gastrointestinal: Negative for abdominal pain, nausea and vomiting.  Genitourinary: Negative for bladder incontinence, dysuria and hematuria.  Musculoskeletal: Negative for arthralgias and back pain.  Skin: Negative for color change and rash.  Neurological: Negative for seizures, syncope, weakness, light-headedness and headaches.  Psychiatric/Behavioral: Negative for agitation and hallucinations.  All other systems reviewed and are negative.   Physical Exam Updated Vital Signs BP 118/84   Pulse 82   Temp 97.9 F (36.6 C) (Temporal)   Resp 15   Ht 5\' 2"  (1.575 m)   Wt 97.5 kg   LMP 09/14/2019 (Approximate)   SpO2 92%   BMI 39.31 kg/m   Physical Exam Vitals and nursing note reviewed.  Constitutional:      General: She is not in acute distress.    Appearance: She is well-developed and overweight. She is ill-appearing. She is not toxic-appearing.     Interventions: Nasal cannula in place.  HENT:     Head: Normocephalic and atraumatic.  Eyes:     General: No visual field deficit.    Conjunctiva/sclera: Conjunctivae normal.  Cardiovascular:     Rate and Rhythm: Normal rate and regular rhythm.     Pulses: Normal pulses.          Radial pulses are 2+ on the right side and 2+ on the left side.       Dorsalis pedis pulses are 2+ on the right side and 2+ on the left side.     Heart sounds: No murmur heard.   Pulmonary:     Effort: Pulmonary effort is normal. No respiratory distress.     Breath sounds: Normal breath sounds. No decreased air movement. No decreased breath sounds.  Abdominal:     General: Abdomen is flat.     Palpations: Abdomen is soft.     Tenderness: There is no abdominal tenderness.  Musculoskeletal:     Cervical back: Neck  supple.     Right  lower leg: 1+ Pitting Edema present.     Left lower leg: 1+ Pitting Edema present.  Skin:    General: Skin is warm and dry.  Neurological:     General: No focal deficit present.     Mental Status: She is lethargic.     GCS: GCS eye subscore is 3. GCS verbal subscore is 3. GCS motor subscore is 6.     Cranial Nerves: Cranial nerves are intact. No cranial nerve deficit, dysarthria or facial asymmetry.     Sensory: Sensation is intact. No sensory deficit.     Motor: Motor function is intact. No weakness.     ED Results / Procedures / Treatments   Labs (all labs ordered are listed, but only abnormal results are displayed) Labs Reviewed  COMPREHENSIVE METABOLIC PANEL - Abnormal; Notable for the following components:      Result Value   Potassium 3.2 (*)    Glucose, Bld 148 (*)    Total Protein 6.4 (*)    All other components within normal limits  CBG MONITORING, ED - Abnormal; Notable for the following components:   Glucose-Capillary 148 (*)    All other components within normal limits  I-STAT VENOUS BLOOD GAS, ED - Abnormal; Notable for the following components:   pO2, Ven 60.0 (*)    Potassium 3.2 (*)    All other components within normal limits  CBC WITH DIFFERENTIAL/PLATELET  LACTIC ACID, PLASMA  URINALYSIS, COMPLETE (UACMP) WITH MICROSCOPIC  RAPID URINE DRUG SCREEN, HOSP PERFORMED  BRAIN NATRIURETIC PEPTIDE  I-STAT BETA HCG BLOOD, ED ([facility], [facility], [facility] ONLY)  TROPONIN I (HIGH SENSITIVITY)    EKG EKG Interpretation  Date/Time:  Monday Jan 17 2021 21:22:00 EDT Ventricular Rate:  86 PR Interval:  167 QRS Duration: 89 QT Interval:  395 QTC Calculation: 473 R Axis:   -15 Text Interpretation: Sinus rhythm Borderline left axis deviation Low voltage, precordial leads Abnormal R-wave progression, early transition When compared to prior, similar appearance. NO STEMI Confirmed by Antony Blackbird [phone]) on 01/17/2021 9:33:02 PM   Radiology CT HEAD WO CONTRAST  Result Date:  01/17/2021 CLINICAL DATA:  Altered mental status EXAM: CT HEAD WITHOUT CONTRAST TECHNIQUE: Contiguous axial images were obtained from the base of the skull through the vertex without intravenous contrast. COMPARISON:  10/26/2019 FINDINGS: Brain: No evidence of acute infarction, hemorrhage, hydrocephalus, extra-axial collection or mass lesion/mass effect. Vascular: No hyperdense vessel or unexpected calcification. Skull: Normal. Negative for fracture or focal lesion. Sinuses/Orbits: No acute finding. Other: None. IMPRESSION: No acute intracranial abnormality noted. Electronically Signed   By: Inez Catalina M.D.   On: 01/17/2021 22:27   DG Chest Port 1 View  Result Date: 01/17/2021 CLINICAL DATA:  21 female with altered mental status. EXAM: PORTABLE CHEST 1 VIEW COMPARISON:  Chest radiograph dated 07/28/2020 and 11/11/2020. FINDINGS: There is mild cardiomegaly and mild vascular congestion. No focal consolidation, pleural effusion, or pneumothorax. No acute osseous pathology. IMPRESSION: Mild cardiomegaly with mild vascular congestion. No focal consolidation. Electronically Signed   By: Anner Crete M.D.   On: 01/17/2021 21:55    Procedures Procedures   Medications Ordered in ED Medications - No data to display  ED Course  I have reviewed the triage vital signs and the nursing notes.  Pertinent labs & imaging results that were available during my care of the patient were reviewed by me and considered in my medical decision making (see chart for details).  MDM Rules/Calculators/A&P                          This is a 45yo F with a history of non insuline dependent DM, HTN, polysubstance abuse, current buprenorphine use and tobacco abuse who presented to the ED with altered mental status improving after getting IN narcan from EMS prior to arrival. In the ED she was HDS with a nonfocal neurologic exam though encephalopathic. No evidence of trauma on exam. I did consider traumatic ICH  as well as spontaneous ICH however mental status improves significantly with strong verbal encouragement. Nonfocal exam. Glucose wnl. Has lower extremity edema and a prescription for Lasix though no documented h/o CHF. CXR showing mild cardiomegaly and some mild vascular congestion. Do not suspect this is contributing to her presentation today however.  No significant metabolic derangements. Not acidotic. Blood gas reassuring.  Suspect intoxication, potentially accidental overdose. Breathing adequately and protecting her airway. Will awake and follow verbal commands so no indication for re-dosing narcan.  ED course: CTH without evidence of ICH or traumatic injury.  Exam improving. She will awake much easier to voice. Follow commands more closely. Still not telling me what happened but denies any attempt to harm herself.   Final Clinical Impression(s) / ED Diagnoses Final diagnoses:  None    Rx / DC Orders ED Discharge Orders    None       Pearson Grippe, DO 01/18/21 0023    Tegeler, Gwenyth Allegra, MD 01/20/21 1120

## 2021-01-17 NOTE — ED Triage Notes (Signed)
Patient to ED via EMS for complaints of CP. States that it has been going on x2 weeks. Also has lower leg swelling. Per EMS patient has drowsy and hard to arouse in transit, 0.5 intranasal narcan administered and patient became more alert. Patient denies use of drugs/etoh.

## 2021-01-18 DIAGNOSIS — R4182 Altered mental status, unspecified: Secondary | ICD-10-CM | POA: Diagnosis not present

## 2021-01-18 LAB — ETHANOL: Alcohol, Ethyl (B): <10 mg/dL (ref <10)

## 2021-01-18 LAB — BRAIN NATRIURETIC PEPTIDE: B Natriuretic Peptide: 9.6 pg/mL (ref 0.0–100.0)

## 2021-01-18 LAB — TROPONIN I (HIGH SENSITIVITY)
Troponin I (High Sensitivity): 34 ng/L — ABNORMAL HIGH (ref <18)
Troponin I (High Sensitivity): 5 ng/L (ref <18)

## 2021-01-18 MED ORDER — SODIUM CHLORIDE 0.9 % IV BOLUS
1000.0000 mL | Freq: Once | INTRAVENOUS | Status: AC
  Administered 2021-01-18: 1000 mL via INTRAVENOUS

## 2021-01-18 NOTE — ED Notes (Signed)
Pt asleep but is easy to wake up. Denies pain at this time

## 2021-01-18 NOTE — ED Provider Notes (Signed)
Care assumed from Dr. Sherry Ruffing at shift change.  Patient was brought by EMS after being found somnolent and difficult to arouse by family members.  Patient received Narcan by EMS with little effect.  She remained somnolent at shift change.  Patient has been observed overnight.  She has been resting comfortably.  I have checked on her on several occasions, each time she appears to be more alert.  She is now able to carry on a conversation and has no complaints.  Patient denies to me that she overdosed and denies any suicidal ideation.  Patient has apparently had similar episodes in the past that have been unexplained.  I suspect there is a degree of polypharmacy as patient takes Suboxone, Neurontin, and other medications with sedative effects.  At this point, discharge seems appropriate with primary care follow-up.   Veryl Speak, MD 01/18/21 [phone]

## 2021-01-18 NOTE — Discharge Instructions (Addendum)
Continue medications as previously prescribed.  Follow-up with your primary doctor in the next week and return to the ER if symptoms significantly worsen or change.

## 2021-01-19 ENCOUNTER — Other Ambulatory Visit: Payer: Self-pay | Admitting: Nurse Practitioner

## 2021-01-19 MED ORDER — IBUPROFEN 800 MG PO TABS: 800.0000 mg | ORAL_TABLET | Freq: Three times a day (TID) | ORAL | 5 refills | Status: DC | PRN

## 2021-01-26 ENCOUNTER — Other Ambulatory Visit: Payer: Self-pay

## 2021-01-26 ENCOUNTER — Telehealth (INDEPENDENT_AMBULATORY_CARE_PROVIDER_SITE_OTHER): Payer: Medicare Other | Admitting: Nurse Practitioner

## 2021-01-26 DIAGNOSIS — Z09 Encounter for follow-up examination after completed treatment for conditions other than malignant neoplasm: Secondary | ICD-10-CM

## 2021-01-26 NOTE — Progress Notes (Signed)
   [facility] [address] [place], [place]  [address] Phone:  [phone]   Fax:  [phone] 

## 2021-01-31 ENCOUNTER — Other Ambulatory Visit: Payer: Self-pay | Admitting: Nurse Practitioner

## 2021-01-31 DIAGNOSIS — R110 Nausea: Secondary | ICD-10-CM

## 2021-02-03 ENCOUNTER — Other Ambulatory Visit: Payer: Self-pay

## 2021-02-03 ENCOUNTER — Ambulatory Visit (INDEPENDENT_AMBULATORY_CARE_PROVIDER_SITE_OTHER): Payer: Medicare HMO | Admitting: Nurse Practitioner

## 2021-02-03 VITALS — BP 140/88 | HR 82 | Temp 97.0°F | Ht 64.0 in | Wt 210.0 lb

## 2021-02-03 DIAGNOSIS — R609 Edema, unspecified: Secondary | ICD-10-CM | POA: Diagnosis not present

## 2021-02-03 DIAGNOSIS — L97819 Non-pressure chronic ulcer of other part of right lower leg with unspecified severity: Secondary | ICD-10-CM | POA: Diagnosis not present

## 2021-02-03 DIAGNOSIS — N898 Other specified noninflammatory disorders of vagina: Secondary | ICD-10-CM

## 2021-02-03 DIAGNOSIS — E119 Type 2 diabetes mellitus without complications: Secondary | ICD-10-CM | POA: Diagnosis not present

## 2021-02-03 DIAGNOSIS — I10 Essential (primary) hypertension: Secondary | ICD-10-CM

## 2021-02-03 MED ORDER — METRONIDAZOLE 500 MG PO TABS: 500.0000 mg | ORAL_TABLET | Freq: Three times a day (TID) | ORAL | 0 refills | Status: AC

## 2021-02-03 MED ORDER — CEPHALEXIN 500 MG PO CAPS: 500.0000 mg | ORAL_CAPSULE | Freq: Four times a day (QID) | ORAL | 0 refills | Status: AC

## 2021-02-03 MED ORDER — METRONIDAZOLE 500 MG PO TABS: 500.0000 mg | ORAL_TABLET | Freq: Every day | ORAL | 0 refills | Status: AC

## 2021-02-03 NOTE — Patient Instructions (Addendum)
[facility] [address],  [place]  [id number]  Monday at 10:00am  Diabetes Mellitus and Nutrition, Adult When you have diabetes, or diabetes mellitus, it is very important to have healthy eating habits because your blood sugar (glucose) levels are greatly affected by what you eat and drink. Eating healthy foods in the right amounts, at about the same times every day, can help you: Control your blood glucose. Lower your risk of heart disease. Improve your blood pressure. Reach or maintain a healthy weight. What can affect my meal plan? Every person with diabetes is different, and each person has different needs for a meal plan. Your health care provider may recommend that you work with a dietitian to make a meal plan that is best for you. Your meal plan may vary depending on factors such as: The calories you need. The medicines you take. Your weight. Your blood glucose, blood pressure, and cholesterol levels. Your activity level. Other health conditions you have, such as heart or kidney disease. How do carbohydrates affect me? Carbohydrates, also called carbs, affect your blood glucose level more than any other type of food. Eating carbs naturally raises the amount of glucose in your blood. Carb counting is a method for keeping track of how many carbs you eat. Counting carbs is important to keep your blood glucose at a healthy level,especially if you use insulin or take certain oral diabetes medicines. It is important to know how many carbs you can safely have in each meal. This is different for every person. Your dietitian can help you calculate how manycarbs you should have at each meal and for each snack. How does alcohol affect me? Alcohol can cause a sudden decrease in blood glucose (hypoglycemia), especially if you use insulin or take certain oral diabetes medicines. Hypoglycemia can be a life-threatening condition.  Symptoms of hypoglycemia, such as sleepiness, dizziness, and confusion, are similar to symptoms of having too much alcohol. Do not drink alcohol if: Your health care provider tells you not to drink. You are pregnant, may be pregnant, or are planning to become pregnant. If you drink alcohol: Do not drink on an empty stomach. Limit how much you use to: 0-1 drink a day for women. 0-2 drinks a day for men. Be aware of how much alcohol is in your drink. In the [place] one drink equals one 12 oz bottle of beer (355 mL), one 5 oz glass of wine (148 mL), or one 1 oz glass of hard liquor (44 mL). Keep yourself hydrated with water, diet soda, or unsweetened iced tea. Keep in mind that regular soda, juice, and other mixers may contain a lot of sugar and must be counted as carbs. What are tips for following this plan?  Reading food labels Start by checking the serving size on the "Nutrition Facts" label of packaged foods and drinks. The amount of calories, carbs, fats, and other nutrients listed on the label is based on one serving of the item. Many items contain more than one serving per package. Check the total grams (g) of carbs in one serving. You can calculate the number of servings of carbs in one serving by dividing the total carbs by 15. For example, if a food has 30 g of total carbs per serving, it would be equal to 2 servings of carbs. Check the number of grams (g) of saturated fats and trans fats in one serving. Choose foods that have a low  amount or none of these fats. Check the number of milligrams (mg) of salt (sodium) in one serving. Most people should limit total sodium intake to less than 2,300 mg per day. Always check the nutrition information of foods labeled as "low-fat" or "nonfat." These foods may be higher in added sugar or refined carbs and should be avoided. Talk to your dietitian to identify your daily goals for nutrients listed on the label. Shopping Avoid buying canned, pre-made,  or processed foods. These foods tend to be high in fat, sodium, and added sugar. Shop around the outside edge of the grocery store. This is where you will most often find fresh fruits and vegetables, bulk grains, fresh meats, and fresh dairy. Cooking Use low-heat cooking methods, such as baking, instead of high-heat cooking methods like deep frying. Cook using healthy oils, such as olive, canola, or sunflower oil. Avoid cooking with butter, cream, or high-fat meats. Meal planning Eat meals and snacks regularly, preferably at the same times every day. Avoid going long periods of time without eating. Eat foods that are high in fiber, such as fresh fruits, vegetables, beans, and whole grains. Talk with your dietitian about how many servings of carbs you can eat at each meal. Eat 4-6 oz (112-168 g) of lean protein each day, such as lean meat, chicken, fish, eggs, or tofu. One ounce (oz) of lean protein is equal to: 1 oz (28 g) of meat, chicken, or fish. 1 egg.  cup (62 g) of tofu. Eat some foods each day that contain healthy fats, such as avocado, nuts, seeds, and fish. What foods should I eat? Fruits Berries. Apples. Oranges. Peaches. Apricots. Plums. Grapes. Mango. Papaya.Pomegranate. Kiwi. Cherries. Vegetables Lettuce. Spinach. Leafy greens, including kale, chard, collard greens, and mustard greens. Beets. Cauliflower. Cabbage. Broccoli. Carrots. Green beans.Tomatoes. Peppers. Onions. Cucumbers. Brussels sprouts. Grains Whole grains, such as whole-wheat or whole-grain bread, crackers, tortillas,cereal, and pasta. Unsweetened oatmeal. Quinoa. Ferrell or wild rice. Meats and other proteins Seafood. Poultry without skin. Lean cuts of poultry and beef. Tofu. Nuts. Seeds. Dairy Low-fat or fat-free dairy products such as milk, yogurt, and cheese. The items listed above may not be a complete list of foods and beverages you can eat. Contact a dietitian for more information. What foods should I  avoid? Fruits Fruits canned with syrup. Vegetables Canned vegetables. Frozen vegetables with butter or cream sauce. Grains Refined white flour and flour products such as bread, pasta, snack foods, andcereals. Avoid all processed foods. Meats and other proteins Fatty cuts of meat. Poultry with skin. Breaded or fried meats. Processed meat.Avoid saturated fats. Dairy Full-fat yogurt, cheese, or milk. Beverages Sweetened drinks, such as soda or iced tea. The items listed above may not be a complete list of foods and beverages you should avoid. Contact a dietitian for more information. Questions to ask a health care provider Do I need to meet with a diabetes educator? Do I need to meet with a dietitian? What number can I call if I have questions? When are the best times to check my blood glucose? Where to find more information: American Diabetes Association: diabetes.org Academy of Nutrition and Dietetics: www.eatright.[employer] of Diabetes and Digestive and Kidney Diseases: [employer] Association of Diabetes Care and Education Specialists: www.diabeteseducator.org Summary It is important to have healthy eating habits because your blood sugar (glucose) levels are greatly affected by what you eat and drink. A healthy meal plan will help you control your blood glucose and maintain a healthy lifestyle. Your  health care provider may recommend that you work with a dietitian to make a meal plan that is best for you. Keep in mind that carbohydrates (carbs) and alcohol have immediate effects on your blood glucose levels. It is important to count carbs and to use alcohol carefully. This information is not intended to replace advice given to you by your health care provider. Make sure you discuss any questions you have with your healthcare provider. Document Revised: 07/15/2019 Document Reviewed: 07/15/2019 Elsevier Patient Education  2021 [place].  Edema  Edema is when  you have too much fluid in your body or under your skin. Edema may make your legs, feet, and ankles swell up. Swelling is also common in looser tissues, like around your eyes. This is a common condition. It gets more common as you get older. There are many possible causes of edema. Eating too much salt (sodium) and being on your feet or sitting for a long time can cause edema in yourlegs, feet, and ankles. Hot weather may make edema worse. Edema is usually painless. Your skin may look swollen or shiny. Follow these instructions at home: Keep the swollen body part raised (elevated) above the level of your heart when you are sitting or lying down. Do not sit still or stand for a long time. Do not wear tight clothes. Do not wear garters on your upper legs. Exercise your legs. This can help the swelling go down. Wear elastic bandages or support stockings as told by your doctor. Eat a low-salt (low-sodium) diet to reduce fluid as told by your doctor. Depending on the cause of your swelling, you may need to limit how much fluid you drink (fluid restriction). Take over-the-counter and prescription medicines only as told by your doctor. Contact a doctor if: Treatment is not working. You have heart, liver, or kidney disease and have symptoms of edema. You have sudden and unexplained weight gain. Get help right away if: You have shortness of breath or chest pain. You cannot breathe when you lie down. You have pain, redness, or warmth in the swollen areas. You have heart, liver, or kidney disease and get edema all of a sudden. You have a fever and your symptoms get worse all of a sudden. Summary Edema is when you have too much fluid in your body or under your skin. Edema may make your legs, feet, and ankles swell up. Swelling is also common in looser tissues, like around your eyes. Raise (elevate) the swollen body part above the level of your heart when you are sitting or lying down. Follow your  doctor's instructions about diet and how much fluid you can drink (fluid restriction). This information is not intended to replace advice given to you by your health care provider. Make sure you discuss any questions you have with your healthcare provider. Document Revised: 06/02/2020 Document Reviewed: 06/02/2020 Elsevier Patient Education  [place]. Deep Vein Thrombosis  Deep vein thrombosis (DVT) is a condition in which a blood clot forms in a deep vein, such as a vein in the lower leg, thigh, pelvis, or arm. Deep veins are veins in the deep venous system. A clot is blood that has thickened into a gel or solid. This condition is serious and can be life-threatening if the clot travels to the lungs and causes a blockage (pulmonary embolism) in the arteries of the lung. A DVT can also damage veins in the leg. This can lead to long-term, or chronic, venous disease, leg pain,  swelling, discoloration, and ulcers or sores (post-thrombotic syndrome). What are the causes? This condition may be caused by: A slowdown of blood flow. Damage to a vein. A condition that causes blood to clot more easily, such as certain blood-clotting disorders. What increases the risk? The following factors may make you more likely to develop this condition: Having obesity. Being older, especially older than age 39. Being inactive (sedentary lifestyle) or not moving around. This may include: Sitting or lying down for longer than 4-6 hours other than to sleep at night. Being in the hospital, having major or lengthy surgery, or having a thin, flexible tube (central line catheter) placed in a large vein. Being pregnant, giving birth, or having recently given birth. Taking medicines that contain estrogen, such as birth control or hormone replacement therapy. Using products that contain nicotine or tobacco, especially if you use hormonal birth control. Having a history of blood clots or a blood-clotting disease, a  blood vessel disease (peripheral vascular disease), or congestive heart disease. Having a history of cancer, especially if being treated with chemotherapy. What are the signs or symptoms? Symptoms of this condition include: Swelling, pain, pressure, or tenderness in an arm or a leg. An arm or a leg becoming warm, red, or discolored. A leg turning very pale. You may have a large DVT. This is rare. If the clot is in your leg, you may notice symptoms more or have worse symptomswhen you stand or walk. In some cases, there are no symptoms. How is this diagnosed? This condition is diagnosed with: Your medical history and a physical exam. Tests, such as: Blood tests to check how well your blood clots. Doppler ultrasound. This is the best way to find a DVT. Venogram. Contrast dye is injected into a vein, and X-rays are taken to check for clots. How is this treated? Treatment for this condition depends on: The cause of your DVT. The size and location of your DVT, or having more than one DVT. Your risk for bleeding or developing more clots. Other medical conditions you may have. Treatment may include: Taking a blood thinner, also called an anticoagulant, to prevent clots from forming and growing. Wearing compression stockings, if directed. Injecting medicines into the affected vein to break up the clot (catheter-directed thrombolysis). This is used only for severe DVT and only if a specialist recommends it. Specific surgical procedures, when DVT is severe or hard to treat. These may be done to: Isolate and remove your clot. Place an inferior vena cava (IVC) filter in a large vein to catch blood clots before they reach your lungs. You may get some medical treatments for 6 months or longer. Follow these instructions at home: If you are taking blood thinners: Talk with your health care provider before you take any medicines that contain aspirin or NSAIDs, such as ibuprofen. These medicines  increase your risk for dangerous bleeding. Take your medicine exactly as told, at the same time every day. Do not skip a dose. Do not take more than the prescribed dose. This is important. Ask your health care provider about foods and medicines that could change the way your blood thinner works (may interact). Avoid these foods and medicines if you are told to do so. Avoid anything that may cause bleeding or bruising. You may bleed more easily while taking blood thinners. Be very careful when using knives, scissors, or other sharp objects. Use an electric razor instead of a blade. Avoid activities that could cause injury or bruising, and  follow instructions for preventing falls. Tell your health care provider if you have had any internal bleeding, bleeding ulcers, or neurologic diseases, such as strokes or cerebral aneurysms. Wear a medical alert bracelet or carry a card that lists what medicines you take. General instructions Take over-the-counter and prescription medicines only as told by your health care provider. Return to your normal activities as told by your health care provider. Ask your health care provider what activities are safe for you. If recommended, wear compression stockings as told by your health care provider. These stockings help to prevent blood clots and reduce swelling in your legs. Keep all follow-up visits as told by your health care provider. This is important. Contact a health care provider if: You miss a dose of your blood thinner. You have new or worse pain, swelling, or redness in an arm or a leg. You have worsening numbness or tingling in an arm or a leg. You have unusual bruising. Get help right away if: You have signs or symptoms that a blood clot has moved to the lungs. These may include: Shortness of breath. Chest pain. Fast or irregular heartbeats (palpitations). Light-headedness or dizziness. Coughing up blood. You have signs or symptoms that your blood  is too thin. These may include: Blood in your vomit, stool, or urine. A cut that will not stop bleeding. A menstrual period that is heavier than usual. A severe headache or confusion. These symptoms may represent a serious problem that is an emergency. Do not wait to see if the symptoms will go away. Get medical help right away. Call your local emergency services (911 in the [place] Do not drive yourself to the hospital. Summary Deep vein thrombosis (DVT) happens when a blood clot forms in a deep vein. This may occur in the lower leg, thigh, pelvis, or arm. Symptoms affect the arm or leg and can include swelling, pain, tenderness, warmth, redness, or discoloration. This condition may be treated with medicines or compression stockings. In severe cases, surgery may be done. If you are taking blood thinners, take them exactly as told. Do not skip a dose. Do not take more than is prescribed. Get help right away if you have shortness of breath, chest pain, fast or irregular heartbeats, or blood in your vomit, urine, or stool. This information is not intended to replace advice given to you by your health care provider. Make sure you discuss any questions you have with your healthcare provider. Document Revised: 08/02/2019 Document Reviewed: 08/02/2019 Elsevier Patient Education  2022 [employer].

## 2021-02-03 NOTE — Progress Notes (Signed)
Leah Barber, [place]  [id number] Phone:  [phone]   Fax:  [phone]   Established Patient Office Visit  Subjective:  Patient ID: Leah Barber, female    DOB: 1976-02-03  Age: 45 y.o. MRN: [id number]  CC:  Chief Complaint  Patient presents with   Follow-up    Requesting wound care. Requesting referral to Cardiology. Believes to have BV    HPI Leah Barber presents for follow up. She  has a past medical history of Allergy, Anemia, Anxiety, Arthritis, Asthma, Chronic lower back pain, Depression, Diabetes mellitus without complication ([place]), Dyspnea, GERD (gastroesophageal reflux disease), Glaucoma, Hyperlipemia, Hypertension, IIH (idiopathic intracranial hypertension), Pseudotumor cerebri (08/13/2019), and Substance abuse ([place]).   She has ongoing BL pitting peripheral edema. She has been treated with oral anbx; for right leg ulcer that has not completed resolved bactrim for which she had a reaction and doxy;cycline. She was to been seen by cardiology for evaluation however she missed 3 follow up apts and has not been seen. She reports recently having EMS called due to her being slumped over. She reports Narcan was given however it did not work. She continues to smoke with her chronic conditions. She has shortness of breath with the edema. She denies [place] being completed on her lower extremities. She now has a change in the size of her left leg. It is notably larger than the right with pain.   Past Medical History:  Diagnosis Date   Allergy    Anemia    Anxiety    Arthritis    Asthma    Chronic lower back pain    Depression    Diabetes mellitus without complication (HCC)    Dyspnea    GERD (gastroesophageal reflux disease)    Glaucoma    Hyperlipemia    Hypertension    IIH (idiopathic intracranial hypertension)    Pseudotumor cerebri 08/13/2019   Substance abuse ([place])    Rx drugs for pain medication. Has not had in 5 years.     Past Surgical History:  Procedure Laterality Date   CESAREAN SECTION     x 3   CYSTOSCOPY Bilateral 11/25/2019   Procedure: CYSTOSCOPY;  Surgeon: Lavonia Drafts, MD;  Location: [facility];  Service: Gynecology;  Laterality: Bilateral;   DERMOID CYST  EXCISION     fluid removed from brain  12/20/2018   ROBOTIC ASSISTED TOTAL HYSTERECTOMY Bilateral 11/25/2019   Procedure: XI ROBOTIC ASSISTED TOTAL HYSTERECTOMY WITH SALPINGECTOMY;  Surgeon: Lavonia Drafts, MD;  Location: [facility];  Service: Gynecology;  Laterality: Bilateral;    Family History  Problem Relation Age of Onset   Diabetes Mother    Hypertension Mother    Cancer Father    Liver cancer Father    Colon cancer Neg Hx    Esophageal cancer Neg Hx    Rectal cancer Neg Hx    Stomach cancer Neg Hx    Pancreatic cancer Neg Hx     Social History   Socioeconomic History   Marital status: Legally Separated    Spouse name: Not on file   Number of children: Not on file   Years of education: Not on file   Highest education level: Not on file  Occupational History   Not on file  Tobacco Use   Smoking status: Every Day    Packs/day: 1.00    Pack years: 0.00    Types: Cigarettes   Smokeless  tobacco: Never  Vaping Use   Vaping Use: Never used  Substance and Sexual Activity   Alcohol use: Yes    Comment: occasional   Drug use: No   Sexual activity: Not Currently  Other Topics Concern   Not on file  Social History Narrative   Not on file   Social Determinants of Health   Financial Resource Strain: High Risk   Difficulty of Paying Living Expenses: Hard  Food Insecurity: Food Insecurity Present   Worried About Running Out of Food in the Last Year: Sometimes true   Ran Out of Food in the Last Year: Sometimes true  Transportation Needs: Unmet Transportation Needs   Lack of Transportation (Medical): Yes   Lack of Transportation (Non-Medical): Not on file  Physical  Activity: Insufficiently Active   Days of Exercise per Week: 7 days   Minutes of Exercise per Session: 20 min  Stress: Stress Concern Present   Feeling of Stress : To some extent  Social Connections: Socially Isolated   Frequency of Communication with Friends and Family: More than three times a week   Frequency of Social Gatherings with Friends and Family: More than three times a week   Attends Religious Services: Never   Marine scientist or Organizations: No   Attends Music therapist: Never   Marital Status: Divorced  Human resources officer Violence: Not At Risk   Fear of Current or Ex-Partner: No   Emotionally Abused: No   Physically Abused: No   Sexually Abused: No    Outpatient Medications Prior to Visit  Medication Sig Dispense Refill   Albuterol Sulfate (PROAIR RESPICLICK) 017 (90 Base) MCG/ACT AEPB Inhale 2 puffs into the lungs 4 (four) times daily as needed. 1 each 5   amLODipine (NORVASC) 10 MG tablet Take 1 tablet (10 mg total) by mouth daily. 90 tablet 3   buprenorphine (SUBUTEX) 8 MG SUBL SL tablet Place 8 mg under the tongue every 8 (eight) hours.     celecoxib (CELEBREX) 200 MG capsule TAKE 1 CAPSULE BY MOUTH TWICE A DAY 30 capsule 0   cetirizine (ZYRTEC) 10 MG tablet Take 1 tablet (10 mg total) by mouth daily. 90 tablet 3   cloNIDine (CATAPRES) 0.1 MG tablet TAKE 1 TABLET BY MOUTH 3 TIMES DAILY. 494 tablet 3   CONCERTA 18 MG CR tablet Take 18 mg by mouth daily as needed.     CONCERTA 36 MG CR tablet Take 36 mg by mouth daily as needed (ADHD).      docusate sodium (COLACE) 100 MG capsule Take 100 mg by mouth 2 (two) times daily.     DULoxetine (CYMBALTA) 60 MG capsule TAKE 1 CAPSULE (60 MG TOTAL) BY MOUTH 2 (TWO) TIMES DAILY. 180 capsule 2   FLUoxetine (PROZAC) 20 MG capsule TAKE 3 CAPSULES BY MOUTH EVERY MORNING 270 capsule 1   fluticasone (FLONASE) 50 MCG/ACT nasal spray USE 2 SPRAYS IN EACH NOSTRIL DAILY (Patient taking differently: Place 2 sprays into  both nostrils daily. USE 2 SPRAYS IN EACH NOSTRIL DAILY) 48 mL 1   furosemide (LASIX) 20 MG tablet Take 20 mg by mouth daily.     furosemide (LASIX) 40 MG tablet TAKE 1 TABLET BY MOUTH EVERY DAY 7 tablet 0   gabapentin (NEURONTIN) 400 MG capsule Take 2 capsules (800 mg total) by mouth 3 (three) times daily. 180 capsule 11   hydrOXYzine (VISTARIL) 50 MG capsule Take 50 mg by mouth 4 (four) times daily as needed for anxiety.  ibuprofen (ADVIL) 800 MG tablet Take 1 tablet (800 mg total) by mouth every 8 (eight) hours as needed. 30 tablet 5   latanoprost (XALATAN) 0.005 % ophthalmic solution Place 1 drop into both eyes at bedtime. 2.5 mL 0   metFORMIN (GLUCOPHAGE) 500 MG tablet Take 1 tablet (500 mg total) by mouth 2 (two) times daily with a meal. 180 tablet 3   Multiple Vitamins-Minerals (WOMENS MULTIVITAMIN PO) Take 1 tablet by mouth daily.      omeprazole (PRILOSEC) 40 MG capsule Take 1 capsule (40 mg total) by mouth daily. 90 capsule 3   ondansetron (ZOFRAN) 4 MG tablet TAKE 1 TABLET (4 MG TOTAL) BY MOUTH EVERY 6 (SIX) HOURS AS NEEDED FOR NAUSEA OR VOMITING 60 tablet 2   rosuvastatin (CRESTOR) 5 MG tablet TAKE 1 TABLET BY MOUTH EVERY DAY 90 tablet 3   clonazePAM (KLONOPIN) 0.5 MG tablet      FLUoxetine (PROZAC) 40 MG capsule Take 1 capsule (40 mg total) by mouth every morning. 90 capsule 0   ibuprofen (ADVIL) 600 MG tablet 1 TABLET BY MOUTH EVERY 6HRS, NOT TO EXCEED 3200MG  PER DAY.     spironolactone (ALDACTONE) 25 MG tablet Take 1 tablet (25 mg total) by mouth daily for 7 days. 7 tablet 0   zolpidem (AMBIEN) 5 MG tablet Take 1 tablet (5 mg total) by mouth at bedtime as needed for up to 5 days for sleep. 5 tablet 0   No facility-administered medications prior to visit.    Allergies  Allergen Reactions   Clindamycin/Lincomycin Nausea Only   Other Other (See Comments)    REFUSED BLOOD TRANSFUSION AND BLOOD PRODUCTS   Hydrocodone Nausea And Vomiting and Other (See Comments)    stomach  pain    Morphine Nausea And Vomiting    ROS Review of Systems  Skin:        Reports being in fecal matter in her shower in 2021 last summer     Objective:    Physical Exam Constitutional:      Appearance: She is obese.  HENT:     Head: Normocephalic and atraumatic.  Cardiovascular:     Rate and Rhythm: Normal rate and regular rhythm.     Pulses: Normal pulses.     Heart sounds: Normal heart sounds.  Pulmonary:     Effort: Pulmonary effort is normal. No respiratory distress.     Breath sounds: No stridor. No wheezing or rhonchi.  Chest:     Chest wall: No tenderness.  Abdominal:     Palpations: Abdomen is soft.  Musculoskeletal:        General: Swelling present.     Right lower leg: Edema present.     Left lower leg: Edema (with pain and asymmetry) present.  Skin:    Capillary Refill: Capillary refill takes less than 2 seconds.     Findings: Lesion present.     Comments: Nickel size scabbed lesion to right shin  Dark discoloration of right ankle   Neurological:     General: No focal deficit present.     Mental Status: She is alert and oriented to person, place, and time.  Psychiatric:        Mood and Affect: Mood normal.        Behavior: Behavior normal.    BP 140/88 (BP Location: Right Arm, Patient Position: Sitting)   Pulse 82   Temp (!) 97 F (36.1 C)   Ht 5\' 4"  (1.626 m)   Wt 210  lb 0.4 oz (95.3 kg)   LMP 09/14/2019 (Approximate)   SpO2 94%   BMI 36.05 kg/m  Wt Readings from Last 3 Encounters:  02/03/21 210 lb 0.4 oz (95.3 kg)  01/17/21 214 lb 15.2 oz (97.5 kg)  01/11/21 211 lb (95.7 kg)     Health Maintenance Due  Topic Date Due   COVID-19 Vaccine (1) Never done   Pneumococcal Vaccine 19-74 Years old (1 - PCV) Never done    There are no preventive care reminders to display for this patient.  Lab Results  Component Value Date   TSH 1.610 12/24/2019   Lab Results  Component Value Date   WBC 7.5 01/17/2021   HGB 12.6 01/17/2021   HCT 37.0  01/17/2021   MCV 94.2 01/17/2021   PLT 186 01/17/2021   Lab Results  Component Value Date   NA 143 01/17/2021   K 3.2 (L) 01/17/2021   CO2 26 01/17/2021   GLUCOSE 148 (H) 01/17/2021   BUN 12 01/17/2021   CREATININE 0.88 01/17/2021   BILITOT 0.3 01/17/2021   ALKPHOS 86 01/17/2021   AST 22 01/17/2021   ALT 23 01/17/2021   PROT 6.4 (L) 01/17/2021   ALBUMIN 3.6 01/17/2021   CALCIUM 9.1 01/17/2021   ANIONGAP 8 01/17/2021   Lab Results  Component Value Date   CHOL 275 (H) 05/17/2020   Lab Results  Component Value Date   HDL 49 05/17/2020   Lab Results  Component Value Date   LDLCALC 201 (H) 05/17/2020   Lab Results  Component Value Date   TRIG 134 05/17/2020   Lab Results  Component Value Date   CHOLHDL 5.6 (H) 05/17/2020   Lab Results  Component Value Date   HGBA1C 8.0 (A) 11/11/2020   HGBA1C 8.0 11/11/2020   HGBA1C 8.0 (A) 11/11/2020   HGBA1C 8.0 (A) 11/11/2020      Assessment & Plan:   Problem List Items Addressed This Visit   None Visit Diagnoses     Edema, unspecified type    -  Primary Worsening New referral for cardiology  Unable to get [place] until Monday due to her schedule Continue furosemide at current dose will adjust if anbx is ineffective and DVT is negative Encourage compliance with current treatment plan Work up to evaluate for Heart failure, hepatic and renal disease, venous insufficiency and pulmonary hypertension  Avoid sitting or staying in one position for long periods of time. Encourage movement Avoid eating too much salt Avoid NSAIDs Recommend elevation above heart level several times and day  Proper fitting compression socks or stocking Skin protection: keep skin clean, moisturized and free of injury    Relevant Orders   VAS [place] LOWER EXTREMITY VENOUS (DVT)   Ambulatory referral to Wound Clinic   WOUND CULTURE   Type 2 diabetes mellitus without complication, without long-term current use of insulin (HCC)     Stable  Encourage  compliance with current treatment regimen  Encourage regular CBG monitoring Encourage contacting office if excessive hyperglycemia and or hypoglycemia Lifestyle modification with healthy diet (fewer calories, more high fiber foods, whole grains and non-starchy vegetables, lower fat meat and fish, low-fat diary include healthy oils) regular exercise (physical activity) and weight loss Opthalmology exam discussed  Nutritional consult recommended Regular dental visits encouraged Home BP monitoring also encouraged goal <130/80    Essential hypertension     Stable Encouraged on going compliance with current medication regimen Encouraged home monitoring and recording BP <130/80 Eating a heart-healthy diet with  less salt Encouraged regular physical activity  Recommend Weight loss   Vaginal itching       Relevant Orders   NuSwab Vaginitis Plus (VG+)   Ulcer of shin, right, with unspecified severity ([place])     Ongiong referral to wound care for evaluation   Relevant Orders   WOUND CULTURE       Meds ordered this encounter  Medications   metroNIDAZOLE (FLAGYL) 500 MG tablet    Sig: Take 1 tablet (500 mg total) by mouth 3 (three) times daily for 7 days.    Dispense:  21 tablet    Refill:  0    Order Specific Question:   Supervising Provider    Answer:   Tresa Garter [[id number]]   metroNIDAZOLE (FLAGYL) 500 MG tablet    Sig: Take 1 tablet (500 mg total) by mouth daily for 1 day. Partner treatment    Dispense:  4 tablet    Refill:  0    Order Specific Question:   Supervising Provider    Answer:   Tresa Garter [[id number]]   cephALEXin (KEFLEX) 500 MG capsule    Sig: Take 1 capsule (500 mg total) by mouth 4 (four) times daily for 7 days.    Dispense:  28 capsule    Refill:  0    Do not place medication on "Automatic Refill". Fill one day early if pharmacy is closed on scheduled refill date.    Order Specific Question:   Supervising Provider    Answer:   Tresa Garter  [[id number]]    Follow-up: Return in about 2 weeks (around 02/17/2021) for Follow up HTN [id number].    [name], NP

## 2021-02-04 DIAGNOSIS — R609 Edema, unspecified: Secondary | ICD-10-CM | POA: Diagnosis not present

## 2021-02-04 DIAGNOSIS — N898 Other specified noninflammatory disorders of vagina: Secondary | ICD-10-CM | POA: Diagnosis not present

## 2021-02-04 DIAGNOSIS — L97819 Non-pressure chronic ulcer of other part of right lower leg with unspecified severity: Secondary | ICD-10-CM | POA: Diagnosis not present

## 2021-02-07 ENCOUNTER — Ambulatory Visit (HOSPITAL_COMMUNITY)
Admission: RE | Admit: 2021-02-07 | Discharge: 2021-02-07 | Disposition: A | Discharge status: Home / Self Care | Payer: Medicare HMO | Source: Ambulatory Visit | Attending: Cardiology | Admitting: Cardiology

## 2021-02-07 ENCOUNTER — Inpatient Hospital Stay (HOSPITAL_COMMUNITY): Admission: RE | Admit: 2021-02-07 | Payer: Medicare HMO | Source: Ambulatory Visit

## 2021-02-07 ENCOUNTER — Other Ambulatory Visit: Payer: Self-pay | Admitting: Nurse Practitioner

## 2021-02-07 ENCOUNTER — Other Ambulatory Visit: Payer: Self-pay

## 2021-02-07 DIAGNOSIS — R600 Localized edema: Secondary | ICD-10-CM

## 2021-02-07 DIAGNOSIS — R609 Edema, unspecified: Secondary | ICD-10-CM

## 2021-02-07 DIAGNOSIS — I10 Essential (primary) hypertension: Secondary | ICD-10-CM

## 2021-02-07 DIAGNOSIS — R0602 Shortness of breath: Secondary | ICD-10-CM

## 2021-02-07 MED ORDER — POTASSIUM CHLORIDE CRYS ER 20 MEQ PO TBCR: 20.0000 meq | EXTENDED_RELEASE_TABLET | Freq: Two times a day (BID) | ORAL | 0 refills | Status: DC

## 2021-02-07 MED ORDER — FUROSEMIDE 40 MG PO TABS: 40.0000 mg | ORAL_TABLET | Freq: Every day | ORAL | 0 refills | Status: DC

## 2021-02-08 LAB — NUSWAB VAGINITIS PLUS (VG+)
Candida albicans, NAA: NEGATIVE
Candida glabrata, NAA: NEGATIVE
Chlamydia trachomatis, NAA: NEGATIVE
Neisseria gonorrhoeae, NAA: NEGATIVE
Trich vag by NAA: NEGATIVE

## 2021-02-13 LAB — WOUND CULTURE

## 2021-02-18 ENCOUNTER — Encounter (HOSPITAL_BASED_OUTPATIENT_CLINIC_OR_DEPARTMENT_OTHER): Payer: Medicare HMO | Attending: Internal Medicine | Admitting: Internal Medicine

## 2021-02-18 ENCOUNTER — Other Ambulatory Visit: Payer: Self-pay

## 2021-02-18 DIAGNOSIS — S81801A Unspecified open wound, right lower leg, initial encounter: Secondary | ICD-10-CM

## 2021-02-18 DIAGNOSIS — I10 Essential (primary) hypertension: Secondary | ICD-10-CM | POA: Insufficient documentation

## 2021-02-18 DIAGNOSIS — X58XXXA Exposure to other specified factors, initial encounter: Secondary | ICD-10-CM | POA: Insufficient documentation

## 2021-02-18 DIAGNOSIS — G894 Chronic pain syndrome: Secondary | ICD-10-CM | POA: Diagnosis not present

## 2021-02-18 DIAGNOSIS — I872 Venous insufficiency (chronic) (peripheral): Secondary | ICD-10-CM | POA: Diagnosis not present

## 2021-02-18 NOTE — Progress Notes (Signed)
Leah Barber, CELLI ([id number]) Visit Report for 02/18/2021 Abuse/Suicide Risk Screen Details Patient Name: Date of Service: Leah Barber 02/18/2021 7:30 A M Medical Record Number: [id number] Patient Account Number: [id number] Date of Birth/Sex: Treating RN: 12/19/1975 (45 y.o. Female) Antonieta Iba Primary Care Hannahgrace Lalli: Thad Ranger Other Clinician: Referring Keoki Mchargue: Treating Tyleah Loh/Extender: Levon Hedger, Crystal Weeks in Treatment: 0 Abuse/Suicide Risk Screen Items Answer ABUSE RISK SCREEN: Has anyone close to you tried to hurt or harm you recentlyo No Do you feel uncomfortable with anyone in your familyo No Has anyone forced you do things that you didnt want to doo No Electronic Signature(s) Signed: 02/18/2021 11:28:33 AM By: Antonieta Iba Entered By: Antonieta Iba on 02/18/2021 07:56:19 -------------------------------------------------------------------------------- Activities of Daily Living Details Patient Name: Date of Service: Leah Barber 02/18/2021 7:30 A M Medical Record Number: [id number] Patient Account Number: [id number] Date of Birth/Sex: Treating RN: [birth year] (45 y.o. Female) Antonieta Iba Primary Care Maimuna Leaman: Thad Ranger Other Clinician: Referring Adama Ferber: Treating Maralyn Witherell/Extender: Levon Hedger, Crystal Weeks in Treatment: 0 Activities of Daily Living Items Answer Activities of Daily Living (Please select one for each item) Drive Automobile Completely Able T Medications ake Completely Able Use T elephone Completely Able Care for Appearance Completely Able Use T oilet Completely Able Bath / Shower Completely Able Dress Self Completely Able Feed Self Completely Able Walk Completely Able Get In / Out Bed Completely Able Housework Completely Able Prepare Meals Completely Able Handle Money Completely Able Shop for Self Completely Able Electronic Signature(s) Signed: 02/18/2021 11:28:33 AM By: Antonieta Iba Entered By:  Antonieta Iba on 02/18/2021 07:56:43 -------------------------------------------------------------------------------- Education Screening Details Patient Name: Date of Service: Leah Orem, [name] 02/18/2021 7:30 A M Medical Record Number: [id number] Patient Account Number: [id number] Date of Birth/Sex: Treating RN: [birth year] (45 y.o. Female) Antonieta Iba Primary Care Demmi Sindt: Thad Ranger Other Clinician: Referring Salem Lembke: Treating Eduardo Wurth/Extender: Vanita Panda in Treatment: 0 Primary Learner Assessed: Patient Learning Preferences/Education Level/Primary Language Learning Preference: Explanation, Demonstration, Printed Material Highest Education Level: College or Above Preferred Language: English Cognitive Barrier Language Barrier: No Translator Needed: No Memory Deficit: No Emotional Barrier: No Cultural/Religious Beliefs Affecting Medical Care: No Physical Barrier Impaired Vision: No Impaired Hearing: No Decreased Hand dexterity: No Knowledge/Comprehension Knowledge Level: Medium Comprehension Level: Medium Ability to understand written instructions: High Ability to understand verbal instructions: High Motivation Anxiety Level: Calm Cooperation: Cooperative Education Importance: Acknowledges Need Interest in Health Problems: Asks Questions Perception: Coherent Willingness to Engage in Self-Management High Activities: Readiness to Engage in Self-Management High Activities: Electronic Signature(s) Signed: 02/18/2021 11:28:33 AM By: Antonieta Iba Entered By: Antonieta Iba on 02/18/2021 07:58:53 -------------------------------------------------------------------------------- Fall Risk Assessment Details Patient Name: Date of Service: Leah WN, [name] 02/18/2021 7:30 A M Medical Record Number: [id number] Patient Account Number: [id number] Date of Birth/Sex: Treating RN: Jan 01, 1976 (45 y.o. Female) Antonieta Iba Primary Care Breianna Delfino:  Thad Ranger Other Clinician: Referring Makhi Muzquiz: Treating Kazmir Oki/Extender: Vanita Panda in Treatment: 0 Fall Risk Assessment Items Have you had 2 or more falls in the last 12 monthso 0 Yes Have you had any fall that resulted in injury in the last 12 monthso 0 No FALLS RISK SCREEN History of falling - immediate or within 3 months 25 Yes Secondary diagnosis (Do you have 2 or more medical diagnoseso) 0 No Ambulatory aid None/bed rest/wheelchair/nurse 0 Yes Crutches/cane/walker 0 No Furniture 0 No Intravenous therapy Access/Saline/Heparin Lock 0 No Gait/Transferring Normal/ bed rest/ wheelchair 0 Yes Weak (short steps with or  without shuffle, stooped but able to lift head while walking, may seek 0 No support from furniture) Impaired (short steps with shuffle, may have difficulty arising from chair, head down, impaired 0 No balance) Mental Status Oriented to own ability 0 Yes Electronic Signature(s) Signed: 02/18/2021 11:28:33 AM By: Antonieta Iba Entered By: Antonieta Iba on 02/18/2021 07:59:09 -------------------------------------------------------------------------------- Foot Assessment Details Patient Name: Date of Service: Leah Orem, [name] 02/18/2021 7:30 A M Medical Record Number: [id number] Patient Account Number: [id number] Date of Birth/Sex: Treating RN: 1976/06/28 (45 y.o. Female) Antonieta Iba Primary Care Margretta Zamorano: Thad Ranger Other Clinician: Referring Kimberl Vig: Treating Andrej Spagnoli/Extender: Levon Hedger, Crystal Weeks in Treatment: 0 Foot Assessment Items Site Locations + = Sensation present, - = Sensation absent, C = Callus, U = Ulcer R = Redness, W = Warmth, M = Maceration, PU = Pre-ulcerative lesion F = Fissure, S = Swelling, D = Dryness Assessment Right: Left: Other Deformity: No No Prior Foot Ulcer: No No Prior Amputation: No No Charcot Joint: No No Ambulatory Status: Ambulatory Without Help Gait:  Steady Electronic Signature(s) Signed: 02/18/2021 11:28:33 AM By: Antonieta Iba Entered By: Antonieta Iba on 02/18/2021 08:08:17 -------------------------------------------------------------------------------- Nutrition Risk Screening Details Patient Name: Date of Service: KIMELA, Leah Barber. 02/18/2021 7:30 A M Medical Record Number: [id number] Patient Account Number: [id number] Date of Birth/Sex: Treating RN: 1976/05/24 (45 y.o. Female) Antonieta Iba Primary Care Ayjah Show: Thad Ranger Other Clinician: Referring Joee Iovine: Treating Nedda Gains/Extender: Levon Hedger, Crystal Weeks in Treatment: 0 Height (in): 62 Weight (lbs): 208 Body Mass Index (BMI): 38 Nutrition Risk Screening Items Score Screening NUTRITION RISK SCREEN: I have an illness or condition that made me change the kind and/or amount of food I eat 0 No I eat fewer than two meals per day 0 No I eat few fruits and vegetables, or milk products 0 No I have three or more drinks of beer, liquor or wine almost every day 0 No I have tooth or mouth problems that make it hard for me to eat 0 No I don't always have enough money to buy the food I need 0 No I eat alone most of the time 0 No I take three or more different prescribed or over-the-counter drugs a day 1 Yes Without wanting to, I have lost or gained 10 pounds in the last six months 0 No I am not always physically able to shop, cook and/or feed myself 0 No Nutrition Protocols Good Risk Protocol 0 No interventions needed Moderate Risk Protocol High Risk Proctocol Risk Level: Good Risk Score: 1 Electronic Signature(s) Signed: 02/18/2021 11:28:33 AM By: Antonieta Iba Entered By: Antonieta Iba on 02/18/2021 07:59:29

## 2021-02-18 NOTE — Progress Notes (Signed)
KYLA, DUFFY ([id number]) Visit Report for 02/18/2021 Chief Complaint Document Details Patient Name: Date of Service: LENNIX, ROTUNDO 02/18/2021 7:30 A M Medical Record Number: [id number] Patient Account Number: [id number] Date of Birth/Sex: Treating RN: 01/23/1976 (45 y.o. Female) Baruch Gouty Primary Care Provider: Dionisio David Other Clinician: Referring Provider: Treating Provider/Extender: Newton Pigg in Treatment: 0 Information Obtained from: Patient Chief Complaint Right lower extremity wound Electronic Signature(s) Signed: 02/18/2021 11:41:24 AM By: Kalman Shan DO Entered By: Kalman Shan on 02/18/2021 11:29:55 -------------------------------------------------------------------------------- Debridement Details Patient Name: Date of Service: [name], Lanna Poche S. 02/18/2021 7:30 A M Medical Record Number: [id number] Patient Account Number: [id number] Date of Birth/Sex: Treating RN: June 14, 1976 (45 y.o. Female) Baruch Gouty Primary Care Provider: Dionisio David Other Clinician: Referring Provider: Treating Provider/Extender: Newton Pigg in Treatment: 0 Debridement Performed for Assessment: Wound #1 Right,Medial Lower Leg Performed By: Physician Kalman Shan, DO Debridement Type: Debridement Level of Consciousness (Pre-procedure): Awake and Alert Pre-procedure Verification/Time Out Yes - 08:40 Taken: Start Time: 08:43 Pain Control: Other : benzocaine 20% spray T Area Debrided (L x W): otal 0.5 (cm) x 0.4 (cm) = 0.2 (cm) Tissue and other material debrided: Viable, Non-Viable, Subcutaneous, Fibrin/Exudate Level: Skin/Subcutaneous Tissue Debridement Description: Excisional Instrument: Curette Bleeding: None End Time: 08:45 Procedural Pain: 6 Post Procedural Pain: 4 Response to Treatment: Procedure was tolerated well Level of Consciousness (Post- Awake and Alert procedure): Post Debridement Measurements of  Total Wound Length: (cm) 0.5 Width: (cm) 0.4 Depth: (cm) 0.1 Volume: (cm) 0.016 Character of Wound/Ulcer Post Debridement: Improved Post Procedure Diagnosis Same as Pre-procedure Electronic Signature(s) Signed: 02/18/2021 11:41:24 AM By: Kalman Shan DO Signed: 02/18/2021 1:24:03 PM By: Baruch Gouty RN, BSN Entered By: Baruch Gouty on 02/18/2021 08:46:35 -------------------------------------------------------------------------------- HPI Details Patient Name: Date of Service: [name], Deandrea S. 02/18/2021 7:30 A M Medical Record Number: [id number] Patient Account Number: [id number] Date of Birth/Sex: Treating RN: 12-May-1976 (45 y.o. Female) Baruch Gouty Primary Care Provider: Dionisio David Other Clinician: Referring Provider: Treating Provider/Extender: Newton Pigg in Treatment: 0 History of Present Illness HPI Description: Admission 7/1 Ms. Arsenia Goracke is a 45 year old female with a past medical history of essential hypertension, chronic pain syndrome, polysubstance abuse that presents to our clinic for a 1-47-month history of nonhealing wound to the right leg. She states that this started as an abscess and after drainage this has been healing however has not closed yet. She has been using honey on this daily. She reports tenderness to the area. She denies increased warmth erythema or purulent drainage. Electronic Signature(s) Signed: 02/18/2021 11:41:24 AM By: Kalman Shan DO Entered By: Kalman Shan on 02/18/2021 11:33:42 -------------------------------------------------------------------------------- Physical Exam Details Patient Name: Date of Service: [name], Lanna Poche S. 02/18/2021 7:30 A M Medical Record Number: [id number] Patient Account Number: [id number] Date of Birth/Sex: Treating RN: 11-Nov-1975 (45 y.o. Female) Baruch Gouty Primary Care Provider: Dionisio David Other Clinician: Referring Provider: Treating Provider/Extender:  Wilburt Finlay, Crystal Weeks in Treatment: 0 Constitutional respirations regular, non-labored and within target range for patient.. Cardiovascular 2+ dorsalis pedis/posterior tibialis pulses. Psychiatric pleasant and cooperative. Notes Right lower extremity: On the medial aspect there is a small open wound with nonviable tissue present. This was removed and underneath there is some granulation tissue with still some fibrinous debris. But overall no signs of infection including induration or drainage and looks well-healing. 2+ pitting edema to the legs bilaterally Electronic Signature(s) Signed: 02/18/2021 11:44:21 AM By: Kalman Shan DO Previous Signature:  02/18/2021 11:41:24 AM Version By: Kalman Shan DO Entered By: Kalman Shan on 02/18/2021 11:43:07 -------------------------------------------------------------------------------- Physician Orders Details Patient Name: Date of Service: [name], Murrell S. 02/18/2021 7:30 A M Medical Record Number: [id number] Patient Account Number: [id number] Date of Birth/Sex: Treating RN: 19-Nov-1975 (45 y.o. Female) Baruch Gouty Primary Care Provider: Dionisio David Other Clinician: Referring Provider: Treating Provider/Extender: Newton Pigg in Treatment: 0 Verbal / Phone Orders: No Diagnosis Coding ICD-10 Coding Code Description S81.801A Unspecified open wound, right lower leg, initial encounter Follow-up Appointments Return Appointment in 1 week. Bathing/ Shower/ Hygiene May shower with protection but do not get wound dressing(s) wet. Edema Control - Lymphedema / SCD / Other Bilateral Lower Extremities Elevate legs to the level of the heart or above for 30 minutes daily and/or when sitting, a frequency of: - throughout the day Avoid standing for long periods of time. Exercise regularly Wound Treatment Wound #1 - Lower Leg Wound Laterality: Right, Medial Peri-Wound Care: Sween Lotion  (Moisturizing lotion) 1 x Per Week/7 Days Discharge Instructions: Apply moisturizing lotion as directed Prim Dressing: Hydrofera Blue Ready Foam, 2.5 x2.5 in 1 x Per Week/7 Days ary Discharge Instructions: Apply to wound bed as instructed Prim Dressing: Santyl Ointment 1 x Per Week/7 Days ary Discharge Instructions: Apply nickel thick amount to wound bed as instructed Secondary Dressing: Woven Gauze Sponge, Non-Sterile 4x4 in 1 x Per Week/7 Days Discharge Instructions: Apply over primary dressing as directed. Compression Wrap: ThreePress (3 layer compression wrap) 1 x Per Week/7 Days Discharge Instructions: Apply three layer compression as directed. Electronic Signature(s) Signed: 02/18/2021 11:41:24 AM By: Kalman Shan DO Entered By: Kalman Shan on 02/18/2021 11:37:23 -------------------------------------------------------------------------------- Problem List Details Patient Name: Date of Service: [name], Lanna Poche S. 02/18/2021 7:30 A M Medical Record Number: [id number] Patient Account Number: [id number] Date of Birth/Sex: Treating RN: [birth year] (45 y.o. Female) Baruch Gouty Primary Care Provider: Dionisio David Other Clinician: Referring Provider: Treating Provider/Extender: Newton Pigg in Treatment: 0 Active Problems ICD-10 Encounter Code Description Active Date MDM Diagnosis S81.801A Unspecified open wound, right lower leg, initial encounter 02/18/2021 No Yes I87.2 Venous insufficiency (chronic) (peripheral) 02/18/2021 No Yes Inactive Problems Resolved Problems Electronic Signature(s) Signed: 02/18/2021 11:41:24 AM By: Kalman Shan DO Entered By: Kalman Shan on 02/18/2021 11:37:09 -------------------------------------------------------------------------------- Progress Note Details Patient Name: Date of Service: [name], Lanna Poche S. 02/18/2021 7:30 A M Medical Record Number: [id number] Patient Account Number: [id number] Date of  Birth/Sex: Treating RN: [birth year] (45 y.o. Female) Baruch Gouty Primary Care Provider: Dionisio David Other Clinician: Referring Provider: Treating Provider/Extender: Newton Pigg in Treatment: 0 Subjective Chief Complaint Information obtained from Patient Right lower extremity wound History of Present Illness (HPI) Admission 7/1 Ms. Reya Aurich is a 45 year old female with a past medical history of essential hypertension, chronic pain syndrome, polysubstance abuse that presents to our clinic for a 1-68-month history of nonhealing wound to the right leg. She states that this started as an abscess and after drainage this has been healing however has not closed yet. She has been using honey on this daily. She reports tenderness to the area. She denies increased warmth erythema or purulent drainage. Patient History Information obtained from Patient. Allergies clindamycin, hydrocodone, morphine Family History Cancer - Father, Diabetes - Mother, Heart Disease - Mother, Hypertension - Mother, Kidney Disease - Mother,Maternal Grandparents, Lung Disease - Father, Stroke - Mother,Maternal Grandparents, No family history of Seizures, Thyroid Problems, Tuberculosis. Social History Current every day smoker, Marital Status -  Single, Alcohol Use - Moderate, Drug Use - Prior History, Caffeine Use - Daily. Medical History Eyes Patient has history of Glaucoma Hematologic/Lymphatic Patient has history of Anemia Respiratory Patient has history of Asthma Cardiovascular Patient has history of Hypertension Endocrine Patient has history of Type II Diabetes Musculoskeletal Patient has history of Osteoarthritis Neurologic Patient has history of Neuropathy Patient is treated with Oral Agents. Blood sugar is not tested. Hospitalization/Surgery History - Cesarean Section. - Cystoscopy 2021. - Dermoid Cyst Removal 2020. - Hysterectomy 2021. Medical A Surgical History  Notes nd Gastrointestinal GERD Psychiatric Depression, Anxiety Review of Systems (ROS) Eyes Denies complaints or symptoms of Dry Eyes, Vision Changes, Glasses / Contacts. Ear/Nose/Mouth/Throat Denies complaints or symptoms of Chronic sinus problems or rhinitis. Genitourinary Denies complaints or symptoms of Frequent urination. Integumentary (Skin) Complains or has symptoms of Wounds. Objective Constitutional respirations regular, non-labored and within target range for patient.. Vitals Time Taken: 7:48 AM, Height: 62 in, Source: Stated, Weight: 208 lbs, Source: Stated, BMI: 38, Temperature: 98.4 F, Pulse: 95 bpm, Respiratory Rate: 18 breaths/min, Blood Pressure: 123/79 mmHg. Cardiovascular 2+ dorsalis pedis/posterior tibialis pulses. Psychiatric pleasant and cooperative. General Notes: Right lower extremity: On the medial aspect there is a small open wound with nonviable tissue present. This was removed and underneath there is some granulation tissue with still some fibrinous debris. But overall no signs of infection including induration or drainage and looks well-healing. 2+ pitting edema to the legs bilaterally Integumentary (Hair, Skin) Wound #1 status is Open. Original cause of wound was Other Lesion. The date acquired was: 01/10/2021. The wound is located on the Right,Medial Lower Leg. The wound measures 0.5cm length x 0.3cm width x 0.1cm depth; 0.118cm^2 area and 0.012cm^3 volume. There is Fat Layer (Subcutaneous Tissue) exposed. There is no tunneling or undermining noted. There is a medium amount of serosanguineous drainage noted. The wound margin is distinct with the outline attached to the wound base. There is large (67-100%) pink, pale granulation within the wound bed. There is no necrotic tissue within the wound bed. Assessment Active Problems ICD-10 Unspecified open wound, right lower leg, initial encounter Venous insufficiency (chronic) (peripheral) Patient presents  with a 1-75-month history of chronically nonhealing wound to the right lower extremity that was previously an abscess. She has some swelling on exam and I think she would benefit from compression therapy. Her ABIs were 1.2 on the right. The nonviable tissue was removed and there is still some fibrinous tissue with granulation tissue present. I would like to add Santyl and Hydrofera Blue under 3 layer compression. She was agreeable with this plan. We will see her back in 1 week. Procedures Wound #1 Pre-procedure diagnosis of Wound #1 is an Abscess located on the Right,Medial Lower Leg . There was a Excisional Skin/Subcutaneous Tissue Debridement with a total area of 0.2 sq cm performed by Kalman Shan, DO. With the following instrument(s): Curette to remove Viable and Non-Viable tissue/material. Material removed includes Subcutaneous Tissue and Fibrin/Exudate and after achieving pain control using Other (benzocaine 20% spray). No specimens were taken. A time out was conducted at 08:40, prior to the start of the procedure. There was no bleeding. The procedure was tolerated well with a pain level of 6 throughout and a pain level of 4 following the procedure. Post Debridement Measurements: 0.5cm length x 0.4cm width x 0.1cm depth; 0.016cm^3 volume. Character of Wound/Ulcer Post Debridement is improved. Post procedure Diagnosis Wound #1: Same as Pre-Procedure Pre-procedure diagnosis of Wound #1 is an Abscess located on the  Right,Medial Lower Leg . There was a Three Layer Compression Therapy Procedure by Leane Call, RN. Post procedure Diagnosis Wound #1: Same as Pre-Procedure Plan Follow-up Appointments: Return Appointment in 1 week. Bathing/ Shower/ Hygiene: May shower with protection but do not get wound dressing(s) wet. Edema Control - Lymphedema / SCD / Other: Elevate legs to the level of the heart or above for 30 minutes daily and/or when sitting, a frequency of: - throughout the  day Avoid standing for long periods of time. Exercise regularly WOUND #1: - Lower Leg Wound Laterality: Right, Medial Peri-Wound Care: Sween Lotion (Moisturizing lotion) 1 x Per Week/7 Days Discharge Instructions: Apply moisturizing lotion as directed Prim Dressing: Hydrofera Blue Ready Foam, 2.5 x2.5 in 1 x Per Week/7 Days ary Discharge Instructions: Apply to wound bed as instructed Prim Dressing: Santyl Ointment 1 x Per Week/7 Days ary Discharge Instructions: Apply nickel thick amount to wound bed as instructed Secondary Dressing: Woven Gauze Sponge, Non-Sterile 4x4 in 1 x Per Week/7 Days Discharge Instructions: Apply over primary dressing as directed. Com pression Wrap: ThreePress (3 layer compression wrap) 1 x Per Week/7 Days Discharge Instructions: Apply three layer compression as directed. 1. Santyl, Hydrofera Blue under 3 layer compression 2. In office sharp debridement 3. Follow-up in 1 week Electronic Signature(s) Signed: 02/18/2021 11:44:21 AM By: Kalman Shan DO Previous Signature: 02/18/2021 11:41:24 AM Version By: Kalman Shan DO Entered By: Kalman Shan on 02/18/2021 11:43:27 -------------------------------------------------------------------------------- HxROS Details Patient Name: Date of Service: [name], Marco S. 02/18/2021 7:30 A M Medical Record Number: [id number] Patient Account Number: [id number] Date of Birth/Sex: Treating RN: 1976/05/23 (45 y.o. Female) Lorrin Jackson Primary Care Provider: Dionisio David Other Clinician: Referring Provider: Treating Provider/Extender: Wilburt Finlay, Crystal Weeks in Treatment: 0 Information Obtained From Patient Eyes Complaints and Symptoms: Negative for: Dry Eyes; Vision Changes; Glasses / Contacts Medical History: Positive for: Glaucoma Ear/Nose/Mouth/Throat Complaints and Symptoms: Negative for: Chronic sinus problems or rhinitis Genitourinary Complaints and Symptoms: Negative for: Frequent  urination Integumentary (Skin) Complaints and Symptoms: Positive for: Wounds Hematologic/Lymphatic Medical History: Positive for: Anemia Respiratory Medical History: Positive for: Asthma Cardiovascular Medical History: Positive for: Hypertension Gastrointestinal Medical History: Past Medical History Notes: GERD Endocrine Medical History: Positive for: Type II Diabetes Time with diabetes: 3 years Treated with: Oral agents Blood sugar tested every day: No Immunological Musculoskeletal Medical History: Positive for: Osteoarthritis Neurologic Medical History: Positive for: Neuropathy Oncologic Psychiatric Medical History: Past Medical History Notes: Depression, Anxiety HBO Extended History Items Eyes: Glaucoma Immunizations Pneumococcal Vaccine: Received Pneumococcal Vaccination: No Implantable Devices None Hospitalization / Surgery History Type of Hospitalization/Surgery Cesarean Section Cystoscopy 2021 Dermoid Cyst Removal 2020 Hysterectomy 2021 Family and Social History Cancer: Yes - Father; Diabetes: Yes - Mother; Heart Disease: Yes - Mother; Hypertension: Yes - Mother; Kidney Disease: Yes - Mother,Maternal Grandparents; Lung Disease: Yes - Father; Seizures: No; Stroke: Yes - Mother,Maternal Grandparents; Thyroid Problems: No; Tuberculosis: No; Current every day smoker; Marital Status - Single; Alcohol Use: Moderate; Drug Use: Prior History; Caffeine Use: Daily; Financial Concerns: No; Food, Clothing or Shelter Needs: No; Support System Lacking: No; Transportation Concerns: No Electronic Signature(s) Signed: 02/18/2021 11:28:33 AM By: Lorrin Jackson Signed: 02/18/2021 11:41:24 AM By: Kalman Shan DO Entered By: Lorrin Jackson on 02/18/2021 08:43:11 -------------------------------------------------------------------------------- SuperBill Details Patient Name: Date of Service: [name], Doranne S. 02/18/2021 Medical Record Number: [id number] Patient Account  Number: [id number] Date of Birth/Sex: Treating RN: 1976/08/06 (45 y.o. Female) Baruch Gouty Primary Care Provider: Dionisio David Other Clinician: Referring Provider: Treating  Provider/Extender: Wilburt Finlay, Crystal Weeks in Treatment: 0 Diagnosis Coding ICD-10 Codes Code Description [phone] Unspecified open wound, right lower leg, initial encounter I87.2 Venous insufficiency (chronic) (peripheral) Facility Procedures CPT4 Code: [id number] Description: [place] VISIT-LEV 3 EST PT Modifier: 25 Quantity: 1 CPT4 Code: [id number] Description: 62376 - DEB SUBQ TISSUE 20 SQ CM/< ICD-10 Diagnosis Description S81.801A Unspecified open wound, right lower leg, initial encounter Modifier: Quantity: 1 Physician Procedures : CPT4 Code Description Modifier [id number] WC PHYS LEVEL 3 NEW PT ICD-10 Diagnosis Description S81.801A Unspecified open wound, right lower leg, initial encounter I87.2 Venous insufficiency (chronic) (peripheral) Quantity: 1 : [id number] - WC PHYS SUBQ TISS 20 SQ CM ICD-10 Diagnosis Description S81.801A Unspecified open wound, right lower leg, initial encounter Quantity: 1 Electronic Signature(s) Signed: 02/18/2021 11:41:24 AM By: Kalman Shan DO Entered By: Kalman Shan on 02/18/2021 11:40:56

## 2021-02-22 ENCOUNTER — Other Ambulatory Visit: Payer: Self-pay | Admitting: Nurse Practitioner

## 2021-02-22 NOTE — Progress Notes (Signed)
No Bathing: No Appetite: No Relationship With Others: No Bladder Continence: No Emotions: No Bowel Continence: No Work: No Toileting: No Drive: No Dressing: No Hobbies: No Electronic Signature(s) Signed: 02/18/2021 11:28:33 AM By: Lorrin Jackson Entered By: Lorrin Jackson on 02/18/2021 08:00:43 -------------------------------------------------------------------------------- Patient/Caregiver Education Details Patient Name: Date of Service: Leah Barber 7/1/2022andnbsp7:30 A M Medical Record Number: [id number] Patient Account Number: [id number] Date of Birth/Gender: Treating RN: 17-Sep-1975 (45 y.o. Female) Baruch Gouty Primary Care Physician: Dionisio David Other Clinician: Referring Physician: Treating Physician/Extender: Newton Pigg in Treatment: 0 Education Assessment Education Provided To: Patient Education Topics Provided Venous: Methods: Explain/Verbal Responses: Reinforcements needed, State content correctly Wound/Skin Impairment: Methods: Explain/Verbal Responses: Reinforcements needed, State content correctly Electronic Signature(s) Signed: 02/18/2021 1:24:03 PM By: Baruch Gouty RN, BSN Entered By: Baruch Gouty on 02/18/2021 09:37:19 -------------------------------------------------------------------------------- Wound Assessment Details Patient Name: Date of Service: Leah Re S. 02/18/2021 7:30 A M Medical Record Number: [id number] Patient Account Number: [id number] Date of Birth/Sex: Treating RN: 19-Sep-1975 (45 y.o. Female) Lorrin Jackson Primary Care Alisah Grandberry: Dionisio David Other Clinician: Referring Uilani Sanville: Treating  Jashawn Floyd/Extender: Wilburt Finlay, Crystal Weeks in Treatment: 0 Wound Status Wound Number: 1 Primary Abscess Etiology: Wound Location: Right, Medial Lower Leg Wound Open Wounding Event: Other Lesion Status: Date Acquired: 01/10/2021 Comorbid Glaucoma, Anemia, Asthma, Hypertension, Type II Diabetes, Weeks Of Treatment: 0 History: Osteoarthritis, Neuropathy Clustered Wound: No Photos Wound Measurements Length: (cm) 0.5 Width: (cm) 0.3 Depth: (cm) 0.1 Area: (cm) 0.118 Volume: (cm) 0.012 % Reduction in Area: 0% % Reduction in Volume: 0% Epithelialization: None Tunneling: No Undermining: No Wound Description Classification: Full Thickness Without Exposed Support Structures Wound Margin: Distinct, outline attached Exudate Amount: Medium Exudate Type: Serosanguineous Exudate Color: red, Walker Foul Odor After Cleansing: No Slough/Fibrino No Wound Bed Granulation Amount: Large (67-100%) Exposed Structure Granulation Quality: Pink, Pale Fascia Exposed: No Necrotic Amount: None Present (0%) Fat Layer (Subcutaneous Tissue) Exposed: Yes Tendon Exposed: No Muscle Exposed: No Joint Exposed: No Bone Exposed: No Treatment Notes Wound #1 (Lower Leg) Wound Laterality: Right, Medial Cleanser Peri-Wound Care Sween Lotion (Moisturizing lotion) Discharge Instruction: Apply moisturizing lotion as directed Topical Primary Dressing Hydrofera Blue Ready Foam, 2.5 x2.5 in Discharge Instruction: Apply to wound bed as instructed Santyl Ointment Discharge Instruction: Apply nickel thick amount to wound bed as instructed Secondary Dressing Woven Gauze Sponge, Non-Sterile 4x4 in Discharge Instruction: Apply over primary dressing as directed. Secured With Compression Wrap ThreePress (3 layer compression wrap) Discharge Instruction: Apply three layer compression as directed. Compression Stockings Add-Ons Electronic Signature(s) Signed: 02/18/2021 11:35:39 AM By: Sandre Kitty Signed: 02/22/2021 7:08:24 PM By: Lorrin Jackson Previous Signature: 02/18/2021 11:28:33 AM Version By: Lorrin Jackson Entered By: Sandre Kitty on 02/18/2021 11:29:36 -------------------------------------------------------------------------------- Vitals Details Patient Name: Date of Service: Leah Barber, [name] 02/18/2021 7:30 A M Medical Record Number: [id number] Patient Account Number: [id number] Date of Birth/Sex: Treating RN: [birth year] (45 y.o. Female) Lorrin Jackson Primary Care Treazure Nery: Dionisio David Other Clinician: Referring Piercen Covino: Treating Grettel Rames/Extender: Newton Pigg in Treatment: 0 Vital Signs Time Taken: 07:48 Temperature (F): 98.4 Height (in): 62 Pulse (bpm): 95 Source: Stated Respiratory Rate (breaths/min): 18 Weight (lbs): 208 Blood Pressure (mmHg): 123/79 Source: Stated Reference Range: 80 - 120 mg / dl Body Mass Index (BMI): 38 Electronic Signature(s) Signed: 02/18/2021 11:28:33 AM By: Lorrin Jackson Entered By: Lorrin Jackson on 02/18/2021 07:50:48  Leah Barber, Leah Barber ([id number]) Visit Report for 02/18/2021 Allergy List Details Patient Name: Date of Service: Leah Barber, Leah Barber 02/18/2021 7:30 A M Medical Record Number: [id number] Patient Account Number: [id number] Date of Birth/Sex: Treating RN: May 12, 1976 (45 y.o. Female) Lorrin Jackson Primary Care Lenville Hibberd: Dionisio David Other Clinician: Referring Aryianna Earwood: Treating Tericka Devincenzi/Extender: Wilburt Finlay, Crystal Weeks in Treatment: 0 Allergies Active Allergies clindamycin hydrocodone morphine Allergy Notes Electronic Signature(s) Signed: 02/18/2021 11:28:33 AM By: Lorrin Jackson Entered By: Lorrin Jackson on 02/18/2021 07:51:32 -------------------------------------------------------------------------------- Arrival Information Details Patient Name: Date of Service: Leah Barber, [name] 02/18/2021 7:30 A M Medical Record Number: [id number] Patient Account Number: [id number] Date of Birth/Sex: Treating RN: [birth year] (45 y.o. Female) Lorrin Jackson Primary Care Macyn Shropshire: Dionisio David Other Clinician: Referring Shaune Malacara: Treating Takeela Peil/Extender: Newton Pigg in Treatment: 0 Visit Information Patient Arrived: Ambulatory Arrival Time: 07:47 Transfer Assistance: None Patient Identification Verified: Yes Secondary Verification Process Completed: Yes Patient Requires Transmission-Based Precautions: No Patient Has Alerts: Yes Patient Alerts: L ABI= Electronic Signature(s) Signed: 02/18/2021 11:28:33 AM By: Lorrin Jackson Entered By: Lorrin Jackson on 02/18/2021 08:15:17 -------------------------------------------------------------------------------- Clinic Level of Care Assessment Details Patient Name: Date of Service: Leah Barber, GILLIAM. 02/18/2021 7:30 A M Medical Record Number: [id number] Patient Account Number: [id number] Date of Birth/Sex: Treating RN: 10/31/1975 (45 y.o. Female) Baruch Gouty Primary Care Haidy Kackley: Dionisio David Other  Clinician: Referring Herb Beltre: Treating Gennie Eisinger/Extender: Newton Pigg in Treatment: 0 Clinic Level of Care Assessment Items TOOL 1 Quantity Score []  - 0 Use when EandM and Procedure is performed on INITIAL visit ASSESSMENTS - Nursing Assessment / Reassessment X- 1 20 General Physical Exam (combine w/ comprehensive assessment (listed just below) when performed on new pt. evals) X- 1 25 Comprehensive Assessment (HX, ROS, Risk Assessments, Wounds Hx, etc.) ASSESSMENTS - Wound and Skin Assessment / Reassessment []  - 0 Dermatologic / Skin Assessment (not related to wound area) ASSESSMENTS - Ostomy and/or Continence Assessment and Care []  - 0 Incontinence Assessment and Management []  - 0 Ostomy Care Assessment and Management (repouching, etc.) PROCESS - Coordination of Care X - Simple Patient / Family Education for ongoing care 1 15 []  - 0 Complex (extensive) Patient / Family Education for ongoing care X- 1 10 Staff obtains Programmer, systems, Records, T Results / Process Orders est []  - 0 Staff telephones HHA, Nursing Homes / Clarify orders / etc []  - 0 Routine Transfer to another Facility (non-emergent condition) []  - 0 Routine Hospital Admission (non-emergent condition) X- 1 15 New Admissions / Biomedical engineer / Ordering NPWT Apligraf, etc. , []  - 0 Emergency Hospital Admission (emergent condition) PROCESS - Special Needs []  - 0 Pediatric / Minor Patient Management []  - 0 Isolation Patient Management []  - 0 Hearing / Language / Visual special needs []  - 0 Assessment of Community assistance (transportation, D/C planning, etc.) []  - 0 Additional assistance / Altered mentation []  - 0 Support Surface(s) Assessment (bed, cushion, seat, etc.) INTERVENTIONS - Miscellaneous []  - 0 External ear exam []  - 0 Patient Transfer (multiple staff / Civil Service fast streamer / Similar devices) []  - 0 Simple Staple / Suture removal (25 or less) []  - 0 Complex Staple /  Suture removal (26 or more) []  - 0 Hypo/Hyperglycemic Management (do not check if billed separately) X- 1 15 Ankle / Brachial Index (ABI) - do not check if billed separately Has the patient been seen at the hospital within the last three years: Yes Total Score: 100 Level Of Care: New/Established - Level  Leah Barber, Leah Barber ([id number]) Visit Report for 02/18/2021 Allergy List Details Patient Name: Date of Service: Leah Barber, Leah Barber 02/18/2021 7:30 A M Medical Record Number: [id number] Patient Account Number: [id number] Date of Birth/Sex: Treating RN: May 12, 1976 (45 y.o. Female) Lorrin Jackson Primary Care Lenville Hibberd: Dionisio David Other Clinician: Referring Aryianna Earwood: Treating Tericka Devincenzi/Extender: Wilburt Finlay, Crystal Weeks in Treatment: 0 Allergies Active Allergies clindamycin hydrocodone morphine Allergy Notes Electronic Signature(s) Signed: 02/18/2021 11:28:33 AM By: Lorrin Jackson Entered By: Lorrin Jackson on 02/18/2021 07:51:32 -------------------------------------------------------------------------------- Arrival Information Details Patient Name: Date of Service: Leah Barber, [name] 02/18/2021 7:30 A M Medical Record Number: [id number] Patient Account Number: [id number] Date of Birth/Sex: Treating RN: [birth year] (45 y.o. Female) Lorrin Jackson Primary Care Macyn Shropshire: Dionisio David Other Clinician: Referring Shaune Malacara: Treating Takeela Peil/Extender: Newton Pigg in Treatment: 0 Visit Information Patient Arrived: Ambulatory Arrival Time: 07:47 Transfer Assistance: None Patient Identification Verified: Yes Secondary Verification Process Completed: Yes Patient Requires Transmission-Based Precautions: No Patient Has Alerts: Yes Patient Alerts: L ABI= Electronic Signature(s) Signed: 02/18/2021 11:28:33 AM By: Lorrin Jackson Entered By: Lorrin Jackson on 02/18/2021 08:15:17 -------------------------------------------------------------------------------- Clinic Level of Care Assessment Details Patient Name: Date of Service: Leah Barber, GILLIAM. 02/18/2021 7:30 A M Medical Record Number: [id number] Patient Account Number: [id number] Date of Birth/Sex: Treating RN: 10/31/1975 (45 y.o. Female) Baruch Gouty Primary Care Haidy Kackley: Dionisio David Other  Clinician: Referring Herb Beltre: Treating Gennie Eisinger/Extender: Newton Pigg in Treatment: 0 Clinic Level of Care Assessment Items TOOL 1 Quantity Score []  - 0 Use when EandM and Procedure is performed on INITIAL visit ASSESSMENTS - Nursing Assessment / Reassessment X- 1 20 General Physical Exam (combine w/ comprehensive assessment (listed just below) when performed on new pt. evals) X- 1 25 Comprehensive Assessment (HX, ROS, Risk Assessments, Wounds Hx, etc.) ASSESSMENTS - Wound and Skin Assessment / Reassessment []  - 0 Dermatologic / Skin Assessment (not related to wound area) ASSESSMENTS - Ostomy and/or Continence Assessment and Care []  - 0 Incontinence Assessment and Management []  - 0 Ostomy Care Assessment and Management (repouching, etc.) PROCESS - Coordination of Care X - Simple Patient / Family Education for ongoing care 1 15 []  - 0 Complex (extensive) Patient / Family Education for ongoing care X- 1 10 Staff obtains Programmer, systems, Records, T Results / Process Orders est []  - 0 Staff telephones HHA, Nursing Homes / Clarify orders / etc []  - 0 Routine Transfer to another Facility (non-emergent condition) []  - 0 Routine Hospital Admission (non-emergent condition) X- 1 15 New Admissions / Biomedical engineer / Ordering NPWT Apligraf, etc. , []  - 0 Emergency Hospital Admission (emergent condition) PROCESS - Special Needs []  - 0 Pediatric / Minor Patient Management []  - 0 Isolation Patient Management []  - 0 Hearing / Language / Visual special needs []  - 0 Assessment of Community assistance (transportation, D/C planning, etc.) []  - 0 Additional assistance / Altered mentation []  - 0 Support Surface(s) Assessment (bed, cushion, seat, etc.) INTERVENTIONS - Miscellaneous []  - 0 External ear exam []  - 0 Patient Transfer (multiple staff / Civil Service fast streamer / Similar devices) []  - 0 Simple Staple / Suture removal (25 or less) []  - 0 Complex Staple /  Suture removal (26 or more) []  - 0 Hypo/Hyperglycemic Management (do not check if billed separately) X- 1 15 Ankle / Brachial Index (ABI) - do not check if billed separately Has the patient been seen at the hospital within the last three years: Yes Total Score: 100 Level Of Care: New/Established - Level  No Bathing: No Appetite: No Relationship With Others: No Bladder Continence: No Emotions: No Bowel Continence: No Work: No Toileting: No Drive: No Dressing: No Hobbies: No Electronic Signature(s) Signed: 02/18/2021 11:28:33 AM By: Lorrin Jackson Entered By: Lorrin Jackson on 02/18/2021 08:00:43 -------------------------------------------------------------------------------- Patient/Caregiver Education Details Patient Name: Date of Service: Leah Barber 7/1/2022andnbsp7:30 A M Medical Record Number: [id number] Patient Account Number: [id number] Date of Birth/Gender: Treating RN: 17-Sep-1975 (45 y.o. Female) Baruch Gouty Primary Care Physician: Dionisio David Other Clinician: Referring Physician: Treating Physician/Extender: Newton Pigg in Treatment: 0 Education Assessment Education Provided To: Patient Education Topics Provided Venous: Methods: Explain/Verbal Responses: Reinforcements needed, State content correctly Wound/Skin Impairment: Methods: Explain/Verbal Responses: Reinforcements needed, State content correctly Electronic Signature(s) Signed: 02/18/2021 1:24:03 PM By: Baruch Gouty RN, BSN Entered By: Baruch Gouty on 02/18/2021 09:37:19 -------------------------------------------------------------------------------- Wound Assessment Details Patient Name: Date of Service: Leah Re S. 02/18/2021 7:30 A M Medical Record Number: [id number] Patient Account Number: [id number] Date of Birth/Sex: Treating RN: 19-Sep-1975 (45 y.o. Female) Lorrin Jackson Primary Care Alisah Grandberry: Dionisio David Other Clinician: Referring Uilani Sanville: Treating  Jashawn Floyd/Extender: Wilburt Finlay, Crystal Weeks in Treatment: 0 Wound Status Wound Number: 1 Primary Abscess Etiology: Wound Location: Right, Medial Lower Leg Wound Open Wounding Event: Other Lesion Status: Date Acquired: 01/10/2021 Comorbid Glaucoma, Anemia, Asthma, Hypertension, Type II Diabetes, Weeks Of Treatment: 0 History: Osteoarthritis, Neuropathy Clustered Wound: No Photos Wound Measurements Length: (cm) 0.5 Width: (cm) 0.3 Depth: (cm) 0.1 Area: (cm) 0.118 Volume: (cm) 0.012 % Reduction in Area: 0% % Reduction in Volume: 0% Epithelialization: None Tunneling: No Undermining: No Wound Description Classification: Full Thickness Without Exposed Support Structures Wound Margin: Distinct, outline attached Exudate Amount: Medium Exudate Type: Serosanguineous Exudate Color: red, Walker Foul Odor After Cleansing: No Slough/Fibrino No Wound Bed Granulation Amount: Large (67-100%) Exposed Structure Granulation Quality: Pink, Pale Fascia Exposed: No Necrotic Amount: None Present (0%) Fat Layer (Subcutaneous Tissue) Exposed: Yes Tendon Exposed: No Muscle Exposed: No Joint Exposed: No Bone Exposed: No Treatment Notes Wound #1 (Lower Leg) Wound Laterality: Right, Medial Cleanser Peri-Wound Care Sween Lotion (Moisturizing lotion) Discharge Instruction: Apply moisturizing lotion as directed Topical Primary Dressing Hydrofera Blue Ready Foam, 2.5 x2.5 in Discharge Instruction: Apply to wound bed as instructed Santyl Ointment Discharge Instruction: Apply nickel thick amount to wound bed as instructed Secondary Dressing Woven Gauze Sponge, Non-Sterile 4x4 in Discharge Instruction: Apply over primary dressing as directed. Secured With Compression Wrap ThreePress (3 layer compression wrap) Discharge Instruction: Apply three layer compression as directed. Compression Stockings Add-Ons Electronic Signature(s) Signed: 02/18/2021 11:35:39 AM By: Sandre Kitty Signed: 02/22/2021 7:08:24 PM By: Lorrin Jackson Previous Signature: 02/18/2021 11:28:33 AM Version By: Lorrin Jackson Entered By: Sandre Kitty on 02/18/2021 11:29:36 -------------------------------------------------------------------------------- Vitals Details Patient Name: Date of Service: Leah Barber, [name] 02/18/2021 7:30 A M Medical Record Number: [id number] Patient Account Number: [id number] Date of Birth/Sex: Treating RN: [birth year] (45 y.o. Female) Lorrin Jackson Primary Care Treazure Nery: Dionisio David Other Clinician: Referring Piercen Covino: Treating Grettel Rames/Extender: Newton Pigg in Treatment: 0 Vital Signs Time Taken: 07:48 Temperature (F): 98.4 Height (in): 62 Pulse (bpm): 95 Source: Stated Respiratory Rate (breaths/min): 18 Weight (lbs): 208 Blood Pressure (mmHg): 123/79 Source: Stated Reference Range: 80 - 120 mg / dl Body Mass Index (BMI): 38 Electronic Signature(s) Signed: 02/18/2021 11:28:33 AM By: Lorrin Jackson Entered By: Lorrin Jackson on 02/18/2021 07:50:48

## 2021-02-25 ENCOUNTER — Other Ambulatory Visit: Payer: Self-pay

## 2021-02-25 ENCOUNTER — Other Ambulatory Visit: Payer: Self-pay | Admitting: Nurse Practitioner

## 2021-02-25 ENCOUNTER — Encounter (HOSPITAL_BASED_OUTPATIENT_CLINIC_OR_DEPARTMENT_OTHER): Payer: Medicare HMO | Admitting: Internal Medicine

## 2021-02-25 DIAGNOSIS — I872 Venous insufficiency (chronic) (peripheral): Secondary | ICD-10-CM

## 2021-02-25 DIAGNOSIS — I10 Essential (primary) hypertension: Secondary | ICD-10-CM | POA: Diagnosis not present

## 2021-02-25 DIAGNOSIS — G894 Chronic pain syndrome: Secondary | ICD-10-CM | POA: Diagnosis not present

## 2021-02-25 DIAGNOSIS — S81801A Unspecified open wound, right lower leg, initial encounter: Secondary | ICD-10-CM | POA: Diagnosis not present

## 2021-02-25 DIAGNOSIS — X58XXXA Exposure to other specified factors, initial encounter: Secondary | ICD-10-CM | POA: Diagnosis not present

## 2021-02-28 IMAGING — DX DG CHEST 2V
2 series · 2 of 2 positions shown · non-contrast
Comparison: Chest x-ray 01/22/2020.

CLINICAL DATA: Shortness of breath.

EXAM:
CHEST - 2 VIEW

[chest pa]
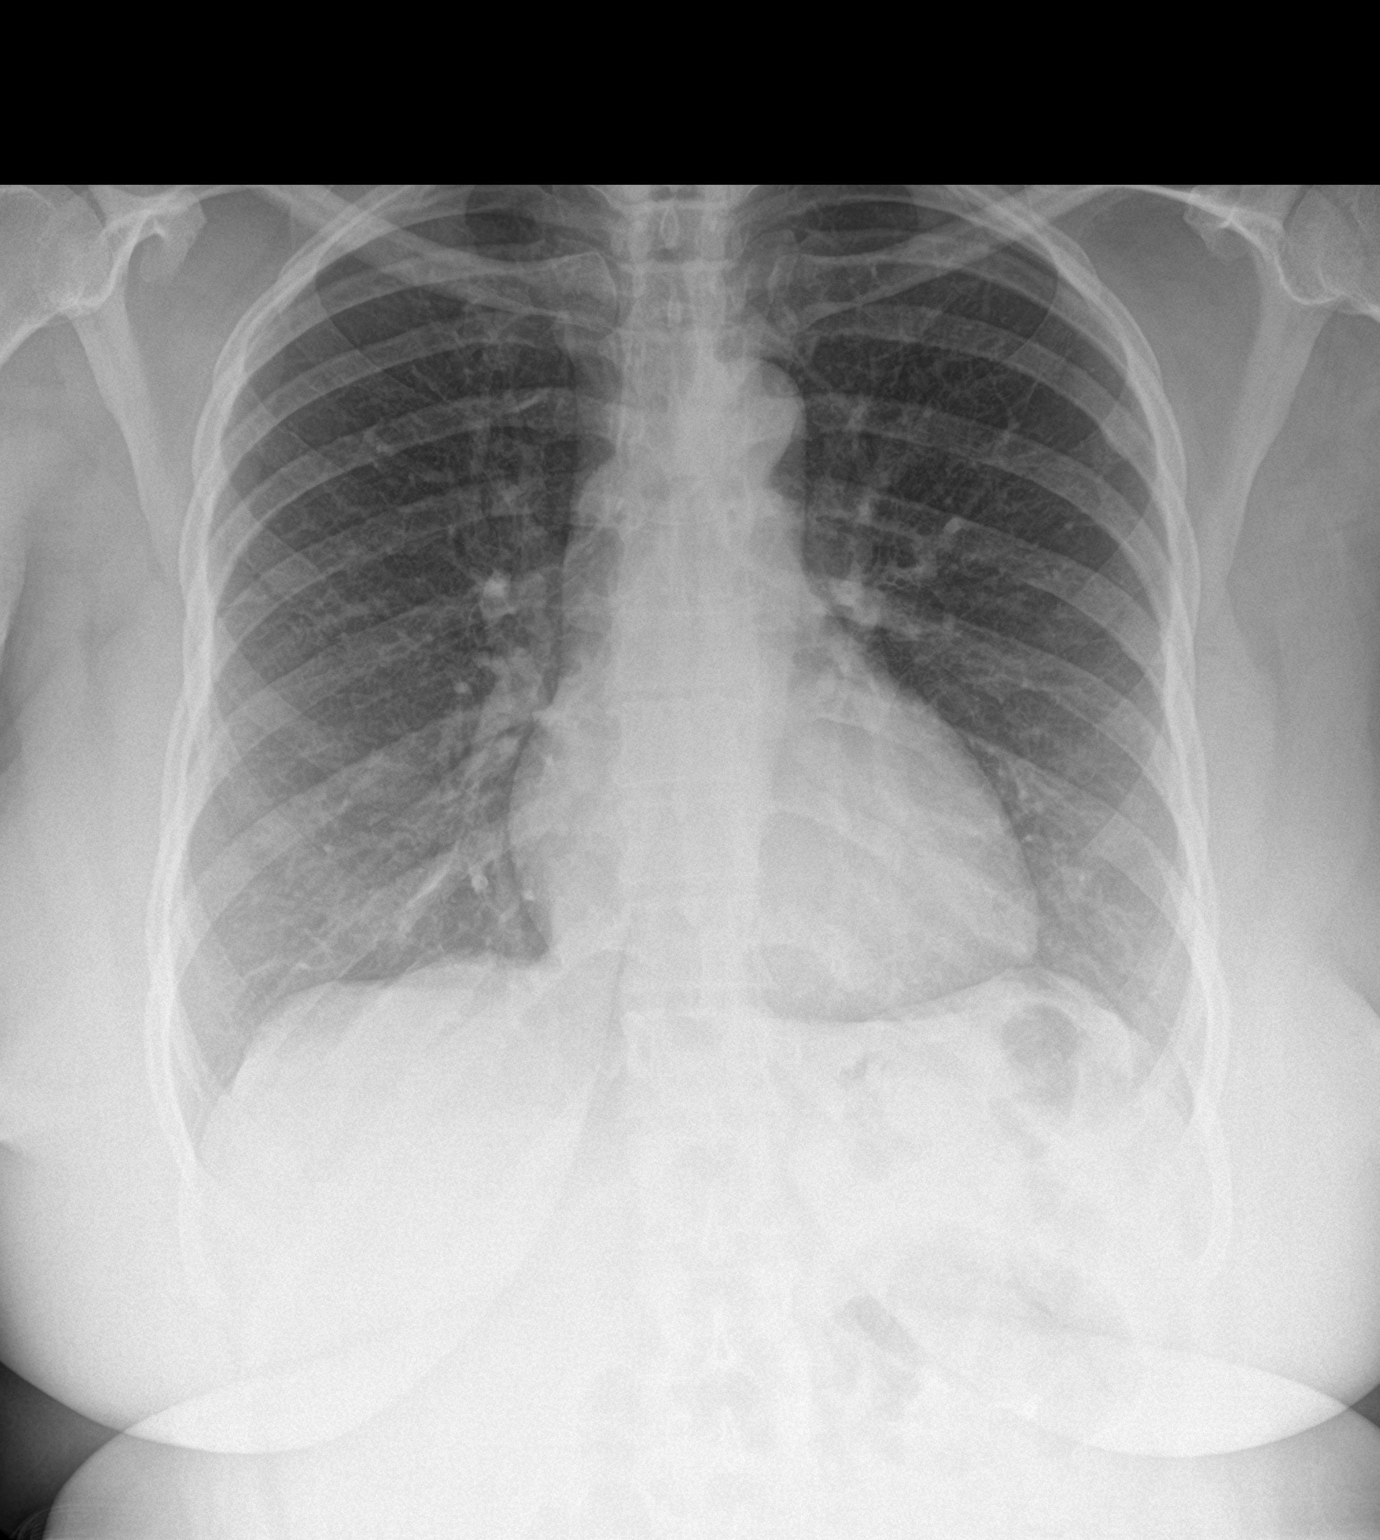

[chest lat]
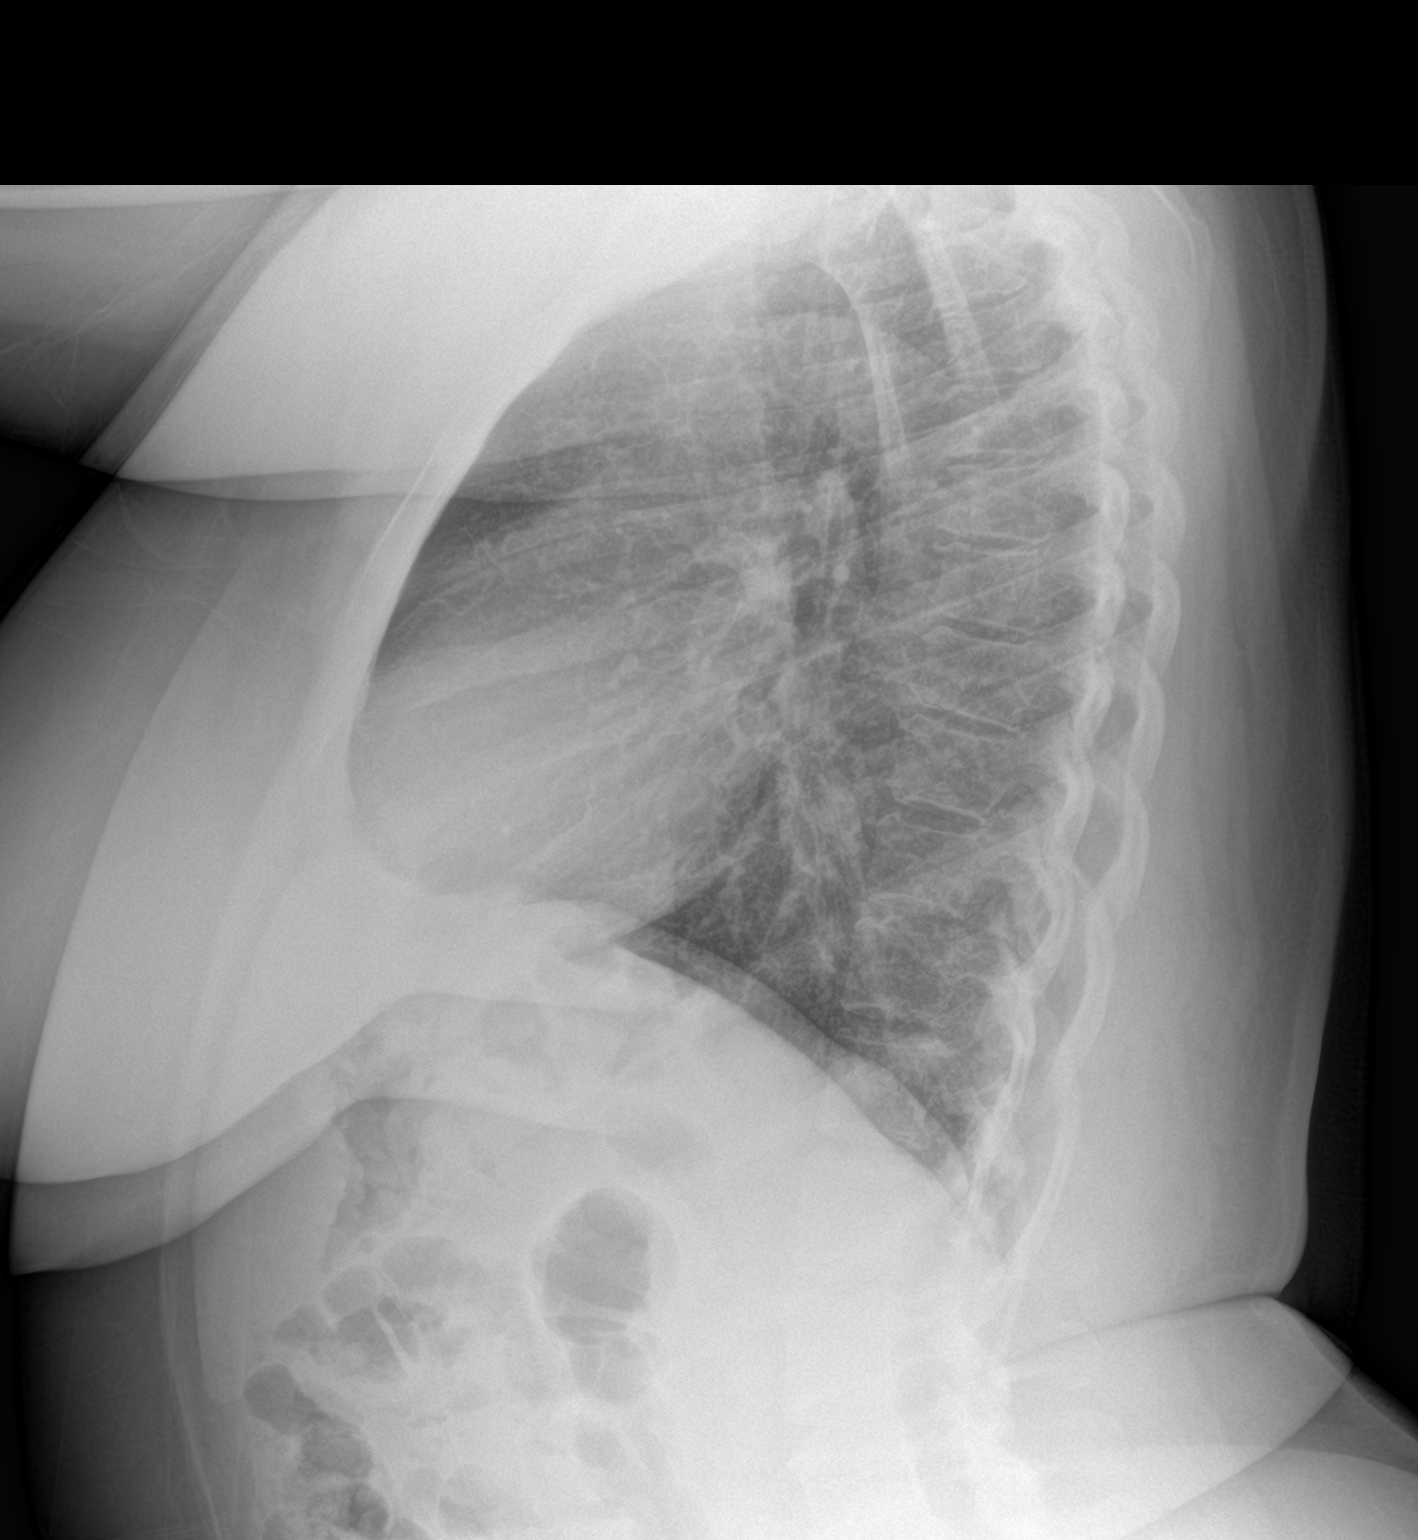

[2 of 2 positions shown; findings below may reference images not displayed]

FINDINGS: Heart size stable. Mild bilateral interstitial prominence. Mild
interstitial edema and or pneumonitis cannot be excluded. No pleural
effusion or pneumothorax. Mild degenerative change thoracic spine.
IMPRESSION: Mild bilateral interstitial prominence. Mild interstitial edema and
or pneumonitis cannot be excluded.

## 2021-03-01 NOTE — Progress Notes (Signed)
Leah, Barber ([id number]) Visit Report for 02/25/2021 Arrival Information Details Patient Name: Date of Service: Leah Barber, Leah Barber 02/25/2021 3:00 PM Medical Record Number: [id number] Patient Account Number: [id number] Date of Birth/Sex: Treating RN: 1976-03-17 (45 y.o. Leah Barber Primary Care Keyaira Clapham: Dionisio David Other Clinician: Referring Kohana Amble: Treating Jacqulin Brandenburger/Extender: Newton Pigg in Treatment: 1 Visit Information History Since Last Visit Added or deleted any medications: No Patient Arrived: Ambulatory Any new allergies or adverse reactions: No Arrival Time: 15:40 Had a fall or experienced change in No Accompanied By: self activities of daily living that may affect Transfer Assistance: None risk of falls: Patient Identification Verified: Yes Signs or symptoms of abuse/neglect since last visito No Secondary Verification Process Completed: Yes Hospitalized since last visit: No Patient Requires Transmission-Based Precautions: No Implantable device outside of the clinic excluding No Patient Has Alerts: Yes cellular tissue based products placed in the center Patient Alerts: L ABI=[place] since last visit: Has Dressing in Place as Prescribed: Yes Has Compression in Place as Prescribed: No Pain Present Now: No Electronic Signature(s) Signed: 02/25/2021 5:32:25 PM By: Deon Pilling Entered By: Deon Pilling on 02/25/2021 15:49:36 -------------------------------------------------------------------------------- Compression Therapy Details Patient Name: Date of Service: Leah Barber, Lafern S. 02/25/2021 3:00 PM Medical Record Number: [id number] Patient Account Number: [id number] Date of Birth/Sex: Treating RN: 1976/04/09 (45 y.o. Nancy Fetter Primary Care Barack Nicodemus: Dionisio David Other Clinician: Referring Eriko Economos: Treating Benjamine Strout/Extender: Newton Pigg in Treatment: 1 Compression Therapy Performed for Wound Assessment:  Wound #1 Right,Medial Lower Leg Performed By: Clinician Levan Hurst, RN Compression Type: Three Layer Post Procedure Diagnosis Same as Pre-procedure Electronic Signature(s) Signed: 03/01/2021 5:43:47 PM By: Levan Hurst RN, BSN Entered By: Levan Hurst on 02/25/2021 15:59:50 -------------------------------------------------------------------------------- Encounter Discharge Information Details Patient Name: Date of Service: Leah Barber, Dylan S. 02/25/2021 3:00 PM Medical Record Number: [id number] Patient Account Number: [id number] Date of Birth/Sex: Treating RN: [birth year] (45 y.o. Sue Lush Primary Care Georgeann Brinkman: Dionisio David Other Clinician: Referring Corrin Hingle: Treating Jeany Seville/Extender: Newton Pigg in Treatment: 1 Encounter Discharge Information Items Post Procedure Vitals Discharge Condition: Stable Temperature (F): 98.5 Ambulatory Status: Ambulatory Pulse (bpm): 93 Discharge Destination: Home Respiratory Rate (breaths/min): 20 Transportation: Private Auto Blood Pressure (mmHg): 160/99 Schedule Follow-up Appointment: Yes Clinical Summary of Care: Provided on 02/25/2021 Form Type Recipient Paper Patient Patient Electronic Signature(s) Signed: 02/25/2021 4:23:17 PM By: Lorrin Jackson Entered By: Lorrin Jackson on 02/25/2021 16:23:17 -------------------------------------------------------------------------------- Lower Extremity Assessment Details Patient Name: Date of Service: Leah Barber, [name] 02/25/2021 3:00 PM Medical Record Number: [id number] Patient Account Number: [id number] Date of Birth/Sex: Treating RN: 03-15-1976 (45 y.o. Leah Barber Primary Care Ezrael Sam: Dionisio David Other Clinician: Referring Elvia Aydin: Treating Heath Badon/Extender: Wilburt Finlay, Crystal Weeks in Treatment: 1 Edema Assessment Assessed: [Left: No] [Right: Yes] Edema: [Left: Yes] [Right: Yes] Calf Left: Right: Point of Measurement: 30 cm  From Medial Instep 38 cm Ankle Left: Right: Point of Measurement: 8 cm From Medial Instep 24 cm Vascular Assessment Pulses: Dorsalis Pedis Palpable: [Right:Yes] Electronic Signature(s) Signed: 02/25/2021 5:32:25 PM By: Deon Pilling Entered By: Deon Pilling on 02/25/2021 15:50:20 -------------------------------------------------------------------------------- Multi Wound Chart Details Patient Name: Date of Service: Leah Barber, [name] 02/25/2021 3:00 PM Medical Record Number: [id number] Patient Account Number: [id number] Date of Birth/Sex: Treating RN: February 07, 1976 (45 y.o. Nancy Fetter Primary Care Jamyron Redd: Dionisio David Other Clinician: Referring Kambre Messner: Treating Nyimah Shadduck/Extender: Wilburt Finlay, Crystal Weeks in Treatment: 1 Vital Signs Height(in): 62 Pulse(bpm): 93 Weight(lbs): 208  Leah, Barber ([id number]) Visit Report for 02/25/2021 Arrival Information Details Patient Name: Date of Service: Leah Barber, Leah Barber 02/25/2021 3:00 PM Medical Record Number: [id number] Patient Account Number: [id number] Date of Birth/Sex: Treating RN: 1976-03-17 (45 y.o. Leah Barber Primary Care Keyaira Clapham: Dionisio David Other Clinician: Referring Kohana Amble: Treating Jacqulin Brandenburger/Extender: Newton Pigg in Treatment: 1 Visit Information History Since Last Visit Added or deleted any medications: No Patient Arrived: Ambulatory Any new allergies or adverse reactions: No Arrival Time: 15:40 Had a fall or experienced change in No Accompanied By: self activities of daily living that may affect Transfer Assistance: None risk of falls: Patient Identification Verified: Yes Signs or symptoms of abuse/neglect since last visito No Secondary Verification Process Completed: Yes Hospitalized since last visit: No Patient Requires Transmission-Based Precautions: No Implantable device outside of the clinic excluding No Patient Has Alerts: Yes cellular tissue based products placed in the center Patient Alerts: L ABI=[place] since last visit: Has Dressing in Place as Prescribed: Yes Has Compression in Place as Prescribed: No Pain Present Now: No Electronic Signature(s) Signed: 02/25/2021 5:32:25 PM By: Deon Pilling Entered By: Deon Pilling on 02/25/2021 15:49:36 -------------------------------------------------------------------------------- Compression Therapy Details Patient Name: Date of Service: Leah Barber, Lafern S. 02/25/2021 3:00 PM Medical Record Number: [id number] Patient Account Number: [id number] Date of Birth/Sex: Treating RN: 1976/04/09 (45 y.o. Nancy Fetter Primary Care Barack Nicodemus: Dionisio David Other Clinician: Referring Eriko Economos: Treating Benjamine Strout/Extender: Newton Pigg in Treatment: 1 Compression Therapy Performed for Wound Assessment:  Wound #1 Right,Medial Lower Leg Performed By: Clinician Levan Hurst, RN Compression Type: Three Layer Post Procedure Diagnosis Same as Pre-procedure Electronic Signature(s) Signed: 03/01/2021 5:43:47 PM By: Levan Hurst RN, BSN Entered By: Levan Hurst on 02/25/2021 15:59:50 -------------------------------------------------------------------------------- Encounter Discharge Information Details Patient Name: Date of Service: Leah Barber, Dylan S. 02/25/2021 3:00 PM Medical Record Number: [id number] Patient Account Number: [id number] Date of Birth/Sex: Treating RN: [birth year] (45 y.o. Sue Lush Primary Care Georgeann Brinkman: Dionisio David Other Clinician: Referring Corrin Hingle: Treating Jeany Seville/Extender: Newton Pigg in Treatment: 1 Encounter Discharge Information Items Post Procedure Vitals Discharge Condition: Stable Temperature (F): 98.5 Ambulatory Status: Ambulatory Pulse (bpm): 93 Discharge Destination: Home Respiratory Rate (breaths/min): 20 Transportation: Private Auto Blood Pressure (mmHg): 160/99 Schedule Follow-up Appointment: Yes Clinical Summary of Care: Provided on 02/25/2021 Form Type Recipient Paper Patient Patient Electronic Signature(s) Signed: 02/25/2021 4:23:17 PM By: Lorrin Jackson Entered By: Lorrin Jackson on 02/25/2021 16:23:17 -------------------------------------------------------------------------------- Lower Extremity Assessment Details Patient Name: Date of Service: Leah Barber, [name] 02/25/2021 3:00 PM Medical Record Number: [id number] Patient Account Number: [id number] Date of Birth/Sex: Treating RN: 03-15-1976 (45 y.o. Leah Barber Primary Care Ezrael Sam: Dionisio David Other Clinician: Referring Elvia Aydin: Treating Heath Badon/Extender: Wilburt Finlay, Crystal Weeks in Treatment: 1 Edema Assessment Assessed: [Left: No] [Right: Yes] Edema: [Left: Yes] [Right: Yes] Calf Left: Right: Point of Measurement: 30 cm  From Medial Instep 38 cm Ankle Left: Right: Point of Measurement: 8 cm From Medial Instep 24 cm Vascular Assessment Pulses: Dorsalis Pedis Palpable: [Right:Yes] Electronic Signature(s) Signed: 02/25/2021 5:32:25 PM By: Deon Pilling Entered By: Deon Pilling on 02/25/2021 15:50:20 -------------------------------------------------------------------------------- Multi Wound Chart Details Patient Name: Date of Service: Leah Barber, [name] 02/25/2021 3:00 PM Medical Record Number: [id number] Patient Account Number: [id number] Date of Birth/Sex: Treating RN: February 07, 1976 (45 y.o. Nancy Fetter Primary Care Jamyron Redd: Dionisio David Other Clinician: Referring Kambre Messner: Treating Nyimah Shadduck/Extender: Wilburt Finlay, Crystal Weeks in Treatment: 1 Vital Signs Height(in): 62 Pulse(bpm): 93 Weight(lbs): 208  Blood Pressure(mmHg): 160/99 Body Mass Index(BMI): 38 Temperature(F): 98.5 Respiratory Rate(breaths/min): 20 Photos: [1:No Photos Right, Medial Lower Leg] [N/A:N/A N/A] Wound Location: [1:Other Lesion] [N/A:N/A] Wounding Event: [1:Abscess] [N/A:N/A] Primary Etiology: [1:Glaucoma, Anemia, Asthma,] [N/A:N/A] Comorbid History: [1:Hypertension, Type II Diabetes, Osteoarthritis, Neuropathy 01/10/2021] [N/A:N/A] Date Acquired: [1:1] [N/A:N/A] Weeks of Treatment: [1:Open] [N/A:N/A] Wound Status: [1:0.3x0.3x0.1] [N/A:N/A] Measurements L x W x D (cm) [1:0.071] [N/A:N/A] A (cm) : rea [1:0.007] [N/A:N/A] Volume (cm) : [1:39.80%] [N/A:N/A] % Reduction in A [1:rea: 41.70%] [N/A:N/A] % Reduction in Volume: [1:Full Thickness Without Exposed] [N/A:N/A] Classification: [1:Support Structures Medium] [N/A:N/A] Exudate A mount: [1:Serosanguineous] [N/A:N/A] Exudate Type: [1:red, Cobbins] [N/A:N/A] Exudate Color: [1:Distinct, outline attached] [N/A:N/A] Wound Margin: [1:Large (67-100%)] [N/A:N/A] Granulation A mount: [1:Pink, Pale] [N/A:N/A] Granulation Quality: [1:Small (1-33%)]  [N/A:N/A] Necrotic A mount: [1:Fat Layer (Subcutaneous Tissue): Yes N/A] Exposed Structures: [1:Fascia: No Tendon: No Muscle: No Joint: No Bone: No Small (1-33%)] [N/A:N/A] Epithelialization: [1:Debridement - Excisional] [N/A:N/A] Debridement: Pre-procedure Verification/Time Out 15:55 [N/A:N/A] Taken: [1:Subcutaneous, Slough] [N/A:N/A] Tissue Debrided: [1:Skin/Subcutaneous Tissue] [N/A:N/A] Level: [1:0.09] [N/A:N/A] Debridement A (sq cm): [1:rea Curette] [N/A:N/A] Instrument: [1:Minimum] [N/A:N/A] Bleeding: [1:Pressure] [N/A:N/A] Hemostasis A chieved: [1:0] [N/A:N/A] Procedural Pain: [1:0] [N/A:N/A] Post Procedural Pain: [1:Procedure was tolerated well] [N/A:N/A] Debridement Treatment Response: [1:0.3x0.3x0.1] [N/A:N/A] Post Debridement Measurements L x W x D (cm) [1:0.007] [N/A:N/A] Post Debridement Volume: (cm) [1:Compression Therapy] [N/A:N/A] Procedures Performed: [1:Debridement] Treatment Notes Wound #1 (Lower Leg) Wound Laterality: Right, Medial Cleanser Peri-Wound Care Sween Lotion (Moisturizing lotion) Discharge Instruction: Apply moisturizing lotion as directed Topical Primary Dressing Hydrofera Blue Ready Foam, 2.5 x2.5 in Discharge Instruction: Apply to wound bed as instructed Santyl Ointment Discharge Instruction: Apply nickel thick amount to wound bed as instructed Secondary Dressing Woven Gauze Sponge, Non-Sterile 4x4 in Discharge Instruction: Apply over primary dressing as directed. Secured With Compression Wrap ThreePress (3 layer compression wrap) Discharge Instruction: Apply three layer compression as directed. Compression Stockings Add-Ons Electronic Signature(s) Signed: 02/25/2021 4:45:25 PM By: Kalman Shan DO Signed: 03/01/2021 5:43:47 PM By: Levan Hurst RN, BSN Entered By: Kalman Shan on 02/25/2021 16:40:55 -------------------------------------------------------------------------------- Multi-Disciplinary Care Plan Details Patient  Name: Date of Service: Leah Barber, Krystiana S. 02/25/2021 3:00 PM Medical Record Number: [id number] Patient Account Number: [id number] Date of Birth/Sex: Treating RN: 12/28/1975 (45 y.o. Nancy Fetter Primary Care Bretta Fees: Dionisio David Other Clinician: Referring Tannen Vandezande: Treating Revia Nghiem/Extender: Newton Pigg in Treatment: 1 Multidisciplinary Care Plan reviewed with physician Active Inactive Venous Leg Ulcer Nursing Diagnoses: Knowledge deficit related to disease process and management Potential for venous Insuffiency (use before diagnosis confirmed) Goals: Patient will maintain optimal edema control Date Initiated: 02/18/2021 Target Resolution Date: 03/18/2021 Goal Status: Active Interventions: Assess peripheral edema status every visit. Compression as ordered Provide education on venous insufficiency Treatment Activities: T ordered outside of clinic : 02/18/2021 est Therapeutic compression applied : 02/18/2021 Notes: Wound/Skin Impairment Nursing Diagnoses: Impaired tissue integrity Knowledge deficit related to ulceration/compromised skin integrity Goals: Patient/caregiver will verbalize understanding of skin care regimen Date Initiated: 02/18/2021 Target Resolution Date: 03/18/2021 Goal Status: Active Ulcer/skin breakdown will have a volume reduction of 30% by week 4 Date Initiated: 02/18/2021 Target Resolution Date: 03/18/2021 Goal Status: Active Interventions: Assess patient/caregiver ability to obtain necessary supplies Assess patient/caregiver ability to perform ulcer/skin care regimen upon admission and as needed Assess ulceration(s) every visit Provide education on ulcer and skin care Treatment Activities: Skin care regimen initiated : 02/18/2021 Topical wound management initiated : 02/18/2021 Notes: Electronic Signature(s) Signed: 03/01/2021 5:43:47 PM By: Levan Hurst RN, BSN Entered  02/11/1976 (45 y.o. Helene Shoe, Tammi Klippel Primary Care Deltha Bernales: Dionisio David Other Clinician: Referring Zionah Criswell: Treating Aissatou Fronczak/Extender: Newton Pigg in Treatment: 1 Vital Signs Time Taken: 15:40 Temperature (F): 98.5 Height (in): 62 Pulse (bpm): 93 Weight (lbs): 208 Respiratory Rate (breaths/min): 20 Body Mass Index (BMI): 38 Blood Pressure (mmHg): 160/99 Reference Range: 80 - 120 mg / dl Electronic Signature(s) Signed: 02/25/2021 5:32:25 PM By: Deon Pilling Entered By: Deon Pilling on 02/25/2021 15:49:52

## 2021-03-01 NOTE — Progress Notes (Signed)
Leah Barber, Leah Barber ([id number]) Visit Report for 02/25/2021 Chief Complaint Document Details Patient Name: Date of Service: Leah Barber, Leah Barber 02/25/2021 3:00 PM Medical Record Number: [id number] Patient Account Number: [id number] Date of Birth/Sex: Treating RN: [birth year] (45 y.o. Leah Barber Primary Care Provider: Dionisio Barber Other Clinician: Referring Provider: Treating Provider/Extender: Leah Barber in Treatment: 1 Information Obtained from: Patient Chief Complaint Right lower extremity wound Electronic Signature(s) Signed: 02/25/2021 4:45:25 PM By: Leah Shan DO Entered By: Leah Barber on 02/25/2021 16:41:01 -------------------------------------------------------------------------------- Debridement Details Patient Name: Date of Service: Leah Barber, [name] 02/25/2021 3:00 PM Medical Record Number: [id number] Patient Account Number: [id number] Date of Birth/Sex: Treating RN: [birth year] (45 y.o. Leah Barber Primary Care Provider: Dionisio Barber Other Clinician: Referring Provider: Treating Provider/Extender: Leah Barber in Treatment: 1 Debridement Performed for Assessment: Wound #1 Right,Medial Lower Leg Performed By: Physician Leah Shan, DO Debridement Type: Debridement Level of Consciousness (Pre-procedure): Awake and Alert Pre-procedure Verification/Time Out Yes - 15:55 Taken: Start Time: 15:55 T Area Debrided (L x W): otal 0.3 (cm) x 0.3 (cm) = 0.09 (cm) Tissue and other material debrided: Viable, Non-Viable, Slough, Subcutaneous, Slough Level: Skin/Subcutaneous Tissue Debridement Description: Excisional Instrument: Curette Bleeding: Minimum Hemostasis Achieved: Pressure End Time: 15:56 Procedural Pain: 0 Post Procedural Pain: 0 Response to Treatment: Procedure was tolerated well Level of Consciousness (Post- Awake and Alert procedure): Post Debridement Measurements of Total Wound Length:  (cm) 0.3 Width: (cm) 0.3 Depth: (cm) 0.1 Volume: (cm) 0.007 Character of Wound/Ulcer Post Debridement: Improved Post Procedure Diagnosis Same as Pre-procedure Electronic Signature(s) Signed: 02/25/2021 4:45:25 PM By: Leah Shan DO Signed: 03/01/2021 5:43:47 PM By: Leah Hurst RN, BSN Entered By: Leah Barber on 02/25/2021 15:58:51 -------------------------------------------------------------------------------- HPI Details Patient Name: Date of Service: Leah Barber, [name] 02/25/2021 3:00 PM Medical Record Number: [id number] Patient Account Number: [id number] Date of Birth/Sex: Treating RN: Dec 30, 1975 (45 y.o. Leah Barber Primary Care Provider: Dionisio Barber Other Clinician: Referring Provider: Treating Provider/Extender: Leah Barber in Treatment: 1 History of Present Illness HPI Description: Admission 7/1 [name] is a 45 year old female with a past medical history of essential hypertension, chronic pain syndrome, polysubstance abuse that presents to our clinic for a 1-59-month history of nonhealing wound to the right leg. She states that this started as an abscess and after drainage this has been healing however has not closed yet. She has been using honey on this daily. She reports tenderness to the area. She denies increased warmth erythema or purulent drainage. 7/8; patient presents for 1 week follow-up. She has tolerated the compression wrap well. She took it off yesterday to take a shower. She has no complaints or issues today. She denies signs of infection. Electronic Signature(s) Signed: 02/25/2021 4:45:25 PM By: Leah Shan DO Entered By: Leah Barber on 02/25/2021 16:41:25 -------------------------------------------------------------------------------- Physical Exam Details Patient Name: Date of Service: Leah Barber, [name] 02/25/2021 3:00 PM Medical Record Number: [id number] Patient Account Number: [id number] Date of  Birth/Sex: Treating RN: Mar 13, 1976 (45 y.o. Leah Barber Primary Care Provider: Dionisio Barber Other Clinician: Referring Provider: Treating Provider/Extender: Leah Barber, Leah Barber in Treatment: 1 Constitutional respirations regular, non-labored and within target range for patient.. Cardiovascular 2+ dorsalis pedis/posterior tibialis pulses. Psychiatric pleasant and cooperative. Notes Right lower extremity: On the medial aspect there is a small open wound with some nonviable tissue and granulation tissue present. There are no signs of infection. 2+ pitting edema to the legs bilaterally. Electronic Signature(s)  Signed: 02/25/2021 4:45:25 PM By: Leah Shan DO Entered By: Leah Barber on 02/25/2021 16:43:01 -------------------------------------------------------------------------------- Physician Orders Details Patient Name: Date of Service: Leah Barber, [name] 02/25/2021 3:00 PM Medical Record Number: [id number] Patient Account Number: [id number] Date of Birth/Sex: Treating RN: 03/27/1976 (45 y.o. Leah Barber Primary Care Provider: Dionisio Barber Other Clinician: Referring Provider: Treating Provider/Extender: Leah Barber in Treatment: 1 Verbal / Phone Orders: No Diagnosis Coding ICD-10 Coding Code Description S81.801A Unspecified open wound, right lower leg, initial encounter I87.2 Venous insufficiency (chronic) (peripheral) Follow-up Appointments ppointment in 1 week. - with Dr. Heber [place] Barber Return A Bathing/ Shower/ Hygiene May shower with protection but do not get wound dressing(s) wet. Edema Control - Lymphedema / SCD / Other Bilateral Lower Extremities Elevate legs to the level of the heart or above for 30 minutes daily and/or when sitting, a frequency of: - throughout the day Avoid standing for long periods of time. Exercise regularly Wound Treatment Wound #1 - Lower Leg Wound Laterality: Right, Medial Peri-Wound  Care: Sween Lotion (Moisturizing lotion) 1 x Per Week/7 Days Discharge Instructions: Apply moisturizing lotion as directed Prim Dressing: Hydrofera Blue Ready Foam, 2.5 x2.5 in 1 x Per Week/7 Days ary Discharge Instructions: Apply to wound bed as instructed Prim Dressing: Santyl Ointment 1 x Per Week/7 Days ary Discharge Instructions: Apply nickel thick amount to wound bed as instructed Secondary Dressing: Woven Gauze Sponge, Non-Sterile 4x4 in 1 x Per Week/7 Days Discharge Instructions: Apply over primary dressing as directed. Compression Wrap: ThreePress (3 layer compression wrap) 1 x Per Week/7 Days Discharge Instructions: Apply three layer compression as directed. Electronic Signature(s) Signed: 02/25/2021 4:45:25 PM By: Leah Shan DO Entered By: Leah Barber on 02/25/2021 16:43:12 -------------------------------------------------------------------------------- Problem List Details Patient Name: Date of Service: Leah Barber, [name] 02/25/2021 3:00 PM Medical Record Number: [id number] Patient Account Number: [id number] Date of Birth/Sex: Treating RN: 1976-01-20 (45 y.o. Leah Barber Primary Care Provider: Dionisio Barber Other Clinician: Referring Provider: Treating Provider/Extender: Leah Barber in Treatment: 1 Active Problems ICD-10 Encounter Code Description Active Date MDM Diagnosis S81.801A Unspecified open wound, right lower leg, initial encounter 02/18/2021 No Yes I87.2 Venous insufficiency (chronic) (peripheral) 02/18/2021 No Yes Inactive Problems Resolved Problems Electronic Signature(s) Signed: 02/25/2021 4:45:25 PM By: Leah Shan DO Entered By: Leah Barber on 02/25/2021 16:40:50 -------------------------------------------------------------------------------- Progress Note Details Patient Name: Date of Service: Leah Barber, [name] 02/25/2021 3:00 PM Medical Record Number: [id number] Patient Account Number: [id number] Date of  Birth/Sex: Treating RN: [birth year] (45 y.o. Leah Barber Primary Care Provider: Dionisio Barber Other Clinician: Referring Provider: Treating Provider/Extender: Leah Barber in Treatment: 1 Subjective Chief Complaint Information obtained from Patient Right lower extremity wound History of Present Illness (HPI) Admission 7/1 Ms. Shaylea Ucci is a 45 year old female with a past medical history of essential hypertension, chronic pain syndrome, polysubstance abuse that presents to our clinic for a 1-83-month history of nonhealing wound to the right leg. She states that this started as an abscess and after drainage this has been healing however has not closed yet. She has been using honey on this daily. She reports tenderness to the area. She denies increased warmth erythema or purulent drainage. 7/8; patient presents for 1 week follow-up. She has tolerated the compression wrap well. She took it off yesterday to take a shower. She has no complaints or issues today. She denies signs of infection. Patient History Information obtained from Patient. Family History Cancer - Father, Diabetes -  Mother, Heart Disease - Mother, Hypertension - Mother, Kidney Disease - Mother,Maternal Grandparents, Lung Disease - Father, Stroke - Mother,Maternal Grandparents, No family history of Seizures, Thyroid Problems, Tuberculosis. Social History Current every day smoker, Marital Status - Single, Alcohol Use - Moderate, Drug Use - Prior History, Caffeine Use - Daily. Medical History Eyes Patient has history of Glaucoma Hematologic/Lymphatic Patient has history of Anemia Respiratory Patient has history of Asthma Cardiovascular Patient has history of Hypertension Endocrine Patient has history of Type II Diabetes Musculoskeletal Patient has history of Osteoarthritis Neurologic Patient has history of Neuropathy Hospitalization/Surgery History - Cesarean Section. - Cystoscopy  2021. - Dermoid Cyst Removal 2020. - Hysterectomy 2021. Medical A Surgical History Notes nd Gastrointestinal GERD Psychiatric Depression, Anxiety Objective Constitutional respirations regular, non-labored and within target range for patient.. Vitals Time Taken: 3:40 PM, Height: 62 in, Weight: 208 lbs, BMI: 38, Temperature: 98.5 F, Pulse: 93 bpm, Respiratory Rate: 20 breaths/min, Blood Pressure: 160/99 mmHg. Cardiovascular 2+ dorsalis pedis/posterior tibialis pulses. Psychiatric pleasant and cooperative. General Notes: Right lower extremity: On the medial aspect there is a small open wound with some nonviable tissue and granulation tissue present. There are no signs of infection. 2+ pitting edema to the legs bilaterally. Integumentary (Hair, Skin) Wound #1 status is Open. Original cause of wound was Other Lesion. The date acquired was: 01/10/2021. The wound has been in treatment 1 Barber. The wound is located on the Right,Medial Lower Leg. The wound measures 0.3cm length x 0.3cm width x 0.1cm depth; 0.071cm^2 area and 0.007cm^3 volume. There is Fat Layer (Subcutaneous Tissue) exposed. There is no tunneling or undermining noted. There is a medium amount of serosanguineous drainage noted. The wound margin is distinct with the outline attached to the wound base. There is large (67-100%) pink, pale granulation within the wound bed. There is a small (1- 33%) amount of necrotic tissue within the wound bed including Adherent Slough. Assessment Active Problems ICD-10 Unspecified open wound, right lower leg, initial encounter Venous insufficiency (chronic) (peripheral) Patient's wound has shown improvement in appearance and size since last clinic visit. There were no signs of infection on exam. I debrided nonviable tissue. I recommended continuing with Hydrofera Blue, Santyl under compression therapy and she was agreeable. I will see her back in 1 week I am hopeful that this will be closed in  the next week or 2. Procedures Wound #1 Pre-procedure diagnosis of Wound #1 is an Abscess located on the Right,Medial Lower Leg . There was a Excisional Skin/Subcutaneous Tissue Debridement with a total area of 0.09 sq cm performed by Leah Shan, DO. With the following instrument(s): Curette to remove Viable and Non-Viable tissue/material. Material removed includes Subcutaneous Tissue and Slough and. No specimens were taken. A time out was conducted at 15:55, prior to the start of the procedure. A Minimum amount of bleeding was controlled with Pressure. The procedure was tolerated well with a pain level of 0 throughout and a pain level of 0 following the procedure. Post Debridement Measurements: 0.3cm length x 0.3cm width x 0.1cm depth; 0.007cm^3 volume. Character of Wound/Ulcer Post Debridement is improved. Post procedure Diagnosis Wound #1: Same as Pre-Procedure Pre-procedure diagnosis of Wound #1 is an Abscess located on the Right,Medial Lower Leg . There was a Three Layer Compression Therapy Procedure by Leah Hurst, RN. Post procedure Diagnosis Wound #1: Same as Pre-Procedure Plan Follow-up Appointments: Return Appointment in 1 week. - with Dr. Heber Milburn Bathing/ Shower/ Hygiene: May shower with protection but do not get wound dressing(s) wet. Edema  Control - Lymphedema / SCD / Other: Elevate legs to the level of the heart or above for 30 minutes daily and/or when sitting, a frequency of: - throughout the day Avoid standing for long periods of time. Exercise regularly WOUND #1: - Lower Leg Wound Laterality: Right, Medial Peri-Wound Care: Sween Lotion (Moisturizing lotion) 1 x Per Week/7 Days Discharge Instructions: Apply moisturizing lotion as directed Prim Dressing: Hydrofera Blue Ready Foam, 2.5 x2.5 in 1 x Per Week/7 Days ary Discharge Instructions: Apply to wound bed as instructed Prim Dressing: Santyl Ointment 1 x Per Week/7 Days ary Discharge Instructions: Apply nickel  thick amount to wound bed as instructed Secondary Dressing: Woven Gauze Sponge, Non-Sterile 4x4 in 1 x Per Week/7 Days Discharge Instructions: Apply over primary dressing as directed. Com pression Wrap: ThreePress (3 layer compression wrap) 1 x Per Week/7 Days Discharge Instructions: Apply three layer compression as directed. 1. In office sharp debridement 2. Hydrofera Blue, Santyl under 3 layer compression 3. Follow-up in 1 week Electronic Signature(s) Signed: 02/25/2021 4:45:25 PM By: Leah Shan DO Entered By: Leah Barber on 02/25/2021 16:44:50 -------------------------------------------------------------------------------- HxROS Details Patient Name: Date of Service: Leah Barber, [name] 02/25/2021 3:00 PM Medical Record Number: [id number] Patient Account Number: [id number] Date of Birth/Sex: Treating RN: 25-Aug-1975 (45 y.o. Leah Barber Primary Care Provider: Dionisio Barber Other Clinician: Referring Provider: Treating Provider/Extender: Leah Barber in Treatment: 1 Information Obtained From Patient Eyes Medical History: Positive for: Glaucoma Hematologic/Lymphatic Medical History: Positive for: Anemia Respiratory Medical History: Positive for: Asthma Cardiovascular Medical History: Positive for: Hypertension Gastrointestinal Medical History: Past Medical History Notes: GERD Endocrine Medical History: Positive for: Type II Diabetes Time with diabetes: 3 years Treated with: Oral agents Blood sugar tested every day: No Musculoskeletal Medical History: Positive for: Osteoarthritis Neurologic Medical History: Positive for: Neuropathy Psychiatric Medical History: Past Medical History Notes: Depression, Anxiety HBO Extended History Items Eyes: Glaucoma Immunizations Pneumococcal Vaccine: Received Pneumococcal Vaccination: No Implantable Devices None Hospitalization / Surgery History Type of  Hospitalization/Surgery Cesarean Section Cystoscopy 2021 Dermoid Cyst Removal 2020 Hysterectomy 2021 Family and Social History Cancer: Yes - Father; Diabetes: Yes - Mother; Heart Disease: Yes - Mother; Hypertension: Yes - Mother; Kidney Disease: Yes - Mother,Maternal Grandparents; Lung Disease: Yes - Father; Seizures: No; Stroke: Yes - Mother,Maternal Grandparents; Thyroid Problems: No; Tuberculosis: No; Current every day smoker; Marital Status - Single; Alcohol Use: Moderate; Drug Use: Prior History; Caffeine Use: Daily; Financial Concerns: No; Food, Clothing or Shelter Needs: No; Support System Lacking: No; Transportation Concerns: No Electronic Signature(s) Signed: 02/25/2021 4:45:25 PM By: Leah Shan DO Signed: 03/01/2021 5:43:47 PM By: Leah Hurst RN, BSN Entered By: Leah Barber on 02/25/2021 16:41:32 -------------------------------------------------------------------------------- [place] Details Patient Name: Date of Service: Leah Barber, [name] 02/25/2021 Medical Record Number: [id number] Patient Account Number: [id number] Date of Birth/Sex: Treating RN: [birth year] (45 y.o. Leah Barber Primary Care Provider: Dionisio Barber Other Clinician: Referring Provider: Treating Provider/Extender: Leah Barber in Treatment: 1 Diagnosis Coding ICD-10 Codes Code Description [phone] Unspecified open wound, right lower leg, initial encounter I87.2 Venous insufficiency (chronic) (peripheral) Facility Procedures CPT4 Code: [id number] Description: 11042 - DEB SUBQ TISSUE 20 SQ CM/< ICD-10 Diagnosis Description S81.801A Unspecified open wound, right lower leg, initial encounter I87.2 Venous insufficiency (chronic) (peripheral) Modifier: Quantity: 1 Physician Procedures : CPT4 Code Description Modifier [id number] 11042 - WC PHYS SUBQ TISS 20 SQ CM ICD-10 Diagnosis Description S81.801A Unspecified open wound, right lower leg, initial encounter I87.2 Venous  insufficiency (chronic) (peripheral)  Quantity: 1 Electronic Signature(s) Signed: 02/25/2021 4:45:25 PM By: Leah Shan DO Entered By: Leah Barber on 02/25/2021 16:44:58

## 2021-03-04 ENCOUNTER — Other Ambulatory Visit: Payer: Self-pay | Admitting: Nurse Practitioner

## 2021-03-04 ENCOUNTER — Encounter (HOSPITAL_BASED_OUTPATIENT_CLINIC_OR_DEPARTMENT_OTHER): Payer: Medicare HMO | Admitting: Internal Medicine

## 2021-03-04 MED ORDER — FUROSEMIDE 20 MG PO TABS: 20.0000 mg | ORAL_TABLET | Freq: Every day | ORAL | 0 refills | Status: DC

## 2021-03-09 ENCOUNTER — Other Ambulatory Visit: Payer: Self-pay

## 2021-03-09 ENCOUNTER — Encounter (HOSPITAL_BASED_OUTPATIENT_CLINIC_OR_DEPARTMENT_OTHER): Payer: Medicare HMO | Admitting: Physician Assistant

## 2021-03-09 DIAGNOSIS — G894 Chronic pain syndrome: Secondary | ICD-10-CM | POA: Diagnosis not present

## 2021-03-09 DIAGNOSIS — I10 Essential (primary) hypertension: Secondary | ICD-10-CM | POA: Diagnosis not present

## 2021-03-09 DIAGNOSIS — I872 Venous insufficiency (chronic) (peripheral): Secondary | ICD-10-CM | POA: Diagnosis not present

## 2021-03-09 DIAGNOSIS — S81801A Unspecified open wound, right lower leg, initial encounter: Secondary | ICD-10-CM | POA: Diagnosis not present

## 2021-03-09 DIAGNOSIS — X58XXXA Exposure to other specified factors, initial encounter: Secondary | ICD-10-CM | POA: Diagnosis not present

## 2021-03-09 DIAGNOSIS — L97812 Non-pressure chronic ulcer of other part of right lower leg with fat layer exposed: Secondary | ICD-10-CM | POA: Diagnosis not present

## 2021-03-09 NOTE — Progress Notes (Addendum)
Leah Barber ([id number]) Visit Report for 03/09/2021 Chief Complaint Document Details Patient Name: Date of Service: Leah Barber 03/09/2021 1:15 PM Medical Record Number: [id number] Patient Account Number: [id number] Date of Birth/Sex: Treating RN: [birth year] (45 y.o. Leah Barber Primary Care Provider: Dionisio David Other Clinician: Referring Provider: Treating Provider/Extender: Marcine Matar, Crystal Weeks in Treatment: 2 Information Obtained from: Patient Chief Complaint Right lower extremity wound Electronic Signature(s) Signed: 03/09/2021 2:02:07 PM By: Worthy Keeler PA-C Entered By: Worthy Keeler on 03/09/2021 14:02:07 -------------------------------------------------------------------------------- HPI Details Patient Name: Date of Service: Leah Barber, [name] 03/09/2021 1:15 PM Medical Record Number: [id number] Patient Account Number: [id number] Date of Birth/Sex: Treating RN: 1975-10-22 (45 y.o. Leah Barber Primary Care Provider: Dionisio David Other Clinician: Referring Provider: Treating Provider/Extender: Marcine Matar, Crystal Weeks in Treatment: 2 History of Present Illness HPI Description: Admission 7/1 Ms. Leah Barber is a 45 year old female with a past medical history of essential hypertension, chronic pain syndrome, polysubstance abuse that presents to our clinic for a 1-36-month history of nonhealing wound to the right leg. She states that this started as an abscess and after drainage this has been healing however has not closed yet. She has been using honey on this daily. She reports tenderness to the area. She denies increased warmth erythema or purulent drainage. 7/8; patient presents for 1 week follow-up. She has tolerated the compression wrap well. She took it off yesterday to take a shower. She has no complaints or issues today. She denies signs of infection. 03/09/2021 upon evaluation today patient appears to be doing  well in regard to her wound. She has been tolerating the dressing changes without complication. In fact this appears to be completely healed which is great news. No fevers, chills, nausea, vomiting, or diarrhea. Electronic Signature(s) Signed: 03/09/2021 2:57:03 PM By: Worthy Keeler PA-C Entered By: Worthy Keeler on 03/09/2021 14:57:03 -------------------------------------------------------------------------------- Physical Exam Details Patient Name: Date of Service: Leah Barber 03/09/2021 1:15 PM Medical Record Number: [id number] Patient Account Number: [id number] Date of Birth/Sex: Treating RN: 1976/03/18 (45 y.o. Leah Barber Primary Care Provider: Dionisio David Other Clinician: Referring Provider: Treating Provider/Extender: Marcine Matar, Crystal Weeks in Treatment: 2 Constitutional Well-nourished and well-hydrated in no acute distress. Respiratory normal breathing without difficulty. Psychiatric this patient is able to make decisions and demonstrates good insight into disease process. Alert and Oriented x 3. pleasant and cooperative. Notes Upon inspection patient's wound bed actually showed signs of again complete epithelization and this is completely healed and overall I am extremely happy with what we see here today. The patient likewise is very pleased. Electronic Signature(s) Signed: 03/09/2021 2:57:18 PM By: Worthy Keeler PA-C Entered By: Worthy Keeler on 03/09/2021 14:57:18 -------------------------------------------------------------------------------- Physician Orders Details Patient Name: Date of Service: Leah Leah Barber, [name] 03/09/2021 1:15 PM Medical Record Number: [id number] Patient Account Number: [id number] Date of Birth/Sex: Treating RN: [birth year] (46 y.o. Leah Barber Primary Care Provider: Dionisio David Other Clinician: Referring Provider: Treating Provider/Extender: Marcine Matar, Crystal Weeks in Treatment: 2 Verbal /  Phone Orders: No Diagnosis Coding ICD-10 Coding Code Description S81.801A Unspecified open wound, right lower leg, initial encounter I87.2 Venous insufficiency (chronic) (peripheral) Discharge From [place] Discharge from [place] Bathing/ Shower/ Hygiene May shower and wash wound with soap and water. Edema Control - Lymphedema / SCD / Other Bilateral Lower Extremities Elevate legs to the level of the heart or above for  30 minutes daily and/or when sitting, a frequency of: - throughout the day Avoid standing for long periods of time. Exercise regularly Moisturize legs daily. Wound Treatment Wound #1 - Lower Leg Wound Laterality: Right, Medial Peri-Wound Care: Sween Lotion (Moisturizing lotion) 1 x Per Week/7 Days Discharge Instructions: Apply moisturizing lotion as directed Prim Dressing: Hydrofera Blue Ready Foam, 2.5 x2.5 in 1 x Per Week/7 Days ary Discharge Instructions: Apply to wound bed as instructed Prim Dressing: Santyl Ointment 1 x Per Week/7 Days ary Discharge Instructions: Apply nickel thick amount to wound bed as instructed Secondary Dressing: Woven Gauze Sponge, Non-Sterile 4x4 in 1 x Per Week/7 Days Discharge Instructions: Apply over primary dressing as directed. Compression Wrap: ThreePress (3 layer compression wrap) 1 x Per Week/7 Days Discharge Instructions: Apply three layer compression as directed. Electronic Signature(s) Signed: 03/09/2021 5:20:22 PM By: Worthy Keeler PA-C Signed: 03/09/2021 6:23:27 PM By: Baruch Gouty RN, BSN Entered By: Baruch Gouty on 03/09/2021 14:57:25 -------------------------------------------------------------------------------- Problem List Details Patient Name: Date of Service: Leah Raring, [name] 03/09/2021 1:15 PM Medical Record Number: [id number] Patient Account Number: [id number] Date of Birth/Sex: Treating RN: 07-21-1976 (45 y.o. Leah Barber Primary Care Provider: Dionisio David Other  Clinician: Referring Provider: Treating Provider/Extender: Marcine Matar, Crystal Weeks in Treatment: 2 Active Problems ICD-10 Encounter Code Description Active Date MDM Diagnosis S81.801A Unspecified open wound, right lower leg, initial encounter 02/18/2021 No Yes I87.2 Venous insufficiency (chronic) (peripheral) 02/18/2021 No Yes Inactive Problems Resolved Problems Electronic Signature(s) Signed: 03/09/2021 2:02:02 PM By: Worthy Keeler PA-C Entered By: Worthy Keeler on 03/09/2021 14:02:02 -------------------------------------------------------------------------------- Progress Note Details Patient Name: Date of Service: Leah Barber, [name] 03/09/2021 1:15 PM Medical Record Number: [id number] Patient Account Number: [id number] Date of Birth/Sex: Treating RN: 1976-03-07 (45 y.o. Leah Barber Primary Care Provider: Dionisio David Other Clinician: Referring Provider: Treating Provider/Extender: Marcine Matar, Crystal Weeks in Treatment: 2 Subjective Chief Complaint Information obtained from Patient Right lower extremity wound History of Present Illness (HPI) Admission 7/1 Ms. Leah Barber is a 45 year old female with a past medical history of essential hypertension, chronic pain syndrome, polysubstance abuse that presents to our clinic for a 1-52-month history of nonhealing wound to the right leg. She states that this started as an abscess and after drainage this has been healing however has not closed yet. She has been using honey on this daily. She reports tenderness to the area. She denies increased warmth erythema or purulent drainage. 7/8; patient presents for 1 week follow-up. She has tolerated the compression wrap well. She took it off yesterday to take a shower. She has no complaints or issues today. She denies signs of infection. 03/09/2021 upon evaluation today patient appears to be doing well in regard to her wound. She has been tolerating the dressing  changes without complication. In fact this appears to be completely healed which is great news. No fevers, chills, nausea, vomiting, or diarrhea. Objective Constitutional Well-nourished and well-hydrated in no acute distress. Vitals Time Taken: 1:53 PM, Height: 62 in, Weight: 208 lbs, BMI: 38, Temperature: 98.3 F, Pulse: 103 bpm, Respiratory Rate: 18 breaths/min, Blood Pressure: 130/92 mmHg. Respiratory normal breathing without difficulty. Psychiatric this patient is able to make decisions and demonstrates good insight into disease process. Alert and Oriented x 3. pleasant and cooperative. General Notes: Upon inspection patient's wound bed actually showed signs of again complete epithelization and this is completely healed and overall I am extremely happy with what we see here today.  The patient likewise is very pleased. Integumentary (Hair, Skin) Wound #1 status is Open. Original cause of wound was Other Lesion. The date acquired was: 01/10/2021. The wound has been in treatment 2 weeks. The wound is located on the Right,Medial Lower Leg. The wound measures 0.1cm length x 0.1cm width x 0.1cm depth; 0.008cm^2 area and 0.001cm^3 volume. There is Fat Layer (Subcutaneous Tissue) exposed. There is no tunneling or undermining noted. There is a medium amount of serosanguineous drainage noted. The wound margin is distinct with the outline attached to the wound base. There is large (67-100%) pink, pale granulation within the wound bed. There is no necrotic tissue within the wound bed. Assessment Active Problems ICD-10 Unspecified open wound, right lower leg, initial encounter Venous insufficiency (chronic) (peripheral) Plan Discharge From [facility] Services: Discharge from [place] Bathing/ Shower/ Hygiene: May shower and wash wound with soap and water. Edema Control - Lymphedema / SCD / Other: Elevate legs to the level of the heart or above for 30 minutes daily and/or when sitting, a  frequency of: - throughout the day Avoid standing for long periods of time. Exercise regularly Moisturize legs daily. WOUND #1: - Lower Leg Wound Laterality: Right, Medial Peri-Wound Care: Sween Lotion (Moisturizing lotion) 1 x Per Week/7 Days Discharge Instructions: Apply moisturizing lotion as directed Prim Dressing: Hydrofera Blue Ready Foam, 2.5 x2.5 in 1 x Per Week/7 Days ary Discharge Instructions: Apply to wound bed as instructed Prim Dressing: Santyl Ointment 1 x Per Week/7 Days ary Discharge Instructions: Apply nickel thick amount to wound bed as instructed Secondary Dressing: Woven Gauze Sponge, Non-Sterile 4x4 in 1 x Per Week/7 Days Discharge Instructions: Apply over primary dressing as directed. Com pression Wrap: ThreePress (3 layer compression wrap) 1 x Per Week/7 Days Discharge Instructions: Apply three layer compression as directed. 1. I would recommend currently that we go ahead and have her wear a protective dressing such as a border foam be put on today for the next week and then following she can just leave this uncovered I do not want her to scrub it too hard in the shower. 2. I would recommend as well that we have the patient continue to monitor for any signs of worsening from the standpoint of infection and again she is in agreement with that plan obviously I do not expect any infection here since it is close but she will keep a close eye on things nonetheless. We will see patient back for reevaluation in 1 week here in the clinic. If anything worsens or changes patient will contact our office for additional recommendations. Electronic Signature(s) Signed: 03/09/2021 2:58:43 PM By: Worthy Keeler PA-C Entered By: Worthy Keeler on 03/09/2021 14:58:43 -------------------------------------------------------------------------------- SuperBill Details Patient Name: Date of Service: Leah Raring, [name] 03/09/2021 Medical Record Number: [id number] Patient Account Number:  [id number] Date of Birth/Sex: Treating RN: 1975-12-30 (45 y.o. Leah Barber Primary Care Provider: Dionisio David Other Clinician: Referring Provider: Treating Provider/Extender: Marcine Matar, Crystal Weeks in Treatment: 2 Diagnosis Coding ICD-10 Codes Code Description [phone] Unspecified open wound, right lower leg, initial encounter I87.2 Venous insufficiency (chronic) (peripheral) Facility Procedures CPT4 Code: [id number] Description: 99213 - WOUND CARE VISIT-LEV 3 EST PT Modifier: Quantity: 1 Physician Procedures : CPT4 Code Description Modifier [id number] - WC PHYS LEVEL 3 - EST PT ICD-10 Diagnosis Description S81.801A Unspecified open wound, right lower leg, initial encounter I87.2 Venous insufficiency (chronic) (peripheral) Quantity: 1 Electronic Signature(s) Signed: 03/09/2021 2:58:53 PM By: Worthy Keeler PA-C  Entered By: Worthy Keeler on 03/09/2021 14:58:52

## 2021-03-10 ENCOUNTER — Telehealth: Payer: Self-pay

## 2021-03-10 NOTE — Telephone Encounter (Signed)
Pt called and said to referral to different eye place she was treated awful and not respected

## 2021-03-10 NOTE — Progress Notes (Signed)
Signed: 03/09/2021 6:23:27 PM By: Baruch Gouty RN, BSN Entered By: Baruch Gouty on 03/09/2021 14:55:04 -------------------------------------------------------------------------------- Wound Assessment Details Patient Name: Date of Service: Leah Barber, Leah Barber. 03/09/2021 1:15 PM Medical Record Number: [id number] Patient Account Number: [id number] Date of Birth/Sex: Treating RN: 12-18-1975 (45 y.o. Leah Barber Primary Care Elouise Divelbiss: Dionisio David Other Clinician: Referring Gurney Balthazor: Treating Shigeru Lampert/Extender: Marcine Matar, Crystal Weeks in Treatment: 2 Wound Status Wound Number: 1 Primary Abscess Etiology: Wound Location: Right, Medial Lower Leg Wound Healed - Epithelialized Wounding Event: Other Lesion Status: Date Acquired: 01/10/2021 Comorbid Glaucoma, Anemia, Asthma, Hypertension, Type II Diabetes, Weeks Of Treatment: 2 History: Osteoarthritis, Neuropathy Clustered Wound: No Wound Measurements Length: (cm) Width: (cm) Depth: (cm) Area: (cm) Volume: (cm) 0 % Reduction in Area: 100% 0 % Reduction in Volume: 100% 0 Epithelialization: Small (1-33%) 0 Tunneling: No 0 Undermining: No Wound Description Classification: Full Thickness Without Exposed  Support Structures Wound Margin: Distinct, outline attached Exudate Amount: Medium Exudate Type: Serosanguineous Exudate Color: red, [place] Foul Odor After Cleansing: No Slough/Fibrino No Wound Bed Granulation Amount: Large (67-100%) Exposed Structure Granulation Quality: Pink, Pale Fascia Exposed: No Necrotic Amount: None Present (0%) Fat Layer (Subcutaneous Tissue) Exposed: Yes Tendon Exposed: No Muscle Exposed: No Joint Exposed: No Bone Exposed: No Electronic Signature(s) Signed: 03/09/2021 6:23:27 PM By: Baruch Gouty RN, BSN Entered By: Baruch Gouty on 03/09/2021 14:57:44 -------------------------------------------------------------------------------- Vitals Details Patient Name: Date of Service: Leah Barber, [name] 03/09/2021 1:15 PM Medical Record Number: [id number] Patient Account Number: [id number] Date of Birth/Sex: Treating RN: 08/02/1976 (45 y.o. Leah Barber Primary Care Shritha Bresee: Dionisio David Other Clinician: Referring Haniyah Maciolek: Treating Brooks Stotz/Extender: Marcine Matar, Crystal Weeks in Treatment: 2 Vital Signs Time Taken: 13:53 Temperature (F): 98.3 Height (in): 62 Pulse (bpm): 103 Weight (lbs): 208 Respiratory Rate (breaths/min): 18 Body Mass Index (BMI): 38 Blood Pressure (mmHg): 130/92 Reference Range: 80 - 120 mg / dl Electronic Signature(s) Signed: 03/09/2021 5:31:35 PM By: Lorrin Jackson Entered By: Lorrin Jackson on 03/09/2021 13:53:39  Leah Barber, Leah Barber ([id number]) Visit Report for 03/09/2021 Arrival Information Details Patient Name: Date of Service: Leah Barber, Leah Barber 03/09/2021 1:15 PM Medical Record Number: [id number] Patient Account Number: [id number] Date of Birth/Sex: Treating RN: 1975/11/08 (45 y.o. Leah Barber Primary Care Brevin Mcfadden: Dionisio David Other Clinician: Referring Jeanluc Wegman: Treating Letita Prentiss/Extender: Marcine Matar, Crystal Weeks in Treatment: 2 Visit Information History Since Last Visit Added or deleted any medications: No Patient Arrived: Ambulatory Any new allergies or adverse reactions: No Arrival Time: 13:42 Had a fall or experienced change in No Transfer Assistance: None activities of daily living that may affect Patient Identification Verified: Yes risk of falls: Secondary Verification Process Completed: Yes Signs or symptoms of abuse/neglect since last visito No Patient Requires Transmission-Based Precautions: No Hospitalized since last visit: No Patient Has Alerts: Yes Implantable device outside of the clinic excluding No Patient Alerts: L ABI=[place] cellular tissue based products placed in the center since last visit: Has Dressing in Place as Prescribed: Yes Pain Present Now: No Electronic Signature(s) Signed: 03/09/2021 5:31:35 PM By: Lorrin Jackson Entered By: Lorrin Jackson on 03/09/2021 13:53:14 -------------------------------------------------------------------------------- Clinic Level of Care Assessment Details Patient Name: Date of Service: Leah Barber, Leah Barber 03/09/2021 1:15 PM Medical Record Number: [id number] Patient Account Number: [id number] Date of Birth/Sex: Treating RN: 1976-07-05 (45 y.o. Leah Barber Primary Care Eula Mazzola: Dionisio David Other Clinician: Referring Flem Enderle: Treating Kairi Tufo/Extender: Marcine Matar, Crystal Weeks in Treatment: 2 Clinic Level of Care Assessment Items TOOL 4 Quantity Score []  - 0 Use when only an EandM is  performed on FOLLOW-UP visit ASSESSMENTS - Nursing Assessment / Reassessment X- 1 10 Reassessment of Co-morbidities (includes updates in patient status) X- 1 5 Reassessment of Adherence to Treatment Plan ASSESSMENTS - Wound and Skin A ssessment / Reassessment X - Simple Wound Assessment / Reassessment - one wound 1 5 []  - 0 Complex Wound Assessment / Reassessment - multiple wounds []  - 0 Dermatologic / Skin Assessment (not related to wound area) ASSESSMENTS - Focused Assessment X- 1 5 Circumferential Edema Measurements - multi extremities []  - 0 Nutritional Assessment / Counseling / Intervention X- 1 5 Lower Extremity Assessment (monofilament, tuning fork, pulses) []  - 0 Peripheral Arterial Disease Assessment (using hand held doppler) ASSESSMENTS - Ostomy and/or Continence Assessment and Care []  - 0 Incontinence Assessment and Management []  - 0 Ostomy Care Assessment and Management (repouching, etc.) PROCESS - Coordination of Care X - Simple Patient / Family Education for ongoing care 1 15 []  - 0 Complex (extensive) Patient / Family Education for ongoing care X- 1 10 Staff obtains Programmer, systems, Records, T Results / Process Orders est []  - 0 Staff telephones HHA, Nursing Homes / Clarify orders / etc []  - 0 Routine Transfer to another Facility (non-emergent condition) []  - 0 Routine Hospital Admission (non-emergent condition) []  - 0 New Admissions / Biomedical engineer / Ordering NPWT Apligraf, etc. , []  - 0 Emergency Hospital Admission (emergent condition) X- 1 10 Simple Discharge Coordination []  - 0 Complex (extensive) Discharge Coordination PROCESS - Special Needs []  - 0 Pediatric / Minor Patient Management []  - 0 Isolation Patient Management []  - 0 Hearing / Language / Visual special needs []  - 0 Assessment of Community assistance (transportation, D/C planning, etc.) []  - 0 Additional assistance / Altered mentation []  - 0 Support Surface(s) Assessment  (bed, cushion, seat, etc.) INTERVENTIONS - Wound Cleansing / Measurement X - Simple Wound Cleansing - one wound 1 5 []  - 0 Complex Wound Cleansing - multiple  Leah Barber, Leah Barber ([id number]) Visit Report for 03/09/2021 Arrival Information Details Patient Name: Date of Service: Leah Barber, Leah Barber 03/09/2021 1:15 PM Medical Record Number: [id number] Patient Account Number: [id number] Date of Birth/Sex: Treating RN: 1975/11/08 (45 y.o. Leah Barber Primary Care Brevin Mcfadden: Dionisio David Other Clinician: Referring Jeanluc Wegman: Treating Letita Prentiss/Extender: Marcine Matar, Crystal Weeks in Treatment: 2 Visit Information History Since Last Visit Added or deleted any medications: No Patient Arrived: Ambulatory Any new allergies or adverse reactions: No Arrival Time: 13:42 Had a fall or experienced change in No Transfer Assistance: None activities of daily living that may affect Patient Identification Verified: Yes risk of falls: Secondary Verification Process Completed: Yes Signs or symptoms of abuse/neglect since last visito No Patient Requires Transmission-Based Precautions: No Hospitalized since last visit: No Patient Has Alerts: Yes Implantable device outside of the clinic excluding No Patient Alerts: L ABI=[place] cellular tissue based products placed in the center since last visit: Has Dressing in Place as Prescribed: Yes Pain Present Now: No Electronic Signature(s) Signed: 03/09/2021 5:31:35 PM By: Lorrin Jackson Entered By: Lorrin Jackson on 03/09/2021 13:53:14 -------------------------------------------------------------------------------- Clinic Level of Care Assessment Details Patient Name: Date of Service: Leah Barber, Leah Barber 03/09/2021 1:15 PM Medical Record Number: [id number] Patient Account Number: [id number] Date of Birth/Sex: Treating RN: 1976-07-05 (45 y.o. Leah Barber Primary Care Eula Mazzola: Dionisio David Other Clinician: Referring Flem Enderle: Treating Kairi Tufo/Extender: Marcine Matar, Crystal Weeks in Treatment: 2 Clinic Level of Care Assessment Items TOOL 4 Quantity Score []  - 0 Use when only an EandM is  performed on FOLLOW-UP visit ASSESSMENTS - Nursing Assessment / Reassessment X- 1 10 Reassessment of Co-morbidities (includes updates in patient status) X- 1 5 Reassessment of Adherence to Treatment Plan ASSESSMENTS - Wound and Skin A ssessment / Reassessment X - Simple Wound Assessment / Reassessment - one wound 1 5 []  - 0 Complex Wound Assessment / Reassessment - multiple wounds []  - 0 Dermatologic / Skin Assessment (not related to wound area) ASSESSMENTS - Focused Assessment X- 1 5 Circumferential Edema Measurements - multi extremities []  - 0 Nutritional Assessment / Counseling / Intervention X- 1 5 Lower Extremity Assessment (monofilament, tuning fork, pulses) []  - 0 Peripheral Arterial Disease Assessment (using hand held doppler) ASSESSMENTS - Ostomy and/or Continence Assessment and Care []  - 0 Incontinence Assessment and Management []  - 0 Ostomy Care Assessment and Management (repouching, etc.) PROCESS - Coordination of Care X - Simple Patient / Family Education for ongoing care 1 15 []  - 0 Complex (extensive) Patient / Family Education for ongoing care X- 1 10 Staff obtains Programmer, systems, Records, T Results / Process Orders est []  - 0 Staff telephones HHA, Nursing Homes / Clarify orders / etc []  - 0 Routine Transfer to another Facility (non-emergent condition) []  - 0 Routine Hospital Admission (non-emergent condition) []  - 0 New Admissions / Biomedical engineer / Ordering NPWT Apligraf, etc. , []  - 0 Emergency Hospital Admission (emergent condition) X- 1 10 Simple Discharge Coordination []  - 0 Complex (extensive) Discharge Coordination PROCESS - Special Needs []  - 0 Pediatric / Minor Patient Management []  - 0 Isolation Patient Management []  - 0 Hearing / Language / Visual special needs []  - 0 Assessment of Community assistance (transportation, D/C planning, etc.) []  - 0 Additional assistance / Altered mentation []  - 0 Support Surface(s) Assessment  (bed, cushion, seat, etc.) INTERVENTIONS - Wound Cleansing / Measurement X - Simple Wound Cleansing - one wound 1 5 []  - 0 Complex Wound Cleansing - multiple

## 2021-03-15 NOTE — Telephone Encounter (Signed)
Notified patient, she has an appointment on Friday 7/29 at our office.

## 2021-03-17 ENCOUNTER — Ambulatory Visit: Payer: Medicare HMO | Admitting: Cardiovascular Disease

## 2021-03-17 NOTE — Progress Notes (Deleted)
No chief complaint on file.  History of Present Illness: 45 yo female with history of tobacco abuse, anxiety, depression, DM, GERD, HTN, HLD and pseudotumor cerebri who is here today as a new patient for the evaluation of lower extremity edema.   Primary Care Physician: [name], NP   Past Medical History:  Diagnosis Date   Allergy    Anemia    Anxiety    Arthritis    Asthma    Chronic lower back pain    Depression    Diabetes mellitus without complication (HCC)    Dyspnea    GERD (gastroesophageal reflux disease)    Glaucoma    Hyperlipemia    Hypertension    IIH (idiopathic intracranial hypertension)    Pseudotumor cerebri 08/13/2019   Substance abuse ([place])    Rx drugs for pain medication. Has not had in 5 years.    Past Surgical History:  Procedure Laterality Date   CESAREAN SECTION     x 3   CYSTOSCOPY Bilateral 11/25/2019   Procedure: CYSTOSCOPY;  Surgeon: Lavonia Drafts, MD;  Location: [facility];  Service: Gynecology;  Laterality: Bilateral;   DERMOID CYST  EXCISION     fluid removed from brain  12/20/2018   ROBOTIC ASSISTED TOTAL HYSTERECTOMY Bilateral 11/25/2019   Procedure: XI ROBOTIC ASSISTED TOTAL HYSTERECTOMY WITH SALPINGECTOMY;  Surgeon: Lavonia Drafts, MD;  Location: [facility];  Service: Gynecology;  Laterality: Bilateral;    Current Outpatient Medications  Medication Sig Dispense Refill   Albuterol Sulfate (PROAIR RESPICLICK) 123XX123 (90 Base) MCG/ACT AEPB Inhale 2 puffs into the lungs 4 (four) times daily as needed. 1 each 5   amLODipine (NORVASC) 10 MG tablet Take 1 tablet (10 mg total) by mouth daily. 90 tablet 3   buprenorphine (SUBUTEX) 8 MG SUBL SL tablet Place 8 mg under the tongue every 8 (eight) hours.     celecoxib (CELEBREX) 200 MG capsule TAKE 1 CAPSULE BY MOUTH TWICE A DAY 30 capsule 0   cetirizine (ZYRTEC) 10 MG tablet Take 1 tablet (10 mg total) by mouth daily. 90 tablet 3    clonazePAM (KLONOPIN) 0.5 MG tablet      cloNIDine (CATAPRES) 0.1 MG tablet TAKE 1 TABLET BY MOUTH 3 TIMES DAILY. [id number] tablet 3   CONCERTA 18 MG CR tablet Take 18 mg by mouth daily as needed.     CONCERTA 36 MG CR tablet Take 36 mg by mouth daily as needed (ADHD).      docusate sodium (COLACE) 100 MG capsule Take 100 mg by mouth 2 (two) times daily.     DULoxetine (CYMBALTA) 60 MG capsule TAKE 1 CAPSULE (60 MG TOTAL) BY MOUTH 2 (TWO) TIMES DAILY. 180 capsule 2   FLUoxetine (PROZAC) 20 MG capsule TAKE 3 CAPSULES BY MOUTH EVERY MORNING 270 capsule 1   FLUoxetine (PROZAC) 40 MG capsule Take 1 capsule (40 mg total) by mouth every morning. 90 capsule 0   fluticasone (FLONASE) 50 MCG/ACT nasal spray USE 2 SPRAYS IN EACH NOSTRIL DAILY (Patient taking differently: Place 2 sprays into both nostrils daily. USE 2 SPRAYS IN EACH NOSTRIL DAILY) 48 mL 1   furosemide (LASIX) 20 MG tablet Take 1 tablet (20 mg total) by mouth daily. 30 tablet 0   gabapentin (NEURONTIN) 400 MG capsule Take 2 capsules (800 mg total) by mouth 3 (three) times daily. 180 capsule 11   hydrOXYzine (VISTARIL) 50 MG capsule Take 50 mg by mouth 4 (four) times  daily as needed for anxiety.      ibuprofen (ADVIL) 600 MG tablet 1 TABLET BY MOUTH EVERY 6HRS, NOT TO EXCEED '3200MG'$  PER DAY.     ibuprofen (ADVIL) 800 MG tablet Take 1 tablet (800 mg total) by mouth every 8 (eight) hours as needed. 30 tablet 5   latanoprost (XALATAN) 0.005 % ophthalmic solution Place 1 drop into both eyes at bedtime. 2.5 mL 0   metFORMIN (GLUCOPHAGE) 500 MG tablet Take 1 tablet (500 mg total) by mouth 2 (two) times daily with a meal. 180 tablet 3   Multiple Vitamins-Minerals (WOMENS MULTIVITAMIN PO) Take 1 tablet by mouth daily.      omeprazole (PRILOSEC) 40 MG capsule Take 1 capsule (40 mg total) by mouth daily. 90 capsule 3   ondansetron (ZOFRAN) 4 MG tablet TAKE 1 TABLET (4 MG TOTAL) BY MOUTH EVERY 6 (SIX) HOURS AS NEEDED FOR NAUSEA OR VOMITING 60 tablet 2    potassium chloride SA (KLOR-CON) 20 MEQ tablet Take 1 tablet (20 mEq total) by mouth 2 (two) times daily for 10 days. 20 tablet 0   rosuvastatin (CRESTOR) 5 MG tablet TAKE 1 TABLET BY MOUTH EVERY DAY 90 tablet 3   spironolactone (ALDACTONE) 25 MG tablet Take 1 tablet (25 mg total) by mouth daily for 7 days. 7 tablet 0   zolpidem (AMBIEN) 5 MG tablet Take 1 tablet (5 mg total) by mouth at bedtime as needed for up to 5 days for sleep. 5 tablet 0   No current facility-administered medications for this visit.    Allergies  Allergen Reactions   Clindamycin/Lincomycin Nausea Only   Other Other (See Comments)    REFUSED BLOOD TRANSFUSION AND BLOOD PRODUCTS   Hydrocodone Nausea And Vomiting and Other (See Comments)    stomach pain    Morphine Nausea And Vomiting    Social History   Socioeconomic History   Marital status: Legally Separated    Spouse name: Not on file   Number of children: Not on file   Years of education: Not on file   Highest education level: Not on file  Occupational History   Not on file  Tobacco Use   Smoking status: Every Day    Packs/day: 1.00    Types: Cigarettes   Smokeless tobacco: Never  Vaping Use   Vaping Use: Never used  Substance and Sexual Activity   Alcohol use: Yes    Comment: occasional   Drug use: No   Sexual activity: Not Currently  Other Topics Concern   Not on file  Social History Narrative   Not on file   Social Determinants of Health   Financial Resource Strain: High Risk   Difficulty of Paying Living Expenses: Hard  Food Insecurity: Food Insecurity Present   Worried About Running Out of Food in the Last Year: Sometimes true   Ran Out of Food in the Last Year: Sometimes true  Transportation Needs: Unmet Transportation Needs   Lack of Transportation (Medical): Yes   Lack of Transportation (Non-Medical): Not on file  Physical Activity: Insufficiently Active   Days of Exercise per Week: 7 days   Minutes of Exercise per Session: 20  min  Stress: Stress Concern Present   Feeling of Stress : To some extent  Social Connections: Socially Isolated   Frequency of Communication with Friends and Family: More than three times a week   Frequency of Social Gatherings with Friends and Family: More than three times a week   Attends Religious Services:  Never   Active Member of Clubs or Organizations: No   Attends Archivist Meetings: Never   Marital Status: Divorced  Human resources officer Violence: Not At Risk   Fear of Current or Ex-Partner: No   Emotionally Abused: No   Physically Abused: No   Sexually Abused: No    Family History  Problem Relation Age of Onset   Diabetes Mother    Hypertension Mother    Cancer Father    Liver cancer Father    Colon cancer Neg Hx    Esophageal cancer Neg Hx    Rectal cancer Neg Hx    Stomach cancer Neg Hx    Pancreatic cancer Neg Hx     Review of Systems:  As stated in the HPI and otherwise negative.   LMP 09/14/2019 (Approximate)   Physical Examination: General: Well developed, well nourished, NAD  HEENT: OP clear, mucus membranes moist  SKIN: warm, dry. No rashes. Neuro: No focal deficits  Musculoskeletal: Muscle strength 5/5 all ext  Psychiatric: Mood and affect normal  Neck: No JVD, no carotid bruits, no thyromegaly, no lymphadenopathy.  Lungs:Clear bilaterally, no wheezes, rhonci, crackles Cardiovascular: Regular rate and rhythm. No murmurs, gallops or rubs. Abdomen:Soft. Bowel sounds present. Non-tender.  Extremities: No lower extremity edema. Pulses are 2 + in the bilateral DP/PT.  EKG:  EKG {ACTION; IS/IS [id number] ordered today. The ekg ordered today demonstrates ***  Recent Labs: 01/17/2021: ALT 23; B Natriuretic Peptide 9.6; BUN 12; Creatinine, Ser 0.88; Hemoglobin 12.6; Platelets 186; Potassium 3.2; Sodium 143   Lipid Panel    Component Value Date/Time   CHOL 275 (H) 05/17/2020 1232   TRIG 134 05/17/2020 1232   HDL 49 05/17/2020 1232   CHOLHDL  5.6 (H) 05/17/2020 1232   CHOLHDL 4.6 11/23/2016 1043   VLDL 18 11/23/2016 1043   LDLCALC 201 (H) 05/17/2020 1232     Wt Readings from Last 3 Encounters:  02/03/21 210 lb 0.4 oz (95.3 kg)  01/17/21 214 lb 15.2 oz (97.5 kg)  01/11/21 211 lb (95.7 kg)     Other studies Reviewed: Additional studies/ records that were reviewed today include: ***. Review of the above records demonstrates: ***   Assessment and Plan:   1.   Current medicines are reviewed at length with the patient today.  The patient {ACTIONS; HAS/DOES NOT HAVE:19233} concerns regarding medicines.  The following changes have been made:  {PLAN; NO CHANGE:13088:s}  Labs/ tests ordered today include: *** No orders of the defined types were placed in this encounter.    Disposition:   FU with *** in {gen number [id number] {TIME; UNITS DAY/WEEK/MONTH:19136}   Signed, Lauree Chandler, MD 03/17/2021 5:53 AM    [facility] [place], [place], [place]  [address] Phone: [phone]; Fax: [phone]

## 2021-03-18 ENCOUNTER — Other Ambulatory Visit: Payer: Self-pay

## 2021-03-18 ENCOUNTER — Ambulatory Visit (INDEPENDENT_AMBULATORY_CARE_PROVIDER_SITE_OTHER): Payer: Medicare HMO | Admitting: Nurse Practitioner

## 2021-03-18 ENCOUNTER — Encounter: Payer: Self-pay | Admitting: Nurse Practitioner

## 2021-03-18 VITALS — BP 131/90 | HR 91 | Temp 97.0°F | Ht 64.0 in | Wt 209.0 lb

## 2021-03-18 DIAGNOSIS — R600 Localized edema: Secondary | ICD-10-CM | POA: Diagnosis not present

## 2021-03-18 DIAGNOSIS — E119 Type 2 diabetes mellitus without complications: Secondary | ICD-10-CM

## 2021-03-18 DIAGNOSIS — E876 Hypokalemia: Secondary | ICD-10-CM | POA: Diagnosis not present

## 2021-03-18 DIAGNOSIS — I10 Essential (primary) hypertension: Secondary | ICD-10-CM

## 2021-03-18 DIAGNOSIS — Z9109 Other allergy status, other than to drugs and biological substances: Secondary | ICD-10-CM

## 2021-03-18 LAB — POCT GLYCOSYLATED HEMOGLOBIN (HGB A1C)
HbA1c POC (<> result, manual entry): 6.6 % (ref 4.0–5.6)
HbA1c, POC (controlled diabetic range): 6.6 % (ref 0.0–7.0)
HbA1c, POC (prediabetic range): 6.6 % — AB (ref 5.7–6.4)
Hemoglobin A1C: 6.6 % — AB (ref 4.0–5.6)

## 2021-03-18 LAB — POCT URINALYSIS DIPSTICK
Bilirubin, UA: NEGATIVE
Blood, UA: NEGATIVE
Glucose, UA: NEGATIVE
Ketones, UA: NEGATIVE
Leukocytes, UA: NEGATIVE
Nitrite, UA: NEGATIVE
Protein, UA: NEGATIVE
Spec Grav, UA: 1.02 (ref 1.010–1.025)
Urobilinogen, UA: 0.2 E.U./dL
pH, UA: 5.5 (ref 5.0–8.0)

## 2021-03-18 LAB — GLUCOSE, POCT (MANUAL RESULT ENTRY): POC Glucose: 101 mg/dl — AB (ref 70–99)

## 2021-03-18 MED ORDER — FLUTICASONE PROPIONATE 50 MCG/ACT NA SUSP: 2.0000 | Freq: Every day | NASAL | 1 refills | Status: DC
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
## 2021-03-18 NOTE — Patient Instructions (Signed)
Edema  Edema is when you have too much fluid in your body or under your skin. Edema may make your legs, feet, and ankles swell up. Swelling is also common in looser tissues, like around your eyes. This is a common condition. It gets more common as you get older. There are many possible causes of edema. Eating too much salt (sodium) and being on your feet or sitting for a long time can cause edema in yourlegs, feet, and ankles. Hot weather may make edema worse. Edema is usually painless. Your skin may look swollen or shiny. Follow these instructions at home: Keep the swollen body part raised (elevated) above the level of your heart when you are sitting or lying down. Do not sit still or stand for a long time. Do not wear tight clothes. Do not wear garters on your upper legs. Exercise your legs. This can help the swelling go down. Wear elastic bandages or support stockings as told by your doctor. Eat a low-salt (low-sodium) diet to reduce fluid as told by your doctor. Depending on the cause of your swelling, you may need to limit how much fluid you drink (fluid restriction). Take over-the-counter and prescription medicines only as told by your doctor. Contact a doctor if: Treatment is not working. You have heart, liver, or kidney disease and have symptoms of edema. You have sudden and unexplained weight gain. Get help right away if: You have shortness of breath or chest pain. You cannot breathe when you lie down. You have pain, redness, or warmth in the swollen areas. You have heart, liver, or kidney disease and get edema all of a sudden. You have a fever and your symptoms get worse all of a sudden. Summary Edema is when you have too much fluid in your body or under your skin. Edema may make your legs, feet, and ankles swell up. Swelling is also common in looser tissues, like around your eyes. Raise (elevate) the swollen body part above the level of your heart when you are sitting or lying  down. Follow your doctor's instructions about diet and how much fluid you can drink (fluid restriction). This information is not intended to replace advice given to you by your health care provider. Make sure you discuss any questions you have with your healthcare provider. Document Revised: 06/02/2020 Document Reviewed: 06/02/2020 Elsevier Patient Education  2022 Elsevier Inc.  

## 2021-03-19 LAB — COMP. METABOLIC PANEL (12)
AST: 18 IU/L (ref 0–40)
Albumin/Globulin Ratio: 1.7 (ref 1.2–2.2)
Albumin: 4.7 g/dL (ref 3.8–4.8)
Alkaline Phosphatase: 137 IU/L — ABNORMAL HIGH (ref 44–121)
BUN/Creatinine Ratio: 13 (ref 9–23)
BUN: 11 mg/dL (ref 6–24)
Bilirubin Total: 0.3 mg/dL (ref 0.0–1.2)
Calcium: 10 mg/dL (ref 8.7–10.2)
Chloride: 103 mmol/L (ref 96–106)
Creatinine, Ser: 0.85 mg/dL (ref 0.57–1.00)
Globulin, Total: 2.8 g/dL (ref 1.5–4.5)
Glucose: 84 mg/dL (ref 65–99)
Potassium: 3.9 mmol/L (ref 3.5–5.2)
Sodium: 143 mmol/L (ref 134–144)
Total Protein: 7.5 g/dL (ref 6.0–8.5)
eGFR: 85 mL/min/{1.73_m2} (ref >59)

## 2021-03-23 ENCOUNTER — Other Ambulatory Visit: Payer: Self-pay | Admitting: Nurse Practitioner

## 2021-03-23 MED ORDER — GABAPENTIN 800 MG PO TABS: 800.0000 mg | ORAL_TABLET | Freq: Three times a day (TID) | ORAL | 3 refills | Status: DC

## 2021-03-23 NOTE — Progress Notes (Signed)
    [facility] [address] [place], [place]  [address] Phone:  [phone]   Fax:  [phone] 

## 2021-03-24 ENCOUNTER — Telehealth: Payer: Self-pay | Admitting: Nurse Practitioner

## 2021-03-24 NOTE — Telephone Encounter (Signed)
Pt expresses frustration w/ gabapentin change made without her knowing, she states. Pt says she received a call from her heart Doctor, she believes it was and told her her Gabapentin was changed and she says she didn't know. Pt would like to speak w/ provider concerning dosage and why her medication was changed without knowing. Pleae advise and thank you

## 2021-03-24 NOTE — Telephone Encounter (Signed)
Please see what it is she is questioning.  This was an insurance issue ?

## 2021-03-25 NOTE — Telephone Encounter (Signed)
Left message to call back  

## 2021-03-29 ENCOUNTER — Other Ambulatory Visit: Payer: Self-pay | Admitting: Nurse Practitioner

## 2021-03-29 ENCOUNTER — Telehealth (HOSPITAL_COMMUNITY): Payer: Self-pay | Admitting: Nurse Practitioner

## 2021-03-29 NOTE — Telephone Encounter (Signed)
ibuprofen (ADVIL) 800 MG tablet [id number]   Pt states she's completely out   Pharmacy  [id number] [id number] Phone:  [phone] Fax:  [phone]

## 2021-03-30 ENCOUNTER — Telehealth: Payer: Self-pay

## 2021-03-30 NOTE — Telephone Encounter (Signed)
Medication refilled

## 2021-03-30 NOTE — Telephone Encounter (Signed)
error 

## 2021-04-09 ENCOUNTER — Other Ambulatory Visit: Payer: Self-pay | Admitting: Nurse Practitioner

## 2021-04-09 DIAGNOSIS — R110 Nausea: Secondary | ICD-10-CM

## 2021-04-11 ENCOUNTER — Other Ambulatory Visit: Payer: Self-pay | Admitting: Nurse Practitioner

## 2021-04-26 ENCOUNTER — Other Ambulatory Visit: Payer: Self-pay | Admitting: Nurse Practitioner

## 2021-05-01 ENCOUNTER — Other Ambulatory Visit: Payer: Self-pay | Admitting: Nurse Practitioner

## 2021-05-02 ENCOUNTER — Ambulatory Visit: Payer: Medicare HMO | Admitting: Nurse Practitioner

## 2021-05-14 ENCOUNTER — Other Ambulatory Visit: Payer: Self-pay | Admitting: Nurse Practitioner

## 2021-05-16 ENCOUNTER — Other Ambulatory Visit: Payer: Self-pay | Admitting: Nurse Practitioner

## 2021-05-17 ENCOUNTER — Ambulatory Visit: Payer: Medicaid Other | Admitting: Nurse Practitioner

## 2021-05-18 ENCOUNTER — Ambulatory Visit: Payer: Medicaid Other | Admitting: Nurse Practitioner

## 2021-05-23 ENCOUNTER — Other Ambulatory Visit: Payer: Self-pay | Admitting: Nurse Practitioner

## 2021-05-23 DIAGNOSIS — R110 Nausea: Secondary | ICD-10-CM

## 2021-05-25 ENCOUNTER — Telehealth: Payer: Self-pay

## 2021-05-25 DIAGNOSIS — I10 Essential (primary) hypertension: Secondary | ICD-10-CM

## 2021-05-25 MED ORDER — AMLODIPINE BESYLATE 10 MG PO TABS
10.0000 mg | ORAL_TABLET | Freq: Every day | ORAL | 3 refills | Status: DC
Start: 2021-05-25 — End: 2021-07-25

## 2021-05-25 MED ORDER — CLONIDINE HCL 0.1 MG PO TABS: 0.1000 mg | ORAL_TABLET | Freq: Three times a day (TID) | ORAL | 3 refills | Status: DC

## 2021-05-25 MED ORDER — FUROSEMIDE 20 MG PO TABS
20.0000 mg | ORAL_TABLET | Freq: Every day | ORAL | 0 refills | Status: DC
Start: 2021-05-25 — End: 2021-07-20

## 2021-05-25 NOTE — Telephone Encounter (Signed)
Refills sent

## 2021-05-25 NOTE — Telephone Encounter (Signed)
Bp med and fluid pills

## 2021-05-30 ENCOUNTER — Ambulatory Visit: Payer: Medicaid Other | Admitting: Nurse Practitioner

## 2021-06-06 ENCOUNTER — Telehealth: Payer: Self-pay

## 2021-06-06 ENCOUNTER — Other Ambulatory Visit: Payer: Self-pay | Admitting: Internal Medicine

## 2021-06-06 NOTE — Telephone Encounter (Signed)
Ibuprofen

## 2021-06-06 NOTE — Telephone Encounter (Signed)
Spoke to the patient to clarify her question about her Ibuprofen. Pt was needing a refill on her Ibuprofen and her Cetirizine. I advised the patient that she already had refills at her pharmacy for both medication and she needed to check with her pharmacy. Pt verbalized understanding.

## 2021-06-08 ENCOUNTER — Ambulatory Visit: Payer: Medicaid Other | Admitting: Nurse Practitioner

## 2021-06-14 ENCOUNTER — Telehealth: Payer: Self-pay

## 2021-06-14 NOTE — Telephone Encounter (Signed)
Ibuprofen 800  [facility] [address]

## 2021-06-15 ENCOUNTER — Ambulatory Visit: Payer: Medicaid Other | Admitting: Nurse Practitioner

## 2021-06-20 ENCOUNTER — Encounter: Payer: Self-pay | Admitting: Nurse Practitioner

## 2021-06-20 ENCOUNTER — Other Ambulatory Visit: Payer: Self-pay | Admitting: Nurse Practitioner

## 2021-06-20 ENCOUNTER — Other Ambulatory Visit: Payer: Self-pay

## 2021-06-20 ENCOUNTER — Ambulatory Visit (INDEPENDENT_AMBULATORY_CARE_PROVIDER_SITE_OTHER): Payer: Medicare Other | Admitting: Nurse Practitioner

## 2021-06-20 VITALS — BP 113/74 | HR 84 | Temp 97.2°F | Ht 63.0 in | Wt 199.4 lb

## 2021-06-20 DIAGNOSIS — M25531 Pain in right wrist: Secondary | ICD-10-CM | POA: Diagnosis not present

## 2021-06-20 DIAGNOSIS — G8929 Other chronic pain: Secondary | ICD-10-CM

## 2021-06-20 DIAGNOSIS — Z0000 Encounter for general adult medical examination without abnormal findings: Secondary | ICD-10-CM | POA: Diagnosis not present

## 2021-06-20 DIAGNOSIS — M545 Low back pain, unspecified: Secondary | ICD-10-CM | POA: Diagnosis not present

## 2021-06-20 MED ORDER — LIDOCAINE 4 % EX PTCH: 1.0000 | MEDICATED_PATCH | Freq: Every day | CUTANEOUS | 0 refills | Status: DC

## 2021-06-20 NOTE — Patient Instructions (Signed)
You were seen today in the [facility] for right wrist pain and medication refill. Labs were collected, results will be available via MyChart or, if abnormal, you will be contacted by clinic staff. You were prescribed medications, please take as directed. Referral was sent to orthopedics. Please maintain upcoming follow up with PCP.

## 2021-06-20 NOTE — Progress Notes (Signed)
[place], [place]  [address] Phone:  [phone]   Fax:  [phone] Subjective:   Patient ID: Leah Barber, female    DOB: Nov 24, 1975, 45 y.o.   MRN: [id number]  Chief Complaint  Patient presents with   Follow-up    Pt is here today for her follow up visit. Pt is states that she has a knot on her right wrist and it is very painful to move it.   HPI Leah Barber 45 y.o. female  has a past medical history of Allergy, Anemia, Anxiety, Arthritis, Asthma, Chronic lower back pain, Depression, Diabetes mellitus without complication ([place]), Dyspnea, GERD (gastroesophageal reflux disease), Glaucoma, Hyperlipemia, Hypertension, IIH (idiopathic intracranial hypertension), Pseudotumor cerebri (08/13/2019), and Substance abuse ([place]). To the [facility] for right wrist pain. Patient states that she has had right wrist pain  and swelling x 4 wks. Has been taking ibuprofen for pain with mild improvement. Currently rates pain 9/10 and describes as burning. Wrist pain worsens with palpation and movement. Denies any recent trauma or injury. Has not been previously evaluated for pain. Also endorsing worsening chronic pain in the lower back, that radiates to the BLE. Patient endorses some improvement when laying in the supine position, but states that is no longer as effective as it was in the past at improving pain. States," It's my sciatica and I can't take it no more."  Patient also requesting refill of medications. Denies any other complaints today. Denies any fatigue, chest pain, shortness of breath, HA or dizziness. Denies any blurred vision, numbness or tingling.   Past Medical History:  Diagnosis Date   Allergy    Anemia    Anxiety    Arthritis    Asthma    Chronic lower back pain    Depression    Diabetes mellitus without complication (HCC)    Dyspnea    GERD (gastroesophageal reflux disease)    Glaucoma    Hyperlipemia    Hypertension    IIH  (idiopathic intracranial hypertension)    Pseudotumor cerebri 08/13/2019   Substance abuse ([place])    Rx drugs for pain medication. Has not had in 5 years.    Past Surgical History:  Procedure Laterality Date   CESAREAN SECTION     x 3   CYSTOSCOPY Bilateral 11/25/2019   Procedure: CYSTOSCOPY;  Surgeon: Lavonia Drafts, MD;  Location: [facility];  Service: Gynecology;  Laterality: Bilateral;   DERMOID CYST  EXCISION     fluid removed from brain  12/20/2018   ROBOTIC ASSISTED TOTAL HYSTERECTOMY Bilateral 11/25/2019   Procedure: XI ROBOTIC ASSISTED TOTAL HYSTERECTOMY WITH SALPINGECTOMY;  Surgeon: Lavonia Drafts, MD;  Location: [facility];  Service: Gynecology;  Laterality: Bilateral;    Family History  Problem Relation Age of Onset   Diabetes Mother    Hypertension Mother    Cancer Father    Liver cancer Father    Colon cancer Neg Hx    Esophageal cancer Neg Hx    Rectal cancer Neg Hx    Stomach cancer Neg Hx    Pancreatic cancer Neg Hx     Social History   Socioeconomic History   Marital status: Legally Separated    Spouse name: Not on file   Number of children: Not on file   Years of education: Not on file   Highest education level: Not on file  Occupational History   Not on file  Tobacco Use  Smoking status: Every Day    Packs/day: 0.25    Types: Cigarettes   Smokeless tobacco: Never  Vaping Use   Vaping Use: Never used  Substance and Sexual Activity   Alcohol use: Yes    Comment: occasional   Drug use: No   Sexual activity: Yes    Birth control/protection: Condom  Other Topics Concern   Not on file  Social History Narrative   Not on file   Social Determinants of Health   Financial Resource Strain: High Risk   Difficulty of Paying Living Expenses: Hard  Food Insecurity: Food Insecurity Present   Worried About Running Out of Food in the Last Year: Sometimes true   Ran Out of Food in the Last Year: Sometimes  true  Transportation Needs: Unmet Transportation Needs   Lack of Transportation (Medical): Yes   Lack of Transportation (Non-Medical): Not on file  Physical Activity: Insufficiently Active   Days of Exercise per Week: 7 days   Minutes of Exercise per Session: 20 min  Stress: Stress Concern Present   Feeling of Stress : To some extent  Social Connections: Socially Isolated   Frequency of Communication with Friends and Family: More than three times a week   Frequency of Social Gatherings with Friends and Family: More than three times a week   Attends Religious Services: Never   Marine scientist or Organizations: No   Attends Music therapist: Never   Marital Status: Divorced  Human resources officer Violence: Not At Risk   Fear of Current or Ex-Partner: No   Emotionally Abused: No   Physically Abused: No   Sexually Abused: No    Outpatient Medications Prior to Visit  Medication Sig Dispense Refill   amLODipine (NORVASC) 10 MG tablet Take 1 tablet (10 mg total) by mouth daily. 90 tablet 3   buprenorphine (SUBUTEX) 8 MG SUBL SL tablet Place 8 mg under the tongue every 8 (eight) hours.     cetirizine (ZYRTEC) 10 MG tablet TAKE 1 TABLET BY MOUTH EVERY DAY 90 tablet 3   clonazePAM (KLONOPIN) 0.5 MG tablet      cloNIDine (CATAPRES) 0.1 MG tablet Take 1 tablet (0.1 mg total) by mouth 3 (three) times daily. 025 tablet 3   CONCERTA 18 MG CR tablet Take 18 mg by mouth daily as needed.     DULoxetine (CYMBALTA) 60 MG capsule TAKE 1 CAPSULE (60 MG TOTAL) BY MOUTH 2 (TWO) TIMES DAILY. 180 capsule 2   fluticasone (FLONASE) 50 MCG/ACT nasal spray Place 2 sprays into both nostrils daily. 48 mL 1   furosemide (LASIX) 20 MG tablet Take 1 tablet (20 mg total) by mouth daily. 30 tablet 0   gabapentin (NEURONTIN) 800 MG tablet Take 1 tablet (800 mg total) by mouth every 8 (eight) hours. 90 tablet 3   ibuprofen (ADVIL) 800 MG tablet TAKE 1 TABLET BY MOUTH EVERY 8 HOURS AS NEEDED 30 tablet 5    latanoprost (XALATAN) 0.005 % ophthalmic solution Place 1 drop into both eyes at bedtime. 2.5 mL 0   metFORMIN (GLUCOPHAGE) 500 MG tablet TAKE 1 TABLET (500 MG TOTAL) BY MOUTH 2 (TWO) TIMES DAILY WITH A MEAL. 180 tablet 3   Multiple Vitamins-Minerals (WOMENS MULTIVITAMIN PO) Take 1 tablet by mouth daily.      ondansetron (ZOFRAN) 4 MG tablet TAKE 1 TABLET (4 MG TOTAL) BY MOUTH EVERY 6 (SIX) HOURS AS NEEDED FOR NAUSEA OR VOMITING 60 tablet 2   rosuvastatin (CRESTOR) 5 MG tablet  TAKE 1 TABLET BY MOUTH EVERY DAY 90 tablet 3   Albuterol Sulfate (PROAIR RESPICLICK) 401 (90 Base) MCG/ACT AEPB Inhale 2 puffs into the lungs 4 (four) times daily as needed. 1 each 5   celecoxib (CELEBREX) 200 MG capsule TAKE 1 CAPSULE BY MOUTH TWICE A DAY (Patient not taking: Reported on 06/20/2021) 30 capsule 0   FLUoxetine (PROZAC) 20 MG capsule TAKE 3 CAPSULES BY MOUTH EVERY MORNING. (Patient not taking: Reported on 06/20/2021) 270 capsule 1   hydrOXYzine (VISTARIL) 50 MG capsule Take 50 mg by mouth 4 (four) times daily as needed for anxiety.  (Patient not taking: No sig reported)     omeprazole (PRILOSEC) 40 MG capsule Take 1 capsule (40 mg total) by mouth daily. 90 capsule 3   potassium chloride SA (KLOR-CON) 20 MEQ tablet Take 1 tablet (20 mEq total) by mouth 2 (two) times daily for 10 days. 20 tablet 0   spironolactone (ALDACTONE) 25 MG tablet Take 1 tablet (25 mg total) by mouth daily for 7 days. 7 tablet 0   zolpidem (AMBIEN) 5 MG tablet Take 1 tablet (5 mg total) by mouth at bedtime as needed for up to 5 days for sleep. 5 tablet 0   No facility-administered medications prior to visit.    Allergies  Allergen Reactions   Clindamycin/Lincomycin Nausea Only   Other Other (See Comments)    REFUSED BLOOD TRANSFUSION AND BLOOD PRODUCTS   Hydrocodone Nausea And Vomiting and Other (See Comments)    stomach pain    Morphine Nausea And Vomiting    Review of Systems  Constitutional:  Negative for chills, fever and  malaise/fatigue.  Respiratory:  Negative for cough and shortness of breath.   Cardiovascular:  Negative for chest pain, palpitations and leg swelling.  Gastrointestinal:  Negative for abdominal pain, blood in stool, constipation, diarrhea, nausea and vomiting.  Musculoskeletal:  Positive for back pain and joint pain.  Skin: Negative.   Neurological: Negative.   Psychiatric/Behavioral:  Negative for depression. The patient is not nervous/anxious.   All other systems reviewed and are negative.     Objective:    Physical Exam Vitals reviewed.  Constitutional:      General: She is not in acute distress.    Appearance: Normal appearance.  HENT:     Head: Normocephalic.  Cardiovascular:     Rate and Rhythm: Normal rate and regular rhythm.     Pulses: Normal pulses.     Heart sounds: Normal heart sounds.     Comments: No obvious peripheral edema Pulmonary:     Effort: Pulmonary effort is normal.     Breath sounds: Normal breath sounds.  Musculoskeletal:        General: No swelling, deformity or signs of injury. Normal range of motion.     Right lower leg: No edema.     Left lower leg: No edema.     Comments: Mild tenderness with palpation of the left lateral portion of the right wrist, no swelling or erythema noted. FROM intact. Mild tenderness with palpation of diffuse lower lumbar.  Skin:    General: Skin is warm and dry.     Capillary Refill: Capillary refill takes less than 2 seconds.  Neurological:     General: No focal deficit present.     Mental Status: She is alert and oriented to person, place, and time.  Psychiatric:        Mood and Affect: Mood normal.        Behavior:  Behavior normal.        Thought Content: Thought content normal.        Judgment: Judgment normal.    BP 113/74   Pulse 84   Temp (!) 97.2 F (36.2 C)   Ht 5' 3"  (1.6 m)   Wt 199 lb 6.4 oz (90.4 kg)   LMP 09/14/2019 (Approximate)   SpO2 96%   BMI 35.32 kg/m  Wt Readings from Last 3  Encounters:  06/20/21 199 lb 6.4 oz (90.4 kg)  03/18/21 209 lb 0.2 oz (94.8 kg)  02/03/21 210 lb 0.4 oz (95.3 kg)    Immunization History  Administered Date(s) Administered   Tdap 10/06/2019    Diabetic Foot Exam - Simple   No data filed     Lab Results  Component Value Date   TSH 1.610 12/24/2019   Lab Results  Component Value Date   WBC 7.5 01/17/2021   HGB 12.6 01/17/2021   HCT 37.0 01/17/2021   MCV 94.2 01/17/2021   PLT 186 01/17/2021   Lab Results  Component Value Date   NA 143 03/18/2021   K 3.9 03/18/2021   CO2 26 01/17/2021   GLUCOSE 84 03/18/2021   BUN 11 03/18/2021   CREATININE 0.85 03/18/2021   BILITOT 0.3 03/18/2021   ALKPHOS 137 (H) 03/18/2021   AST 18 03/18/2021   ALT 23 01/17/2021   PROT 7.5 03/18/2021   ALBUMIN 4.7 03/18/2021   CALCIUM 10.0 03/18/2021   ANIONGAP 8 01/17/2021   EGFR 85 03/18/2021   Lab Results  Component Value Date   CHOL 275 (H) 05/17/2020   CHOL 216 (H) 11/23/2016   Lab Results  Component Value Date   HDL 49 05/17/2020   HDL 47 (L) 11/23/2016   Lab Results  Component Value Date   LDLCALC 201 (H) 05/17/2020   LDLCALC 151 (H) 11/23/2016   Lab Results  Component Value Date   TRIG 134 05/17/2020   TRIG 92 11/23/2016   Lab Results  Component Value Date   CHOLHDL 5.6 (H) 05/17/2020   CHOLHDL 4.6 11/23/2016   Lab Results  Component Value Date   HGBA1C 6.6 (A) 03/18/2021   HGBA1C 6.6 03/18/2021   HGBA1C 6.6 (A) 03/18/2021   HGBA1C 6.6 03/18/2021       Assessment & Plan:   Problem List Items Addressed This Visit   None Visit Diagnoses     Right wrist pain    -  Primary   Relevant Orders   DG Wrist Complete Right   AMB referral to orthopedics Discussed non pharmacological methods for pain management    Healthcare maintenance       Relevant Orders   Lipid Panel Encouraged continued diet and exercise efforts  Encouraged continued compliance with medication  Patient medications up to date with no  refills orders required today, discussed this at length with patients   Chronic low back pain, unspecified back pain laterality, unspecified whether sciatica present       Relevant Medications   Lidocaine (HM LIDOCAINE PATCH) 4 % PTCH Discussed non pharmacological methods for pain management   Other Relevant Orders   AMB referral to orthopedics   Drug Screen, Urine   Maintain upcoming follow up with PCP, sooner as needed    I am having Takoya S. Husak start on Lidocaine. I am also having her maintain her Multiple Vitamins-Minerals (WOMENS MULTIVITAMIN PO), hydrOXYzine, zolpidem, buprenorphine, ProAir RespiClick, latanoprost, omeprazole, Concerta, spironolactone, DULoxetine, rosuvastatin, clonazePAM, potassium chloride SA, celecoxib, fluticasone, gabapentin,  ibuprofen, cetirizine, FLUoxetine, metFORMIN, ondansetron, amLODipine, cloNIDine, and furosemide.  Meds ordered this encounter  Medications   Lidocaine (HM LIDOCAINE PATCH) 4 % PTCH    Sig: Apply 1 patch topically daily.    Dispense:  14 patch    Refill:  0     Teena Dunk, NP

## 2021-06-21 ENCOUNTER — Other Ambulatory Visit: Payer: Self-pay

## 2021-06-21 DIAGNOSIS — M545 Low back pain, unspecified: Secondary | ICD-10-CM

## 2021-06-21 DIAGNOSIS — G8929 Other chronic pain: Secondary | ICD-10-CM

## 2021-06-21 LAB — LIPID PANEL
Chol/HDL Ratio: 4.1 ratio (ref 0.0–4.4)
Cholesterol, Total: 188 mg/dL (ref 100–199)
HDL: 46 mg/dL (ref >39)
LDL Chol Calc (NIH): 112 mg/dL — ABNORMAL HIGH (ref 0–99)
Triglycerides: 172 mg/dL — ABNORMAL HIGH (ref 0–149)
VLDL Cholesterol Cal: 30 mg/dL (ref 5–40)

## 2021-06-22 ENCOUNTER — Other Ambulatory Visit: Payer: Self-pay | Admitting: Internal Medicine

## 2021-06-22 LAB — DRUG SCREEN, URINE
Amphetamines, Urine: NEGATIVE ng/mL
Barbiturate screen, urine: NEGATIVE ng/mL
Benzodiazepine Quant, Ur: NEGATIVE ng/mL
Cannabinoid Quant, Ur: POSITIVE ng/mL — AB
Cocaine (Metab.): NEGATIVE ng/mL
Opiate Quant, Ur: NEGATIVE ng/mL
PCP Quant, Ur: NEGATIVE ng/mL

## 2021-06-28 ENCOUNTER — Ambulatory Visit: Payer: Medicaid Other | Admitting: Orthopedic Surgery

## 2021-07-06 ENCOUNTER — Ambulatory Visit: Payer: Medicare Other | Admitting: Neurology

## 2021-07-08 ENCOUNTER — Other Ambulatory Visit: Payer: Self-pay | Admitting: Nurse Practitioner

## 2021-07-11 ENCOUNTER — Telehealth: Payer: Self-pay | Admitting: Neurology

## 2021-07-11 NOTE — Telephone Encounter (Signed)
Pt has scheduled an appointment and is on wait list.  Pt is asking for a call re: any suggestions that can be made while she waits to be seen.  Pt states she is hardly able to get around due to pain she is in.  Please call

## 2021-07-11 NOTE — Telephone Encounter (Signed)
Pt called back and message from [place], [place] was relayed.  This is FYI no call back requested

## 2021-07-11 NOTE — Telephone Encounter (Signed)
Tried calling pt back. Went to VM, VM full and could not leave message. She has not been seen in over 1 yr. Will need to be seen first prior to making any adjustments/recommendations. In the meantine, she can contact PCP, go to ER or urgent care for eval/treatment until seen at our office. Please let her know if she calls back.

## 2021-07-13 ENCOUNTER — Telehealth: Payer: Self-pay | Admitting: Nurse Practitioner

## 2021-07-13 NOTE — Telephone Encounter (Signed)
Patient also requests rx for Flagyl (again)  And the phone number to ortho and hand specialist she was referred to.

## 2021-07-13 NOTE — Telephone Encounter (Signed)
Patient requests refills on  Prilosec Diloxipine Metformin Lasix Gabapentin Amlodipine   [facility] [address]

## 2021-07-18 ENCOUNTER — Other Ambulatory Visit: Payer: Self-pay | Admitting: Nurse Practitioner

## 2021-07-19 ENCOUNTER — Telehealth: Payer: Self-pay

## 2021-07-19 ENCOUNTER — Other Ambulatory Visit: Payer: Self-pay | Admitting: Nurse Practitioner

## 2021-07-19 NOTE — Telephone Encounter (Signed)
Gabapentin Metformin  Lasix Cymbalta Omeprazole Nasal Spray Zolfran   [facility]  [address]

## 2021-07-20 ENCOUNTER — Ambulatory Visit: Payer: Medicaid Other | Admitting: Nurse Practitioner

## 2021-07-22 ENCOUNTER — Ambulatory Visit: Payer: Medicaid Other | Admitting: Orthopaedic Surgery

## 2021-07-25 ENCOUNTER — Encounter (HOSPITAL_COMMUNITY): Payer: Self-pay

## 2021-07-25 ENCOUNTER — Encounter: Payer: Self-pay | Admitting: Nurse Practitioner

## 2021-07-25 ENCOUNTER — Other Ambulatory Visit (HOSPITAL_COMMUNITY): Payer: Self-pay

## 2021-07-25 ENCOUNTER — Telehealth: Payer: Self-pay

## 2021-07-25 ENCOUNTER — Ambulatory Visit (INDEPENDENT_AMBULATORY_CARE_PROVIDER_SITE_OTHER): Payer: Medicare Other | Admitting: Nurse Practitioner

## 2021-07-25 ENCOUNTER — Emergency Department (HOSPITAL_COMMUNITY)
Admission: EM | Admit: 2021-07-25 | Discharge: 2021-07-25 | Disposition: A | Discharge status: Home / Self Care | Payer: Medicare Other | Attending: Physician Assistant | Admitting: Physician Assistant

## 2021-07-25 ENCOUNTER — Other Ambulatory Visit: Payer: Self-pay

## 2021-07-25 VITALS — BP 151/95 | HR 78 | Temp 97.6°F | Wt 186.4 lb

## 2021-07-25 DIAGNOSIS — R079 Chest pain, unspecified: Secondary | ICD-10-CM | POA: Insufficient documentation

## 2021-07-25 DIAGNOSIS — R0602 Shortness of breath: Secondary | ICD-10-CM | POA: Diagnosis not present

## 2021-07-25 DIAGNOSIS — R110 Nausea: Secondary | ICD-10-CM | POA: Diagnosis not present

## 2021-07-25 DIAGNOSIS — N898 Other specified noninflammatory disorders of vagina: Secondary | ICD-10-CM

## 2021-07-25 DIAGNOSIS — Z9181 History of falling: Secondary | ICD-10-CM

## 2021-07-25 DIAGNOSIS — Z5321 Procedure and treatment not carried out due to patient leaving prior to being seen by health care provider: Secondary | ICD-10-CM | POA: Diagnosis not present

## 2021-07-25 DIAGNOSIS — G8929 Other chronic pain: Secondary | ICD-10-CM

## 2021-07-25 DIAGNOSIS — R1030 Lower abdominal pain, unspecified: Secondary | ICD-10-CM | POA: Diagnosis not present

## 2021-07-25 DIAGNOSIS — I10 Essential (primary) hypertension: Secondary | ICD-10-CM | POA: Diagnosis not present

## 2021-07-25 DIAGNOSIS — E119 Type 2 diabetes mellitus without complications: Secondary | ICD-10-CM

## 2021-07-25 DIAGNOSIS — Z9109 Other allergy status, other than to drugs and biological substances: Secondary | ICD-10-CM

## 2021-07-25 DIAGNOSIS — M25561 Pain in right knee: Secondary | ICD-10-CM

## 2021-07-25 DIAGNOSIS — Z130 Encounter for screening for diseases of the blood and blood-forming organs and certain disorders involving the immune mechanism: Secondary | ICD-10-CM

## 2021-07-25 MED ORDER — ROSUVASTATIN CALCIUM 5 MG PO TABS
5.0000 mg | ORAL_TABLET | Freq: Every day | ORAL | 5 refills | Status: DC
  Filled 2021-07-25 (×2): qty 30, 30d supply, fill #0

## 2021-07-25 MED ORDER — OMEPRAZOLE 40 MG PO CPDR
40.0000 mg | DELAYED_RELEASE_CAPSULE | Freq: Every day | ORAL | 1 refills | Status: DC
  Filled 2021-07-25: qty 90, 90d supply, fill #0
  Filled 2021-10-17: qty 90, 90d supply, fill #1

## 2021-07-25 MED ORDER — CLONIDINE HCL 0.1 MG PO TABS
0.1000 mg | ORAL_TABLET | Freq: Three times a day (TID) | ORAL | 5 refills | Status: DC
  Filled 2021-07-25: qty 90, 30d supply, fill #0
  Filled 2021-09-14: qty 90, 30d supply, fill #1
  Filled 2021-12-15: qty 90, 30d supply, fill #0
  Filled 2022-03-07: qty 90, 30d supply, fill #1
  Filled 2022-05-12: qty 90, 30d supply, fill #2
  Filled 2022-06-26: qty 90, 30d supply, fill #3

## 2021-07-25 MED ORDER — AMLODIPINE BESYLATE 10 MG PO TABS
10.0000 mg | ORAL_TABLET | Freq: Every day | ORAL | 5 refills | Status: DC
  Filled 2021-07-25: qty 30, 30d supply, fill #0
  Filled 2021-08-26 – 2021-09-14 (×2): qty 30, 30d supply, fill #1

## 2021-07-25 MED ORDER — GABAPENTIN 800 MG PO TABS
800.0000 mg | ORAL_TABLET | Freq: Three times a day (TID) | ORAL | 5 refills | Status: DC
  Filled 2021-07-25: qty 90, 30d supply, fill #0
  Filled 2021-09-14: qty 90, 30d supply, fill #1
  Filled 2021-10-17: qty 90, 30d supply, fill #2
  Filled 2021-11-11: qty 90, 30d supply, fill #3

## 2021-07-25 MED ORDER — IBUPROFEN 800 MG PO TABS
800.0000 mg | ORAL_TABLET | Freq: Three times a day (TID) | ORAL | 5 refills | Status: DC | PRN
Start: 2021-07-25 — End: 2022-01-09
  Filled 2021-07-25: qty 30, 10d supply, fill #0
  Filled 2021-08-12: qty 30, 10d supply, fill #1
  Filled 2021-08-26: qty 30, 10d supply, fill #2

## 2021-07-25 MED ORDER — FLUTICASONE PROPIONATE 50 MCG/ACT NA SUSP
2.0000 | Freq: Every day | NASAL | 1 refills | Status: DC
  Filled 2021-07-25 – 2021-08-26 (×2): qty 48, 90d supply, fill #0

## 2021-07-25 MED ORDER — DULOXETINE HCL 60 MG PO CPEP
60.0000 mg | ORAL_CAPSULE | Freq: Two times a day (BID) | ORAL | 5 refills | Status: DC
  Filled 2021-07-25: qty 60, 30d supply, fill #0
  Filled 2021-08-26 – 2021-09-14 (×2): qty 60, 30d supply, fill #1
  Filled 2021-10-17: qty 60, 30d supply, fill #2

## 2021-07-25 MED ORDER — LATANOPROST 0.005 % OP SOLN
1.0000 [drp] | Freq: Every day | OPHTHALMIC | 0 refills | Status: DC
  Filled 2021-07-25: qty 2.5, 50d supply, fill #0

## 2021-07-25 MED ORDER — POTASSIUM CHLORIDE CRYS ER 20 MEQ PO TBCR
20.0000 meq | EXTENDED_RELEASE_TABLET | Freq: Every day | ORAL | 0 refills | Status: DC
  Filled 2021-07-25: qty 30, 30d supply, fill #0

## 2021-07-25 MED ORDER — ONDANSETRON HCL 4 MG PO TABS
4.0000 mg | ORAL_TABLET | Freq: Four times a day (QID) | ORAL | 2 refills | Status: DC | PRN
  Filled 2021-07-25: qty 60, 15d supply, fill #0

## 2021-07-25 MED ORDER — FUROSEMIDE 20 MG PO TABS
20.0000 mg | ORAL_TABLET | Freq: Every day | ORAL | 5 refills | Status: DC
  Filled 2021-07-25: qty 30, 30d supply, fill #0
  Filled 2021-08-26: qty 30, 30d supply, fill #1
  Filled 2021-10-17: qty 30, 30d supply, fill #2

## 2021-07-25 MED ORDER — GABAPENTIN 300 MG PO CAPS
800.0000 mg | ORAL_CAPSULE | Freq: Once | ORAL | Status: DC
  Filled 2021-07-25: qty 2

## 2021-07-25 MED ORDER — METFORMIN HCL 500 MG PO TABS
500.0000 mg | ORAL_TABLET | Freq: Two times a day (BID) | ORAL | 5 refills | Status: DC
  Filled 2021-07-25: qty 60, 30d supply, fill #0
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
## 2021-07-25 NOTE — Patient Instructions (Signed)
Managing Your Hypertension Hypertension, also called high blood pressure, is when the force of the blood pressing against the walls of the arteries is too strong. Arteries are blood vessels that carry blood from your heart throughout your body. Hypertension forces the heart to work harder to pump blood and may cause the arteries to become narrow or stiff. Understanding blood pressure readings Your personal target blood pressure may vary depending on your medical conditions, your age, and other factors. A blood pressure reading includes a higher number over a lower number. Ideally, your blood pressure should be below 120/80. You should know that: The first, or top, number is called the systolic pressure. It is a measure of the pressure in your arteries as your heart beats. The second, or bottom number, is called the diastolic pressure. It is a measure of the pressure in your arteries as the heart relaxes. Blood pressure is classified into four stages. Based on your blood pressure reading, your health care provider may use the following stages to determine what type of treatment you need, if any. Systolic pressure and diastolic pressure are measured in a unit called mmHg. Normal Systolic pressure: below 120. Diastolic pressure: below 80. Elevated Systolic pressure: 120-129. Diastolic pressure: below 80. Hypertension stage 1 Systolic pressure: 130-139. Diastolic pressure: 80-89. Hypertension stage 2 Systolic pressure: 140 or above. Diastolic pressure: 90 or above. How can this condition affect me? Managing your hypertension is an important responsibility. Over time, hypertension can damage the arteries and decrease blood flow to important parts of the body, including the brain, heart, and kidneys. Having untreated or uncontrolled hypertension can lead to: A heart attack. A stroke. A weakened blood vessel (aneurysm). Heart failure. Kidney damage. Eye damage. Metabolic syndrome. Memory and  concentration problems. Vascular dementia. What actions can I take to manage this condition? Hypertension can be managed by making lifestyle changes and possibly by taking medicines. Your health care provider will help you make a plan to bring your blood pressure within a normal range. Nutrition  Eat a diet that is high in fiber and potassium, and low in salt (sodium), added sugar, and fat. An example eating plan is called the Dietary Approaches to Stop Hypertension (DASH) diet. To eat this way: Eat plenty of fresh fruits and vegetables. Try to fill one-half of your plate at each meal with fruits and vegetables. Eat whole grains, such as whole-wheat pasta, [place] rice, or whole-grain bread. Fill about one-fourth of your plate with whole grains. Eat low-fat dairy products. Avoid fatty cuts of meat, processed or cured meats, and poultry with skin. Fill about one-fourth of your plate with lean proteins such as fish, chicken without skin, beans, eggs, and tofu. Avoid pre-made and processed foods. These tend to be higher in sodium, added sugar, and fat. Reduce your daily sodium intake. Most people with hypertension should eat less than 1,500 mg of sodium a day. Lifestyle  Work with your health care provider to maintain a healthy body weight or to lose weight. Ask what an ideal weight is for you. Get at least 30 minutes of exercise that causes your heart to beat faster (aerobic exercise) most days of the week. Activities may include walking, swimming, or biking. Include exercise to strengthen your muscles (resistance exercise), such as weight lifting, as part of your weekly exercise routine. Try to do these types of exercises for 30 minutes at least 3 days a week. Do not use any products that contain nicotine or tobacco, such as cigarettes, e-cigarettes,   and chewing tobacco. If you need help quitting, ask your health care provider. Control any long-term (chronic) conditions you have, such as high  cholesterol or diabetes. Identify your sources of stress and find ways to manage stress. This may include meditation, deep breathing, or making time for fun activities. Alcohol use Do not drink alcohol if: Your health care provider tells you not to drink. You are pregnant, may be pregnant, or are planning to become pregnant. If you drink alcohol: Limit how much you use to: 0-1 drink a day for women. 0-2 drinks a day for men. Be aware of how much alcohol is in your drink. In the [place] one drink equals one 12 oz bottle of beer (355 mL), one 5 oz glass of wine (148 mL), or one 1 oz glass of hard liquor (44 mL). Medicines Your health care provider may prescribe medicine if lifestyle changes are not enough to get your blood pressure under control and if: Your systolic blood pressure is 130 or higher. Your diastolic blood pressure is 80 or higher. Take medicines only as told by your health care provider. Follow the directions carefully. Blood pressure medicines must be taken as told by your health care provider. The medicine does not work as well when you skip doses. Skipping doses also puts you at risk for problems. Monitoring Before you monitor your blood pressure: Do not smoke, drink caffeinated beverages, or exercise within 30 minutes before taking a measurement. Use the bathroom and empty your bladder (urinate). Sit quietly for at least 5 minutes before taking measurements. Monitor your blood pressure at home as told by your health care provider. To do this: Sit with your back straight and supported. Place your feet flat on the floor. Do not cross your legs. Support your arm on a flat surface, such as a table. Make sure your upper arm is at heart level. Each time you measure, take two or three readings one minute apart and record the results. You may also need to have your blood pressure checked regularly by your health care provider. General information Talk with your health care  provider about your diet, exercise habits, and other lifestyle factors that may be contributing to hypertension. Review all the medicines you take with your health care provider because there may be side effects or interactions. Keep all visits as told by your health care provider. Your health care provider can help you create and adjust your plan for managing your high blood pressure. Where to find more information National Heart, Lung, and Blood Institute: www.nhlbi.nih.gov American Heart Association: www.heart.org Contact a health care provider if: You think you are having a reaction to medicines you have taken. You have repeated (recurrent) headaches. You feel dizzy. You have swelling in your ankles. You have trouble with your vision. Get help right away if: You develop a severe headache or confusion. You have unusual weakness or numbness, or you feel faint. You have severe pain in your chest or abdomen. You vomit repeatedly. You have trouble breathing. These symptoms may represent a serious problem that is an emergency. Do not wait to see if the symptoms will go away. Get medical help right away. Call your local emergency services (911 in the [place] Do not drive yourself to the hospital. Summary Hypertension is when the force of blood pumping through your arteries is too strong. If this condition is not controlled, it may put you at risk for serious complications. Your personal target blood pressure may vary depending on   your medical conditions, your age, and other factors. For most people, a normal blood pressure is less than 120/80. Hypertension is managed by lifestyle changes, medicines, or both. Lifestyle changes to help manage hypertension include losing weight, eating a healthy, low-sodium diet, exercising more, stopping smoking, and limiting alcohol. This information is not intended to replace advice given to you by your health care provider. Make sure you discuss any questions  you have with your health care provider. Document Revised: 08/25/2019 Document Reviewed: 07/08/2019 Elsevier Patient Education  2022 Elsevier Inc.  
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
## 2021-07-25 NOTE — ED Notes (Signed)
Patient was not in the lobby when called back to receive Neurontin. Registration reported that the patient asked about the shuttle so she could leave.
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
## 2021-07-26 LAB — CBC WITH DIFFERENTIAL/PLATELET
Basophils Absolute: 0.1 10*3/uL (ref 0.0–0.2)
Basos: 1 %
EOS (ABSOLUTE): 0.1 10*3/uL (ref 0.0–0.4)
Eos: 2 %
Hematocrit: 47.3 % — ABNORMAL HIGH (ref 34.0–46.6)
Hemoglobin: 15.7 g/dL (ref 11.1–15.9)
Immature Grans (Abs): 0 10*3/uL (ref 0.0–0.1)
Immature Granulocytes: 0 %
Lymphocytes Absolute: 2 10*3/uL (ref 0.7–3.1)
Lymphs: 28 %
MCH: 29.5 pg (ref 26.6–33.0)
MCHC: 33.2 g/dL (ref 31.5–35.7)
MCV: 89 fL (ref 79–97)
Monocytes Absolute: 0.4 10*3/uL (ref 0.1–0.9)
Monocytes: 6 %
Neutrophils Absolute: 4.6 10*3/uL (ref 1.4–7.0)
Neutrophils: 63 %
Platelets: 234 10*3/uL (ref 150–450)
RBC: 5.32 x10E6/uL — ABNORMAL HIGH (ref 3.77–5.28)
RDW: 11.9 % (ref 11.7–15.4)
WBC: 7.3 10*3/uL (ref 3.4–10.8)

## 2021-07-26 LAB — COMP. METABOLIC PANEL (12)
AST: 18 IU/L (ref 0–40)
Albumin/Globulin Ratio: 1.7 (ref 1.2–2.2)
Albumin: 4.7 g/dL (ref 3.8–4.8)
Alkaline Phosphatase: 124 IU/L — ABNORMAL HIGH (ref 44–121)
BUN/Creatinine Ratio: 10 (ref 9–23)
BUN: 8 mg/dL (ref 6–24)
Bilirubin Total: 0.3 mg/dL (ref 0.0–1.2)
Calcium: 10.1 mg/dL (ref 8.7–10.2)
Chloride: 101 mmol/L (ref 96–106)
Creatinine, Ser: 0.77 mg/dL (ref 0.57–1.00)
Globulin, Total: 2.7 g/dL (ref 1.5–4.5)
Glucose: 92 mg/dL (ref 70–99)
Potassium: 3.4 mmol/L — ABNORMAL LOW (ref 3.5–5.2)
Sodium: 140 mmol/L (ref 134–144)
Total Protein: 7.4 g/dL (ref 6.0–8.5)
eGFR: 96 mL/min/{1.73_m2} (ref >59)

## 2021-07-26 LAB — SEDIMENTATION RATE: Sed Rate: 27 mm/hr (ref 0–32)

## 2021-07-27 ENCOUNTER — Ambulatory Visit: Payer: Medicaid Other | Admitting: Nurse Practitioner

## 2021-07-29 LAB — NUSWAB VAGINITIS PLUS (VG+)
Candida albicans, NAA: NEGATIVE
Candida glabrata, NAA: NEGATIVE
Chlamydia trachomatis, NAA: NEGATIVE
Neisseria gonorrhoeae, NAA: NEGATIVE
Trich vag by NAA: NEGATIVE

## 2021-08-12 ENCOUNTER — Other Ambulatory Visit (HOSPITAL_COMMUNITY): Payer: Self-pay

## 2021-08-17 ENCOUNTER — Encounter: Payer: Self-pay | Admitting: Neurology

## 2021-08-20 IMAGING — CT CT HEAD W/O CM
3 of 5 series · 15 of 47 positions shown, 18 images · non-contrast
Comparison: 10/26/2019

CLINICAL DATA: Altered mental status

EXAM:
CT HEAD WITHOUT CONTRAST
TECHNIQUE: Contiguous axial images were obtained from the base of the skull
through the vertex without intravenous contrast.

[Series 5: head 2.0 h70h · axial · 0.39mm/px · z∈[+1507,+1627]mm · 9 of 68 slices shown, 12 images]
[im 4/68  brain]
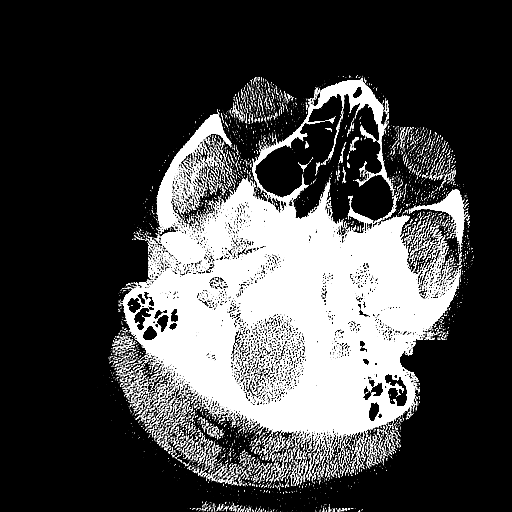
[im 4/68  bone]
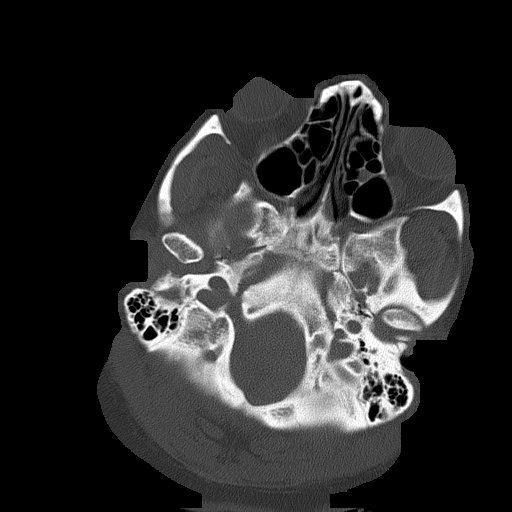
[im 12/68  brain]
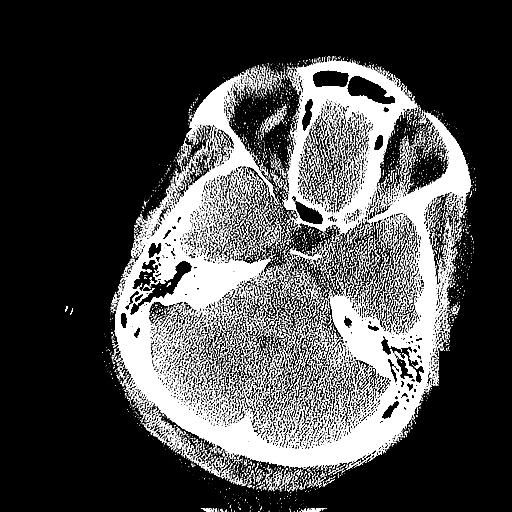
[im 19/68  brain]
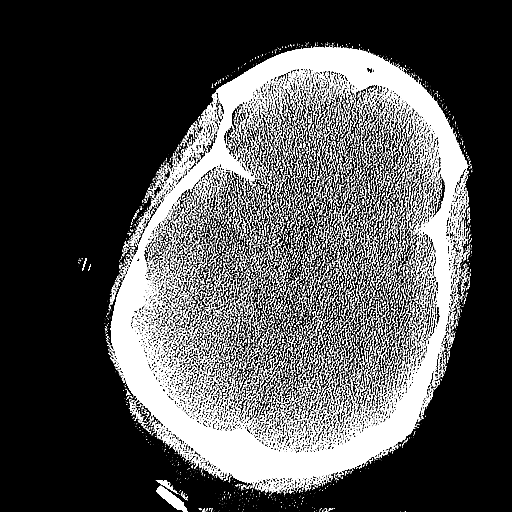
[im 27/68  brain]
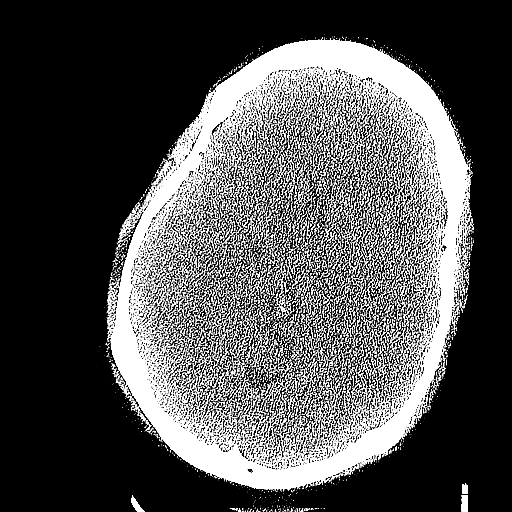
[im 34/68  brain]
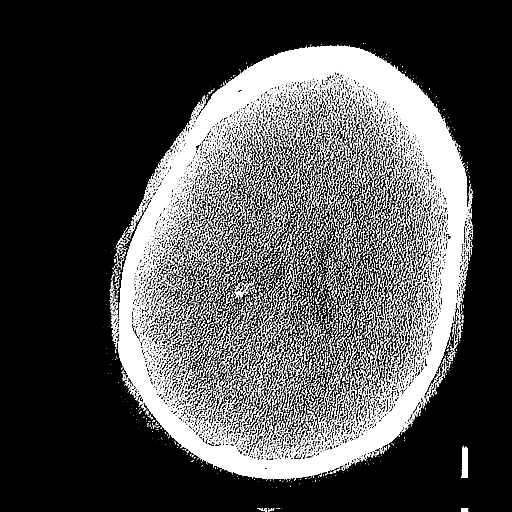
[im 34/68  bone]
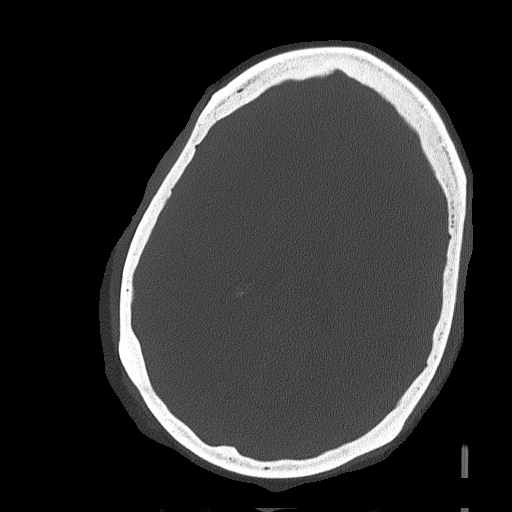
[im 41/68  brain]
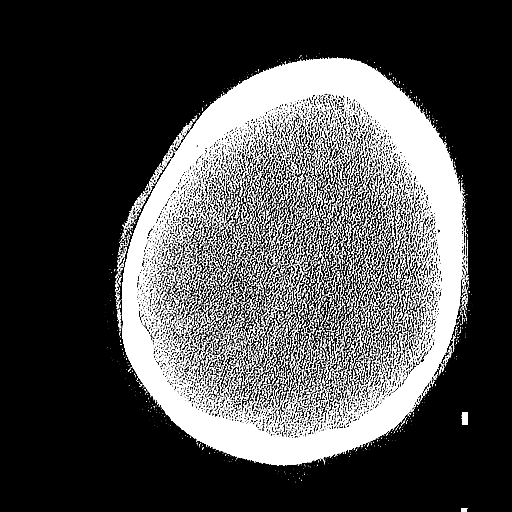
[im 49/68  brain]
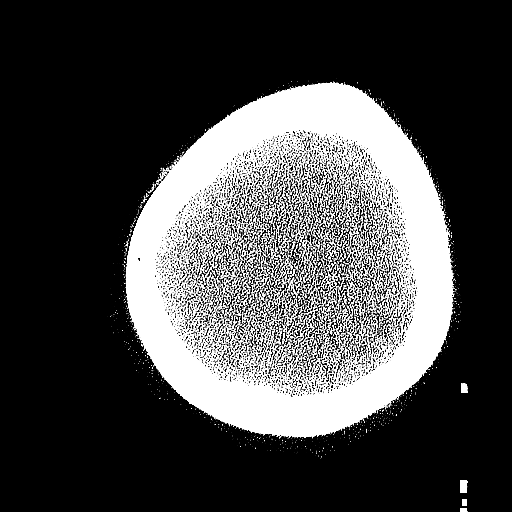
[im 56/68  brain]
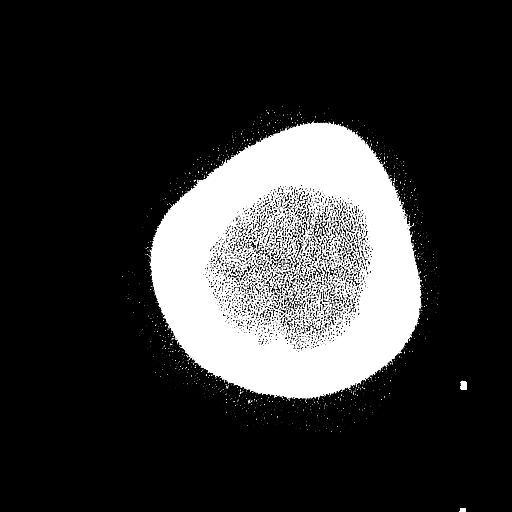
[im 64/68  brain]
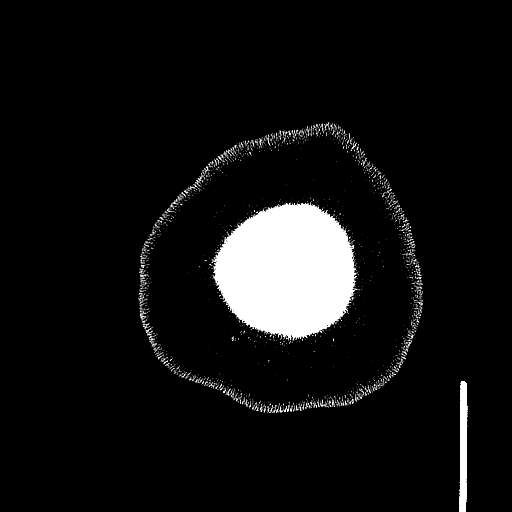
[im 64/68  bone]
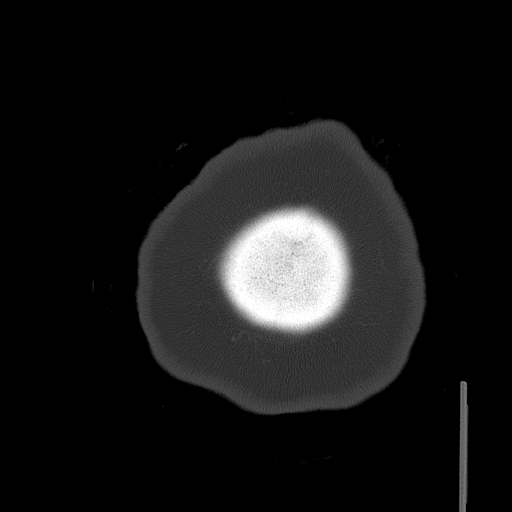

[Series 6: head 3.0 mpr cor · coronal · 0.30mm/px · 3 of 67 slices shown]
[im 23/67  brain]
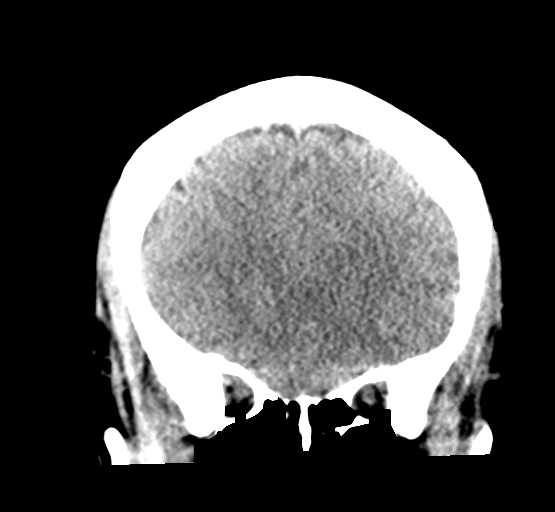
[im 30/67  brain]
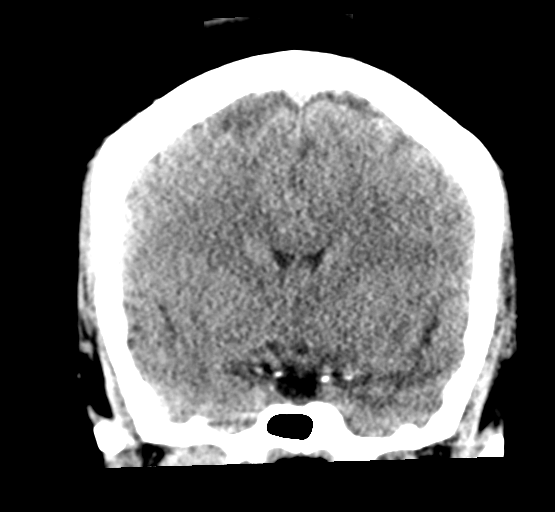
[im 37/67  brain]
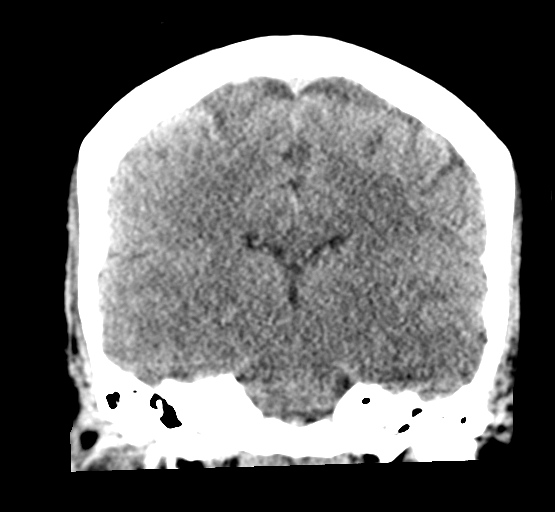

[Series 7: head 3.0 mpr sag · sagittal · 0.30mm/px · 3 of 57 slices shown]
[im 19/57  brain]
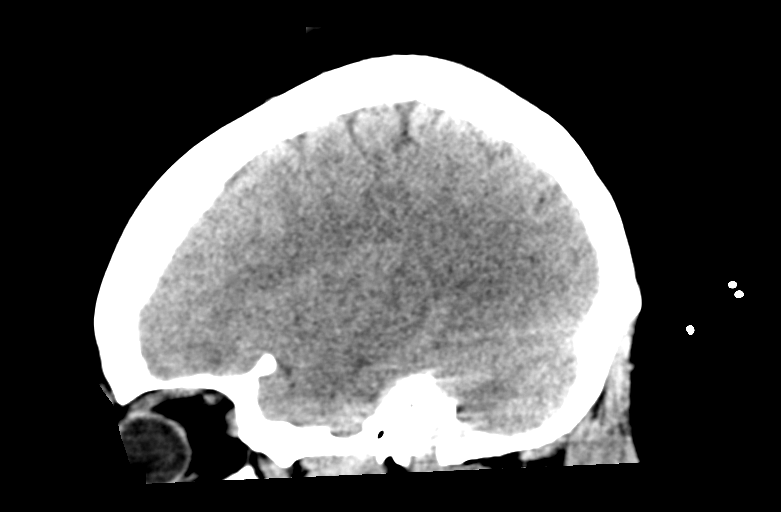
[im 29/57  brain]
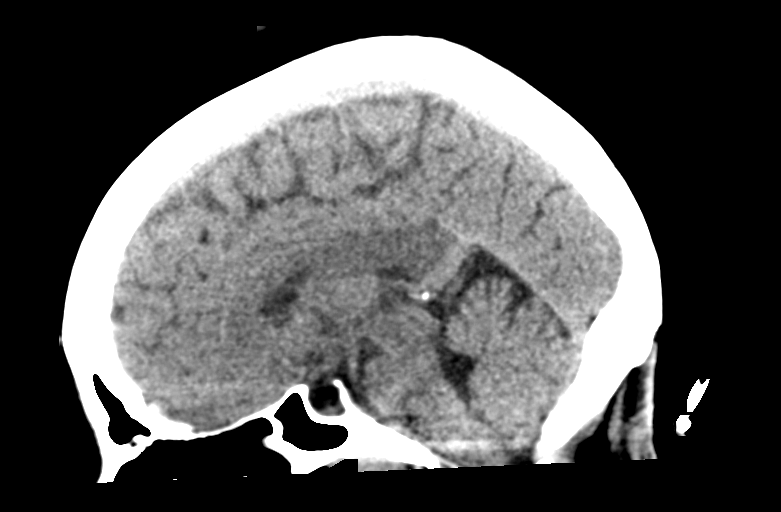
[im 38/57  brain]
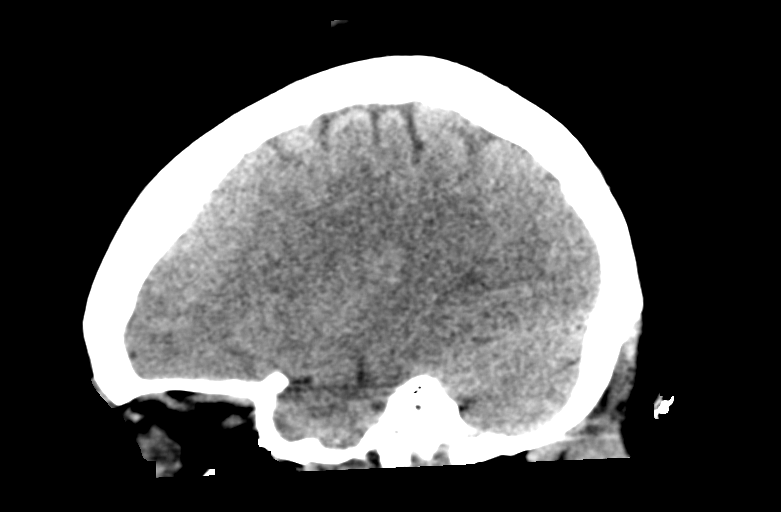

[15 of 47 positions shown; findings below may reference images not displayed]

FINDINGS: Brain: No evidence of acute infarction, hemorrhage, hydrocephalus,
extra-axial collection or mass lesion/mass effect.

Vascular: No hyperdense vessel or unexpected calcification.

Skull: Normal. Negative for fracture or focal lesion.

Sinuses/Orbits: No acute finding.

Other: None.
IMPRESSION: No acute intracranial abnormality noted.

## 2021-08-26 ENCOUNTER — Other Ambulatory Visit (HOSPITAL_COMMUNITY): Payer: Self-pay

## 2021-08-26 ENCOUNTER — Other Ambulatory Visit: Payer: Self-pay | Admitting: Nurse Practitioner

## 2021-08-27 ENCOUNTER — Other Ambulatory Visit (HOSPITAL_COMMUNITY): Payer: Self-pay

## 2021-08-29 ENCOUNTER — Ambulatory Visit: Payer: Medicaid Other | Admitting: Orthopaedic Surgery

## 2021-08-30 ENCOUNTER — Other Ambulatory Visit (HOSPITAL_COMMUNITY): Payer: Self-pay

## 2021-08-30 MED ORDER — POTASSIUM CHLORIDE CRYS ER 20 MEQ PO TBCR
20.0000 meq | EXTENDED_RELEASE_TABLET | Freq: Every day | ORAL | 0 refills | Status: DC
  Filled 2021-08-30 – 2021-09-14 (×2): qty 30, 30d supply, fill #0

## 2021-09-06 ENCOUNTER — Other Ambulatory Visit (HOSPITAL_COMMUNITY): Payer: Self-pay

## 2021-09-08 ENCOUNTER — Ambulatory Visit (HOSPITAL_COMMUNITY)
Admission: EM | Admit: 2021-09-08 | Discharge: 2021-09-08 | Disposition: A | Discharge status: Home / Self Care | Payer: 59

## 2021-09-08 ENCOUNTER — Other Ambulatory Visit: Payer: Self-pay

## 2021-09-08 ENCOUNTER — Ambulatory Visit (INDEPENDENT_AMBULATORY_CARE_PROVIDER_SITE_OTHER): Payer: Medicaid Other | Admitting: Nurse Practitioner

## 2021-09-08 ENCOUNTER — Encounter: Payer: Self-pay | Admitting: Nurse Practitioner

## 2021-09-08 ENCOUNTER — Emergency Department (HOSPITAL_COMMUNITY)
Admission: EM | Admit: 2021-09-08 | Discharge: 2021-09-08 | Disposition: A | Discharge status: Home / Self Care | Payer: Medicare Other | Source: Home / Self Care | Attending: Emergency Medicine | Admitting: Emergency Medicine

## 2021-09-08 ENCOUNTER — Encounter (HOSPITAL_COMMUNITY): Payer: Self-pay

## 2021-09-08 ENCOUNTER — Encounter (HOSPITAL_COMMUNITY): Payer: Self-pay | Admitting: Emergency Medicine

## 2021-09-08 ENCOUNTER — Emergency Department (HOSPITAL_COMMUNITY)
Admission: EM | Admit: 2021-09-08 | Discharge: 2021-09-08 | Disposition: A | Discharge status: Home / Self Care | Payer: Medicare Other | Attending: Emergency Medicine | Admitting: Emergency Medicine

## 2021-09-08 VITALS — BP 112/83 | HR 87 | Temp 98.1°F | Ht 63.0 in | Wt 186.0 lb

## 2021-09-08 DIAGNOSIS — Z760 Encounter for issue of repeat prescription: Secondary | ICD-10-CM | POA: Diagnosis not present

## 2021-09-08 DIAGNOSIS — R531 Weakness: Secondary | ICD-10-CM | POA: Diagnosis not present

## 2021-09-08 DIAGNOSIS — J45909 Unspecified asthma, uncomplicated: Secondary | ICD-10-CM | POA: Insufficient documentation

## 2021-09-08 DIAGNOSIS — F1129 Opioid dependence with unspecified opioid-induced disorder: Secondary | ICD-10-CM | POA: Insufficient documentation

## 2021-09-08 DIAGNOSIS — I10 Essential (primary) hypertension: Secondary | ICD-10-CM | POA: Insufficient documentation

## 2021-09-08 DIAGNOSIS — R5383 Other fatigue: Secondary | ICD-10-CM | POA: Insufficient documentation

## 2021-09-08 DIAGNOSIS — M791 Myalgia, unspecified site: Secondary | ICD-10-CM | POA: Insufficient documentation

## 2021-09-08 DIAGNOSIS — R0902 Hypoxemia: Secondary | ICD-10-CM | POA: Diagnosis not present

## 2021-09-08 DIAGNOSIS — E119 Type 2 diabetes mellitus without complications: Secondary | ICD-10-CM | POA: Diagnosis not present

## 2021-09-08 DIAGNOSIS — M25531 Pain in right wrist: Secondary | ICD-10-CM | POA: Insufficient documentation

## 2021-09-08 DIAGNOSIS — Z09 Encounter for follow-up examination after completed treatment for conditions other than malignant neoplasm: Secondary | ICD-10-CM

## 2021-09-08 DIAGNOSIS — M25532 Pain in left wrist: Secondary | ICD-10-CM | POA: Insufficient documentation

## 2021-09-08 DIAGNOSIS — G8929 Other chronic pain: Secondary | ICD-10-CM | POA: Diagnosis not present

## 2021-09-08 DIAGNOSIS — Z743 Need for continuous supervision: Secondary | ICD-10-CM | POA: Diagnosis not present

## 2021-09-08 MED ORDER — HYDROXYZINE PAMOATE 50 MG PO CAPS
50.0000 mg | ORAL_CAPSULE | Freq: Four times a day (QID) | ORAL | 0 refills | Status: DC | PRN
  Filled 2021-09-08: qty 30, 8d supply, fill #0

## 2021-09-08 NOTE — ED Notes (Signed)
Pt is in need of medication, due to trying to find new doctor.

## 2021-09-08 NOTE — ED Notes (Signed)
On cardiac monitor. Oxygen sat will drop to 88 occasionally. Patient placed on O2 at 3L via [place]. Patient refuses any other treatment. O2 sat 97% on 3L. Airway patent. Skin w/d/I. Side rails up. Call bell in reach.

## 2021-09-08 NOTE — ED Triage Notes (Signed)
"  History of heroin abuse, has been on subutex, has been out of it, went to [facility] and tried to get it this morning and they wouldn't give it to her, then she went to a clinic here in [place] and they didn't know her and wouldn't give it to her. They noted she was nodding off and looked like she had possibly overdosed on drugs and called [place]" per EMS She wanted to drive away, but the police would not let her drive due to her state. Her daughter was in the car and was in the same state, but refused treatment.
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
## 2021-09-08 NOTE — ED Notes (Signed)
Pt awake and able to answer questions, pt ambulatory to restroom

## 2021-09-08 NOTE — ED Notes (Signed)
Pt very angry yelling at staff , pt has been discharged

## 2021-09-08 NOTE — ED Provider Notes (Signed)
[place] [facility] EMERGENCY DEPARTMENT Provider Note   CSN: [id number] Arrival date & time: 09/08/21  1750     History  No chief complaint on file.   Leah Barber is a 46 y.o. female.  The history is provided by the EMS personnel and medical records. No language interpreter was used.   46 year old female with significant history of polysubstance abuse including benzodiazepine abuse cocaine abuse and heroin abuse brought here via EMS for evaluation of altered mental status.  I reviewed patient's previous ER visit which was earlier this morning.  Patient initially was seen early this morning in the ER with request for refill of her Klonopin and Suboxone and reportedly states that her prescribing physician was no longer able to fill her prescription.  She was evaluated and subsequently discharged without refill of the two requested medications.  Triage note report the patient however was seen at a clinic in [place] with requesting for medication refill and did not receive her medication however the staff there was concerned that she may have been overdose on drugs because she appears to be drowsy.  Home Medications Prior to Admission medications   Medication Sig Start Date End Date Taking? Authorizing Provider  Albuterol Sulfate (PROAIR RESPICLICK) 283 (90 Base) MCG/ACT AEPB Inhale 2 puffs into the lungs 4 (four) times daily as needed. 05/17/20 05/17/21  [name], NP  amLODipine (NORVASC) 10 MG tablet Take 1 tablet (10 mg total) by mouth daily. 07/25/21 01/21/22  [name], NP  buprenorphine (SUBUTEX) 8 MG SUBL SL tablet Place 8 mg under the tongue every 8 (eight) hours. 03/31/20   [provider]  cetirizine (ZYRTEC) 10 MG tablet TAKE 1 TABLET BY MOUTH EVERY DAY 04/11/21   [name], NP  clonazePAM Bobbye [place]) 0.5 MG tablet  02/02/21   [provider]  cloNIDine (CATAPRES) 0.1 MG tablet Take 1 tablet (0.1 mg total) by mouth 3 (three) times daily.  07/25/21 01/21/22  [name], NP  CONCERTA 18 MG CR tablet Take 18 mg by mouth daily as needed. 05/17/20   [provider]  DULoxetine (CYMBALTA) 60 MG capsule Take 1 capsule (60 mg total) by mouth 2 (two) times daily. 07/25/21 01/21/22  [name], NP  fluticasone (FLONASE) 50 MCG/ACT nasal spray Place 2 sprays into both nostrils daily. 07/25/21   [name], NP  furosemide (LASIX) 20 MG tablet Take 1 tablet (20 mg total) by mouth daily. 07/25/21 01/21/22  [name], NP  gabapentin (NEURONTIN) 800 MG tablet Take 1 tablet (800 mg total) by mouth every 8 (eight) hours. 07/25/21 01/21/22  [name], NP  hydrOXYzine (VISTARIL) 50 MG capsule Take 50 mg by mouth 4 (four) times daily as needed for anxiety.  Patient not taking: Reported on 09/08/2021 11/26/19   [provider]  ibuprofen (ADVIL) 800 MG tablet Take 1 tablet (800 mg total) by mouth every 8 (eight) hours as needed. 07/25/21 01/21/22  [name], NP  latanoprost (XALATAN) 0.005 % ophthalmic solution Place 1 drop into both eyes at bedtime. 07/25/21   [name], NP  Lidocaine (HM LIDOCAINE PATCH) 4 % PTCH Apply 1 patch topically daily. 06/20/21   Bo Merino I, NP  metFORMIN (GLUCOPHAGE) 500 MG tablet Take 1 tablet (500 mg total) by mouth 2 (two) times daily with a meal. 07/25/21 01/21/22  [name], NP  Multiple Vitamins-Minerals (WOMENS MULTIVITAMIN PO) Take 1 tablet by mouth daily.  [provider]  omeprazole (PRILOSEC) 40 MG capsule Take 1 capsule (40 mg total) by mouth daily. 07/25/21 01/21/22  [name], NP  ondansetron (ZOFRAN) 4 MG tablet Take 1 tablet (4 mg total) by mouth every 6 (six) hours as needed for nausea or vomiting. 07/25/21   [name], NP  potassium chloride SA (KLOR-CON M) 20 MEQ tablet Take 1 tablet (20 mEq total) by mouth daily. 08/30/21 09/29/21  [name], NP  rosuvastatin (CRESTOR) 5 MG tablet Take 1 tablet (5 mg total) by mouth daily. 07/25/21 01/21/22   [name], NP  zolpidem (AMBIEN) 5 MG tablet Take 1 tablet (5 mg total) by mouth at bedtime as needed for up to 5 days for sleep. 03/23/20 03/28/20  Dorena Dew, FNP      Allergies    Clindamycin/lincomycin, Other, Hydrocodone, and Morphine    Review of Systems   Review of Systems  Unable to perform ROS: Mental status change   Physical Exam Updated Vital Signs BP 122/87    Pulse 76    Resp 12    Ht 5\' 3"  (1.6 m)    Wt 84.4 kg    LMP 09/14/2019 (Approximate)    SpO2 95%    BMI 32.95 kg/m  Physical Exam Vitals and nursing note reviewed.  Constitutional:      General: She is not in acute distress.    Appearance: She is well-developed.     Comments: Sleepy but arousable, refused to communicate  HENT:     Head: Normocephalic and atraumatic.  Eyes:     Conjunctiva/sclera: Conjunctivae normal.     Comments: Pupils 3 mm and reactive bilaterally  Cardiovascular:     Rate and Rhythm: Normal rate and regular rhythm.     Pulses: Normal pulses.     Heart sounds: Normal heart sounds.  Pulmonary:     Effort: Pulmonary effort is normal.  Abdominal:     Palpations: Abdomen is soft.  Musculoskeletal:     Cervical back: Neck supple.  Skin:    Findings: No rash.  Neurological:     GCS: GCS eye subscore is 3. GCS verbal subscore is 5. GCS motor subscore is 5.     Cranial Nerves: No facial asymmetry.     Sensory: Sensation is intact.  Psychiatric:        Mood and Affect: Mood normal.    ED Results / Procedures / Treatments   Labs (all labs ordered are listed, but only abnormal results are displayed) Labs Reviewed - No data to display  EKG EKG Interpretation  Date/Time:  Thursday September 08 2021 17:54:18 EST Ventricular Rate:  71 PR Interval:  158 QRS Duration: 97 QT Interval:  420 QTC Calculation: 457 R Axis:   236 Text Interpretation: Sinus arrhythmia Right atrial enlargement Probable lateral infarct, age indeterminate When compared to prior ECG, similar appearance. NO  STEMI Confirmed by Antony Blackbird [phone]) on 09/08/2021 5:59:22 PM  Radiology No results found.  Procedures Procedures    Medications Ordered in ED Medications - No data to display  ED Course/ Medical Decision Making/ A&P                           Medical Decision Making  BP 130/86    Pulse 85    Resp (!) 22    Ht 5\' 3"  (1.6 m)    Wt 84.4 kg    LMP 09/14/2019 (Approximate)  SpO2 98%    BMI 32.95 kg/m   6:10 PM Patient with known history of polysubstance use including heroin, opiate, and benzos use brought here for altered mental status.  I have reviewed patient's previous ER visit note from early this morning when she went to the ER requesting for a refill of her Suboxone as well as her Klonopin but did not receive the prescription as it was not appropriate.  She then went to another facility requesting for same but was refused however as she was noted to be drowsy at the other facility and EMS brought her here for further assessment.  Patient refused any kind of treatment per nursing staff.  Nursing staff also found a pill bottle containing urine in her possession.  And she was noted to be hypoxic at rest with O2 sats below 90%, she was placed on supplemental oxygen.  At this time, I suspect patient is under the influence of recreational drugs causing altered mental status.  Plan to monitor patient closely until she is more alert and oriented for reassessment.    I have considered stroke, head injury, ACS, PE, delirium from infectious source, however felt that it is less likely.  I have considered obtain labs and head imaging but did not based on her current presentation and her most recent multiple visits requesting for refill of her Suboxone and Klonopin medications.  EKG independently reviewed and interpreted by me showing HR 71 with sinus arrhythmia and unchanged from prior.   10:54 PM Patient is resting comfortably, arousable but does not express desire to going home at the  moment.  11:22 PM At this time patient is back to her baseline, able to ambulate, able to communicate.  Patient request for Suboxone I strong encourage patient to follow-up with specialist for outpatient management of her opiate addiction.  Patient does not exhibit any significant withdrawal symptoms vital sign reassuring.  Will provide outpatient resources but she is otherwise stable for discharge.  Return precaution given.        Final Clinical Impression(s) / ED Diagnoses Final diagnoses:  Opioid dependence with opioid-induced disorder [facility])    Rx / DC Orders ED Discharge Orders          Ordered    hydrOXYzine (VISTARIL) 50 MG capsule  4 times daily PRN        09/08/21 2324              [name], PA-C 09/08/21 2326    Tegeler, Gwenyth Allegra, MD 09/09/21 0005

## 2021-09-08 NOTE — BH Assessment (Signed)
Pt reports her previous provider retired and he gave her a 30 day supply of suboxone and now she is out of her medications and needs a refill. Pt reports going to the ED who encouraged her to come here. TTS told pt of the possibility that NP will not prescribe medications but she can talk with the NP to see what they could do to help her. Pt reports she does not want to wait if NP can not give her medications today. Pt reported she was going to leave AMA and TTS provided pt with resources. Pt denied SI, HI, AVH. Waiver signed.   Pt is routine and left AMA before provider could see pt.

## 2021-09-08 NOTE — ED Triage Notes (Addendum)
Pt states her psychiatrist told her they were no longer prescribing her clonopin and subutex, states she needs help. Last took these Saturday. Also reports a knot on her R and L wrists.

## 2021-09-08 NOTE — ED Triage Notes (Signed)
Upon arrival patient is drowsy and falling asleep. Able to arouse with touch. Patient refusing any treatment. States she just wants to sleep. Pill bottle noted between her breast full of liquid.

## 2021-09-08 NOTE — ED Notes (Signed)
Patient is resting comfortably. 

## 2021-09-08 NOTE — Discharge Instructions (Addendum)
Please follow-up with your doctor to request for Suboxone treatment for your opioid dependency.  You may take Vistaril as needed for anxiety or nausea.  Use resource below for further management of your condition.

## 2021-09-09 ENCOUNTER — Other Ambulatory Visit (HOSPITAL_COMMUNITY): Payer: Self-pay

## 2021-09-09 NOTE — Discharge Planning (Signed)
RNCM called to assist pt who was discharged to the lobby needing transportation home. RNCM provided [place] as pt has no transportation from hospital and stated that she can not walk to bus stop.  RNCM presented and explained [place] and Release of Liability Form.  Pt signed waiver, therefore agreeing to written terms.

## 2021-09-09 NOTE — Progress Notes (Signed)
° °  [place],   [address] Phone:  [phone]   Fax:  [phone]  Patient suicidal thoughts and angry  was not seen in office.   Patient was going to [facility] for assistance and reestablish care.

## 2021-09-14 ENCOUNTER — Other Ambulatory Visit (HOSPITAL_COMMUNITY): Payer: Self-pay

## 2021-09-15 ENCOUNTER — Other Ambulatory Visit (HOSPITAL_COMMUNITY): Payer: Self-pay

## 2021-09-19 ENCOUNTER — Ambulatory Visit: Payer: Medicare HMO | Admitting: Nurse Practitioner

## 2021-09-19 ENCOUNTER — Other Ambulatory Visit (HOSPITAL_COMMUNITY): Payer: Self-pay

## 2021-09-21 DIAGNOSIS — M5430 Sciatica, unspecified side: Secondary | ICD-10-CM | POA: Diagnosis not present

## 2021-09-21 DIAGNOSIS — I10 Essential (primary) hypertension: Secondary | ICD-10-CM | POA: Diagnosis not present

## 2021-09-21 DIAGNOSIS — Z79899 Other long term (current) drug therapy: Secondary | ICD-10-CM | POA: Diagnosis not present

## 2021-09-21 DIAGNOSIS — F17210 Nicotine dependence, cigarettes, uncomplicated: Secondary | ICD-10-CM | POA: Diagnosis not present

## 2021-09-21 DIAGNOSIS — E119 Type 2 diabetes mellitus without complications: Secondary | ICD-10-CM | POA: Diagnosis not present

## 2021-09-21 DIAGNOSIS — F17200 Nicotine dependence, unspecified, uncomplicated: Secondary | ICD-10-CM | POA: Diagnosis not present

## 2021-09-21 DIAGNOSIS — M48061 Spinal stenosis, lumbar region without neurogenic claudication: Secondary | ICD-10-CM | POA: Diagnosis not present

## 2021-09-21 DIAGNOSIS — E7800 Pure hypercholesterolemia, unspecified: Secondary | ICD-10-CM | POA: Diagnosis not present

## 2021-09-21 DIAGNOSIS — R350 Frequency of micturition: Secondary | ICD-10-CM | POA: Diagnosis not present

## 2021-09-21 DIAGNOSIS — M4727 Other spondylosis with radiculopathy, lumbosacral region: Secondary | ICD-10-CM | POA: Diagnosis not present

## 2021-09-23 ENCOUNTER — Telehealth (HOSPITAL_COMMUNITY): Payer: Self-pay | Admitting: Nurse Practitioner

## 2021-09-23 NOTE — BH Assessment (Signed)
Care Management - [place] Follow Up Discharges   Writer attempted to make contact with patient today and was unsuccessful.  Phone just rang, unable to leave a message.   Per chart review, patient was provided with outpatient substance abuse resources.

## 2021-10-05 ENCOUNTER — Telehealth: Payer: Self-pay | Admitting: Neurology

## 2021-10-05 DIAGNOSIS — B9689 Other specified bacterial agents as the cause of diseases classified elsewhere: Secondary | ICD-10-CM | POA: Diagnosis not present

## 2021-10-05 DIAGNOSIS — Z1159 Encounter for screening for other viral diseases: Secondary | ICD-10-CM | POA: Diagnosis not present

## 2021-10-05 DIAGNOSIS — R300 Dysuria: Secondary | ICD-10-CM | POA: Diagnosis not present

## 2021-10-05 DIAGNOSIS — E119 Type 2 diabetes mellitus without complications: Secondary | ICD-10-CM | POA: Diagnosis not present

## 2021-10-05 DIAGNOSIS — M5430 Sciatica, unspecified side: Secondary | ICD-10-CM | POA: Diagnosis not present

## 2021-10-05 DIAGNOSIS — Z79899 Other long term (current) drug therapy: Secondary | ICD-10-CM | POA: Diagnosis not present

## 2021-10-05 DIAGNOSIS — E612 Magnesium deficiency: Secondary | ICD-10-CM | POA: Diagnosis not present

## 2021-10-05 DIAGNOSIS — M129 Arthropathy, unspecified: Secondary | ICD-10-CM | POA: Diagnosis not present

## 2021-10-05 DIAGNOSIS — M4727 Other spondylosis with radiculopathy, lumbosacral region: Secondary | ICD-10-CM | POA: Diagnosis not present

## 2021-10-05 DIAGNOSIS — E559 Vitamin D deficiency, unspecified: Secondary | ICD-10-CM | POA: Diagnosis not present

## 2021-10-05 DIAGNOSIS — M48061 Spinal stenosis, lumbar region without neurogenic claudication: Secondary | ICD-10-CM | POA: Diagnosis not present

## 2021-10-05 NOTE — Telephone Encounter (Signed)
I tried to call the patient to reschedule and I was not able to connect with her or leave a voice mail. I will send a mychart message. Dr. Brett Fairy will be out of the office tomorrow, so I am moving the patient's appointment to 3/3 at 8:15am. Left her on the wait list.

## 2021-10-06 ENCOUNTER — Ambulatory Visit: Payer: Medicare Other | Admitting: Neurology

## 2021-10-06 DIAGNOSIS — Z79899 Other long term (current) drug therapy: Secondary | ICD-10-CM | POA: Diagnosis not present

## 2021-10-17 ENCOUNTER — Other Ambulatory Visit (HOSPITAL_COMMUNITY): Payer: Self-pay

## 2021-10-17 ENCOUNTER — Other Ambulatory Visit: Payer: Self-pay | Admitting: Nurse Practitioner

## 2021-10-17 DIAGNOSIS — R110 Nausea: Secondary | ICD-10-CM

## 2021-10-17 MED ORDER — POTASSIUM CHLORIDE CRYS ER 20 MEQ PO TBCR
20.0000 meq | EXTENDED_RELEASE_TABLET | Freq: Every day | ORAL | 0 refills | Status: DC
  Filled 2021-10-17: qty 30, 30d supply, fill #0

## 2021-10-21 ENCOUNTER — Ambulatory Visit: Payer: Medicare Other | Admitting: Neurology

## 2021-10-24 ENCOUNTER — Ambulatory Visit: Payer: Medicaid Other | Admitting: Nurse Practitioner

## 2021-11-04 DIAGNOSIS — Z79899 Other long term (current) drug therapy: Secondary | ICD-10-CM | POA: Diagnosis not present

## 2021-11-06 ENCOUNTER — Encounter (HOSPITAL_COMMUNITY): Payer: Self-pay

## 2021-11-06 ENCOUNTER — Emergency Department (HOSPITAL_COMMUNITY)
Admission: EM | Admit: 2021-11-06 | Discharge: 2021-11-06 | Disposition: A | Discharge status: Home / Self Care | Payer: Medicare Other | Attending: Emergency Medicine | Admitting: Emergency Medicine

## 2021-11-06 ENCOUNTER — Other Ambulatory Visit: Payer: Self-pay

## 2021-11-06 ENCOUNTER — Emergency Department (HOSPITAL_COMMUNITY): Payer: Medicare Other

## 2021-11-06 DIAGNOSIS — R1031 Right lower quadrant pain: Secondary | ICD-10-CM | POA: Insufficient documentation

## 2021-11-06 DIAGNOSIS — R112 Nausea with vomiting, unspecified: Secondary | ICD-10-CM | POA: Insufficient documentation

## 2021-11-06 DIAGNOSIS — Z7951 Long term (current) use of inhaled steroids: Secondary | ICD-10-CM | POA: Diagnosis not present

## 2021-11-06 DIAGNOSIS — R1030 Lower abdominal pain, unspecified: Secondary | ICD-10-CM | POA: Diagnosis not present

## 2021-11-06 DIAGNOSIS — E119 Type 2 diabetes mellitus without complications: Secondary | ICD-10-CM | POA: Insufficient documentation

## 2021-11-06 DIAGNOSIS — Y900 Blood alcohol level of less than 20 mg/100 ml: Secondary | ICD-10-CM | POA: Insufficient documentation

## 2021-11-06 DIAGNOSIS — E876 Hypokalemia: Secondary | ICD-10-CM | POA: Insufficient documentation

## 2021-11-06 DIAGNOSIS — I700 Atherosclerosis of aorta: Secondary | ICD-10-CM | POA: Diagnosis not present

## 2021-11-06 DIAGNOSIS — I10 Essential (primary) hypertension: Secondary | ICD-10-CM | POA: Insufficient documentation

## 2021-11-06 DIAGNOSIS — R197 Diarrhea, unspecified: Secondary | ICD-10-CM | POA: Insufficient documentation

## 2021-11-06 DIAGNOSIS — J45909 Unspecified asthma, uncomplicated: Secondary | ICD-10-CM | POA: Insufficient documentation

## 2021-11-06 DIAGNOSIS — R1032 Left lower quadrant pain: Secondary | ICD-10-CM | POA: Insufficient documentation

## 2021-11-06 DIAGNOSIS — R9431 Abnormal electrocardiogram [ECG] [EKG]: Secondary | ICD-10-CM | POA: Diagnosis not present

## 2021-11-06 DIAGNOSIS — Z79899 Other long term (current) drug therapy: Secondary | ICD-10-CM | POA: Diagnosis not present

## 2021-11-06 DIAGNOSIS — Z7984 Long term (current) use of oral hypoglycemic drugs: Secondary | ICD-10-CM | POA: Diagnosis not present

## 2021-11-06 LAB — COMPREHENSIVE METABOLIC PANEL
ALT: 19 U/L (ref 0–44)
AST: 21 U/L (ref 15–41)
Albumin: 4.5 g/dL (ref 3.5–5.0)
Alkaline Phosphatase: 82 U/L (ref 38–126)
Anion gap: 13 (ref 5–15)
BUN: 18 mg/dL (ref 6–20)
CO2: 24 mmol/L (ref 22–32)
Calcium: 9.2 mg/dL (ref 8.9–10.3)
Chloride: 103 mmol/L (ref 98–111)
Creatinine, Ser: 0.92 mg/dL (ref 0.44–1.00)
GFR, Estimated: >60 mL/min (ref >60)
Glucose, Bld: 163 mg/dL — ABNORMAL HIGH (ref 70–99)
Potassium: 3.2 mmol/L — ABNORMAL LOW (ref 3.5–5.1)
Sodium: 140 mmol/L (ref 135–145)
Total Bilirubin: 0.5 mg/dL (ref 0.3–1.2)
Total Protein: 8.2 g/dL — ABNORMAL HIGH (ref 6.5–8.1)

## 2021-11-06 LAB — RAPID URINE DRUG SCREEN, HOSP PERFORMED
Amphetamines: NOT DETECTED
Barbiturates: NOT DETECTED
Benzodiazepines: NOT DETECTED
Cocaine: NOT DETECTED
Opiates: NOT DETECTED
Tetrahydrocannabinol: NOT DETECTED

## 2021-11-06 LAB — URINALYSIS, ROUTINE W REFLEX MICROSCOPIC
Bilirubin Urine: NEGATIVE
Glucose, UA: 100 mg/dL — AB
Hgb urine dipstick: NEGATIVE
Ketones, ur: NEGATIVE mg/dL
Leukocytes,Ua: NEGATIVE
Nitrite: NEGATIVE
Specific Gravity, Urine: <1.005 — ABNORMAL LOW (ref 1.005–1.030)
pH: 7.5 (ref 5.0–8.0)

## 2021-11-06 LAB — CBC
HCT: 47.1 % — ABNORMAL HIGH (ref 36.0–46.0)
Hemoglobin: 16 g/dL — ABNORMAL HIGH (ref 12.0–15.0)
MCH: 30.7 pg (ref 26.0–34.0)
MCHC: 34 g/dL (ref 30.0–36.0)
MCV: 90.4 fL (ref 80.0–100.0)
Platelets: 186 10*3/uL (ref 150–400)
RBC: 5.21 MIL/uL — ABNORMAL HIGH (ref 3.87–5.11)
RDW: 11.8 % (ref 11.5–15.5)
WBC: 9.1 10*3/uL (ref 4.0–10.5)
nRBC: 0 % (ref 0.0–0.2)

## 2021-11-06 LAB — URINALYSIS, MICROSCOPIC (REFLEX)

## 2021-11-06 LAB — MAGNESIUM: Magnesium: 1.5 mg/dL — ABNORMAL LOW (ref 1.7–2.4)

## 2021-11-06 LAB — LIPASE, BLOOD: Lipase: 34 U/L (ref 11–51)

## 2021-11-06 LAB — ETHANOL: Alcohol, Ethyl (B): <10 mg/dL (ref <10)

## 2021-11-06 MED ORDER — ONDANSETRON HCL 4 MG/2ML IJ SOLN
4.0000 mg | Freq: Once | INTRAMUSCULAR | Status: AC
  Administered 2021-11-06: 4 mg via INTRAVENOUS
  Filled 2021-11-06: qty 2

## 2021-11-06 MED ORDER — POTASSIUM CHLORIDE CRYS ER 20 MEQ PO TBCR
40.0000 meq | EXTENDED_RELEASE_TABLET | Freq: Once | ORAL | Status: AC
  Administered 2021-11-06: 40 meq via ORAL
  Filled 2021-11-06: qty 2

## 2021-11-06 MED ORDER — LIDOCAINE VISCOUS HCL 2 % MT SOLN
15.0000 mL | Freq: Once | OROMUCOSAL | Status: AC
  Administered 2021-11-06: 15 mL via ORAL
  Filled 2021-11-06: qty 15

## 2021-11-06 MED ORDER — POTASSIUM CHLORIDE CRYS ER 20 MEQ PO TBCR
20.0000 meq | EXTENDED_RELEASE_TABLET | Freq: Two times a day (BID) | ORAL | 0 refills | Status: DC
  Filled 2021-11-06: qty 10, 5d supply, fill #0

## 2021-11-06 MED ORDER — ALUM & MAG HYDROXIDE-SIMETH 200-200-20 MG/5ML PO SUSP
30.0000 mL | Freq: Once | ORAL | Status: AC
  Administered 2021-11-06: 30 mL via ORAL
  Filled 2021-11-06: qty 30

## 2021-11-06 MED ORDER — LACTATED RINGERS IV BOLUS
1000.0000 mL | Freq: Once | INTRAVENOUS | Status: AC
  Administered 2021-11-06: 1000 mL via INTRAVENOUS

## 2021-11-06 MED ORDER — FENTANYL CITRATE PF 50 MCG/ML IJ SOSY
100.0000 ug | PREFILLED_SYRINGE | Freq: Once | INTRAMUSCULAR | Status: AC
  Administered 2021-11-06: 100 ug via INTRAVENOUS
  Filled 2021-11-06: qty 2

## 2021-11-06 MED ORDER — IOHEXOL 300 MG/ML  SOLN
100.0000 mL | Freq: Once | INTRAMUSCULAR | Status: AC | PRN
  Administered 2021-11-06: 100 mL via INTRAVENOUS

## 2021-11-06 MED ORDER — ONDANSETRON 4 MG PO TBDP: 4.0000 mg | ORAL_TABLET | Freq: Three times a day (TID) | ORAL | 0 refills | Status: DC | PRN

## 2021-11-06 MED ORDER — ONDANSETRON 4 MG PO TBDP
4.0000 mg | ORAL_TABLET | Freq: Once | ORAL | Status: AC | PRN
Start: 2021-11-06 — End: 2021-11-06
  Administered 2021-11-06: 4 mg via ORAL
  Filled 2021-11-06: qty 1

## 2021-11-06 MED ORDER — POTASSIUM CHLORIDE CRYS ER 20 MEQ PO TBCR: 20.0000 meq | EXTENDED_RELEASE_TABLET | Freq: Two times a day (BID) | ORAL | 0 refills | Status: DC

## 2021-11-06 MED ORDER — MAGNESIUM OXIDE -MG SUPPLEMENT 400 (240 MG) MG PO TABS
400.0000 mg | ORAL_TABLET | Freq: Once | ORAL | Status: AC
  Administered 2021-11-06: 400 mg via ORAL
  Filled 2021-11-06: qty 1

## 2021-11-06 MED ORDER — ONDANSETRON 4 MG PO TBDP
4.0000 mg | ORAL_TABLET | Freq: Three times a day (TID) | ORAL | 0 refills | Status: DC | PRN
  Filled 2021-11-06: qty 20, 7d supply, fill #0

## 2021-11-06 NOTE — ED Provider Triage Note (Signed)
Emergency Medicine Provider Triage Evaluation Note ? ?Chalmers Cater , a 46 y.o. female  was evaluated in triage.  Pt complains of abdominal pain, nausea, and vomiting.  Patient reports that symptoms started yesterday.  Pain is located across lower abdomen.  Patient endorses vomiting 10 times in the last 24 hours.  Patient describes emesis as stomach contents and bilious.  Additionally patient endorses diarrhea. ? ?Denies any recent alcohol or illicit drug use. ? ?Review of Systems  ?Positive: Vomiting, nausea, vomiting, diarrhea, chills ?Negative: Fever, constipation, blood in stool, melena, dysuria, hematuria, urinary urgency, vaginal pain, vaginal bleeding, vaginal discharge ? ?Physical Exam  ?BP (!) 159/123 (BP Location: Left Arm)   Pulse (!) 107   Temp 98.4 ?F (36.9 ?C) (Oral)   Resp 18   Ht '5\' 3"'$  (1.6 m)   Wt 77.1 kg   LMP 09/14/2019 (Approximate)   SpO2 99%   BMI 30.11 kg/m?  ?Gen:   Awake, no distress   ?Resp:  Normal effort  ?MSK:   Moves extremities without difficulty  ?Other:  Abdomen soft, nondistended, minimal tenderness across lower abdomen, no guarding or rebound tenderness. ? ?Medical Decision Making  ?Medically screening exam initiated at 10:32 AM.  Appropriate orders placed.  Chalmers Cater was informed that the remainder of the evaluation will be completed by another provider, this initial triage assessment does not replace that evaluation, and the importance of remaining in the ED until their evaluation is complete. ? ?Due to complaints of abdominal pain will order CMP, CBC, lipase and urinalysis at this time.  Due to patient's history of polysubstance use we will add on UDS and ethanol. ?  ?Loni Beckwith, PA-C ?11/06/21 1033 ? ?

## 2021-11-06 NOTE — ED Triage Notes (Signed)
Patient c/o abdominal pain, N/V/D x 2 days. ?

## 2021-11-06 NOTE — ED Notes (Signed)
Pt in CT SCAN 

## 2021-11-06 NOTE — ED Provider Notes (Signed)
?  Physical Exam  ?BP (!) 123/91   Pulse 77   Temp 98.4 ?F (36.9 ?C) (Oral)   Resp 13   Ht '5\' 3"'$  (1.6 m)   Wt 77.1 kg   LMP 09/14/2019 (Approximate)   SpO2 98%   BMI 30.11 kg/m?  ? ?Physical Exam ? ?Procedures  ?Procedures ? ?ED Course / MDM  ?  ?Medical Decision Making ?Amount and/or Complexity of Data Reviewed ?Labs: ordered. ?Radiology: ordered. ? ?Risk ?OTC drugs. ?Prescription drug management. ? ? ?Care of this patient assumed from preceding ED provider Debbe Mounts, PA-C.  Please see his associated note for further insight of the patient's ED course.  In brief patient with history of hypertension, diabetes, asthma, pseudotumor cerebri, and GERD as well as polysubstance abuse.  She presents today with concern for bilateral lower abdominal pain that does not radiate.  Endorses 10 episodes of nausea and vomiting NBNB emesis for the last 24 hours. ? ?CBC without leukocytosis and with elevated hemoglobin of 16.  CMP with mild hypokalemia of 3.2 and hyperglycemia.  Magnesium is low at 1.5.  Lipase and ethanol levels are normal. ? ?CT of the abdomen pelvis negative for acute intra-abdominal pelvic abnormality.  She does have hepatomegaly. ? ?Patient has been administered LR bolus and low-dose nausea and pain medication.  Symptomatically she is significantly improved.  Potassium and magnesium have been repleted at this time orally.  At time of shift change she is pending urinalysis. ? ?UA without evidence of infection though there is trace proteinuria.  Outpatient follow-up in the outpatient setting for this. ?Patient reevaluated.  Nausea has returned after she quickly ate an entire food tray from the cafeteria.  Abdominal exam is benign at this time.  Will offer ODT Zofran at time of discharge and discharged home with prescription for Zofran and potassium repletion as discussed with preceding ED provider. ? ?Ms. Alcon voiced understanding of her medical evaluation and treatment plan.  Each of her  questions was answered to his expressed satisfaction.  She was well-appearing, stable, and was discharged in good condition. ? ?This chart was dictated using voice recognition software, Dragon. Despite the best efforts of this provider to proofread and correct errors, errors may still occur which can change documentation meaning. ? ? ? ?  ?Emeline Darling, PA-C ?11/06/21 1829 ? ?  ?Milton Ferguson, MD ?11/07/21 1355 ? ?

## 2021-11-06 NOTE — ED Notes (Signed)
Patient back from CT scan. JRPRN ?

## 2021-11-06 NOTE — ED Provider Notes (Signed)
?[facility] DEPT ?Provider Note ? ? ?CSN: [id number] ?Arrival date & time: 11/06/21  6712 ? ?  ? ?History ? ?Chief Complaint  ?Patient presents with  ? Abdominal Pain  ? Emesis  ? Diarrhea  ? ? ?Leah Barber is a 46 y.o. female with a past medical history of diabetes mellitus, hypertension, hyperlipidemia, anxiety, asthma, pseudotumor cerebri, GERD, polysubstance use, status post total hysterectomy.  Presents to the emergency department with a chief complaint of abdominal pain, nausea, vomiting, and diarrhea.  Patient reports that her symptoms started yesterday.  Symptoms have been constant since then.  Pain is located to bilateral lower abdomen.  Does not radiate.  Patient rates pain 7/10 on the pain scale.  Patient reports that pain is worse with touch.  She endorses 10 episodes of vomiting last 24 hours.  Describes emesis as stomach contents and bilious.  Patient reported taking sildenafil at home with no relief of symptoms as she quickly vomited after taking this medication. ? ?Patient endorses being sexually active and initially monogamous relationship with a female partner.  Patient endorses occasional marijuana use, declines any recently.  Denies any recent ethanol use. ? ?Patient denies any fevers, abdominal distention, constipation, blood in stool, nausea, dysuria, hematuria, urinary urgency, vaginal pain, vaginal bleeding, vaginal discharge, lightheadedness, syncope. ? ? ?Abdominal Pain ?Associated symptoms: diarrhea, nausea and vomiting   ?Associated symptoms: no chest pain, no chills, no constipation, no dysuria, no fever, no hematuria, no shortness of breath, no vaginal bleeding and no vaginal discharge   ?Emesis ?Associated symptoms: abdominal pain and diarrhea   ?Associated symptoms: no chills, no fever and no headaches   ?Diarrhea ?Associated symptoms: abdominal pain and vomiting   ?Associated symptoms: no chills, no fever and no headaches   ? ?  ? ?Home  Medications ?Prior to Admission medications   ?Medication Sig Start Date End Date Taking? Authorizing Provider  ?Albuterol Sulfate (PROAIR RESPICLICK) 458 (90 Base) MCG/ACT AEPB Inhale 2 puffs into the lungs 4 (four) times daily as needed. 05/17/20 05/17/21  [name], NP  ?amLODipine (NORVASC) 10 MG tablet Take 1 tablet (10 mg total) by mouth daily. 07/25/21 01/21/22  [name], NP  ?buprenorphine (SUBUTEX) 8 MG SUBL SL tablet Place 8 mg under the tongue every 8 (eight) hours. 03/31/20   [provider]  ?cetirizine (ZYRTEC) 10 MG tablet TAKE 1 TABLET BY MOUTH EVERY DAY 04/11/21   [name], NP  ?clonazePAM (KLONOPIN) 0.5 MG tablet  02/02/21   [provider]  ?cloNIDine (CATAPRES) 0.1 MG tablet Take 1 tablet (0.1 mg total) by mouth 3 (three) times daily. 07/25/21 01/21/22  [name], NP  ?CONCERTA 18 MG CR tablet Take 18 mg by mouth daily as needed. 05/17/20   [provider]  ?DULoxetine (CYMBALTA) 60 MG capsule Take 1 capsule (60 mg total) by mouth 2 (two) times daily. 07/25/21 01/21/22  [name], NP  ?fluticasone (FLONASE) 50 MCG/ACT nasal spray Place 2 sprays into both nostrils daily. 07/25/21   [name], NP  ?furosemide (LASIX) 20 MG tablet Take 1 tablet (20 mg total) by mouth daily. 07/25/21 01/21/22  [name], NP  ?gabapentin (NEURONTIN) 800 MG tablet Take 1 tablet (800 mg total) by mouth every 8 (eight) hours. 07/25/21 01/21/22  [name], NP  ?hydrOXYzine (VISTARIL) 50 MG capsule Take 1 capsule (50 mg total) by mouth 4 (four) times daily as needed for anxiety or nausea. 09/08/21  [name], PA-C  ?ibuprofen (ADVIL) 800 MG tablet Take 1 tablet (800 mg total) by mouth every 8 (eight) hours as needed. 07/25/21 01/21/22  [name], NP  ?latanoprost (XALATAN) 0.005 % ophthalmic solution Place 1 drop into both eyes at bedtime. 07/25/21   [name], NP  ?Lidocaine (HM LIDOCAINE PATCH) 4 % PTCH Apply 1 patch topically daily. 06/20/21   Bo Merino I, NP  ?metFORMIN (GLUCOPHAGE) 500 MG tablet Take 1 tablet (500 mg total) by mouth 2 (two) times daily with a meal. 07/25/21 01/21/22  [name], NP  ?Multiple Vitamins-Minerals (WOMENS MULTIVITAMIN PO) Take 1 tablet by mouth daily.     [provider]  ?omeprazole (PRILOSEC) 40 MG capsule Take 1 capsule (40 mg total) by mouth daily. 07/25/21 01/21/22  [name], NP  ?ondansetron (ZOFRAN) 4 MG tablet TAKE 1 TABLET (4 MG TOTAL) BY MOUTH EVERY 6 (SIX) HOURS AS NEEDED FOR NAUSEA OR VOMITING 10/17/21   [name], NP  ?potassium chloride SA (KLOR-CON M) 20 MEQ tablet Take 1 tablet  by mouth daily. 10/17/21 11/16/21  [name], NP  ?rosuvastatin (CRESTOR) 5 MG tablet Take 1 tablet (5 mg total) by mouth daily. 07/25/21 01/21/22  [name], NP  ?zolpidem (AMBIEN) 5 MG tablet Take 1 tablet (5 mg total) by mouth at bedtime as needed for up to 5 days for sleep. 03/23/20 03/28/20  Dorena Dew, FNP  ?   ? ?Allergies    ?Clindamycin/lincomycin, Other, Hydrocodone, and Morphine   ? ?Review of Systems   ?Review of Systems  ?Constitutional:  Negative for chills and fever.  ?Eyes:  Negative for visual disturbance.  ?Respiratory:  Negative for shortness of breath.   ?Cardiovascular:  Negative for chest pain.  ?Gastrointestinal:  Positive for abdominal pain, diarrhea, nausea and vomiting. Negative for abdominal distention, anal bleeding, blood in stool, constipation and rectal pain.  ?Genitourinary:  Negative for difficulty urinating, dysuria, flank pain, frequency, genital sores, hematuria, pelvic pain, urgency, vaginal bleeding, vaginal discharge and vaginal pain.  ?Musculoskeletal:  Negative for back pain and neck pain.  ?Skin:  Negative for color change and rash.  ?Neurological:  Negative for dizziness, syncope, light-headedness and headaches.  ?Psychiatric/Behavioral:  Negative for confusion.   ? ?Physical Exam ?Updated Vital Signs ?BP 100/70 (BP Location: Left Arm)   Pulse 92   Temp 98.4 ?F  (36.9 ?C) (Oral)   Resp 17   Ht '5\' 3"'$  (1.6 m)   Wt 77.1 kg   LMP 09/14/2019 (Approximate)   SpO2 92%   BMI 30.11 kg/m?  ?Physical Exam ?Vitals and nursing note reviewed.  ?Constitutional:   ?   General: She is not in acute distress. ?   Appearance: She is not ill-appearing, toxic-appearing or diaphoretic.  ?HENT:  ?   Head: Normocephalic.  ?Eyes:  ?   General: No scleral icterus.    ?   Right eye: No discharge.     ?   Left eye: No discharge.  ?Cardiovascular:  ?   Rate and Rhythm: Normal rate.  ?Pulmonary:  ?   Effort: Pulmonary effort is normal.  ?Abdominal:  ?   General: Abdomen is flat. A surgical scar is present. Bowel sounds are normal. There is no distension. There are no signs of injury.  ?   Palpations: Abdomen is soft. There is no mass or pulsatile mass.  ?   Tenderness: There is abdominal tenderness in the right lower quadrant and left  lower quadrant. There is no right CVA tenderness, left CVA tenderness, guarding or rebound.  ?   Hernia: There is no hernia in the umbilical area or ventral area.  ?Skin: ?   General: Skin is warm and dry.  ?Neurological:  ?   General: No focal deficit present.  ?   Mental Status: She is alert.  ?Psychiatric:     ?   Behavior: Behavior is cooperative.  ? ? ?ED Results / Procedures / Treatments   ?Labs ?(all labs ordered are listed, but only abnormal results are displayed) ?Labs Reviewed  ?COMPREHENSIVE METABOLIC PANEL - Abnormal; Notable for the following components:  ?    Result Value  ? Potassium 3.2 (*)   ? Glucose, Bld 163 (*)   ? Total Protein 8.2 (*)   ? All other components within normal limits  ?CBC - Abnormal; Notable for the following components:  ? RBC 5.21 (*)   ? Hemoglobin 16.0 (*)   ? HCT 47.1 (*)   ? All other components within normal limits  ?MAGNESIUM - Abnormal; Notable for the following components:  ? Magnesium 1.5 (*)   ? All other components within normal limits  ?RESP PANEL BY RT-PCR (FLU A&B, COVID) ARPGX2  ?LIPASE, BLOOD  ?ETHANOL   ?URINALYSIS, ROUTINE W REFLEX MICROSCOPIC  ?RAPID URINE DRUG SCREEN, HOSP PERFORMED  ? ? ?EKG ?None ? ?Radiology ?CT ABDOMEN PELVIS W CONTRAST ? ?Result Date: 11/06/2021 ?CLINICAL DATA:  Right lower quadrant pain EXAM: CT ABDOMEN AND PELVI

## 2021-11-06 NOTE — Discharge Instructions (Addendum)
You came to the emergency department today to be evaluated for your nausea, vomiting, abdominal pain and diarrhea.  The CT scan of your abdomen pelvis showed no acute abnormalities.  Your lab results show that your potassium is slightly decreased.  Due to your potassium being lowered I have given you prescription for this medication.  Please take it as prescribed.  You will need to follow-up with your primary care provider for repeat assessment of your potassium levels later this week.  Additionally have given you prescription for Zofran that dissolves under your tongue to take as needed for your nausea and vomiting. ? ?Get help right away if: ?Your pain does not go away as soon as your health care provider told you to expect. ?You cannot stop vomiting. ?Your pain is only in areas of the abdomen, such as the right side or the left lower portion of the abdomen. Pain on the right side could be caused by appendicitis. ?You have bloody or black stools, or stools that look like tar. ?You have severe pain, cramping, or bloating in your abdomen. ?You have signs of dehydration, such as: ?Dark urine, very little urine, or no urine. ?Cracked lips. ?Dry mouth. ?Sunken eyes. ?Sleepiness. ?Weakness. ?You have trouble breathing or chest pain. ?

## 2021-11-06 NOTE — ED Notes (Signed)
Pt had a meal tray and a cup of soda done well ?

## 2021-11-06 NOTE — ED Notes (Signed)
Unable to get blood work °

## 2021-11-07 ENCOUNTER — Other Ambulatory Visit (HOSPITAL_COMMUNITY): Payer: Self-pay

## 2021-11-11 ENCOUNTER — Other Ambulatory Visit (HOSPITAL_COMMUNITY): Payer: Self-pay

## 2021-11-11 DIAGNOSIS — F32A Depression, unspecified: Secondary | ICD-10-CM | POA: Diagnosis not present

## 2021-11-11 DIAGNOSIS — Z79899 Other long term (current) drug therapy: Secondary | ICD-10-CM | POA: Diagnosis not present

## 2021-11-12 ENCOUNTER — Other Ambulatory Visit (HOSPITAL_COMMUNITY): Payer: Self-pay

## 2021-11-15 DIAGNOSIS — Z79899 Other long term (current) drug therapy: Secondary | ICD-10-CM | POA: Diagnosis not present

## 2021-11-18 ENCOUNTER — Other Ambulatory Visit (HOSPITAL_COMMUNITY): Payer: Self-pay

## 2021-11-28 ENCOUNTER — Telehealth: Payer: Self-pay

## 2021-11-28 NOTE — Telephone Encounter (Signed)
gabapentin

## 2021-11-28 NOTE — Telephone Encounter (Signed)
Patient will need to call her pharmacy to se if she still has refills. ?

## 2021-11-29 ENCOUNTER — Other Ambulatory Visit (HOSPITAL_COMMUNITY): Payer: Self-pay

## 2021-11-30 ENCOUNTER — Ambulatory Visit: Payer: Self-pay | Admitting: Nurse Practitioner

## 2021-11-30 ENCOUNTER — Other Ambulatory Visit (HOSPITAL_COMMUNITY): Payer: Self-pay

## 2021-12-01 ENCOUNTER — Other Ambulatory Visit (HOSPITAL_COMMUNITY): Payer: Self-pay

## 2021-12-01 MED ORDER — GABAPENTIN 800 MG PO TABS
ORAL_TABLET | ORAL | 0 refills | Status: DC
  Filled 2021-12-01: qty 12, 4d supply, fill #0
  Filled 2021-12-01: qty 90, 90d supply, fill #0
  Filled 2021-12-05: qty 78, 26d supply, fill #1

## 2021-12-01 MED ORDER — AMLODIPINE BESYLATE 10 MG PO TABS
ORAL_TABLET | ORAL | 3 refills | Status: DC
  Filled 2021-12-01: qty 90, 90d supply, fill #0

## 2021-12-05 ENCOUNTER — Other Ambulatory Visit (HOSPITAL_COMMUNITY): Payer: Self-pay

## 2021-12-06 ENCOUNTER — Other Ambulatory Visit: Payer: Self-pay | Admitting: Nurse Practitioner

## 2021-12-06 ENCOUNTER — Other Ambulatory Visit (HOSPITAL_COMMUNITY): Payer: Self-pay

## 2021-12-07 ENCOUNTER — Other Ambulatory Visit (HOSPITAL_COMMUNITY): Payer: Self-pay

## 2021-12-07 DIAGNOSIS — Z1152 Encounter for screening for COVID-19: Secondary | ICD-10-CM | POA: Diagnosis not present

## 2021-12-07 MED ORDER — FUROSEMIDE 20 MG PO TABS
20.0000 mg | ORAL_TABLET | Freq: Every day | ORAL | 5 refills | Status: DC
  Filled 2021-12-07: qty 30, 30d supply, fill #0
  Filled 2022-01-25: qty 30, 30d supply, fill #1
  Filled 2022-02-24: qty 30, 30d supply, fill #2
  Filled 2022-04-03: qty 30, 30d supply, fill #3
  Filled 2022-05-05: qty 30, 30d supply, fill #4
  Filled 2022-06-05: qty 30, 30d supply, fill #5

## 2021-12-08 ENCOUNTER — Other Ambulatory Visit (HOSPITAL_COMMUNITY): Payer: Self-pay

## 2021-12-08 DIAGNOSIS — M129 Arthropathy, unspecified: Secondary | ICD-10-CM | POA: Diagnosis not present

## 2021-12-08 DIAGNOSIS — Z0130 Encounter for examination of blood pressure without abnormal findings: Secondary | ICD-10-CM | POA: Diagnosis not present

## 2021-12-08 DIAGNOSIS — G8929 Other chronic pain: Secondary | ICD-10-CM | POA: Diagnosis not present

## 2021-12-08 DIAGNOSIS — I10 Essential (primary) hypertension: Secondary | ICD-10-CM | POA: Diagnosis not present

## 2021-12-08 DIAGNOSIS — M549 Dorsalgia, unspecified: Secondary | ICD-10-CM | POA: Diagnosis not present

## 2021-12-08 DIAGNOSIS — F32A Depression, unspecified: Secondary | ICD-10-CM | POA: Diagnosis not present

## 2021-12-08 DIAGNOSIS — Z79899 Other long term (current) drug therapy: Secondary | ICD-10-CM | POA: Diagnosis not present

## 2021-12-08 MED ORDER — FLUTICASONE PROPIONATE 50 MCG/ACT NA SUSP
NASAL | 0 refills | Status: DC
  Filled 2021-12-08: qty 16, 60d supply, fill #0

## 2021-12-08 MED ORDER — IBUPROFEN 800 MG PO TABS
800.0000 mg | ORAL_TABLET | Freq: Three times a day (TID) | ORAL | 0 refills | Status: DC | PRN
  Filled 2021-12-08: qty 90, 30d supply, fill #0

## 2021-12-08 MED ORDER — DULOXETINE HCL 30 MG PO CPEP
30.0000 mg | ORAL_CAPSULE | Freq: Every day | ORAL | 0 refills | Status: DC
  Filled 2021-12-08: qty 30, 30d supply, fill #0

## 2021-12-08 MED ORDER — AMLODIPINE BESYLATE 10 MG PO TABS
10.0000 mg | ORAL_TABLET | Freq: Every day | ORAL | 0 refills | Status: DC
  Filled 2021-12-08: qty 30, 30d supply, fill #0

## 2021-12-08 MED ORDER — FUROSEMIDE 20 MG PO TABS
20.0000 mg | ORAL_TABLET | Freq: Every day | ORAL | 0 refills | Status: DC
  Filled 2021-12-08: qty 30, 30d supply, fill #0

## 2021-12-08 MED ORDER — LATANOPROST 0.005 % OP SOLN
OPHTHALMIC | 0 refills | Status: DC
  Filled 2021-12-08: qty 2.5, 25d supply, fill #0

## 2021-12-08 MED ORDER — BUPRENORPHINE HCL-NALOXONE HCL 8-2 MG SL FILM
ORAL_FILM | SUBLINGUAL | 0 refills | Status: DC
  Filled 2021-12-08: qty 21, 7d supply, fill #0

## 2021-12-12 ENCOUNTER — Other Ambulatory Visit (HOSPITAL_COMMUNITY): Payer: Self-pay

## 2021-12-13 DIAGNOSIS — Z79899 Other long term (current) drug therapy: Secondary | ICD-10-CM | POA: Diagnosis not present

## 2021-12-15 ENCOUNTER — Other Ambulatory Visit: Payer: Self-pay | Admitting: Nurse Practitioner

## 2021-12-15 ENCOUNTER — Other Ambulatory Visit (HOSPITAL_COMMUNITY): Payer: Self-pay

## 2021-12-15 ENCOUNTER — Other Ambulatory Visit: Payer: Self-pay

## 2021-12-15 DIAGNOSIS — Z0130 Encounter for examination of blood pressure without abnormal findings: Secondary | ICD-10-CM | POA: Diagnosis not present

## 2021-12-15 DIAGNOSIS — H6690 Otitis media, unspecified, unspecified ear: Secondary | ICD-10-CM

## 2021-12-15 DIAGNOSIS — H409 Unspecified glaucoma: Secondary | ICD-10-CM | POA: Diagnosis not present

## 2021-12-15 DIAGNOSIS — Z79899 Other long term (current) drug therapy: Secondary | ICD-10-CM | POA: Diagnosis not present

## 2021-12-15 DIAGNOSIS — F32A Depression, unspecified: Secondary | ICD-10-CM | POA: Diagnosis not present

## 2021-12-15 MED ORDER — BUPRENORPHINE HCL-NALOXONE HCL 8-2 MG SL FILM
ORAL_FILM | SUBLINGUAL | 0 refills | Status: DC
  Filled 2021-12-15: qty 21, 7d supply, fill #0

## 2021-12-15 MED ORDER — AMLODIPINE BESYLATE 10 MG PO TABS: 10.0000 mg | ORAL_TABLET | Freq: Every day | ORAL | 0 refills | Status: DC

## 2021-12-15 MED ORDER — DULOXETINE HCL 30 MG PO CPEP: ORAL_CAPSULE | ORAL | 1 refills | Status: DC

## 2021-12-16 ENCOUNTER — Telehealth: Payer: Self-pay

## 2021-12-16 NOTE — Telephone Encounter (Signed)
? ?  Telephone encounter was:  Unsuccessful.  12/16/2021 ?Name: Leah Barber MRN: [id number] DOB: Jan 24, 1976 ? ?Unsuccessful outbound call made today to assist with:  Transportation Needs  ? ?Outreach Attempt:  1st Attempt ? ?A HIPAA compliant voice message was left requesting a return call.  Instructed patient to call back at [phone] at their earliest convenience. ? ?Hessie Knows ?Care Guide, Embedded Care Coordination ?[facility]  Care management  ?[place], [place] [address]  ?Main Phone: [phone]  E-mail: [email or web address]  ?Website: [email or web address] ? ? ?

## 2021-12-19 ENCOUNTER — Telehealth: Payer: Self-pay

## 2021-12-19 NOTE — Telephone Encounter (Signed)
? ?  Telephone encounter was:  Successful.  ?12/19/2021 ?Name: Leah Barber MRN: [id number] DOB: May 28, 1976 ? ?Leah Barber is a 46 y.o. year old female who is a primary care patient of Barbette Merino, NP . The community resource team was consulted for assistance with Transportation Needs  ? ?Care guide performed the following interventions: Patient provided with information about care guide support team and interviewed to confirm resource needs. Patient needs a ride for 5/4 and 5/24 at this time. ? ?Follow Up Plan:  Care guide will outreach resources to assist patient with transportation needs ? ?Hessie Knows ?Care Guide, Embedded Care Coordination ?[facility]  Care management  ?[place], [place] [address]  ?Main Phone: [phone]  E-mail: [email or web address]  ?Website: [email or web address] ? ? ? ?

## 2021-12-21 DIAGNOSIS — Z79899 Other long term (current) drug therapy: Secondary | ICD-10-CM | POA: Diagnosis not present

## 2021-12-22 ENCOUNTER — Other Ambulatory Visit (HOSPITAL_COMMUNITY): Payer: Self-pay

## 2021-12-22 DIAGNOSIS — I10 Essential (primary) hypertension: Secondary | ICD-10-CM | POA: Diagnosis not present

## 2021-12-22 DIAGNOSIS — Z0130 Encounter for examination of blood pressure without abnormal findings: Secondary | ICD-10-CM | POA: Diagnosis not present

## 2021-12-22 DIAGNOSIS — Z79899 Other long term (current) drug therapy: Secondary | ICD-10-CM | POA: Diagnosis not present

## 2021-12-22 DIAGNOSIS — E1169 Type 2 diabetes mellitus with other specified complication: Secondary | ICD-10-CM | POA: Diagnosis not present

## 2021-12-22 DIAGNOSIS — D751 Secondary polycythemia: Secondary | ICD-10-CM | POA: Diagnosis not present

## 2021-12-22 MED ORDER — BUPRENORPHINE HCL-NALOXONE HCL 8-2 MG SL FILM
ORAL_FILM | SUBLINGUAL | 0 refills | Status: DC
  Filled 2021-12-22: qty 45, 15d supply, fill #0

## 2021-12-22 MED ORDER — METRONIDAZOLE 500 MG PO TABS
ORAL_TABLET | ORAL | 0 refills | Status: DC
  Filled 2021-12-22: qty 14, 7d supply, fill #0

## 2021-12-22 MED ORDER — AMLODIPINE BESYLATE 10 MG PO TABS
10.0000 mg | ORAL_TABLET | Freq: Every day | ORAL | 2 refills | Status: DC
  Filled 2021-12-22: qty 30, 30d supply, fill #0

## 2021-12-23 ENCOUNTER — Telehealth: Payer: Self-pay

## 2021-12-23 ENCOUNTER — Other Ambulatory Visit (HOSPITAL_COMMUNITY): Payer: Self-pay

## 2021-12-23 MED ORDER — GLIPIZIDE 5 MG PO TABS
5.0000 mg | ORAL_TABLET | Freq: Every day | ORAL | 0 refills | Status: DC
  Filled 2021-12-23: qty 30, 30d supply, fill #0

## 2021-12-23 MED ORDER — FARXIGA 5 MG PO TABS
5.0000 mg | ORAL_TABLET | Freq: Every day | ORAL | 0 refills | Status: DC
  Filled 2021-12-23: qty 30, 30d supply, fill #0

## 2021-12-23 MED ORDER — DICLOFENAC SODIUM 50 MG PO TBEC
DELAYED_RELEASE_TABLET | ORAL | 0 refills | Status: DC
  Filled 2021-12-23: qty 90, 30d supply, fill #0

## 2021-12-23 NOTE — Telephone Encounter (Signed)
? ?  Telephone encounter was:  Unsuccessful.  12/23/2021 ?Name: Leah Barber MRN: [id number] DOB: 01/08/1976 ? ?Unsuccessful outbound call made today to assist with:  Transportation Needs  ? ?Outreach Attempt:  1st Attempt Follow up ? ?A HIPAA compliant voice message was left requesting a return call.  Instructed patient to call back at [phone] at their earliest convenience. ? ?Hessie Knows ?Care Guide, Embedded Care Coordination ?[facility]  Care management  ?[place], [place] [address]  ?Main Phone: [phone]  E-mail: [email or web address]  ?Website: [email or web address] ? ? ? ? ?

## 2021-12-24 ENCOUNTER — Other Ambulatory Visit (HOSPITAL_COMMUNITY): Payer: Self-pay

## 2021-12-26 ENCOUNTER — Other Ambulatory Visit (HOSPITAL_COMMUNITY): Payer: Self-pay

## 2021-12-26 ENCOUNTER — Telehealth: Payer: Self-pay | Admitting: Hematology and Oncology

## 2021-12-26 ENCOUNTER — Telehealth: Payer: Self-pay

## 2021-12-26 NOTE — Telephone Encounter (Signed)
? ?  Telephone encounter was:  Successful.  ?12/26/2021 ?Name: [name] MRN: [id number] DOB: 04-10-1976 ? ?Leah Barber is a 47 y.o. year old female who is a primary care patient of Barbette Merino, NP . The community resource team was consulted for assistance with Transportation Needs  ? ?Care guide performed the following interventions:  CG needed contact for [facility]. Pt provided [phone] [facility] Transportation. CG confirmed with [employer] that pt has 48 one ways rides with safe rides. CG will educate patient on how to book rides with [facility]. Pt needs ride for next appt as daughter took her to last appt on 5/4. ? ?Follow Up Plan:  Care guide will follow up with patient by phone over the next few days to inquire if [facility] Transportation document has been received and to educate patient verbally about rides as all rides need to be booked ahead 3 days in advance.  ? ?Hessie Knows ?Care Guide, Embedded Care Coordination ?[facility] Health  Care management  ?[place], [place] [address]  ?Main Phone: [phone]  E-mail: [email or web address]  ?Website: [email or web address] ? ? ? ?

## 2021-12-26 NOTE — Telephone Encounter (Signed)
Scheduled appt per 5/8 referral. Pt is aware of appt date and time. Pt is aware to arrive 15 mins prior to appt time and to bring and updated insurance card. Pt is aware of appt location.   ?

## 2021-12-27 DIAGNOSIS — Z79899 Other long term (current) drug therapy: Secondary | ICD-10-CM | POA: Diagnosis not present

## 2022-01-05 ENCOUNTER — Other Ambulatory Visit (HOSPITAL_COMMUNITY): Payer: Self-pay

## 2022-01-05 DIAGNOSIS — I10 Essential (primary) hypertension: Secondary | ICD-10-CM | POA: Diagnosis not present

## 2022-01-05 DIAGNOSIS — Z79899 Other long term (current) drug therapy: Secondary | ICD-10-CM | POA: Diagnosis not present

## 2022-01-05 DIAGNOSIS — E1169 Type 2 diabetes mellitus with other specified complication: Secondary | ICD-10-CM | POA: Diagnosis not present

## 2022-01-05 DIAGNOSIS — Z0130 Encounter for examination of blood pressure without abnormal findings: Secondary | ICD-10-CM | POA: Diagnosis not present

## 2022-01-05 DIAGNOSIS — D751 Secondary polycythemia: Secondary | ICD-10-CM | POA: Diagnosis not present

## 2022-01-05 MED ORDER — DULOXETINE HCL 30 MG PO CPEP
ORAL_CAPSULE | ORAL | 2 refills | Status: DC
  Filled 2022-01-05: qty 60, 30d supply, fill #0
  Filled 2022-12-28: qty 60, 30d supply, fill #1

## 2022-01-05 MED ORDER — GLIPIZIDE 5 MG PO TABS
5.0000 mg | ORAL_TABLET | Freq: Every day | ORAL | 2 refills | Status: DC
  Filled 2022-01-25: qty 30, 30d supply, fill #0

## 2022-01-05 MED ORDER — FARXIGA 5 MG PO TABS
5.0000 mg | ORAL_TABLET | Freq: Every day | ORAL | 2 refills | Status: DC
  Filled 2022-01-25: qty 30, 30d supply, fill #0
  Filled 2022-02-24: qty 30, 30d supply, fill #1

## 2022-01-05 MED ORDER — BUPRENORPHINE HCL-NALOXONE HCL 8-2 MG SL FILM
ORAL_FILM | SUBLINGUAL | 0 refills | Status: DC
  Filled 2022-01-05: qty 90, 30d supply, fill #0

## 2022-01-07 ENCOUNTER — Other Ambulatory Visit (HOSPITAL_COMMUNITY): Payer: Self-pay

## 2022-01-07 ENCOUNTER — Other Ambulatory Visit: Payer: Self-pay | Admitting: Nurse Practitioner

## 2022-01-07 MED ORDER — GABAPENTIN 800 MG PO TABS
ORAL_TABLET | ORAL | 0 refills | Status: DC
  Filled 2022-01-07: qty 90, 30d supply, fill #0

## 2022-01-09 ENCOUNTER — Inpatient Hospital Stay: Payer: Medicare Other | Attending: Hematology and Oncology | Admitting: Hematology and Oncology

## 2022-01-09 ENCOUNTER — Other Ambulatory Visit (HOSPITAL_COMMUNITY): Payer: Self-pay

## 2022-01-09 ENCOUNTER — Telehealth: Payer: Self-pay

## 2022-01-09 ENCOUNTER — Other Ambulatory Visit: Payer: Self-pay

## 2022-01-09 ENCOUNTER — Inpatient Hospital Stay: Payer: Medicare Other

## 2022-01-09 VITALS — BP 114/83 | HR 112 | Temp 98.0°F | Resp 17 | Ht 62.5 in | Wt 164.5 lb

## 2022-01-09 DIAGNOSIS — E785 Hyperlipidemia, unspecified: Secondary | ICD-10-CM

## 2022-01-09 DIAGNOSIS — I10 Essential (primary) hypertension: Secondary | ICD-10-CM

## 2022-01-09 DIAGNOSIS — J45909 Unspecified asthma, uncomplicated: Secondary | ICD-10-CM | POA: Diagnosis not present

## 2022-01-09 DIAGNOSIS — D751 Secondary polycythemia: Secondary | ICD-10-CM | POA: Diagnosis not present

## 2022-01-09 DIAGNOSIS — F1290 Cannabis use, unspecified, uncomplicated: Secondary | ICD-10-CM

## 2022-01-09 DIAGNOSIS — F419 Anxiety disorder, unspecified: Secondary | ICD-10-CM

## 2022-01-09 DIAGNOSIS — F17210 Nicotine dependence, cigarettes, uncomplicated: Secondary | ICD-10-CM | POA: Diagnosis not present

## 2022-01-09 DIAGNOSIS — E119 Type 2 diabetes mellitus without complications: Secondary | ICD-10-CM

## 2022-01-09 DIAGNOSIS — F32A Depression, unspecified: Secondary | ICD-10-CM | POA: Diagnosis not present

## 2022-01-09 DIAGNOSIS — R911 Solitary pulmonary nodule: Secondary | ICD-10-CM

## 2022-01-09 DIAGNOSIS — Z7984 Long term (current) use of oral hypoglycemic drugs: Secondary | ICD-10-CM | POA: Diagnosis not present

## 2022-01-09 DIAGNOSIS — Z79899 Other long term (current) drug therapy: Secondary | ICD-10-CM

## 2022-01-09 LAB — CMP (CANCER CENTER ONLY)
ALT: 19 U/L (ref 0–44)
AST: 18 U/L (ref 15–41)
Albumin: 4.4 g/dL (ref 3.5–5.0)
Alkaline Phosphatase: 96 U/L (ref 38–126)
Anion gap: 6 (ref 5–15)
BUN: 23 mg/dL — ABNORMAL HIGH (ref 6–20)
CO2: 29 mmol/L (ref 22–32)
Calcium: 10.5 mg/dL — ABNORMAL HIGH (ref 8.9–10.3)
Chloride: 105 mmol/L (ref 98–111)
Creatinine: 1.63 mg/dL — ABNORMAL HIGH (ref 0.44–1.00)
GFR, Estimated: 39 mL/min — ABNORMAL LOW (ref >60)
Glucose, Bld: 95 mg/dL (ref 70–99)
Potassium: 4.1 mmol/L (ref 3.5–5.1)
Sodium: 140 mmol/L (ref 135–145)
Total Bilirubin: 0.4 mg/dL (ref 0.3–1.2)
Total Protein: 7.9 g/dL (ref 6.5–8.1)

## 2022-01-09 LAB — IRON AND IRON BINDING CAPACITY (CC-WL,HP ONLY)
Iron: 65 ug/dL (ref 28–170)
Saturation Ratios: 14 % (ref 10.4–31.8)
TIBC: 462 ug/dL — ABNORMAL HIGH (ref 250–450)
UIBC: 397 ug/dL (ref 148–442)

## 2022-01-09 LAB — FERRITIN: Ferritin: 50 ng/mL (ref 11–307)

## 2022-01-09 LAB — CBC WITH DIFFERENTIAL (CANCER CENTER ONLY)
Abs Immature Granulocytes: 0.02 10*3/uL (ref 0.00–0.07)
Basophils Absolute: 0.1 10*3/uL (ref 0.0–0.1)
Basophils Relative: 1 %
Eosinophils Absolute: 0.1 10*3/uL (ref 0.0–0.5)
Eosinophils Relative: 2 %
HCT: 43.5 % (ref 36.0–46.0)
Hemoglobin: 14.2 g/dL (ref 12.0–15.0)
Immature Granulocytes: 0 %
Lymphocytes Relative: 38 %
Lymphs Abs: 3.1 10*3/uL (ref 0.7–4.0)
MCH: 30.2 pg (ref 26.0–34.0)
MCHC: 32.6 g/dL (ref 30.0–36.0)
MCV: 92.6 fL (ref 80.0–100.0)
Monocytes Absolute: 0.6 10*3/uL (ref 0.1–1.0)
Monocytes Relative: 7 %
Neutro Abs: 4.2 10*3/uL (ref 1.7–7.7)
Neutrophils Relative %: 52 %
Platelet Count: 215 10*3/uL (ref 150–400)
RBC: 4.7 MIL/uL (ref 3.87–5.11)
RDW: 12.2 % (ref 11.5–15.5)
WBC Count: 8.1 10*3/uL (ref 4.0–10.5)
nRBC: 0 % (ref 0.0–0.2)

## 2022-01-09 LAB — RETIC PANEL
Immature Retic Fract: 16.7 % — ABNORMAL HIGH (ref 2.3–15.9)
RBC.: 4.57 MIL/uL (ref 3.87–5.11)
Retic Count, Absolute: 98.3 10*3/uL (ref 19.0–186.0)
Retic Ct Pct: 2.2 % (ref 0.4–3.1)
Reticulocyte Hemoglobin: 33.1 pg (ref >27.9)

## 2022-01-09 MED ORDER — GABAPENTIN 800 MG PO TABS
ORAL_TABLET | ORAL | 0 refills | Status: DC
Start: 2022-01-09 — End: 2022-02-04
  Filled 2022-01-11: qty 90, 30d supply, fill #0

## 2022-01-09 NOTE — Telephone Encounter (Signed)
Telephone encounter was:  Successful.  01/09/2022 Name: Leah Barber MRN: [id number] DOB: 17-Sep-1975  [name] is a 46 y.o. year old female who is a primary care patient of Barbette Merino, NP . The community resource team was consulted for assistance with Transportation Needs   Care guide performed the following interventions: Follow up call placed to the patient to discuss status of referral. Patient advised she has not received transportation information by mail yet. Address has been changed. Therefore CG will resend to new address.  Follow Up Plan:  Care guide will follow up with patient by phone over the next few days to ensure mail is received.   [facility] Guide, Embedded Care Coordination [facility]  [place], [place] [address]  Main Phone: [phone]  E-mail: [email or web address]  Website: [email or web address]

## 2022-01-09 NOTE — Progress Notes (Signed)
[name] [phone]) [phone]   Fax:([phone]) [place] NOTE  Patient Care Team: [name], NP as PCP - General (Adult Health Nurse Practitioner) Monna Fam, MD as Consulting Physician (Ophthalmology)  Hematological/Oncological History # Polycythemia 11/06/2021: WBC 9.1, Hgb 16.0, MCV 90.4, Plt 186  01/09/2022: establish care with Dr. Lorenso Courier   CHIEF COMPLAINTS/PURPOSE OF CONSULTATION:  "Polycythemia "  HISTORY OF PRESENTING ILLNESS:  Leah Barber 46 y.o. female with medical history significant for polysubstance abuse, GERD, DM type II, anxiety, asthma, and pseudotumor cerebri who presents for evaluation of polycythemia.   On review of the previous records Leah Barber was noted to have an elevated hemoglobin on 11/06/2021.  At that time showed a white blood cell count 9.1, hemoglobin 16.0, MCV of 90.4, and platelets of 186.  She only has 1 elevated hemoglobin on record.  She last had a normal on 07/25/2021 at a time she had a hemoglobin of 15.7 with an MCV of 89 and hematocrit of 47.3.  Due to concern for these findings the patient was referred to hematology for further evaluation and management.  On exam today Leah Barber is accompanied by her daughter.  She reports that she is quite anxious.  She notes that she is an active smoker smoking 1 pack/day of cigarettes with some occasional marijuana.  She does not vape.  She is been smoking since age [age].  She has been attempting to quit.  On further discussion she notes that she does not snore or have difficulty with stop breathing at night.  She tends to sleep upright in a chair and does wake up frequently.  On further discussion she notes that her family history is remarkable for a cousin with lupus, and maternal grandmother and mother had kidney disease.  She reports she does not currently drink any alcohol and is currently on disability for mental illness.  She has history of PTSD.  She does have  some occasional bouts of itching and is also been feeling "sick on her stomach".  She notes that she is not having any further issues with fevers, chills, sweats, nausea, ming or diarrhea.  Full 10 point ROS is listed below.  MEDICAL HISTORY:  Past Medical History:  Diagnosis Date   Allergy    Anemia    Anxiety    Arthritis    Asthma    Chronic lower back pain    Depression    Diabetes mellitus without complication (HCC)    Dyspnea    GERD (gastroesophageal reflux disease)    Glaucoma    Hyperlipemia    Hypertension    IIH (idiopathic intracranial hypertension)    Pseudotumor cerebri 08/13/2019   Substance abuse ([place])    Rx drugs for pain medication. Has not had in 5 years.    SURGICAL HISTORY: Past Surgical History:  Procedure Laterality Date   CESAREAN SECTION     x 3   CYSTOSCOPY Bilateral 11/25/2019   Procedure: CYSTOSCOPY;  Surgeon: Lavonia Drafts, MD;  Location: [facility];  Service: Gynecology;  Laterality: Bilateral;   DERMOID CYST  EXCISION     fluid removed from brain  12/20/2018   ROBOTIC ASSISTED TOTAL HYSTERECTOMY Bilateral 11/25/2019   Procedure: XI ROBOTIC ASSISTED TOTAL HYSTERECTOMY WITH SALPINGECTOMY;  Surgeon: Lavonia Drafts, MD;  Location: [facility];  Service: Gynecology;  Laterality: Bilateral;    SOCIAL HISTORY: Social History   Socioeconomic History   Marital status: Legally Separated  Spouse name: Not on file   Number of children: Not on file   Years of education: Not on file   Highest education level: Not on file  Occupational History   Not on file  Tobacco Use   Smoking status: Every Day    Packs/day: 0.25    Types: Cigarettes   Smokeless tobacco: Never  Vaping Use   Vaping Use: Never used  Substance and Sexual Activity   Alcohol use: Yes    Comment: occasional   Drug use: No   Sexual activity: Yes    Birth control/protection: Condom  Other Topics Concern   Not on file  Social  History Narrative   Not on file   Social Determinants of Health   Financial Resource Strain: High Risk   Difficulty of Paying Living Expenses: Hard  Food Insecurity: Food Insecurity Present   Worried About Running Out of Food in the Last Year: Sometimes true   Ran Out of Food in the Last Year: Sometimes true  Transportation Needs: Unmet Transportation Needs   Lack of Transportation (Medical): Yes   Lack of Transportation (Non-Medical): Not on file  Physical Activity: Insufficiently Active   Days of Exercise per Week: 7 days   Minutes of Exercise per Session: 20 min  Stress: Stress Concern Present   Feeling of Stress : To some extent  Social Connections: Socially Isolated   Frequency of Communication with Friends and Family: More than three times a week   Frequency of Social Gatherings with Friends and Family: More than three times a week   Attends Religious Services: Never   Marine scientist or Organizations: No   Attends Music therapist: Never   Marital Status: Divorced  Human resources officer Violence: Not At Risk   Fear of Current or Ex-Partner: No   Emotionally Abused: No   Physically Abused: No   Sexually Abused: No    FAMILY HISTORY: Family History  Problem Relation Age of Onset   Diabetes Mother    Hypertension Mother    Cancer Father    Liver cancer Father    Colon cancer Neg Hx    Esophageal cancer Neg Hx    Rectal cancer Neg Hx    Stomach cancer Neg Hx    Pancreatic cancer Neg Hx     ALLERGIES:  is allergic to clindamycin/lincomycin, other, hydrocodone, and morphine.  MEDICATIONS:  Current Outpatient Medications  Medication Sig Dispense Refill   Albuterol Sulfate (PROAIR RESPICLICK) 782 (90 Base) MCG/ACT AEPB Inhale 2 puffs into the lungs 4 (four) times daily as needed. 1 each 5   amLODipine (NORVASC) 10 MG tablet Take 1 tablet (10 mg total) by mouth daily. 30 tablet 2   Buprenorphine HCl-Naloxone HCl 8-2 MG FILM Place 1 film under the  tongue 3 times a day 90 each 0   cetirizine (ZYRTEC) 10 MG tablet TAKE 1 TABLET BY MOUTH EVERY DAY 90 tablet 3   cloNIDine (CATAPRES) 0.1 MG tablet Take 1 tablet (0.1 mg total) by mouth 3 (three) times daily. 90 tablet 5   CONCERTA 18 MG CR tablet Take 18 mg by mouth daily as needed.     dapagliflozin propanediol (FARXIGA) 5 MG TABS tablet Take 1 tablet by mouth daily 30 tablet 2   diclofenac (VOLTAREN) 50 MG EC tablet Take 1 tablet by mouth 3 times daily as needed for pain. 90 tablet 0   DULoxetine (CYMBALTA) 30 MG capsule Take 1 capsule by mouth 2 times a day 60  capsule 2   fluticasone (FLONASE) 50 MCG/ACT nasal spray Place 2 sprays into both nostrils daily. 48 g 1   furosemide (LASIX) 20 MG tablet Take 1 tablet (20 mg total) by mouth daily. 30 tablet 5   gabapentin (NEURONTIN) 800 MG tablet TAKE 1 TABLET BY MOUTH EVERY 8 HOURS 90 tablet 2   gabapentin (NEURONTIN) 800 MG tablet Take 1 tablet by mouth three times daily 90 tablet 0   glipiZIDE (GLUCOTROL) 5 MG tablet Take 1 tablet by mouth daily 30 tablet 2   hydrOXYzine (VISTARIL) 50 MG capsule Take 1 capsule (50 mg total) by mouth 4 (four) times daily as needed for anxiety or nausea. 30 capsule 0   latanoprost (XALATAN) 0.005 % ophthalmic solution Place 1 drop in eye(s) daily as directed. 2.5 mL 0   Multiple Vitamins-Minerals (WOMENS MULTIVITAMIN PO) Take 1 tablet by mouth daily.      omeprazole (PRILOSEC) 40 MG capsule Take 1 capsule (40 mg total) by mouth daily. 90 capsule 1   rosuvastatin (CRESTOR) 5 MG tablet Take 1 tablet (5 mg total) by mouth daily. 30 tablet 5   No current facility-administered medications for this visit.    REVIEW OF SYSTEMS:   Constitutional: ( - ) fevers, ( - )  chills , ( - ) night sweats Eyes: ( - ) blurriness of vision, ( - ) double vision, ( - ) watery eyes Ears, nose, mouth, throat, and face: ( - ) mucositis, ( - ) sore throat Respiratory: ( - ) cough, ( - ) dyspnea, ( - ) wheezes Cardiovascular: ( - )  palpitation, ( - ) chest discomfort, ( - ) lower extremity swelling Gastrointestinal:  ( - ) nausea, ( - ) heartburn, ( - ) change in bowel habits Skin: ( - ) abnormal skin rashes Lymphatics: ( - ) new lymphadenopathy, ( - ) easy bruising Neurological: ( - ) numbness, ( - ) tingling, ( - ) new weaknesses Behavioral/Psych: ( - ) mood change, ( - ) new changes  All other systems were reviewed with the patient and are negative.  PHYSICAL EXAMINATION:  Vitals:   01/09/22 0941  BP: 114/83  Pulse: (!) 112  Resp: 17  Temp: 98 F (36.7 C)  SpO2: 98%   Filed Weights   01/09/22 0941  Weight: 164 lb 8 oz (74.6 kg)    GENERAL: well appearing anxious middle-aged African-American female in NAD  SKIN: skin color, texture, turgor are normal, no rashes or significant lesions EYES: conjunctiva are pink and non-injected, sclera clear LUNGS: clear to auscultation and percussion with normal breathing effort HEART: regular rate & rhythm and no murmurs and no lower extremity edema Musculoskeletal: no cyanosis of digits and no clubbing  PSYCH: alert & oriented x 3, fluent speech NEURO: no focal motor/sensory deficits  LABORATORY DATA:  I have reviewed the data as listed    Latest Ref Rng & Units 01/09/2022   10:52 AM 11/06/2021    1:15 PM 07/25/2021    4:25 PM  CBC  WBC 4.0 - 10.5 K/uL 8.1   9.1   7.3    Hemoglobin 12.0 - 15.0 g/dL 14.2   16.0   15.7    Hematocrit 36.0 - 46.0 % 43.5   47.1   47.3    Platelets 150 - 400 K/uL 215   186   234         Latest Ref Rng & Units 01/09/2022   10:52 AM 11/06/2021    1:15  PM 07/25/2021    4:25 PM  CMP  Glucose 70 - 99 mg/dL 95   163   92    BUN 6 - 20 mg/dL '23   18   8    '$ Creatinine 0.44 - 1.00 mg/dL 1.63   0.92   0.77    Sodium 135 - 145 mmol/L 140   140   140    Potassium 3.5 - 5.1 mmol/L 4.1   3.2   3.4    Chloride 98 - 111 mmol/L 105   103   101    CO2 22 - 32 mmol/L 29   24     Calcium 8.9 - 10.3 mg/dL 10.5   9.2   10.1    Total Protein 6.5 -  8.1 g/dL 7.9   8.2   7.4    Total Bilirubin 0.3 - 1.2 mg/dL 0.4   0.5   0.3    Alkaline Phos 38 - 126 U/L 96   82   124    AST 15 - 41 U/L '18   21   18    '$ ALT 0 - 44 U/L 19   19        ASSESSMENT & PLAN Leah Barber 46 y.o. female with medical history significant for polysubstance abuse, GERD, DM type II, anxiety, asthma, and pseudotumor cerebri who presents for evaluation of polycythemia.  After review of the labs, review of the records, and discussion with the patient the patients findings are most consistent with a secondary polycythemia.  There are two types of polycythemia, Primary polycythemia and secondary polycythemia. Primary polycythemia is overproduction of red blood cells due to a driver mutation. The most common mutation is the JAK2 V617F (95% of cases), but there are other mutations which can cause this disorder. Primary polycythemia is a myeloproliferative neoplasm which may require cytoreductive therapy to decrease risk of thrombosis. This can consist of medications or phlebotomy to drive down the red blood cell counts. Secondary polycythemia is polycythemia driven by low oxygen levels. This represents an appropriate response of the body attempting to increase red cell volume. Causes of secondary polycythemia include smoking (most common), obstructive sleep apnea (OSA), or living at altitude. This can also be caused by testosterone supplementation. Certain thalassemias can present with marked erythrocytosis , but normal hemoglobin. Secondary polycythemia does not have the same level of thrombotic risk and therefore does not require cytoreductive therapy or phlebotomy.    #Polycythemia, likely secondary to smoking --workup to include CBC, CMP, reticulocyte count, and erythropoietin level  --patient is an active smoker. Encourage smoking cessation.     --no signs of symptoms concerning for OSA  --will order MPN workup to include JAK2 with reflex and BCR/ABL FISH --RTC pending the  results of the above studies.   #Lung Nodule --noted on CTA in 2021, 1 year f/u with non-contrasted CT scan recommended --order placed today for this request.   Orders Placed This Encounter  Procedures   CT Chest Wo Contrast    CT angio in 2021 recommend follow up scan for 5 mm nodule    Standing Status:   Future    Standing Expiration Date:   01/09/2023    Order Specific Question:   Is patient pregnant?    Answer:   No    Order Specific Question:   Preferred imaging location?    Answer:   [facility]   CBC with Differential ([facility] Center Only)    Standing Status:  Future    Number of Occurrences:   1    Standing Expiration Date:   01/10/2023   CMP ([facility] only)    Standing Status:   Future    Number of Occurrences:   1    Standing Expiration Date:   01/10/2023   Ferritin    Standing Status:   Future    Number of Occurrences:   1    Standing Expiration Date:   01/10/2023   JAK2 (INCLUDING V617F AND EXON 12), MPL,& CALR W/RFL MPN PANEL (NGS)    Standing Status:   Future    Number of Occurrences:   1    Standing Expiration Date:   01/09/2023   BCR ABL1 FISH ([facility])    Standing Status:   Future    Number of Occurrences:   1    Standing Expiration Date:   01/10/2023   Erythropoietin    Standing Status:   Future    Number of Occurrences:   1    Standing Expiration Date:   01/09/2023   Iron and Iron Binding Capacity ([facility] only)    Standing Status:   Future    Number of Occurrences:   1    Standing Expiration Date:   01/10/2023   Retic Panel    Standing Status:   Future    Number of Occurrences:   1    Standing Expiration Date:   01/10/2023    All questions were answered. The patient knows to call the clinic with any problems, questions or concerns.  A total of more than 60 minutes were spent on this encounter with face-to-face time and non-face-to-face time, including preparing to see the patient, ordering tests and/or medications, counseling the patient  and coordination of care as outlined above.   Ledell Peoples, MD Department of Hematology/Oncology [place] [facility] Phone: [phone] Pager: [phone] Email: [email or web address]  01/09/2022 12:54 PM

## 2022-01-10 DIAGNOSIS — Z79899 Other long term (current) drug therapy: Secondary | ICD-10-CM | POA: Diagnosis not present

## 2022-01-10 LAB — ERYTHROPOIETIN: Erythropoietin: 5.6 m[IU]/mL (ref 2.6–18.5)

## 2022-01-11 ENCOUNTER — Other Ambulatory Visit (HOSPITAL_COMMUNITY): Payer: Self-pay

## 2022-01-11 MED ORDER — LATANOPROST 0.005 % OP SOLN
OPHTHALMIC | 0 refills | Status: DC
Start: 2022-01-11 — End: 2024-03-28
  Filled 2022-01-11: qty 2.5, 30d supply, fill #0
  Filled 2022-05-05: qty 2.5, 25d supply, fill #0

## 2022-01-12 ENCOUNTER — Other Ambulatory Visit (HOSPITAL_COMMUNITY): Payer: Self-pay

## 2022-01-17 ENCOUNTER — Telehealth: Payer: Self-pay | Admitting: *Deleted

## 2022-01-17 ENCOUNTER — Telehealth: Payer: Self-pay

## 2022-01-17 ENCOUNTER — Ambulatory Visit (HOSPITAL_COMMUNITY): Admission: RE | Admit: 2022-01-17 | Payer: Medicare Other | Source: Ambulatory Visit

## 2022-01-17 LAB — JAK2 (INCLUDING V617F AND EXON 12), MPL,& CALR W/RFL MPN PANEL (NGS)

## 2022-01-17 LAB — BCR ABL1 FISH (GENPATH)

## 2022-01-17 NOTE — Telephone Encounter (Signed)
Telephone encounter was:  Successful.  01/17/2022 Name: Leah Barber MRN: [id number] DOB: 18-Oct-1975  [name] is a 46 y.o. year old female who is a primary care patient of Barbette Merino, NP . The community resource team was consulted for assistance with Transportation Needs   Care guide performed the following interventions: Follow up call placed to the patient to discuss status of referral. CG called to inquire if pt received mailed resources. Pt advised she lost her mailbox but has now found it and will be checking mail today.  Follow Up Plan:   Pt advised she will call CG to advise if mail arrived or not  [facility] Guide, Embedded Care Coordination [facility]  [place], [place] [address]  Main Phone: [phone]  E-mail: [email or web address]  Website: [email or web address]

## 2022-01-17 NOTE — Telephone Encounter (Signed)
-----   Message from Orson Slick, MD sent at 01/17/2022 12:49 PM EDT ----- Please let Ms. Gagan know that her hemoglobin levels have returned to normal.  The rest of our testing was otherwise negative with no concerning findings.  There is no need for routine follow-up in our clinic. ----- Message ----- From: [place]: 01/09/2022  11:15 AM EDT To: Orson Slick, MD

## 2022-01-17 NOTE — Telephone Encounter (Signed)
Attempted to call to give message below. Mailbox full.

## 2022-01-17 NOTE — Telephone Encounter (Signed)
Notified of message below

## 2022-01-19 ENCOUNTER — Telehealth: Payer: Self-pay

## 2022-01-19 NOTE — Telephone Encounter (Signed)
Telephone encounter was:  Unsuccessful.  01/19/2022 Name: ZURIYA ZARZA MRN: [id number] DOB: 1975/11/11  Unsuccessful outbound call made today to assist with:  Transportation Needs   Outreach Attempt:  3rd Attempt.  Referral closed unable to contact patient.  A HIPAA compliant voice message was left requesting a return call.  Instructed patient to call back at [phone] at their earliest convenience.  CG sent needed resources to pt on 01/09/22 regarding Transportation in area. CG has unsuccessfully been able to confirm from pt that resources have been received. CG has left voicemail's for pt leaving number behind.   CG did successfully confirm with [employer] that pt has 48 one ways rides with safe rides on 12/26/21. CG has also educated patient on how to book rides with [facility]. Pt verbally advised she understood.  At this time, pt can utilize [employer] until information on [employer] in the [place] has been received.    Due to unsuccessful attempts, referral must be closed. Pt will need to contact CG or referring clinic if resources or support is still needed.   [facility] Guide, Embedded Care Coordination [facility]  [place], [place] [address]  Main Phone: [phone]  E-mail: [email or web address]  Website: [email or web address]

## 2022-01-19 NOTE — Telephone Encounter (Signed)
Telephone encounter was:  Unsuccessful.  01/19/2022 Name: Leah Barber MRN: [id number] DOB: 05-18-1976  Unsuccessful outbound call made today to assist with:  Transportation Needs   Outreach Attempt:  2nd Attempt Follow up on mail  A HIPAA compliant voice message was left requesting a return call.  Instructed patient to call back at [phone] at their earliest convenience.  [facility] Guide, Embedded Care Coordination [facility]  [place], [place] [address]  Main Phone: [phone]  E-mail: [email or web address]  Website: [email or web address]

## 2022-01-20 ENCOUNTER — Other Ambulatory Visit (HOSPITAL_COMMUNITY): Payer: Self-pay

## 2022-01-23 ENCOUNTER — Ambulatory Visit: Payer: Medicaid Other | Admitting: Nurse Practitioner

## 2022-01-25 ENCOUNTER — Other Ambulatory Visit (HOSPITAL_COMMUNITY): Payer: Self-pay

## 2022-01-26 ENCOUNTER — Other Ambulatory Visit (HOSPITAL_COMMUNITY): Payer: Self-pay

## 2022-01-26 MED ORDER — DICLOFENAC SODIUM 50 MG PO TBEC
DELAYED_RELEASE_TABLET | ORAL | 0 refills | Status: DC
  Filled 2022-01-26: qty 90, 30d supply, fill #0

## 2022-01-31 ENCOUNTER — Other Ambulatory Visit: Payer: Self-pay | Admitting: Nurse Practitioner

## 2022-01-31 ENCOUNTER — Other Ambulatory Visit (HOSPITAL_COMMUNITY): Payer: Self-pay

## 2022-01-31 DIAGNOSIS — R1030 Lower abdominal pain, unspecified: Secondary | ICD-10-CM

## 2022-02-01 ENCOUNTER — Other Ambulatory Visit (HOSPITAL_COMMUNITY): Payer: Self-pay

## 2022-02-04 ENCOUNTER — Other Ambulatory Visit (HOSPITAL_COMMUNITY): Payer: Self-pay

## 2022-02-04 DIAGNOSIS — E1169 Type 2 diabetes mellitus with other specified complication: Secondary | ICD-10-CM | POA: Diagnosis not present

## 2022-02-04 DIAGNOSIS — Z0130 Encounter for examination of blood pressure without abnormal findings: Secondary | ICD-10-CM | POA: Diagnosis not present

## 2022-02-04 DIAGNOSIS — Z79899 Other long term (current) drug therapy: Secondary | ICD-10-CM | POA: Diagnosis not present

## 2022-02-04 MED ORDER — FARXIGA 10 MG PO TABS
10.0000 mg | ORAL_TABLET | Freq: Every day | ORAL | 2 refills | Status: DC
  Filled 2022-02-04: qty 30, 30d supply, fill #0
  Filled 2022-07-17 – 2022-11-30 (×2): qty 30, 30d supply, fill #1

## 2022-02-04 MED ORDER — LATANOPROST 0.005 % OP SOLN
OPHTHALMIC | 2 refills | Status: DC
  Filled 2022-02-04: qty 2.5, 25d supply, fill #0
  Filled 2022-02-24: qty 2.5, 25d supply, fill #1
  Filled 2022-06-05: qty 2.5, 25d supply, fill #2

## 2022-02-04 MED ORDER — GLIPIZIDE 5 MG PO TABS
5.0000 mg | ORAL_TABLET | Freq: Every day | ORAL | 2 refills | Status: DC
  Filled 2022-02-04 – 2022-06-15 (×2): qty 30, 30d supply, fill #0

## 2022-02-04 MED ORDER — GABAPENTIN 800 MG PO TABS
800.0000 mg | ORAL_TABLET | Freq: Three times a day (TID) | ORAL | 0 refills | Status: DC
  Filled 2022-02-04 – 2022-02-22 (×2): qty 90, 30d supply, fill #0

## 2022-02-04 MED ORDER — BUPRENORPHINE HCL-NALOXONE HCL 8-2 MG SL FILM
ORAL_FILM | SUBLINGUAL | 0 refills | Status: DC
  Filled 2022-02-04: qty 90, 30d supply, fill #0

## 2022-02-04 MED ORDER — OMEPRAZOLE 20 MG PO CPDR
20.0000 mg | DELAYED_RELEASE_CAPSULE | Freq: Every day | ORAL | 2 refills | Status: DC
  Filled 2022-02-04: qty 30, 30d supply, fill #0

## 2022-02-07 ENCOUNTER — Telehealth: Payer: Self-pay | Admitting: Neurology

## 2022-02-07 ENCOUNTER — Ambulatory Visit: Payer: Medicare Other | Admitting: Neurology

## 2022-02-07 DIAGNOSIS — F17290 Nicotine dependence, other tobacco product, uncomplicated: Secondary | ICD-10-CM | POA: Diagnosis not present

## 2022-02-07 DIAGNOSIS — I10 Essential (primary) hypertension: Secondary | ICD-10-CM | POA: Diagnosis not present

## 2022-02-07 DIAGNOSIS — Z20822 Contact with and (suspected) exposure to covid-19: Secondary | ICD-10-CM | POA: Diagnosis not present

## 2022-02-07 DIAGNOSIS — E876 Hypokalemia: Secondary | ICD-10-CM | POA: Diagnosis not present

## 2022-02-07 NOTE — Telephone Encounter (Signed)
Pt was a no show to the apt that is new for Dr Brett Fairy.

## 2022-02-09 DIAGNOSIS — G894 Chronic pain syndrome: Secondary | ICD-10-CM | POA: Diagnosis not present

## 2022-02-09 DIAGNOSIS — F17200 Nicotine dependence, unspecified, uncomplicated: Secondary | ICD-10-CM | POA: Diagnosis not present

## 2022-02-09 DIAGNOSIS — I10 Essential (primary) hypertension: Secondary | ICD-10-CM | POA: Diagnosis not present

## 2022-02-09 DIAGNOSIS — E119 Type 2 diabetes mellitus without complications: Secondary | ICD-10-CM | POA: Diagnosis not present

## 2022-02-13 DIAGNOSIS — Z79899 Other long term (current) drug therapy: Secondary | ICD-10-CM | POA: Diagnosis not present

## 2022-02-17 ENCOUNTER — Other Ambulatory Visit (HOSPITAL_COMMUNITY): Payer: Self-pay

## 2022-02-17 MED ORDER — MIRTAZAPINE 15 MG PO TABS
ORAL_TABLET | ORAL | 0 refills | Status: DC
  Filled 2022-02-17 (×2): qty 30, 30d supply, fill #0

## 2022-02-17 MED ORDER — BUPRENORPHINE HCL-NALOXONE HCL 8-2 MG SL SUBL
SUBLINGUAL_TABLET | SUBLINGUAL | 0 refills | Status: DC
  Filled 2022-02-17 – 2022-02-18 (×2): qty 21, 7d supply, fill #0

## 2022-02-18 ENCOUNTER — Other Ambulatory Visit (HOSPITAL_COMMUNITY): Payer: Self-pay

## 2022-02-22 ENCOUNTER — Other Ambulatory Visit (HOSPITAL_COMMUNITY): Payer: Self-pay

## 2022-02-23 ENCOUNTER — Other Ambulatory Visit (HOSPITAL_COMMUNITY): Payer: Self-pay

## 2022-02-23 DIAGNOSIS — Z760 Encounter for issue of repeat prescription: Secondary | ICD-10-CM | POA: Diagnosis not present

## 2022-02-23 DIAGNOSIS — Z0130 Encounter for examination of blood pressure without abnormal findings: Secondary | ICD-10-CM | POA: Diagnosis not present

## 2022-02-24 ENCOUNTER — Other Ambulatory Visit (HOSPITAL_COMMUNITY): Payer: Self-pay

## 2022-02-27 ENCOUNTER — Other Ambulatory Visit (HOSPITAL_COMMUNITY): Payer: Self-pay

## 2022-02-27 MED ORDER — DULOXETINE HCL 30 MG PO CPEP
30.0000 mg | ORAL_CAPSULE | Freq: Two times a day (BID) | ORAL | 2 refills | Status: DC
  Filled 2022-02-27: qty 60, 30d supply, fill #0
  Filled 2022-03-24 – 2022-05-12 (×2): qty 60, 30d supply, fill #1
  Filled 2022-08-28: qty 60, 30d supply, fill #2

## 2022-02-27 MED ORDER — OMEPRAZOLE 20 MG PO CPDR
20.0000 mg | DELAYED_RELEASE_CAPSULE | Freq: Every day | ORAL | 2 refills | Status: DC
  Filled 2022-02-27: qty 30, 30d supply, fill #0
  Filled 2022-03-24 – 2022-11-30 (×2): qty 30, 30d supply, fill #1

## 2022-02-27 MED ORDER — OMEPRAZOLE 20 MG PO CPDR: 20.0000 mg | DELAYED_RELEASE_CAPSULE | Freq: Every day | ORAL | 2 refills | Status: DC

## 2022-02-27 MED ORDER — DULOXETINE HCL 30 MG PO CPEP
30.0000 mg | ORAL_CAPSULE | Freq: Two times a day (BID) | ORAL | 2 refills | Status: DC
  Filled 2022-09-11 – 2022-09-12 (×2): qty 60, 30d supply, fill #0
  Filled 2022-09-28: qty 60, 30d supply, fill #1
  Filled 2022-11-30: qty 60, 30d supply, fill #2

## 2022-03-01 ENCOUNTER — Other Ambulatory Visit (HOSPITAL_COMMUNITY): Payer: Self-pay

## 2022-03-03 ENCOUNTER — Other Ambulatory Visit (HOSPITAL_COMMUNITY): Payer: Self-pay

## 2022-03-05 DIAGNOSIS — E1169 Type 2 diabetes mellitus with other specified complication: Secondary | ICD-10-CM | POA: Diagnosis not present

## 2022-03-05 DIAGNOSIS — Z79899 Other long term (current) drug therapy: Secondary | ICD-10-CM | POA: Diagnosis not present

## 2022-03-05 DIAGNOSIS — K219 Gastro-esophageal reflux disease without esophagitis: Secondary | ICD-10-CM | POA: Diagnosis not present

## 2022-03-05 DIAGNOSIS — F32A Depression, unspecified: Secondary | ICD-10-CM | POA: Diagnosis not present

## 2022-03-05 DIAGNOSIS — H409 Unspecified glaucoma: Secondary | ICD-10-CM | POA: Diagnosis not present

## 2022-03-05 DIAGNOSIS — I10 Essential (primary) hypertension: Secondary | ICD-10-CM | POA: Diagnosis not present

## 2022-03-05 DIAGNOSIS — J302 Other seasonal allergic rhinitis: Secondary | ICD-10-CM | POA: Diagnosis not present

## 2022-03-05 DIAGNOSIS — Z0130 Encounter for examination of blood pressure without abnormal findings: Secondary | ICD-10-CM | POA: Diagnosis not present

## 2022-03-05 DIAGNOSIS — G629 Polyneuropathy, unspecified: Secondary | ICD-10-CM | POA: Diagnosis not present

## 2022-03-06 ENCOUNTER — Other Ambulatory Visit (HOSPITAL_COMMUNITY): Payer: Self-pay

## 2022-03-06 MED ORDER — GABAPENTIN 800 MG PO TABS
ORAL_TABLET | ORAL | 0 refills | Status: DC
  Filled 2022-03-06 – 2022-04-26 (×3): qty 90, 30d supply, fill #0

## 2022-03-06 MED ORDER — DICLOFENAC SODIUM ER 100 MG PO TB24
100.0000 mg | ORAL_TABLET | Freq: Every day | ORAL | 0 refills | Status: DC
Start: 2022-03-05 — End: 2022-04-13
  Filled 2022-03-06: qty 14, 14d supply, fill #0

## 2022-03-06 MED ORDER — OMEPRAZOLE 20 MG PO CPDR
DELAYED_RELEASE_CAPSULE | ORAL | 2 refills | Status: DC
  Filled 2022-03-06 – 2022-04-12 (×3): qty 30, 30d supply, fill #0

## 2022-03-06 MED ORDER — FLUTICASONE PROPIONATE 50 MCG/ACT NA SUSP
NASAL | 0 refills | Status: DC
  Filled 2022-03-06: qty 16, 60d supply, fill #0

## 2022-03-06 MED ORDER — LATANOPROST 0.005 % OP SOLN
OPHTHALMIC | 2 refills | Status: DC
  Filled 2022-03-06 – 2022-07-17 (×2): qty 2.5, 25d supply, fill #0

## 2022-03-06 MED ORDER — FARXIGA 10 MG PO TABS
10.0000 mg | ORAL_TABLET | Freq: Every day | ORAL | 2 refills | Status: DC
  Filled 2022-03-06: qty 30, 30d supply, fill #0
  Filled 2022-05-12: qty 30, 30d supply, fill #1
  Filled 2022-10-31: qty 30, 30d supply, fill #2

## 2022-03-06 MED ORDER — BUPRENORPHINE HCL-NALOXONE HCL 8-2 MG SL FILM
ORAL_FILM | SUBLINGUAL | 0 refills | Status: DC
  Filled 2022-03-06: qty 21, 7d supply, fill #0

## 2022-03-07 ENCOUNTER — Other Ambulatory Visit (HOSPITAL_COMMUNITY): Payer: Self-pay

## 2022-03-08 ENCOUNTER — Other Ambulatory Visit: Payer: Self-pay | Admitting: Nurse Practitioner

## 2022-03-08 ENCOUNTER — Other Ambulatory Visit (HOSPITAL_COMMUNITY): Payer: Self-pay

## 2022-03-08 DIAGNOSIS — R110 Nausea: Secondary | ICD-10-CM

## 2022-03-10 ENCOUNTER — Other Ambulatory Visit (HOSPITAL_COMMUNITY): Payer: Self-pay

## 2022-03-14 ENCOUNTER — Other Ambulatory Visit (HOSPITAL_COMMUNITY): Payer: Self-pay

## 2022-03-24 ENCOUNTER — Other Ambulatory Visit (HOSPITAL_COMMUNITY): Payer: Self-pay

## 2022-03-24 ENCOUNTER — Other Ambulatory Visit: Payer: Self-pay | Admitting: Nurse Practitioner

## 2022-03-24 DIAGNOSIS — R110 Nausea: Secondary | ICD-10-CM

## 2022-03-24 MED ORDER — ONDANSETRON HCL 4 MG PO TABS
4.0000 mg | ORAL_TABLET | Freq: Four times a day (QID) | ORAL | 2 refills | Status: AC | PRN
  Filled 2022-03-24: qty 60, 15d supply, fill #0
  Filled 2022-04-03: qty 60, 15d supply, fill #1

## 2022-03-26 DIAGNOSIS — Z79899 Other long term (current) drug therapy: Secondary | ICD-10-CM | POA: Diagnosis not present

## 2022-03-26 DIAGNOSIS — M47816 Spondylosis without myelopathy or radiculopathy, lumbar region: Secondary | ICD-10-CM | POA: Diagnosis not present

## 2022-03-26 DIAGNOSIS — G8929 Other chronic pain: Secondary | ICD-10-CM | POA: Diagnosis not present

## 2022-03-26 DIAGNOSIS — M47812 Spondylosis without myelopathy or radiculopathy, cervical region: Secondary | ICD-10-CM | POA: Diagnosis not present

## 2022-03-26 DIAGNOSIS — I10 Essential (primary) hypertension: Secondary | ICD-10-CM | POA: Diagnosis not present

## 2022-03-26 DIAGNOSIS — M549 Dorsalgia, unspecified: Secondary | ICD-10-CM | POA: Diagnosis not present

## 2022-03-26 DIAGNOSIS — Z0130 Encounter for examination of blood pressure without abnormal findings: Secondary | ICD-10-CM | POA: Diagnosis not present

## 2022-03-26 DIAGNOSIS — J302 Other seasonal allergic rhinitis: Secondary | ICD-10-CM | POA: Diagnosis not present

## 2022-03-26 DIAGNOSIS — G629 Polyneuropathy, unspecified: Secondary | ICD-10-CM | POA: Diagnosis not present

## 2022-03-26 DIAGNOSIS — M539 Dorsopathy, unspecified: Secondary | ICD-10-CM | POA: Diagnosis not present

## 2022-03-26 DIAGNOSIS — Z5181 Encounter for therapeutic drug level monitoring: Secondary | ICD-10-CM | POA: Diagnosis not present

## 2022-03-27 ENCOUNTER — Other Ambulatory Visit (HOSPITAL_COMMUNITY): Payer: Self-pay

## 2022-03-27 MED ORDER — BUPRENORPHINE HCL-NALOXONE HCL 8-2 MG SL FILM
ORAL_FILM | SUBLINGUAL | 0 refills | Status: DC
  Filled 2022-03-27: qty 90, 30d supply, fill #0

## 2022-03-29 DIAGNOSIS — Z79899 Other long term (current) drug therapy: Secondary | ICD-10-CM | POA: Diagnosis not present

## 2022-04-03 ENCOUNTER — Other Ambulatory Visit (HOSPITAL_COMMUNITY): Payer: Self-pay

## 2022-04-04 ENCOUNTER — Other Ambulatory Visit (HOSPITAL_COMMUNITY): Payer: Self-pay

## 2022-04-05 ENCOUNTER — Other Ambulatory Visit (HOSPITAL_COMMUNITY): Payer: Self-pay

## 2022-04-05 MED ORDER — FLUTICASONE PROPIONATE 50 MCG/ACT NA SUSP
NASAL | 0 refills | Status: DC
  Filled 2022-04-05: qty 16, 30d supply, fill #0

## 2022-04-07 ENCOUNTER — Other Ambulatory Visit (HOSPITAL_COMMUNITY): Payer: Self-pay

## 2022-04-10 ENCOUNTER — Other Ambulatory Visit (HOSPITAL_COMMUNITY): Payer: Self-pay

## 2022-04-10 MED ORDER — IBUPROFEN 800 MG PO TABS
800.0000 mg | ORAL_TABLET | Freq: Three times a day (TID) | ORAL | 0 refills | Status: DC | PRN
  Filled 2022-04-10: qty 90, 30d supply, fill #0

## 2022-04-10 MED ORDER — FLUTICASONE PROPIONATE 50 MCG/ACT NA SUSP
NASAL | 0 refills | Status: DC
  Filled 2022-04-10: qty 16, 60d supply, fill #0
  Filled 2022-07-17: qty 16, 30d supply, fill #0

## 2022-04-12 ENCOUNTER — Other Ambulatory Visit: Payer: Self-pay | Admitting: Nurse Practitioner

## 2022-04-12 ENCOUNTER — Other Ambulatory Visit (HOSPITAL_COMMUNITY): Payer: Self-pay

## 2022-04-12 MED ORDER — FLUTICASONE PROPIONATE 50 MCG/ACT NA SUSP
NASAL | 0 refills | Status: DC
  Filled 2022-04-12 – 2022-05-05 (×2): qty 16, 30d supply, fill #0

## 2022-04-12 MED ORDER — AMLODIPINE BESYLATE 10 MG PO TABS
ORAL_TABLET | ORAL | 3 refills | Status: DC
  Filled 2022-04-12: qty 90, 90d supply, fill #0
  Filled 2022-08-15: qty 90, 90d supply, fill #1
  Filled 2022-08-15: qty 30, 30d supply, fill #1

## 2022-04-13 ENCOUNTER — Other Ambulatory Visit (HOSPITAL_COMMUNITY): Payer: Self-pay

## 2022-04-13 MED ORDER — DICLOFENAC SODIUM ER 100 MG PO TB24
100.0000 mg | ORAL_TABLET | Freq: Every day | ORAL | 0 refills | Status: DC
  Filled 2022-04-13: qty 14, 14d supply, fill #0

## 2022-04-14 ENCOUNTER — Other Ambulatory Visit (HOSPITAL_COMMUNITY): Payer: Self-pay

## 2022-04-26 ENCOUNTER — Other Ambulatory Visit (HOSPITAL_COMMUNITY): Payer: Self-pay

## 2022-04-26 DIAGNOSIS — J302 Other seasonal allergic rhinitis: Secondary | ICD-10-CM | POA: Diagnosis not present

## 2022-04-26 DIAGNOSIS — M47816 Spondylosis without myelopathy or radiculopathy, lumbar region: Secondary | ICD-10-CM | POA: Diagnosis not present

## 2022-04-26 DIAGNOSIS — M549 Dorsalgia, unspecified: Secondary | ICD-10-CM | POA: Diagnosis not present

## 2022-04-26 DIAGNOSIS — Z79899 Other long term (current) drug therapy: Secondary | ICD-10-CM | POA: Diagnosis not present

## 2022-04-26 DIAGNOSIS — Z0130 Encounter for examination of blood pressure without abnormal findings: Secondary | ICD-10-CM | POA: Diagnosis not present

## 2022-04-26 DIAGNOSIS — I10 Essential (primary) hypertension: Secondary | ICD-10-CM | POA: Diagnosis not present

## 2022-04-26 DIAGNOSIS — M539 Dorsopathy, unspecified: Secondary | ICD-10-CM | POA: Diagnosis not present

## 2022-04-26 DIAGNOSIS — M47812 Spondylosis without myelopathy or radiculopathy, cervical region: Secondary | ICD-10-CM | POA: Diagnosis not present

## 2022-04-26 DIAGNOSIS — G629 Polyneuropathy, unspecified: Secondary | ICD-10-CM | POA: Diagnosis not present

## 2022-04-26 DIAGNOSIS — G8929 Other chronic pain: Secondary | ICD-10-CM | POA: Diagnosis not present

## 2022-04-27 ENCOUNTER — Other Ambulatory Visit (HOSPITAL_COMMUNITY): Payer: Self-pay

## 2022-04-27 MED ORDER — BUPRENORPHINE HCL-NALOXONE HCL 8-2 MG SL FILM
ORAL_FILM | SUBLINGUAL | 0 refills | Status: DC
  Filled 2022-04-27: qty 60, 20d supply, fill #0
  Filled 2022-04-27: qty 30, 10d supply, fill #0

## 2022-04-28 ENCOUNTER — Other Ambulatory Visit (HOSPITAL_COMMUNITY): Payer: Self-pay

## 2022-05-01 ENCOUNTER — Other Ambulatory Visit (HOSPITAL_COMMUNITY): Payer: Self-pay

## 2022-05-02 ENCOUNTER — Other Ambulatory Visit (HOSPITAL_COMMUNITY): Payer: Self-pay

## 2022-05-02 DIAGNOSIS — Z79899 Other long term (current) drug therapy: Secondary | ICD-10-CM | POA: Diagnosis not present

## 2022-05-02 MED ORDER — OMEPRAZOLE 20 MG PO CPDR
20.0000 mg | DELAYED_RELEASE_CAPSULE | Freq: Every day | ORAL | 2 refills | Status: DC
  Filled 2022-05-02: qty 30, 30d supply, fill #0
  Filled 2022-09-11: qty 30, 30d supply, fill #1
  Filled 2023-02-12: qty 30, 30d supply, fill #2

## 2022-05-03 ENCOUNTER — Other Ambulatory Visit (HOSPITAL_COMMUNITY): Payer: Self-pay

## 2022-05-05 ENCOUNTER — Other Ambulatory Visit (HOSPITAL_COMMUNITY): Payer: Self-pay

## 2022-05-08 ENCOUNTER — Other Ambulatory Visit (HOSPITAL_COMMUNITY): Payer: Self-pay

## 2022-05-08 MED ORDER — ROSUVASTATIN CALCIUM 5 MG PO TABS
5.0000 mg | ORAL_TABLET | Freq: Every day | ORAL | 0 refills | Status: DC
  Filled 2022-05-08: qty 30, 30d supply, fill #0

## 2022-05-12 ENCOUNTER — Other Ambulatory Visit (HOSPITAL_COMMUNITY): Payer: Self-pay

## 2022-05-15 ENCOUNTER — Other Ambulatory Visit (HOSPITAL_COMMUNITY): Payer: Self-pay

## 2022-05-18 ENCOUNTER — Other Ambulatory Visit (HOSPITAL_COMMUNITY): Payer: Self-pay

## 2022-05-26 ENCOUNTER — Other Ambulatory Visit (HOSPITAL_COMMUNITY): Payer: Self-pay

## 2022-05-28 DIAGNOSIS — R5383 Other fatigue: Secondary | ICD-10-CM | POA: Diagnosis not present

## 2022-05-28 DIAGNOSIS — D539 Nutritional anemia, unspecified: Secondary | ICD-10-CM | POA: Diagnosis not present

## 2022-05-28 DIAGNOSIS — I10 Essential (primary) hypertension: Secondary | ICD-10-CM | POA: Diagnosis not present

## 2022-05-28 DIAGNOSIS — M129 Arthropathy, unspecified: Secondary | ICD-10-CM | POA: Diagnosis not present

## 2022-05-28 DIAGNOSIS — E1169 Type 2 diabetes mellitus with other specified complication: Secondary | ICD-10-CM | POA: Diagnosis not present

## 2022-05-28 DIAGNOSIS — F32A Depression, unspecified: Secondary | ICD-10-CM | POA: Diagnosis not present

## 2022-05-28 DIAGNOSIS — E7800 Pure hypercholesterolemia, unspecified: Secondary | ICD-10-CM | POA: Diagnosis not present

## 2022-05-28 DIAGNOSIS — K219 Gastro-esophageal reflux disease without esophagitis: Secondary | ICD-10-CM | POA: Diagnosis not present

## 2022-05-28 DIAGNOSIS — Z0130 Encounter for examination of blood pressure without abnormal findings: Secondary | ICD-10-CM | POA: Diagnosis not present

## 2022-05-28 DIAGNOSIS — Z79899 Other long term (current) drug therapy: Secondary | ICD-10-CM | POA: Diagnosis not present

## 2022-05-29 ENCOUNTER — Other Ambulatory Visit (HOSPITAL_COMMUNITY): Payer: Self-pay

## 2022-05-29 MED ORDER — GLIPIZIDE 5 MG PO TABS
5.0000 mg | ORAL_TABLET | Freq: Every day | ORAL | 0 refills | Status: DC
  Filled 2022-05-29: qty 90, 90d supply, fill #0

## 2022-05-29 MED ORDER — OMEPRAZOLE 40 MG PO CPDR
40.0000 mg | DELAYED_RELEASE_CAPSULE | Freq: Every day | ORAL | 2 refills | Status: DC
  Filled 2022-05-29: qty 30, 30d supply, fill #0
  Filled 2022-06-15 – 2022-07-17 (×3): qty 30, 30d supply, fill #1
  Filled 2022-08-15: qty 30, 30d supply, fill #2

## 2022-05-29 MED ORDER — AMLODIPINE BESYLATE 10 MG PO TABS
10.0000 mg | ORAL_TABLET | Freq: Every day | ORAL | 0 refills | Status: DC
  Filled 2022-05-29 – 2022-07-15 (×2): qty 90, 90d supply, fill #0

## 2022-05-29 MED ORDER — IBUPROFEN 800 MG PO TABS
800.0000 mg | ORAL_TABLET | Freq: Three times a day (TID) | ORAL | 0 refills | Status: DC | PRN
  Filled 2022-05-29: qty 90, 30d supply, fill #0

## 2022-05-29 MED ORDER — FARXIGA 10 MG PO TABS
10.0000 mg | ORAL_TABLET | Freq: Every day | ORAL | 2 refills | Status: DC
  Filled 2022-05-29 – 2022-06-26 (×2): qty 30, 30d supply, fill #0
  Filled 2022-07-26: qty 30, 30d supply, fill #1
  Filled 2022-08-31: qty 30, 30d supply, fill #2

## 2022-05-29 MED ORDER — ROSUVASTATIN CALCIUM 5 MG PO TABS
5.0000 mg | ORAL_TABLET | Freq: Every day | ORAL | 0 refills | Status: DC
  Filled 2022-05-29 – 2022-06-15 (×2): qty 90, 90d supply, fill #0

## 2022-05-29 MED ORDER — BUPRENORPHINE HCL-NALOXONE HCL 8-2 MG SL FILM
1.0000 | ORAL_FILM | Freq: Three times a day (TID) | SUBLINGUAL | 0 refills | Status: DC
  Filled 2022-05-29: qty 90, 30d supply, fill #0

## 2022-05-29 MED ORDER — DULOXETINE HCL 60 MG PO CPEP
60.0000 mg | ORAL_CAPSULE | Freq: Two times a day (BID) | ORAL | 2 refills | Status: DC
  Filled 2022-05-29: qty 60, 30d supply, fill #0
  Filled 2022-06-26: qty 60, 30d supply, fill #1
  Filled 2022-07-17 – 2022-07-26 (×2): qty 60, 30d supply, fill #2

## 2022-05-30 ENCOUNTER — Other Ambulatory Visit (HOSPITAL_COMMUNITY): Payer: Self-pay

## 2022-05-31 ENCOUNTER — Other Ambulatory Visit (HOSPITAL_COMMUNITY): Payer: Self-pay

## 2022-06-01 ENCOUNTER — Other Ambulatory Visit (HOSPITAL_COMMUNITY): Payer: Self-pay

## 2022-06-05 ENCOUNTER — Other Ambulatory Visit (HOSPITAL_COMMUNITY): Payer: Self-pay

## 2022-06-06 ENCOUNTER — Other Ambulatory Visit (HOSPITAL_COMMUNITY): Payer: Self-pay

## 2022-06-13 ENCOUNTER — Other Ambulatory Visit (HOSPITAL_COMMUNITY): Payer: Self-pay

## 2022-06-15 ENCOUNTER — Other Ambulatory Visit (HOSPITAL_COMMUNITY): Payer: Self-pay

## 2022-06-15 DIAGNOSIS — J302 Other seasonal allergic rhinitis: Secondary | ICD-10-CM | POA: Diagnosis not present

## 2022-06-15 DIAGNOSIS — K219 Gastro-esophageal reflux disease without esophagitis: Secondary | ICD-10-CM | POA: Diagnosis not present

## 2022-06-15 DIAGNOSIS — I10 Essential (primary) hypertension: Secondary | ICD-10-CM | POA: Diagnosis not present

## 2022-06-15 DIAGNOSIS — E7800 Pure hypercholesterolemia, unspecified: Secondary | ICD-10-CM | POA: Diagnosis not present

## 2022-06-15 DIAGNOSIS — R7309 Other abnormal glucose: Secondary | ICD-10-CM | POA: Diagnosis not present

## 2022-06-15 DIAGNOSIS — E1169 Type 2 diabetes mellitus with other specified complication: Secondary | ICD-10-CM | POA: Diagnosis not present

## 2022-06-15 DIAGNOSIS — F32A Depression, unspecified: Secondary | ICD-10-CM | POA: Diagnosis not present

## 2022-06-15 DIAGNOSIS — Z0130 Encounter for examination of blood pressure without abnormal findings: Secondary | ICD-10-CM | POA: Diagnosis not present

## 2022-06-15 DIAGNOSIS — H40053 Ocular hypertension, bilateral: Secondary | ICD-10-CM | POA: Diagnosis not present

## 2022-06-15 DIAGNOSIS — J069 Acute upper respiratory infection, unspecified: Secondary | ICD-10-CM | POA: Diagnosis not present

## 2022-06-15 MED ORDER — ROSUVASTATIN CALCIUM 5 MG PO TABS
5.0000 mg | ORAL_TABLET | Freq: Every day | ORAL | 0 refills | Status: DC
  Filled 2022-10-30: qty 60, 60d supply, fill #0

## 2022-06-15 MED ORDER — GABAPENTIN 800 MG PO TABS
800.0000 mg | ORAL_TABLET | Freq: Three times a day (TID) | ORAL | 0 refills | Status: DC
  Filled 2022-06-15: qty 90, 30d supply, fill #0

## 2022-06-15 MED ORDER — FLUTICASONE PROPIONATE 50 MCG/ACT NA SUSP
NASAL | 0 refills | Status: DC
  Filled 2022-06-15: qty 16, 30d supply, fill #0

## 2022-06-15 MED ORDER — AMOXICILLIN 500 MG PO CAPS
500.0000 mg | ORAL_CAPSULE | Freq: Two times a day (BID) | ORAL | 0 refills | Status: DC
  Filled 2022-06-15 (×2): qty 20, 10d supply, fill #0

## 2022-06-15 MED ORDER — LATANOPROST 0.005 % OP SOLN
OPHTHALMIC | 2 refills | Status: DC
  Filled 2022-06-15: qty 2.5, 30d supply, fill #0

## 2022-06-16 ENCOUNTER — Other Ambulatory Visit (HOSPITAL_COMMUNITY): Payer: Self-pay

## 2022-06-16 ENCOUNTER — Other Ambulatory Visit (HOSPITAL_BASED_OUTPATIENT_CLINIC_OR_DEPARTMENT_OTHER): Payer: Self-pay

## 2022-06-17 ENCOUNTER — Other Ambulatory Visit (HOSPITAL_COMMUNITY): Payer: Self-pay

## 2022-06-20 ENCOUNTER — Other Ambulatory Visit (HOSPITAL_COMMUNITY): Payer: Self-pay

## 2022-06-26 ENCOUNTER — Other Ambulatory Visit (HOSPITAL_COMMUNITY): Payer: Self-pay

## 2022-06-28 ENCOUNTER — Other Ambulatory Visit (HOSPITAL_COMMUNITY): Payer: Self-pay

## 2022-06-28 DIAGNOSIS — R7309 Other abnormal glucose: Secondary | ICD-10-CM | POA: Diagnosis not present

## 2022-06-28 DIAGNOSIS — M47816 Spondylosis without myelopathy or radiculopathy, lumbar region: Secondary | ICD-10-CM | POA: Diagnosis not present

## 2022-06-28 DIAGNOSIS — Z79899 Other long term (current) drug therapy: Secondary | ICD-10-CM | POA: Diagnosis not present

## 2022-06-28 DIAGNOSIS — M549 Dorsalgia, unspecified: Secondary | ICD-10-CM | POA: Diagnosis not present

## 2022-06-28 DIAGNOSIS — Z0130 Encounter for examination of blood pressure without abnormal findings: Secondary | ICD-10-CM | POA: Diagnosis not present

## 2022-06-28 DIAGNOSIS — G629 Polyneuropathy, unspecified: Secondary | ICD-10-CM | POA: Diagnosis not present

## 2022-06-28 DIAGNOSIS — M542 Cervicalgia: Secondary | ICD-10-CM | POA: Diagnosis not present

## 2022-06-28 DIAGNOSIS — M539 Dorsopathy, unspecified: Secondary | ICD-10-CM | POA: Diagnosis not present

## 2022-06-28 DIAGNOSIS — G8929 Other chronic pain: Secondary | ICD-10-CM | POA: Diagnosis not present

## 2022-06-28 DIAGNOSIS — I10 Essential (primary) hypertension: Secondary | ICD-10-CM | POA: Diagnosis not present

## 2022-06-28 DIAGNOSIS — M47812 Spondylosis without myelopathy or radiculopathy, cervical region: Secondary | ICD-10-CM | POA: Diagnosis not present

## 2022-06-28 MED ORDER — BUPRENORPHINE HCL-NALOXONE HCL 8-2 MG SL FILM
1.0000 | ORAL_FILM | Freq: Three times a day (TID) | SUBLINGUAL | 0 refills | Status: DC
  Filled 2022-06-28: qty 90, 30d supply, fill #0

## 2022-06-29 ENCOUNTER — Other Ambulatory Visit (HOSPITAL_COMMUNITY): Payer: Self-pay

## 2022-07-04 DIAGNOSIS — Z79899 Other long term (current) drug therapy: Secondary | ICD-10-CM | POA: Diagnosis not present

## 2022-07-11 ENCOUNTER — Other Ambulatory Visit (HOSPITAL_COMMUNITY): Payer: Self-pay

## 2022-07-15 ENCOUNTER — Other Ambulatory Visit: Payer: Self-pay | Admitting: Nurse Practitioner

## 2022-07-15 ENCOUNTER — Other Ambulatory Visit (HOSPITAL_COMMUNITY): Payer: Self-pay

## 2022-07-17 ENCOUNTER — Other Ambulatory Visit: Payer: Self-pay | Admitting: Nurse Practitioner

## 2022-07-17 ENCOUNTER — Other Ambulatory Visit (HOSPITAL_COMMUNITY): Payer: Self-pay

## 2022-07-17 DIAGNOSIS — I10 Essential (primary) hypertension: Secondary | ICD-10-CM

## 2022-07-17 MED ORDER — FUROSEMIDE 20 MG PO TABS
20.0000 mg | ORAL_TABLET | Freq: Every day | ORAL | 0 refills | Status: DC
  Filled 2022-07-17: qty 30, 30d supply, fill #0

## 2022-07-17 MED ORDER — GABAPENTIN 800 MG PO TABS
800.0000 mg | ORAL_TABLET | Freq: Three times a day (TID) | ORAL | 0 refills | Status: DC
  Filled 2022-07-17: qty 90, 30d supply, fill #0

## 2022-07-18 ENCOUNTER — Other Ambulatory Visit (HOSPITAL_COMMUNITY): Payer: Self-pay

## 2022-07-18 MED ORDER — CLONIDINE HCL 0.1 MG PO TABS
0.1000 mg | ORAL_TABLET | Freq: Three times a day (TID) | ORAL | 0 refills | Status: DC
  Filled 2022-07-18 – 2022-07-26 (×2): qty 30, 10d supply, fill #0

## 2022-07-19 ENCOUNTER — Other Ambulatory Visit (HOSPITAL_COMMUNITY): Payer: Self-pay

## 2022-07-26 ENCOUNTER — Other Ambulatory Visit (HOSPITAL_COMMUNITY): Payer: Self-pay

## 2022-07-28 ENCOUNTER — Other Ambulatory Visit: Payer: Self-pay

## 2022-07-28 ENCOUNTER — Other Ambulatory Visit (HOSPITAL_COMMUNITY): Payer: Self-pay

## 2022-07-29 ENCOUNTER — Other Ambulatory Visit (HOSPITAL_COMMUNITY): Payer: Self-pay

## 2022-07-30 ENCOUNTER — Other Ambulatory Visit (HOSPITAL_COMMUNITY): Payer: Self-pay

## 2022-07-30 DIAGNOSIS — M47812 Spondylosis without myelopathy or radiculopathy, cervical region: Secondary | ICD-10-CM | POA: Diagnosis not present

## 2022-07-30 DIAGNOSIS — M549 Dorsalgia, unspecified: Secondary | ICD-10-CM | POA: Diagnosis not present

## 2022-07-30 DIAGNOSIS — I10 Essential (primary) hypertension: Secondary | ICD-10-CM | POA: Diagnosis not present

## 2022-07-30 DIAGNOSIS — Z5181 Encounter for therapeutic drug level monitoring: Secondary | ICD-10-CM | POA: Diagnosis not present

## 2022-07-30 DIAGNOSIS — M539 Dorsopathy, unspecified: Secondary | ICD-10-CM | POA: Diagnosis not present

## 2022-07-30 DIAGNOSIS — G8929 Other chronic pain: Secondary | ICD-10-CM | POA: Diagnosis not present

## 2022-07-30 DIAGNOSIS — Z0130 Encounter for examination of blood pressure without abnormal findings: Secondary | ICD-10-CM | POA: Diagnosis not present

## 2022-07-30 DIAGNOSIS — R748 Abnormal levels of other serum enzymes: Secondary | ICD-10-CM | POA: Diagnosis not present

## 2022-07-30 DIAGNOSIS — G629 Polyneuropathy, unspecified: Secondary | ICD-10-CM | POA: Diagnosis not present

## 2022-07-30 DIAGNOSIS — M542 Cervicalgia: Secondary | ICD-10-CM | POA: Diagnosis not present

## 2022-07-30 DIAGNOSIS — Z79899 Other long term (current) drug therapy: Secondary | ICD-10-CM | POA: Diagnosis not present

## 2022-07-30 MED ORDER — GABAPENTIN 800 MG PO TABS
800.0000 mg | ORAL_TABLET | Freq: Three times a day (TID) | ORAL | 0 refills | Status: DC
  Filled 2022-08-15 (×2): qty 90, 30d supply, fill #0

## 2022-07-30 MED ORDER — BUPRENORPHINE HCL-NALOXONE HCL 8-2 MG SL FILM
ORAL_FILM | SUBLINGUAL | 0 refills | Status: DC
  Filled 2022-07-30: qty 90, 30d supply, fill #0

## 2022-07-30 MED ORDER — CLONIDINE HCL 0.1 MG PO TABS
0.1000 mg | ORAL_TABLET | Freq: Three times a day (TID) | ORAL | 0 refills | Status: DC
  Filled 2022-07-30 – 2022-08-15 (×2): qty 270, 90d supply, fill #0
  Filled 2022-08-15: qty 90, 30d supply, fill #0

## 2022-07-31 ENCOUNTER — Other Ambulatory Visit (HOSPITAL_COMMUNITY): Payer: Self-pay

## 2022-08-15 ENCOUNTER — Other Ambulatory Visit: Payer: Self-pay

## 2022-08-15 ENCOUNTER — Other Ambulatory Visit: Payer: Self-pay | Admitting: Nurse Practitioner

## 2022-08-15 ENCOUNTER — Other Ambulatory Visit (HOSPITAL_COMMUNITY): Payer: Self-pay

## 2022-08-16 ENCOUNTER — Ambulatory Visit (INDEPENDENT_AMBULATORY_CARE_PROVIDER_SITE_OTHER): Payer: Medicare Other

## 2022-08-16 DIAGNOSIS — Z0000 Encounter for general adult medical examination without abnormal findings: Secondary | ICD-10-CM

## 2022-08-25 ENCOUNTER — Other Ambulatory Visit: Payer: Self-pay

## 2022-08-25 DIAGNOSIS — G629 Polyneuropathy, unspecified: Secondary | ICD-10-CM | POA: Diagnosis not present

## 2022-08-25 DIAGNOSIS — M47812 Spondylosis without myelopathy or radiculopathy, cervical region: Secondary | ICD-10-CM | POA: Diagnosis not present

## 2022-08-25 DIAGNOSIS — M542 Cervicalgia: Secondary | ICD-10-CM | POA: Diagnosis not present

## 2022-08-25 DIAGNOSIS — R748 Abnormal levels of other serum enzymes: Secondary | ICD-10-CM | POA: Diagnosis not present

## 2022-08-25 DIAGNOSIS — S90415A Abrasion, left lesser toe(s), initial encounter: Secondary | ICD-10-CM | POA: Diagnosis not present

## 2022-08-25 DIAGNOSIS — I10 Essential (primary) hypertension: Secondary | ICD-10-CM | POA: Diagnosis not present

## 2022-08-25 DIAGNOSIS — M79604 Pain in right leg: Secondary | ICD-10-CM | POA: Diagnosis not present

## 2022-08-25 DIAGNOSIS — Z79899 Other long term (current) drug therapy: Secondary | ICD-10-CM | POA: Diagnosis not present

## 2022-08-25 DIAGNOSIS — G8929 Other chronic pain: Secondary | ICD-10-CM | POA: Diagnosis not present

## 2022-08-25 DIAGNOSIS — M539 Dorsopathy, unspecified: Secondary | ICD-10-CM | POA: Diagnosis not present

## 2022-08-25 DIAGNOSIS — M549 Dorsalgia, unspecified: Secondary | ICD-10-CM | POA: Diagnosis not present

## 2022-08-25 DIAGNOSIS — Z0130 Encounter for examination of blood pressure without abnormal findings: Secondary | ICD-10-CM | POA: Diagnosis not present

## 2022-08-28 ENCOUNTER — Other Ambulatory Visit (HOSPITAL_COMMUNITY): Payer: Self-pay

## 2022-08-28 ENCOUNTER — Emergency Department (HOSPITAL_COMMUNITY)
Admission: EM | Admit: 2022-08-28 | Discharge: 2022-08-28 | Disposition: A | Discharge status: Home / Self Care | Payer: 59 | Attending: Emergency Medicine | Admitting: Emergency Medicine

## 2022-08-28 ENCOUNTER — Other Ambulatory Visit: Payer: Self-pay

## 2022-08-28 ENCOUNTER — Encounter (HOSPITAL_COMMUNITY): Payer: Self-pay

## 2022-08-28 DIAGNOSIS — I10 Essential (primary) hypertension: Secondary | ICD-10-CM | POA: Insufficient documentation

## 2022-08-28 DIAGNOSIS — E119 Type 2 diabetes mellitus without complications: Secondary | ICD-10-CM | POA: Diagnosis not present

## 2022-08-28 DIAGNOSIS — Z79899 Other long term (current) drug therapy: Secondary | ICD-10-CM | POA: Diagnosis not present

## 2022-08-28 DIAGNOSIS — L600 Ingrowing nail: Secondary | ICD-10-CM | POA: Insufficient documentation

## 2022-08-28 DIAGNOSIS — M79675 Pain in left toe(s): Secondary | ICD-10-CM | POA: Diagnosis present

## 2022-08-28 LAB — CBG MONITORING, ED: Glucose-Capillary: 103 mg/dL — ABNORMAL HIGH (ref 70–99)

## 2022-08-28 MED ORDER — AMLODIPINE BESYLATE 10 MG PO TABS
10.0000 mg | ORAL_TABLET | Freq: Every day | ORAL | 0 refills | Status: DC
  Filled 2022-08-28 (×2): qty 30, 30d supply, fill #0

## 2022-08-28 MED ORDER — DOXYCYCLINE HYCLATE 100 MG PO CAPS
100.0000 mg | ORAL_CAPSULE | Freq: Two times a day (BID) | ORAL | 0 refills | Status: DC
  Filled 2022-08-28: qty 20, 10d supply, fill #0

## 2022-08-28 NOTE — ED Triage Notes (Signed)
Patient has redness, swelling, and pain to the left great toe x 3-4 days.  BP in triage 151/114. Patient states she lost her Amlodipine and has not had in 1 week.

## 2022-08-28 NOTE — ED Provider Triage Note (Signed)
Emergency Medicine Provider Triage Evaluation Note  Leah Barber , a 47 y.o. female  was evaluated in triage.  Pt complains of left toe pain.  Patient reports that she was trying to treat ingrown toenail at home and she feels that she now has an infection as she has some pus coming from the wound site.  She reports that the site is tender and warm to the touch.  Patient is unreliable in trying to account for diabetic control.  Denies fever, nausea, vomiting.  Review of Systems  Positive: As above Negative: As above  Physical Exam  BP (!) 151/114 (BP Location: Right Arm)   Pulse 96   Temp 97.9 F (36.6 C) (Oral)   Resp 17   LMP 09/14/2019 (Approximate)   SpO2 90%  Gen:   Awake, no distress   Resp:  Normal effort  MSK:   Moves extremities without difficulty  Other:  Erythematous lesion medial aspect of left big toe.  There is some area of fluctuance but no obvious discharge noted when palpating toe.  Medical Decision Making  Medically screening exam initiated at 12:36 PM.  Appropriate orders placed.  Chalmers Cater was informed that the remainder of the evaluation will be completed by another provider, this initial triage assessment does not replace that evaluation, and the importance of remaining in the ED until their evaluation is complete.     Luvenia Heller, PA-C 08/28/22 1238

## 2022-08-28 NOTE — ED Provider Notes (Signed)
[facility] DEPT Provider Note   CSN: [id number] Arrival date & time: 08/28/22  1158     History  Chief Complaint  Patient presents with   Toe Pain    Leah Barber is a 47 y.o. female.  47 year old female with past medical history of diabetes and hypertension presents with concern for ingrown toenail with infection to her left great toe.  States that she has been trying to remove the ingrown toenail unsuccessfully, has had some bloody, nonpurulent drainage from the toe.  Onset of symptoms after cutting her toenails at home.  Does not see a podiatrist.  Reports needing a refill of her amlodipine and states that her PCP recently discontinued her Glipizide.        Home Medications Prior to Admission medications   Medication Sig Start Date End Date Taking? Authorizing Provider  doxycycline (VIBRAMYCIN) 100 MG capsule Take 1 capsule (100 mg total) by mouth 2 (two) times daily. 08/28/22  Yes [name], PA-C  Albuterol Sulfate (PROAIR RESPICLICK) 426 (90 Base) MCG/ACT AEPB Inhale 2 puffs into the lungs 4 (four) times daily as needed. 05/17/20 05/17/21  [name], NP  amLODipine (NORVASC) 10 MG tablet Take 1 tablet (10 mg total) by mouth daily. 08/28/22 09/27/22  [name], PA-C  Buprenorphine HCl-Naloxone HCl 8-2 MG FILM Place 1 film under tongue three times daily 07/30/22     buprenorphine-naloxone (SUBOXONE) 8-2 mg SUBL SL tablet Place 1 tablet under the tongue 3 times daily as directed 02/16/22     cetirizine (ZYRTEC) 10 MG tablet TAKE 1 TABLET BY MOUTH EVERY DAY 04/11/21   [name], NP  cloNIDine (CATAPRES) 0.1 MG tablet Take 1 tablet (0.1 mg total) by mouth 3 (three) times daily. 83/41/96     CONCERTA 18 MG CR tablet Take 18 mg by mouth daily as needed. 05/17/20   [provider]  dapagliflozin propanediol (FARXIGA) 10 MG TABS tablet Take 1 tablet (10 mg total) by mouth daily. 02/04/22     dapagliflozin propanediol (FARXIGA) 10 MG  TABS tablet Take 1 tablet by mouth daily 03/05/22     dapagliflozin propanediol (FARXIGA) 10 MG TABS tablet Take 1 tablet (10 mg total) by mouth daily. 05/28/22     dapagliflozin propanediol (FARXIGA) 5 MG TABS tablet Take 1 tablet by mouth daily 01/05/22     diclofenac (VOLTAREN) 50 MG EC tablet Take 1 tablet by mouth 3 times a day for pain 01/26/22     Diclofenac Sodium CR 100 MG 24 hr tablet Take 1 tablet by mouth daily 04/13/22     DULoxetine (CYMBALTA) 30 MG capsule Take 1 capsule by mouth 2 times a day 01/05/22     DULoxetine (CYMBALTA) 30 MG capsule Take 1 capsule by mouth 2 times daily. 02/27/22     DULoxetine (CYMBALTA) 30 MG capsule Take 1 capsule by mouth 2 times daily. 02/27/22     DULoxetine (CYMBALTA) 60 MG capsule Take 1 capsule (60 mg total) by mouth 2 (two) times daily. 05/28/22     fluticasone (FLONASE) 50 MCG/ACT nasal spray Place 2 sprays into both nostrils daily. 07/25/21   [name], NP  fluticasone Asencion Islam) 50 MCG/ACT nasal spray Use 1 spray in each nostril daily at bedtime 03/05/22     fluticasone (FLONASE) 50 MCG/ACT nasal spray Spray one spray into each nostril at bedtime. 04/04/22     fluticasone (FLONASE) 50 MCG/ACT nasal spray Use 1 spray in each nostril daily at  bedtime 04/10/22     fluticasone (FLONASE) 50 MCG/ACT nasal spray Place 1 spray daily at bedtime in each nostril 04/12/22     fluticasone (FLONASE) 50 MCG/ACT nasal spray Place 1 spray in each nostril daily at bedtime 06/15/22     furosemide (LASIX) 20 MG tablet Take 1 tablet (20 mg total) by mouth daily. 07/17/22 01/13/23  Fenton Foy, NP  gabapentin (NEURONTIN) 800 MG tablet TAKE 1 TABLET BY MOUTH EVERY 8 HOURS 01/09/22   Fenton Foy, NP  gabapentin (NEURONTIN) 800 MG tablet Take 1 tablet (800 mg total) by mouth 3 (three) times daily. 07/30/22     hydrOXYzine (VISTARIL) 50 MG capsule Take 1 capsule (50 mg total) by mouth 4 (four) times daily as needed for anxiety or nausea. 09/08/21   [name], PA-C   ibuprofen (ADVIL) 800 MG tablet Take 1 tablet (800 mg total) by mouth 3 (three) times daily with meals as needed. 05/28/22     latanoprost (XALATAN) 0.005 % ophthalmic solution Instill 1 drop in eyes daily as directed 01/11/22     latanoprost (XALATAN) 0.005 % ophthalmic solution Place 1 drop in both eyes daily 02/04/22     latanoprost (XALATAN) 0.005 % ophthalmic solution Instill 1 Drop in eye(s) daily 03/05/22     latanoprost (XALATAN) 0.005 % ophthalmic solution Place 1 drop in both eyes daily. 06/15/22     mirtazapine (REMERON) 15 MG tablet Take 1 tablet by mouth at bedtime for major depressive disorder 02/16/22     Multiple Vitamins-Minerals (WOMENS MULTIVITAMIN PO) Take 1 tablet by mouth daily.     [provider]  omeprazole (PRILOSEC) 20 MG capsule Take 1 capsule (20 mg total) by mouth daily. 02/04/22     omeprazole (PRILOSEC) 20 MG capsule Take 1 capsule by mouth daily. 02/27/22     omeprazole (PRILOSEC) 20 MG capsule Take 1 capsule by mouth daily. 02/27/22     omeprazole (PRILOSEC) 20 MG capsule Take 1 capsule by mouth daily 03/05/22     omeprazole (PRILOSEC) 20 MG capsule Take 1 capsule (20 mg total) by mouth daily. 05/02/22     omeprazole (PRILOSEC) 40 MG capsule Take 1 capsule (40 mg total) by mouth daily. 07/25/21 01/21/22  [name], NP  omeprazole (PRILOSEC) 40 MG capsule Take 1 capsule (40 mg total) by mouth daily. 05/28/22     rosuvastatin (CRESTOR) 5 MG tablet Take 1 tablet (5 mg total) by mouth daily. 07/25/21 01/21/22  [name], NP  rosuvastatin (CRESTOR) 5 MG tablet Take 1 tablet by mouth daily. 05/08/22     rosuvastatin (CRESTOR) 5 MG tablet Take 1 tablet (5 mg total) by mouth daily. 05/28/22     rosuvastatin (CRESTOR) 5 MG tablet Take 1 tablet (5 mg total) by mouth daily. 06/15/22         Allergies    Clindamycin/lincomycin, Other, Hydrocodone, and Morphine    Review of Systems   Review of Systems Negative except as per HPI Physical Exam Updated Vital Signs BP  (!) 151/114 (BP Location: Right Arm)   Pulse 96   Temp 97.9 F (36.6 C) (Oral)   Resp 17   Ht '5\' 2"'$  (1.575 m)   Wt 72.1 kg   LMP 09/14/2019 (Approximate)   SpO2 90%   BMI 29.08 kg/m  Physical Exam Vitals and nursing note reviewed.  Constitutional:      General: She is not in acute distress.    Appearance: She is well-developed. She is not diaphoretic.  HENT:     Head: Normocephalic and atraumatic.  Cardiovascular:     Pulses: Normal pulses.  Pulmonary:     Effort: Pulmonary effort is normal.  Musculoskeletal:     Comments: Swelling and erythema to distal phalanx left great toe with fluctuance around cuticle, no active drainage.   Skin:    General: Skin is warm and dry.  Neurological:     Mental Status: She is alert and oriented to person, place, and time.  Psychiatric:        Behavior: Behavior normal.     ED Results / Procedures / Treatments   Labs (all labs ordered are listed, but only abnormal results are displayed) Labs Reviewed  CBG MONITORING, ED - Abnormal; Notable for the following components:      Result Value   Glucose-Capillary 103 (*)    All other components within normal limits    EKG None  Radiology No results found.  Procedures Procedures    Medications Ordered in ED Medications - No data to display  ED Course/ Medical Decision Making/ A&P                           Medical Decision Making Risk Prescription drug management.    47 year old female with left great toenail likely infected, ingrown after trimming her own nails at home and trying to squeeze drainage from the nail.  Recommend I&D of area, patient declines at this time.  Is agreeable to oral antibiotics, warm soaks at home and referral to podiatry.  Will also place in postop shoe for comfort.        Final Clinical Impression(s) / ED Diagnoses Final diagnoses:  Ingrown toenail with infection    Rx / DC Orders ED Discharge Orders          Ordered    amLODipine  (NORVASC) 10 MG tablet  Daily        08/28/22 1320    doxycycline (VIBRAMYCIN) 100 MG capsule  2 times daily        08/28/22 1357              Roque Lias 08/28/22 1358    Valarie Merino, MD 08/28/22 [phone]

## 2022-08-28 NOTE — Discharge Instructions (Addendum)
Take antibiotics as prescribed. Soak in warm water with Epsom salt for 20 minutes at a time, at least three times daily. Follow up with podiatry, call today to schedule an appointment.   Follow up with your PCP as scheduled for medication refills and management. Your Amlodipine was refilled today.

## 2022-08-31 ENCOUNTER — Other Ambulatory Visit (HOSPITAL_COMMUNITY): Payer: Self-pay

## 2022-08-31 ENCOUNTER — Other Ambulatory Visit: Payer: Self-pay | Admitting: Nurse Practitioner

## 2022-09-01 ENCOUNTER — Other Ambulatory Visit (HOSPITAL_COMMUNITY): Payer: Self-pay

## 2022-09-05 ENCOUNTER — Other Ambulatory Visit (HOSPITAL_COMMUNITY): Payer: Self-pay

## 2022-09-05 DIAGNOSIS — G629 Polyneuropathy, unspecified: Secondary | ICD-10-CM | POA: Diagnosis not present

## 2022-09-05 DIAGNOSIS — K760 Fatty (change of) liver, not elsewhere classified: Secondary | ICD-10-CM | POA: Diagnosis not present

## 2022-09-05 DIAGNOSIS — F32A Depression, unspecified: Secondary | ICD-10-CM | POA: Diagnosis not present

## 2022-09-05 DIAGNOSIS — I10 Essential (primary) hypertension: Secondary | ICD-10-CM | POA: Diagnosis not present

## 2022-09-05 DIAGNOSIS — Z79899 Other long term (current) drug therapy: Secondary | ICD-10-CM | POA: Diagnosis not present

## 2022-09-05 DIAGNOSIS — M539 Dorsopathy, unspecified: Secondary | ICD-10-CM | POA: Diagnosis not present

## 2022-09-05 DIAGNOSIS — N289 Disorder of kidney and ureter, unspecified: Secondary | ICD-10-CM | POA: Diagnosis not present

## 2022-09-05 DIAGNOSIS — M549 Dorsalgia, unspecified: Secondary | ICD-10-CM | POA: Diagnosis not present

## 2022-09-05 DIAGNOSIS — Z5181 Encounter for therapeutic drug level monitoring: Secondary | ICD-10-CM | POA: Diagnosis not present

## 2022-09-05 MED ORDER — GABAPENTIN 800 MG PO TABS
800.0000 mg | ORAL_TABLET | Freq: Three times a day (TID) | ORAL | 0 refills | Status: DC
  Filled 2022-09-05 – 2022-09-22 (×2): qty 90, 30d supply, fill #0

## 2022-09-05 MED ORDER — CLONIDINE HCL 0.1 MG PO TABS
0.1000 mg | ORAL_TABLET | Freq: Three times a day (TID) | ORAL | 0 refills | Status: DC
  Filled 2022-09-05: qty 270, 90d supply, fill #0

## 2022-09-05 MED ORDER — FUROSEMIDE 20 MG PO TABS
20.0000 mg | ORAL_TABLET | Freq: Every day | ORAL | 0 refills | Status: DC
  Filled 2022-09-05: qty 30, 30d supply, fill #0

## 2022-09-05 MED ORDER — DAPAGLIFLOZIN PROPANEDIOL 10 MG PO TABS
10.0000 mg | ORAL_TABLET | Freq: Every day | ORAL | 2 refills | Status: DC
  Filled 2022-09-05: qty 30, 30d supply, fill #0

## 2022-09-05 MED ORDER — ROSUVASTATIN CALCIUM 5 MG PO TABS
5.0000 mg | ORAL_TABLET | Freq: Every day | ORAL | 0 refills | Status: DC
  Filled 2022-09-05: qty 90, 90d supply, fill #0

## 2022-09-05 MED ORDER — FLUTICASONE PROPIONATE 50 MCG/ACT NA SUSP
1.0000 | Freq: Every evening | NASAL | 0 refills | Status: DC
  Filled 2022-09-05: qty 16, 60d supply, fill #0

## 2022-09-05 MED ORDER — BUPROPION HCL ER (SR) 150 MG PO TB12
150.0000 mg | ORAL_TABLET | Freq: Two times a day (BID) | ORAL | 1 refills | Status: DC
  Filled 2022-09-05: qty 60, 30d supply, fill #0

## 2022-09-05 MED ORDER — GLIPIZIDE 5 MG PO TABS
5.0000 mg | ORAL_TABLET | Freq: Every day | ORAL | 0 refills | Status: DC
  Filled 2022-09-05: qty 90, 90d supply, fill #0

## 2022-09-05 MED ORDER — OMEPRAZOLE 40 MG PO CPDR
40.0000 mg | DELAYED_RELEASE_CAPSULE | Freq: Every day | ORAL | 2 refills | Status: DC
  Filled 2022-09-05 – 2023-01-16 (×3): qty 30, 30d supply, fill #0

## 2022-09-05 MED ORDER — BUPRENORPHINE HCL-NALOXONE HCL 8-2 MG SL FILM
1.0000 | ORAL_FILM | Freq: Three times a day (TID) | SUBLINGUAL | 0 refills | Status: DC
  Filled 2022-09-05: qty 90, 30d supply, fill #0

## 2022-09-05 MED ORDER — LATANOPROST 0.005 % OP SOLN
1.0000 [drp] | Freq: Every day | OPHTHALMIC | 2 refills | Status: DC
  Filled 2022-09-05: qty 2.5, 25d supply, fill #0
  Filled 2023-07-25: qty 2.5, 25d supply, fill #1

## 2022-09-05 MED ORDER — AMLODIPINE BESYLATE 10 MG PO TABS
10.0000 mg | ORAL_TABLET | Freq: Every day | ORAL | 0 refills | Status: DC
  Filled 2022-09-05 – 2023-05-26 (×2): qty 90, 90d supply, fill #0

## 2022-09-06 ENCOUNTER — Ambulatory Visit (INDEPENDENT_AMBULATORY_CARE_PROVIDER_SITE_OTHER): Payer: 59 | Admitting: Podiatry

## 2022-09-06 VITALS — BP 115/80

## 2022-09-06 DIAGNOSIS — L600 Ingrowing nail: Secondary | ICD-10-CM | POA: Diagnosis not present

## 2022-09-06 NOTE — Patient Instructions (Signed)

## 2022-09-06 NOTE — Progress Notes (Signed)
Subjective:  Patient ID: Leah Barber, female    DOB: Dec 05, 1975,  MRN: [id number]  Chief Complaint  Patient presents with   Ingrown Toenail    47 y.o. female presents with the above complaint.  Patient presents with right hallux medial border ingrown pain for touch is progressive gotten worse worse with ambulation worse with pressure she would like to get evaluated she would like to have removed pain scale 7 out of 10 dull aching nature she is a diabetic last A1c of 6.6%   Review of Systems: Negative except as noted in the HPI. Denies N/V/F/Ch.  Past Medical History:  Diagnosis Date   Allergy    Anemia    Anxiety    Arthritis    Asthma    Chronic lower back pain    Depression    Diabetes mellitus without complication (HCC)    Dyspnea    GERD (gastroesophageal reflux disease)    Glaucoma    Hyperlipemia    Hypertension    IIH (idiopathic intracranial hypertension)    Pseudotumor cerebri 08/13/2019   Substance abuse ([place])    Rx drugs for pain medication. Has not had in 5 years.    Current Outpatient Medications:    Albuterol Sulfate (PROAIR RESPICLICK) 601 (90 Base) MCG/ACT AEPB, Inhale 2 puffs into the lungs 4 (four) times daily as needed., Disp: 1 each, Rfl: 5   amLODipine (NORVASC) 10 MG tablet, Take 1 tablet (10 mg total) by mouth daily., Disp: 90 tablet, Rfl: 0   Buprenorphine HCl-Naloxone HCl 8-2 MG FILM, Place 1 Film under the tongue 3 (three) times daily., Disp: 90 Film, Rfl: 0   buprenorphine-naloxone (SUBOXONE) 8-2 mg SUBL SL tablet, Place 1 tablet under the tongue 3 times daily as directed, Disp: 21 tablet, Rfl: 0   buPROPion (WELLBUTRIN SR) 150 MG 12 hr tablet, Take 1 tablet (150 mg total) by mouth daily for 3 days then take 1 tablet 2 (two) times daily., Disp: 60 tablet, Rfl: 1   cetirizine (ZYRTEC) 10 MG tablet, TAKE 1 TABLET BY MOUTH EVERY DAY, Disp: 90 tablet, Rfl: 3   cloNIDine (CATAPRES) 0.1 MG tablet, Take 1 tablet (0.1 mg total) by mouth 3 (three) times  daily., Disp: 270 tablet, Rfl: 0   CONCERTA 18 MG CR tablet, Take 18 mg by mouth daily as needed., Disp: , Rfl:    dapagliflozin propanediol (FARXIGA) 10 MG TABS tablet, Take 1 tablet (10 mg total) by mouth daily., Disp: 30 tablet, Rfl: 2   dapagliflozin propanediol (FARXIGA) 10 MG TABS tablet, Take 1 tablet by mouth daily, Disp: 30 tablet, Rfl: 2   dapagliflozin propanediol (FARXIGA) 10 MG TABS tablet, Take 1 tablet (10 mg total) by mouth daily., Disp: 30 tablet, Rfl: 2   dapagliflozin propanediol (FARXIGA) 10 MG TABS tablet, Take 1 tablet (10 mg total) by mouth daily., Disp: 30 tablet, Rfl: 2   dapagliflozin propanediol (FARXIGA) 5 MG TABS tablet, Take 1 tablet by mouth daily, Disp: 30 tablet, Rfl: 2   diclofenac (VOLTAREN) 50 MG EC tablet, Take 1 tablet by mouth 3 times a day for pain, Disp: 90 tablet, Rfl: 0   Diclofenac Sodium CR 100 MG 24 hr tablet, Take 1 tablet by mouth daily, Disp: 14 tablet, Rfl: 0   doxycycline (VIBRAMYCIN) 100 MG capsule, Take 1 capsule (100 mg total) by mouth 2 (two) times daily., Disp: 20 capsule, Rfl: 0   DULoxetine (CYMBALTA) 30 MG capsule, Take 1 capsule by mouth 2 times a day, Disp:  60 capsule, Rfl: 2   DULoxetine (CYMBALTA) 30 MG capsule, Take 1 capsule by mouth 2 times daily., Disp: 60 capsule, Rfl: 2   DULoxetine (CYMBALTA) 30 MG capsule, Take 1 capsule by mouth 2 times daily., Disp: 60 capsule, Rfl: 2   DULoxetine (CYMBALTA) 60 MG capsule, Take 1 capsule (60 mg total) by mouth 2 (two) times daily., Disp: 60 capsule, Rfl: 2   fluticasone (FLONASE) 50 MCG/ACT nasal spray, Place 2 sprays into both nostrils daily., Disp: 48 g, Rfl: 1   fluticasone (FLONASE) 50 MCG/ACT nasal spray, Use 1 spray in each nostril daily at bedtime, Disp: 16 g, Rfl: 0   fluticasone (FLONASE) 50 MCG/ACT nasal spray, Spray one spray into each nostril at bedtime., Disp: 16 g, Rfl: 0   fluticasone (FLONASE) 50 MCG/ACT nasal spray, Place 1 spray daily at bedtime in each nostril, Disp: 16 g,  Rfl: 0   fluticasone (FLONASE) 50 MCG/ACT nasal spray, Place 1 spray in each nostril daily at bedtime, Disp: 16 g, Rfl: 0   fluticasone (FLONASE) 50 MCG/ACT nasal spray, Place 1 spray into both nostrils at bedtime., Disp: 16 g, Rfl: 0   furosemide (LASIX) 20 MG tablet, Take 1 tablet (20 mg total) by mouth daily., Disp: 30 tablet, Rfl: 0   gabapentin (NEURONTIN) 800 MG tablet, TAKE 1 TABLET BY MOUTH EVERY 8 HOURS, Disp: 90 tablet, Rfl: 2   gabapentin (NEURONTIN) 800 MG tablet, Take 1 tablet (800 mg total) by mouth 3 (three) times daily., Disp: 90 tablet, Rfl: 0   glipiZIDE (GLUCOTROL) 5 MG tablet, Take 1 tablet (5 mg total) by mouth daily., Disp: 90 tablet, Rfl: 0   hydrOXYzine (VISTARIL) 50 MG capsule, Take 1 capsule (50 mg total) by mouth 4 (four) times daily as needed for anxiety or nausea., Disp: 30 capsule, Rfl: 0   ibuprofen (ADVIL) 800 MG tablet, Take 1 tablet (800 mg total) by mouth 3 (three) times daily with meals as needed., Disp: 90 tablet, Rfl: 0   latanoprost (XALATAN) 0.005 % ophthalmic solution, Instill 1 drop in eyes daily as directed, Disp: 2.5 mL, Rfl: 0   latanoprost (XALATAN) 0.005 % ophthalmic solution, Place 1 drop in both eyes daily, Disp: 2.5 mL, Rfl: 2   latanoprost (XALATAN) 0.005 % ophthalmic solution, Instill 1 Drop in eye(s) daily, Disp: 2.5 mL, Rfl: 2   latanoprost (XALATAN) 0.005 % ophthalmic solution, Place 1 drop in both eyes daily., Disp: 2.5 mL, Rfl: 2   latanoprost (XALATAN) 0.005 % ophthalmic solution, Place 1 drop into both eyes daily., Disp: 2.5 mL, Rfl: 2   mirtazapine (REMERON) 15 MG tablet, Take 1 tablet by mouth at bedtime for major depressive disorder, Disp: 30 tablet, Rfl: 0   Multiple Vitamins-Minerals (WOMENS MULTIVITAMIN PO), Take 1 tablet by mouth daily. , Disp: , Rfl:    omeprazole (PRILOSEC) 20 MG capsule, Take 1 capsule (20 mg total) by mouth daily., Disp: 30 capsule, Rfl: 2   omeprazole (PRILOSEC) 20 MG capsule, Take 1 capsule by mouth daily.,  Disp: 30 capsule, Rfl: 2   omeprazole (PRILOSEC) 20 MG capsule, Take 1 capsule by mouth daily., Disp: 30 capsule, Rfl: 2   omeprazole (PRILOSEC) 20 MG capsule, Take 1 capsule by mouth daily, Disp: 30 capsule, Rfl: 2   omeprazole (PRILOSEC) 20 MG capsule, Take 1 capsule (20 mg total) by mouth daily., Disp: 30 capsule, Rfl: 2   omeprazole (PRILOSEC) 40 MG capsule, Take 1 capsule (40 mg total) by mouth daily., Disp: 90 capsule, Rfl: 1  omeprazole (PRILOSEC) 40 MG capsule, Take 1 capsule (40 mg total) by mouth daily., Disp: 30 capsule, Rfl: 2   rosuvastatin (CRESTOR) 5 MG tablet, Take 1 tablet (5 mg total) by mouth daily., Disp: 30 tablet, Rfl: 5   rosuvastatin (CRESTOR) 5 MG tablet, Take 1 tablet by mouth daily., Disp: 30 tablet, Rfl: 0   rosuvastatin (CRESTOR) 5 MG tablet, Take 1 tablet (5 mg total) by mouth daily., Disp: 90 tablet, Rfl: 0   rosuvastatin (CRESTOR) 5 MG tablet, Take 1 tablet (5 mg total) by mouth daily., Disp: 90 tablet, Rfl: 0   rosuvastatin (CRESTOR) 5 MG tablet, Take 1 tablet (5 mg total) by mouth daily., Disp: 90 tablet, Rfl: 0  Social History   Tobacco Use  Smoking Status Every Day   Packs/day: 0.25   Types: Cigarettes  Smokeless Tobacco Never    Allergies  Allergen Reactions   Clindamycin/Lincomycin Nausea Only   Other Other (See Comments)    REFUSED BLOOD TRANSFUSION AND BLOOD PRODUCTS   Hydrocodone Nausea And Vomiting and Other (See Comments)    stomach pain    Morphine Nausea And Vomiting   Objective:   Vitals:   09/06/22 1313  BP: 115/80   There is no height or weight on file to calculate BMI. Constitutional Well developed. Well nourished.  Vascular Dorsalis pedis pulses palpable bilaterally. Posterior tibial pulses palpable bilaterally. Capillary refill normal to all digits.  No cyanosis or clubbing noted. Pedal hair growth normal.  Neurologic Normal speech. Oriented to person, place, and time. Epicritic sensation to light touch grossly present  bilaterally.  Dermatologic Painful ingrowing nail at medial nail borders of the hallux nail right. No other open wounds. No skin lesions.  Orthopedic: Normal joint ROM without pain or crepitus bilaterally. No visible deformities. No bony tenderness.   Radiographs: None Assessment:  No diagnosis found. Plan:  Patient was evaluated and treated and all questions answered.  Ingrown Nail, right -Patient elects to proceed with minor surgery to remove ingrown toenail removal today. Consent reviewed and signed by patient. -Ingrown nail excised. See procedure note. -Educated on post-procedure care including soaking. Written instructions provided and reviewed. -Patient to follow up in 2 weeks for nail check.  Procedure: Excision of Ingrown Toenail Location: Right 1st toe medial nail borders. Anesthesia: Lidocaine 1% plain; 1.5 mL and Marcaine 0.5% plain; 1.5 mL, digital block. Skin Prep: Betadine. Dressing: Silvadene; telfa; dry, sterile, compression dressing. Technique: Following skin prep, the toe was exsanguinated and a tourniquet was secured at the base of the toe. The affected nail border was freed, split with a nail splitter, and excised. Chemical matrixectomy was then performed with phenol and irrigated out with alcohol. The tourniquet was then removed and sterile dressing applied. Disposition: Patient tolerated procedure well. Patient to return in 2 weeks for follow-up.   No follow-ups on file.

## 2022-09-08 ENCOUNTER — Telehealth: Payer: Self-pay

## 2022-09-08 NOTE — Telephone Encounter (Signed)
     Patient  visit on 08/28/2022  at [facility] was for Ingrown toenail with infection.  Have you been able to follow up with your primary care physician? Patient has seen her Podiatrist.  The patient was or was not able to obtain any needed medicine or equipment. Patient obtained medication.  Are there diet recommendations that you are having difficulty following? No  Patient expresses understanding of discharge instructions and education provided has no other needs at this time.    [facility] Resource Care Guide   [email or web address]  ?? [phone]   Website: [email or web address]  [email or web address]

## 2022-09-11 ENCOUNTER — Other Ambulatory Visit (HOSPITAL_COMMUNITY): Payer: Self-pay

## 2022-09-12 ENCOUNTER — Other Ambulatory Visit (HOSPITAL_COMMUNITY): Payer: Self-pay

## 2022-09-12 DIAGNOSIS — Z79899 Other long term (current) drug therapy: Secondary | ICD-10-CM | POA: Diagnosis not present

## 2022-09-18 ENCOUNTER — Encounter (HOSPITAL_COMMUNITY): Payer: Self-pay

## 2022-09-18 ENCOUNTER — Emergency Department (HOSPITAL_COMMUNITY)
Admission: EM | Admit: 2022-09-18 | Discharge: 2022-09-18 | Disposition: A | Discharge status: Home / Self Care | Payer: 59 | Attending: Emergency Medicine | Admitting: Emergency Medicine

## 2022-09-18 ENCOUNTER — Other Ambulatory Visit: Payer: Self-pay

## 2022-09-18 DIAGNOSIS — Z79899 Other long term (current) drug therapy: Secondary | ICD-10-CM | POA: Insufficient documentation

## 2022-09-18 DIAGNOSIS — E119 Type 2 diabetes mellitus without complications: Secondary | ICD-10-CM | POA: Diagnosis not present

## 2022-09-18 DIAGNOSIS — I10 Essential (primary) hypertension: Secondary | ICD-10-CM | POA: Diagnosis not present

## 2022-09-18 DIAGNOSIS — Z760 Encounter for issue of repeat prescription: Secondary | ICD-10-CM | POA: Diagnosis not present

## 2022-09-18 DIAGNOSIS — Z7984 Long term (current) use of oral hypoglycemic drugs: Secondary | ICD-10-CM | POA: Diagnosis not present

## 2022-09-18 DIAGNOSIS — R451 Restlessness and agitation: Secondary | ICD-10-CM | POA: Insufficient documentation

## 2022-09-18 NOTE — ED Triage Notes (Signed)
Says while sleeping in waiting room not as a patient someone stole her suboxone.

## 2022-09-18 NOTE — Discharge Instructions (Signed)
Call [facility] when they open in the morning to inquire about a refill of Suboxone.  You need to file a police report regarding your medications being stolen.  You can follow-up with behavioral health urgent care for any acute mental health complaints.  Continue your other prescribed medicines.  Return for new or concerning symptoms.
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
## 2022-09-20 ENCOUNTER — Telehealth: Payer: Self-pay

## 2022-09-20 NOTE — Telephone Encounter (Signed)
        Patient  visited [place] on 1/29    Telephone encounter attempt :   1st   A HIPAA compliant voice message was left requesting a return call.  Instructed patient to call back     Jilliane Kazanjian Pop [facility] Care Guide, [place] [phone] [address], [place], [place] [address] Phone: [phone] Email: [email or web address]       

## 2022-09-21 ENCOUNTER — Emergency Department (HOSPITAL_COMMUNITY)
Admission: EM | Admit: 2022-09-21 | Discharge: 2022-09-21 | Disposition: A | Discharge status: Home / Self Care | Payer: 59 | Attending: Emergency Medicine | Admitting: Emergency Medicine

## 2022-09-21 DIAGNOSIS — I10 Essential (primary) hypertension: Secondary | ICD-10-CM | POA: Insufficient documentation

## 2022-09-21 DIAGNOSIS — F1990 Other psychoactive substance use, unspecified, uncomplicated: Secondary | ICD-10-CM | POA: Diagnosis present

## 2022-09-21 DIAGNOSIS — Z79899 Other long term (current) drug therapy: Secondary | ICD-10-CM | POA: Insufficient documentation

## 2022-09-21 DIAGNOSIS — F1994 Other psychoactive substance use, unspecified with psychoactive substance-induced mood disorder: Secondary | ICD-10-CM | POA: Diagnosis present

## 2022-09-21 DIAGNOSIS — F1910 Other psychoactive substance abuse, uncomplicated: Secondary | ICD-10-CM | POA: Insufficient documentation

## 2022-09-21 LAB — CBC WITH DIFFERENTIAL/PLATELET
Abs Immature Granulocytes: 0.02 10*3/uL (ref 0.00–0.07)
Basophils Absolute: 0.1 10*3/uL (ref 0.0–0.1)
Basophils Relative: 1 %
Eosinophils Absolute: 0.4 10*3/uL (ref 0.0–0.5)
Eosinophils Relative: 5 %
HCT: 42.7 % (ref 36.0–46.0)
Hemoglobin: 13.8 g/dL (ref 12.0–15.0)
Immature Granulocytes: 0 %
Lymphocytes Relative: 33 %
Lymphs Abs: 2.8 10*3/uL (ref 0.7–4.0)
MCH: 30.3 pg (ref 26.0–34.0)
MCHC: 32.3 g/dL (ref 30.0–36.0)
MCV: 93.8 fL (ref 80.0–100.0)
Monocytes Absolute: 0.6 10*3/uL (ref 0.1–1.0)
Monocytes Relative: 7 %
Neutro Abs: 4.7 10*3/uL (ref 1.7–7.7)
Neutrophils Relative %: 54 %
Platelets: 211 10*3/uL (ref 150–400)
RBC: 4.55 MIL/uL (ref 3.87–5.11)
RDW: 12.3 % (ref 11.5–15.5)
WBC: 8.6 10*3/uL (ref 4.0–10.5)
nRBC: 0 % (ref 0.0–0.2)

## 2022-09-21 LAB — COMPREHENSIVE METABOLIC PANEL
ALT: 19 U/L (ref 0–44)
AST: 22 U/L (ref 15–41)
Albumin: 4.2 g/dL (ref 3.5–5.0)
Alkaline Phosphatase: 107 U/L (ref 38–126)
Anion gap: 9 (ref 5–15)
BUN: 24 mg/dL — ABNORMAL HIGH (ref 6–20)
CO2: 27 mmol/L (ref 22–32)
Calcium: 9.1 mg/dL (ref 8.9–10.3)
Chloride: 106 mmol/L (ref 98–111)
Creatinine, Ser: 0.89 mg/dL (ref 0.44–1.00)
GFR, Estimated: >60 mL/min (ref >60)
Glucose, Bld: 118 mg/dL — ABNORMAL HIGH (ref 70–99)
Potassium: 3.3 mmol/L — ABNORMAL LOW (ref 3.5–5.1)
Sodium: 142 mmol/L (ref 135–145)
Total Bilirubin: 0.4 mg/dL (ref 0.3–1.2)
Total Protein: 7.7 g/dL (ref 6.5–8.1)

## 2022-09-21 LAB — SALICYLATE LEVEL: Salicylate Lvl: <7 mg/dL — ABNORMAL LOW (ref 7.0–30.0)

## 2022-09-21 LAB — URINALYSIS, ROUTINE W REFLEX MICROSCOPIC
Bacteria, UA: NONE SEEN
Bilirubin Urine: NEGATIVE
Glucose, UA: >=500 mg/dL — AB
Hgb urine dipstick: NEGATIVE
Ketones, ur: NEGATIVE mg/dL
Nitrite: NEGATIVE
Protein, ur: NEGATIVE mg/dL
Specific Gravity, Urine: 1.027 (ref 1.005–1.030)
pH: 5 (ref 5.0–8.0)

## 2022-09-21 LAB — CBG MONITORING, ED: Glucose-Capillary: 128 mg/dL — ABNORMAL HIGH (ref 70–99)

## 2022-09-21 LAB — ACETAMINOPHEN LEVEL: Acetaminophen (Tylenol), Serum: <10 ug/mL — ABNORMAL LOW (ref 10–30)

## 2022-09-21 LAB — RAPID URINE DRUG SCREEN, HOSP PERFORMED
Amphetamines: POSITIVE — AB
Barbiturates: NOT DETECTED
Benzodiazepines: NOT DETECTED
Cocaine: POSITIVE — AB
Opiates: NOT DETECTED
Tetrahydrocannabinol: POSITIVE — AB

## 2022-09-21 LAB — ETHANOL: Alcohol, Ethyl (B): <10 mg/dL (ref <10)

## 2022-09-21 MED ORDER — GABAPENTIN 100 MG PO CAPS
200.0000 mg | ORAL_CAPSULE | Freq: Once | ORAL | Status: AC
  Administered 2022-09-21: 200 mg via ORAL
  Filled 2022-09-21: qty 2

## 2022-09-21 MED ORDER — ALUM & MAG HYDROXIDE-SIMETH 200-200-20 MG/5ML PO SUSP: 30.0000 mL | Freq: Four times a day (QID) | ORAL | Status: DC | PRN

## 2022-09-21 MED ORDER — SODIUM CHLORIDE 0.9 % IV BOLUS
1000.0000 mL | Freq: Once | INTRAVENOUS | Status: AC
  Administered 2022-09-21: 1000 mL via INTRAVENOUS

## 2022-09-21 MED ORDER — CLONIDINE HCL 0.1 MG PO TABS
0.1000 mg | ORAL_TABLET | Freq: Once | ORAL | Status: AC
  Administered 2022-09-21: 0.1 mg via ORAL
  Filled 2022-09-21: qty 1

## 2022-09-21 MED ORDER — NICOTINE 21 MG/24HR TD PT24: 21.0000 mg | MEDICATED_PATCH | Freq: Every day | TRANSDERMAL | Status: DC

## 2022-09-21 MED ORDER — SODIUM CHLORIDE 0.9 % IV SOLN: INTRAVENOUS | Status: DC

## 2022-09-21 NOTE — Discharge Instructions (Signed)
Follow-up as instructed by behavioral health 

## 2022-09-21 NOTE — ED Notes (Signed)
Pt brought clothing, bags, and a box with unknown items inside they have been bagged and
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
## 2022-09-21 NOTE — ED Triage Notes (Signed)
Pt coming from [facility] via EMS. Pt called in for possible OD d/t lethargy. Pt IVC'd d/t unwillingness to come voluntarily. Patient verbalizing frustration d/t IVC and it being her birthday. Patient states she is tired because she has not slept x3 days. Hx of substance abuse. Pt currently A&Ox4. No lethargy noted.

## 2022-09-21 NOTE — ED Notes (Signed)
Pt brought clothing, bags, and a box with unknown items inside they have been bagged and  labeled

## 2022-09-21 NOTE — ED Notes (Signed)
Patient states is not wanting any blood drawn she "just wants to see the psychiatrist."

## 2022-09-22 ENCOUNTER — Other Ambulatory Visit (HOSPITAL_COMMUNITY): Payer: Self-pay

## 2022-09-26 ENCOUNTER — Telehealth: Payer: Self-pay

## 2022-09-26 NOTE — Telephone Encounter (Signed)
        Patient  visited [place] on 1/29     Telephone encounter attempt :  2nd  A HIPAA compliant voice message was left requesting a return call.  Instructed patient to call back.   Uma Jerde Pop Health Care Guide, [place] [phone] [address], [place], [place] [address] Phone: [phone] Email: [email or web address]       

## 2022-09-28 ENCOUNTER — Other Ambulatory Visit (HOSPITAL_COMMUNITY): Payer: Self-pay

## 2022-09-28 ENCOUNTER — Telehealth: Payer: Self-pay

## 2022-09-28 NOTE — Telephone Encounter (Signed)
        Patient  visited [place] on 2/1     Telephone encounter attempt :  1st  A HIPAA compliant voice message was left requesting a return call.  Instructed patient to call back    [place] [phone] [address], [place], [place] [address] Phone: [phone] Email: [email or web address]

## 2022-09-29 ENCOUNTER — Emergency Department (HOSPITAL_COMMUNITY)
Admission: EM | Admit: 2022-09-29 | Discharge: 2022-09-30 | Disposition: A | Discharge status: Home / Self Care | Payer: 59 | Attending: Emergency Medicine | Admitting: Emergency Medicine

## 2022-09-29 ENCOUNTER — Telehealth: Payer: Self-pay

## 2022-09-29 ENCOUNTER — Other Ambulatory Visit (HOSPITAL_COMMUNITY): Payer: Self-pay

## 2022-09-29 ENCOUNTER — Encounter (HOSPITAL_COMMUNITY): Payer: Self-pay

## 2022-09-29 ENCOUNTER — Other Ambulatory Visit: Payer: Self-pay

## 2022-09-29 DIAGNOSIS — F1310 Sedative, hypnotic or anxiolytic abuse, uncomplicated: Secondary | ICD-10-CM | POA: Insufficient documentation

## 2022-09-29 DIAGNOSIS — Z59819 Housing instability, housed unspecified: Secondary | ICD-10-CM

## 2022-09-29 DIAGNOSIS — R5383 Other fatigue: Secondary | ICD-10-CM | POA: Diagnosis present

## 2022-09-29 DIAGNOSIS — F6089 Other specific personality disorders: Secondary | ICD-10-CM | POA: Diagnosis not present

## 2022-09-29 DIAGNOSIS — Z79899 Other long term (current) drug therapy: Secondary | ICD-10-CM | POA: Insufficient documentation

## 2022-09-29 DIAGNOSIS — E119 Type 2 diabetes mellitus without complications: Secondary | ICD-10-CM | POA: Insufficient documentation

## 2022-09-29 DIAGNOSIS — F1994 Other psychoactive substance use, unspecified with psychoactive substance-induced mood disorder: Secondary | ICD-10-CM

## 2022-09-29 DIAGNOSIS — F1410 Cocaine abuse, uncomplicated: Secondary | ICD-10-CM | POA: Diagnosis not present

## 2022-09-29 DIAGNOSIS — F1910 Other psychoactive substance abuse, uncomplicated: Secondary | ICD-10-CM

## 2022-09-29 DIAGNOSIS — Z7984 Long term (current) use of oral hypoglycemic drugs: Secondary | ICD-10-CM | POA: Diagnosis not present

## 2022-09-29 DIAGNOSIS — R400 Somnolence: Secondary | ICD-10-CM | POA: Diagnosis not present

## 2022-09-29 DIAGNOSIS — F1990 Other psychoactive substance use, unspecified, uncomplicated: Secondary | ICD-10-CM | POA: Diagnosis present

## 2022-09-29 DIAGNOSIS — I10 Essential (primary) hypertension: Secondary | ICD-10-CM | POA: Diagnosis not present

## 2022-09-29 DIAGNOSIS — J45909 Unspecified asthma, uncomplicated: Secondary | ICD-10-CM | POA: Insufficient documentation

## 2022-09-29 DIAGNOSIS — F1914 Other psychoactive substance abuse with psychoactive substance-induced mood disorder: Secondary | ICD-10-CM | POA: Insufficient documentation

## 2022-09-29 DIAGNOSIS — F1510 Other stimulant abuse, uncomplicated: Secondary | ICD-10-CM

## 2022-09-29 DIAGNOSIS — G479 Sleep disorder, unspecified: Secondary | ICD-10-CM

## 2022-09-29 DIAGNOSIS — F17210 Nicotine dependence, cigarettes, uncomplicated: Secondary | ICD-10-CM | POA: Insufficient documentation

## 2022-09-29 DIAGNOSIS — F1210 Cannabis abuse, uncomplicated: Secondary | ICD-10-CM | POA: Diagnosis not present

## 2022-09-29 LAB — CBC WITH DIFFERENTIAL/PLATELET
Abs Immature Granulocytes: 0.02 10*3/uL (ref 0.00–0.07)
Basophils Absolute: 0.1 10*3/uL (ref 0.0–0.1)
Basophils Relative: 1 %
Eosinophils Absolute: 0.4 10*3/uL (ref 0.0–0.5)
Eosinophils Relative: 6 %
HCT: 41.5 % (ref 36.0–46.0)
Hemoglobin: 13.2 g/dL (ref 12.0–15.0)
Immature Granulocytes: 0 %
Lymphocytes Relative: 43 %
Lymphs Abs: 3.1 10*3/uL (ref 0.7–4.0)
MCH: 29.8 pg (ref 26.0–34.0)
MCHC: 31.8 g/dL (ref 30.0–36.0)
MCV: 93.7 fL (ref 80.0–100.0)
Monocytes Absolute: 0.5 10*3/uL (ref 0.1–1.0)
Monocytes Relative: 7 %
Neutro Abs: 3.1 10*3/uL (ref 1.7–7.7)
Neutrophils Relative %: 43 %
Platelets: 252 10*3/uL (ref 150–400)
RBC: 4.43 MIL/uL (ref 3.87–5.11)
RDW: 12.3 % (ref 11.5–15.5)
WBC: 7.3 10*3/uL (ref 4.0–10.5)
nRBC: 0 % (ref 0.0–0.2)

## 2022-09-29 LAB — COMPREHENSIVE METABOLIC PANEL
ALT: 19 U/L (ref 0–44)
AST: 23 U/L (ref 15–41)
Albumin: 3.2 g/dL — ABNORMAL LOW (ref 3.5–5.0)
Alkaline Phosphatase: 95 U/L (ref 38–126)
Anion gap: 9 (ref 5–15)
BUN: 12 mg/dL (ref 6–20)
CO2: 25 mmol/L (ref 22–32)
Calcium: 9.2 mg/dL (ref 8.9–10.3)
Chloride: 108 mmol/L (ref 98–111)
Creatinine, Ser: 0.68 mg/dL (ref 0.44–1.00)
GFR, Estimated: >60 mL/min (ref >60)
Glucose, Bld: 96 mg/dL (ref 70–99)
Potassium: 3.2 mmol/L — ABNORMAL LOW (ref 3.5–5.1)
Sodium: 142 mmol/L (ref 135–145)
Total Bilirubin: 0.6 mg/dL (ref 0.3–1.2)
Total Protein: 6.6 g/dL (ref 6.5–8.1)

## 2022-09-29 LAB — I-STAT BETA HCG BLOOD, ED (MC, WL, AP ONLY): I-stat hCG, quantitative: <5 m[IU]/mL (ref <5)

## 2022-09-29 LAB — ETHANOL: Alcohol, Ethyl (B): <10 mg/dL (ref <10)

## 2022-09-29 LAB — SALICYLATE LEVEL: Salicylate Lvl: <7 mg/dL — ABNORMAL LOW (ref 7.0–30.0)

## 2022-09-29 LAB — ACETAMINOPHEN LEVEL: Acetaminophen (Tylenol), Serum: <10 ug/mL — ABNORMAL LOW (ref 10–30)

## 2022-09-29 MED ORDER — HYDROXYZINE HCL 25 MG PO TABS: 25.0000 mg | ORAL_TABLET | Freq: Once | ORAL | Status: DC

## 2022-09-29 NOTE — ED Notes (Signed)
Pt currently sleeping in bed with head uncovered. No signs of distress at this time.

## 2022-09-29 NOTE — ED Provider Triage Note (Signed)
Emergency Medicine Provider Triage Evaluation Note  ERCIE PLANK , a 47 y.o. female  was evaluated in triage.  Pt complains of insomnia.  Patient reports that she currently stays at the [facility].  She is complaining of difficulty sleeping there are refill that she is unable to function due to her poor quality of sleep last 3 to 4 days.  She reports that this is causing Druscilla Brownie been "close to the edge" and has had thoughts of suicide, but denies any active plans or significant inclination of the act on these thoughts.  She reports previous history of depression, bipolar disorder, ADHD.  Not currently taking any medications for these conditions.  Review of Systems  Positive: As above Negative: As above  Physical Exam  BP 131/84 (BP Location: Right Arm)   Pulse 70   Temp 98.2 F (36.8 C) (Oral)   Resp 16   Ht 5' 2"$  (1.575 m)   Wt 72.6 kg   LMP 09/14/2019 (Approximate)   SpO2 91%   BMI 29.26 kg/m  Gen:   Awake, no distress   Resp:  Normal effort  MSK:   Moves extremities without difficulty Other:    Medical Decision Making  Medically screening exam initiated at 4:23 PM.  Appropriate orders placed.  Chalmers Cater was informed that the remainder of the evaluation will be completed by another provider, this initial triage assessment does not replace that evaluation, and the importance of remaining in the ED until their evaluation is complete.     Luvenia Heller, PA-C 09/29/22 1624
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
## 2022-09-29 NOTE — ED Provider Notes (Signed)
[facility] Provider Note   CSN: [id number] Arrival date & time: 09/29/22  1556     History  Chief Complaint  Patient presents with   Insomnia    Leah Barber is a 47 y.o. female.   Insomnia  Patient is a 47 year old female with past medical history significant for substance-induced mood disorder.  She presents emergency room today under IVC from [facility] where she stays as she is a homeless patient.  She has been feeling more fatigued and has been complaining of being lethargic.  She seems to be using multiple substances including opiates, methamphetamine, cocaine.  She denies any substance use to me however these were found to be positive on urine drug screen within the past week.  She denies any pain, no nausea or vomiting.  She states that she was feeling suicidal earlier and told this to [facility] staff who placed her under IVC.  She denies any suicidal or homicidal thoughts currently.  Denies any hallucinations.     Home Medications Prior to Admission medications   Medication Sig Start Date End Date Taking? Authorizing Provider  Albuterol Sulfate (PROAIR RESPICLICK) [id number] (90 Base) MCG/ACT AEPB Inhale 2 puffs into the lungs 4 (four) times daily as needed. 05/17/20 05/17/21  [name], NP  amLODipine (NORVASC) 10 MG tablet Take 1 tablet (10 mg total) by mouth daily. 09/05/22     Buprenorphine HCl-Naloxone HCl 8-2 MG FILM Place 1 Film under the tongue 3 (three) times daily. 09/05/22     buprenorphine-naloxone (SUBOXONE) 8-2 mg SUBL SL tablet Place 1 tablet under the tongue 3 times daily as directed 02/16/22     buPROPion (WELLBUTRIN SR) 150 MG 12 hr tablet Take 1 tablet (150 mg total) by mouth daily for 3 days then take 1 tablet 2 (two) times daily. 09/05/22     cetirizine (ZYRTEC) 10 MG tablet TAKE 1 TABLET BY MOUTH EVERY DAY 04/11/21   [name], NP  cloNIDine (CATAPRES) 0.1 MG tablet Take 1 tablet (0.1 mg total) by mouth 3  (three) times daily. [id number]     CONCERTA 18 MG CR tablet Take 18 mg by mouth daily as needed. 05/17/20   [provider]  dapagliflozin propanediol (FARXIGA) 10 MG TABS tablet Take 1 tablet (10 mg total) by mouth daily. 02/04/22     dapagliflozin propanediol (FARXIGA) 10 MG TABS tablet Take 1 tablet by mouth daily 03/05/22     dapagliflozin propanediol (FARXIGA) 10 MG TABS tablet Take 1 tablet (10 mg total) by mouth daily. 05/28/22     dapagliflozin propanediol (FARXIGA) 10 MG TABS tablet Take 1 tablet (10 mg total) by mouth daily. 09/05/22     dapagliflozin propanediol (FARXIGA) 5 MG TABS tablet Take 1 tablet by mouth daily 01/05/22     diclofenac (VOLTAREN) 50 MG EC tablet Take 1 tablet by mouth 3 times a day for pain 01/26/22     Diclofenac Sodium CR 100 MG 24 hr tablet Take 1 tablet by mouth daily 04/13/22     doxycycline (VIBRAMYCIN) 100 MG capsule Take 1 capsule (100 mg total) by mouth 2 (two) times daily. 08/28/22   [name], PA-C  DULoxetine (CYMBALTA) 30 MG capsule Take 1 capsule by mouth 2 times a day 01/05/22     DULoxetine (CYMBALTA) 30 MG capsule Take 1 capsule by mouth 2 times daily. 02/27/22     DULoxetine (CYMBALTA) 30 MG capsule Take 1 capsule by mouth 2 times  daily. 02/27/22     DULoxetine (CYMBALTA) 60 MG capsule Take 1 capsule (60 mg total) by mouth 2 (two) times daily. 05/28/22     fluticasone (FLONASE) 50 MCG/ACT nasal spray Place 2 sprays into both nostrils daily. 07/25/21   [name], NP  fluticasone Asencion Islam) 50 MCG/ACT nasal spray Use 1 spray in each nostril daily at bedtime 03/05/22     fluticasone (FLONASE) 50 MCG/ACT nasal spray Spray one spray into each nostril at bedtime. 04/04/22     fluticasone (FLONASE) 50 MCG/ACT nasal spray Place 1 spray daily at bedtime in each nostril 04/12/22     fluticasone (FLONASE) 50 MCG/ACT nasal spray Place 1 spray in each nostril daily at bedtime 06/15/22     fluticasone (FLONASE) 50 MCG/ACT nasal spray Place 1 spray into both  nostrils at bedtime. 09/05/22     furosemide (LASIX) 20 MG tablet Take 1 tablet (20 mg total) by mouth daily. 09/05/22     gabapentin (NEURONTIN) 800 MG tablet TAKE 1 TABLET BY MOUTH EVERY 8 HOURS 01/09/22   Fenton Foy, NP  gabapentin (NEURONTIN) 800 MG tablet Take 1 tablet (800 mg total) by mouth 3 (three) times daily. 09/05/22     glipiZIDE (GLUCOTROL) 5 MG tablet Take 1 tablet (5 mg total) by mouth daily. 09/05/22     hydrOXYzine (VISTARIL) 50 MG capsule Take 1 capsule (50 mg total) by mouth 4 (four) times daily as needed for anxiety or nausea. 09/08/21   [name], PA-C  ibuprofen (ADVIL) 800 MG tablet Take 1 tablet (800 mg total) by mouth 3 (three) times daily with meals as needed. 05/28/22     latanoprost (XALATAN) 0.005 % ophthalmic solution Instill 1 drop in eyes daily as directed 01/11/22     latanoprost (XALATAN) 0.005 % ophthalmic solution Place 1 drop in both eyes daily 02/04/22     latanoprost (XALATAN) 0.005 % ophthalmic solution Instill 1 Drop in eye(s) daily 03/05/22     latanoprost (XALATAN) 0.005 % ophthalmic solution Place 1 drop in both eyes daily. 06/15/22     latanoprost (XALATAN) 0.005 % ophthalmic solution Place 1 drop into both eyes daily. 09/05/22     mirtazapine (REMERON) 15 MG tablet Take 1 tablet by mouth at bedtime for major depressive disorder 02/16/22     Multiple Vitamins-Minerals (WOMENS MULTIVITAMIN PO) Take 1 tablet by mouth daily.     [provider]  omeprazole (PRILOSEC) 20 MG capsule Take 1 capsule (20 mg total) by mouth daily. 02/04/22     omeprazole (PRILOSEC) 20 MG capsule Take 1 capsule by mouth daily. 02/27/22     omeprazole (PRILOSEC) 20 MG capsule Take 1 capsule by mouth daily. 02/27/22     omeprazole (PRILOSEC) 20 MG capsule Take 1 capsule by mouth daily 03/05/22     omeprazole (PRILOSEC) 20 MG capsule Take 1 capsule (20 mg total) by mouth daily. 05/02/22     omeprazole (PRILOSEC) 40 MG capsule Take 1 capsule (40 mg total) by mouth daily. 07/25/21  01/21/22  [name], NP  omeprazole (PRILOSEC) 40 MG capsule Take 1 capsule (40 mg total) by mouth daily. 09/05/22     rosuvastatin (CRESTOR) 5 MG tablet Take 1 tablet (5 mg total) by mouth daily. 07/25/21 01/21/22  [name], NP  rosuvastatin (CRESTOR) 5 MG tablet Take 1 tablet by mouth daily. 05/08/22     rosuvastatin (CRESTOR) 5 MG tablet Take 1 tablet (5 mg total) by mouth daily. 05/28/22  rosuvastatin (CRESTOR) 5 MG tablet Take 1 tablet (5 mg total) by mouth daily. 06/15/22     rosuvastatin (CRESTOR) 5 MG tablet Take 1 tablet (5 mg total) by mouth daily. 09/05/22         Allergies    Clindamycin/lincomycin, Other, Hydrocodone, and Morphine    Review of Systems   Review of Systems  Psychiatric/Behavioral:  The patient has insomnia.     Physical Exam Updated Vital Signs BP (!) 161/93   Pulse 99   Temp 98.6 F (37 C)   Resp 18   Ht 5' 2"$  (1.575 m)   Wt 72.6 kg   LMP 09/14/2019 (Approximate)   SpO2 94%   BMI 29.26 kg/m  Physical Exam Vitals and nursing note reviewed.  Constitutional:      General: She is not in acute distress.    Comments: Somnolent but arousable with sternal rub.  Alert and oriented x 3, able answer questions appropriate follow commands  HENT:     Head: Normocephalic and atraumatic.     Nose: Nose normal.  Eyes:     General: No scleral icterus. Cardiovascular:     Rate and Rhythm: Normal rate and regular rhythm.     Pulses: Normal pulses.     Heart sounds: Normal heart sounds.  Pulmonary:     Effort: Pulmonary effort is normal. No respiratory distress.     Breath sounds: No wheezing.  Abdominal:     Palpations: Abdomen is soft.     Tenderness: There is no abdominal tenderness.  Musculoskeletal:     Cervical back: Normal range of motion.     Right lower leg: No edema.     Left lower leg: No edema.  Skin:    General: Skin is warm and dry.     Capillary Refill: Capillary refill takes less than 2 seconds.  Neurological:     Mental Status:  She is alert. Mental status is at baseline.  Psychiatric:        Mood and Affect: Mood normal.        Behavior: Behavior normal.     ED Results / Procedures / Treatments   Labs (all labs ordered are listed, but only abnormal results are displayed) Labs Reviewed  COMPREHENSIVE METABOLIC PANEL - Abnormal; Notable for the following components:      Result Value   Potassium 3.2 (*)    Albumin 3.2 (*)    All other components within normal limits  SALICYLATE LEVEL - Abnormal; Notable for the following components:   Salicylate Lvl [id number] (*)    All other components within normal limits  ACETAMINOPHEN LEVEL - Abnormal; Notable for the following components:   Acetaminophen (Tylenol), Serum <10 (*)    All other components within normal limits  ETHANOL  CBC WITH DIFFERENTIAL/PLATELET  RAPID URINE DRUG SCREEN, HOSP PERFORMED  I-STAT BETA HCG BLOOD, ED ([facility], [facility], [facility] ONLY)    EKG None  Radiology No results found.  Procedures Procedures    Medications Ordered in ED Medications - No data to display  ED Course/ Medical Decision Making/ A&P                             Medical Decision Making Amount and/or Complexity of Data Reviewed Labs: ordered.   This patient presents to the ED for concern of suicidal thoughts under IVC, this involves a number of treatment options, and is a complaint that carries with it a  moderate to high risk of complications and morbidity.  Co morbidities: Discussed in HPI   Brief History:  Patient is a 47 year old female with past medical history significant for substance-induced mood disorder.  She presents emergency room today under IVC from [facility] where she stays as she is a homeless patient.  She has been feeling more fatigued and has been complaining of being lethargic.  She seems to be using multiple substances including opiates, methamphetamine, cocaine.  She denies any substance use to me however these were found to be positive on urine drug  screen within the past week.  She denies any pain, no nausea or vomiting.  She states that she was feeling suicidal earlier and told this to [facility] staff who placed her under IVC.  She denies any suicidal or homicidal thoughts currently.  Denies any hallucinations.    EMR reviewed including pt PMHx, past surgical history and past visits to ER.   See HPI for more details   Lab Tests:   I personally reviewed all laboratory work and imaging. Metabolic panel without any acute abnormality specifically kidney function within normal limits and no significant electrolyte abnormalities. CBC without leukocytosis or significant anemia.   Imaging Studies:  No imaging studies ordered for this patient    Cardiac Monitoring:  NA EKG non-ischemic   Medicines ordered:     Critical Interventions:     Consults/Attending Physician      Reevaluation:  After the interventions noted above I re-evaluated patient and found that they have :stayed the same   Social Determinants of Health:      Problem List / ED Course:  Suicidal earlier today was placed to IVC by [facility].  Patient very similar presentation 2/1.  I suspect that her symptoms are again induced by substance use.   Dispostion:  After consideration of the diagnostic results and the patients response to treatment, I feel that the patent would benefit from psychiatric evaluation given the patient is under IVC.  Patient is medically cleared at this time and awaiting TTS consultation.   Final Clinical Impression(s) / ED Diagnoses Final diagnoses:  Polysubstance abuse [facility])    Rx / DC Orders ED Discharge Orders     None         Tedd Sias, [place] 09/29/22 2327    Tretha Sciara, MD 10/04/22 [phone]

## 2022-09-29 NOTE — ED Notes (Signed)
All pt belongings placed in purple zone utility room under sink.  Multiple bags, suit case, belongings bags, boxes, clothing

## 2022-09-29 NOTE — Telephone Encounter (Signed)
        Patient  visited [facility] on 2/1    Telephone encounter attempt :  2nd  A HIPAA compliant voice message was left requesting a return call.  Instructed patient to call back   [facility] [phone] [address], [place], [place] [address] Phone: [phone] Email: [email or web address]

## 2022-09-30 DIAGNOSIS — F1994 Other psychoactive substance use, unspecified with psychoactive substance-induced mood disorder: Secondary | ICD-10-CM

## 2022-09-30 LAB — RAPID URINE DRUG SCREEN, HOSP PERFORMED
Amphetamines: NOT DETECTED
Barbiturates: NOT DETECTED
Benzodiazepines: NOT DETECTED
Cocaine: POSITIVE — AB
Opiates: NOT DETECTED
Tetrahydrocannabinol: POSITIVE — AB

## 2022-09-30 MED ORDER — DULOXETINE HCL 30 MG PO CPEP
60.0000 mg | ORAL_CAPSULE | Freq: Two times a day (BID) | ORAL | Status: DC
  Administered 2022-09-30: 60 mg via ORAL
  Filled 2022-09-30: qty 2

## 2022-09-30 MED ORDER — LATANOPROST 0.005 % OP SOLN
1.0000 [drp] | Freq: Every day | OPHTHALMIC | Status: DC
  Filled 2022-09-30: qty 2.5

## 2022-09-30 MED ORDER — DAPAGLIFLOZIN PROPANEDIOL 10 MG PO TABS
10.0000 mg | ORAL_TABLET | Freq: Every day | ORAL | Status: DC
  Filled 2022-09-30: qty 1

## 2022-09-30 MED ORDER — FUROSEMIDE 20 MG PO TABS
20.0000 mg | ORAL_TABLET | Freq: Every day | ORAL | Status: DC
  Administered 2022-09-30: 20 mg via ORAL
  Filled 2022-09-30: qty 1

## 2022-09-30 MED ORDER — CLONIDINE HCL 0.1 MG PO TABS
0.1000 mg | ORAL_TABLET | Freq: Three times a day (TID) | ORAL | Status: DC
  Administered 2022-09-30: 0.1 mg via ORAL
  Filled 2022-09-30: qty 1

## 2022-09-30 MED ORDER — AMLODIPINE BESYLATE 5 MG PO TABS
10.0000 mg | ORAL_TABLET | Freq: Every day | ORAL | Status: DC
  Administered 2022-09-30: 10 mg via ORAL
  Filled 2022-09-30: qty 2

## 2022-09-30 MED ORDER — IBUPROFEN 800 MG PO TABS
800.0000 mg | ORAL_TABLET | Freq: Once | ORAL | Status: AC
  Administered 2022-09-30: 800 mg via ORAL
  Filled 2022-09-30: qty 1

## 2022-09-30 MED ORDER — GLIPIZIDE 5 MG PO TABS
5.0000 mg | ORAL_TABLET | Freq: Every day | ORAL | Status: DC
  Filled 2022-09-30: qty 1

## 2022-09-30 MED ORDER — HYDROXYZINE HCL 25 MG PO TABS: 50.0000 mg | ORAL_TABLET | Freq: Four times a day (QID) | ORAL | Status: DC | PRN

## 2022-09-30 MED ORDER — ROSUVASTATIN CALCIUM 5 MG PO TABS
5.0000 mg | ORAL_TABLET | Freq: Every day | ORAL | Status: DC
  Administered 2022-09-30: 5 mg via ORAL
  Filled 2022-09-30: qty 1

## 2022-09-30 MED ORDER — FLUTICASONE PROPIONATE 50 MCG/ACT NA SUSP
1.0000 | Freq: Every day | NASAL | Status: DC
  Filled 2022-09-30: qty 16

## 2022-09-30 MED ORDER — GABAPENTIN 400 MG PO CAPS
800.0000 mg | ORAL_CAPSULE | Freq: Three times a day (TID) | ORAL | Status: DC
  Administered 2022-09-30: 800 mg via ORAL
  Filled 2022-09-30: qty 2

## 2022-09-30 MED ORDER — BUPRENORPHINE HCL-NALOXONE HCL 8-2 MG SL SUBL
1.0000 | SUBLINGUAL_TABLET | Freq: Three times a day (TID) | SUBLINGUAL | Status: DC
  Administered 2022-09-30: 1 via SUBLINGUAL
  Filled 2022-09-30: qty 1
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
## 2022-09-30 NOTE — ED Provider Notes (Signed)
Emergency Medicine Observation Re-evaluation Note  Leah Barber is a 47 y.o. female, seen on rounds today.  Pt initially presented to the ED for complaints of Insomnia Currently, the patient is resting comfortably.  Physical Exam  BP (!) 155/85   Pulse 74   Temp 98.1 F (36.7 C) (Oral)   Resp 18   Ht 5' 2"$  (1.575 m)   Wt 72.6 kg   LMP 09/14/2019 (Approximate)   SpO2 92%   BMI 29.26 kg/m  Physical Exam General: No acute distress Cardiac: Regular rate Lungs: No respiratory distress Psych: Calm  ED Course / MDM  EKG:EKG Interpretation  Date/Time:  Friday September 29 2022 20:21:57 EST Ventricular Rate:  50 PR Interval:  144 QRS Duration: 92 QT Interval:  455 QTC Calculation: 415 R Axis:   9 Text Interpretation: Sinus rhythm Borderline T abnormalities, anterior leads When compared with ECG of 09/21/2022, HEART RATE has decreased Confirmed by Delora Fuel ([id number]) on 09/30/2022 12:13:07 AM  I have reviewed the labs performed to date as well as medications administered while in observation.  Recent changes in the last 24 hours include -no new changes.  Plan  Current plan is for me to order patient's home meds.  Patient is slightly upset, she has not received Suboxone.  I have ordered Suboxone along with her home meds.  Nurse made me aware that patient was agitated with her earlier today as well.  Currently calm, and I think her home meds will help.    Varney Biles, MD 09/30/22 [phone]

## 2022-09-30 NOTE — ED Notes (Signed)
TTS speaking with pt.  

## 2022-09-30 NOTE — Consult Note (Signed)
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 this encounter. Name: Demetrica Barret Role: [place]   Name: Merlyn Lot Role: [place]  Name: Hampton Abbot Role: Psychiatrist    Mallie Darting, NP 09/30/2022 5:19 PM

## 2022-09-30 NOTE — BH Assessment (Signed)
Comprehensive Clinical Assessment (CCA) Note  09/30/2022 Leah Barber [id number]  DISPOSITION: Gave clinical report to Leandro Reasoner, NP who recommended Pt be observed and evaluated by psychiatry later today. Notified Dr. Quintella Reichert and Cresenciano Lick. Zenia Resides, RN of recommendation via secure message.  The patient demonstrates the following risk factors for suicide: Chronic risk factors for suicide include: substance use disorder. Acute risk factors for suicide include: social withdrawal/isolation. Protective factors for this patient include:  unknown . Considering these factors, the overall suicide risk at this point appears to be high. Patient is not appropriate for outpatient follow up.  Pt is a 47 year old female who presents unaccompanied to [facility] ED from [facility] after expressing suicidal ideation. Per EDP note, Pt was petitioned for involuntary commitment after telling staff at [facility] she was having suicidal thoughts. ED staff was unable to locate Affidavit and Petition for Involuntary Commitment form and TTS was unable to determine the claims or who petitioned. Pt's medical record indicates Pt has a long history of substance use including opiates and Suboxone and also has received treatment for bipolar disorder, depression, and anxiety. Her current mental health provider is listed as Basilio Cairo at [facility].   Pt is very somnolent during assessment and gave little information before she stopped responding to questions. She says she has been unable to sleep at [facility], that she has been unable to rest and feels exhausted. She denies current suicidal ideation or history of suicide attempts. She denies thoughts of harming others or history of violence. She denies auditory or visual hallucinations. She denies substance use and her urine drug screen has not resulted, however a urine drug screen on 09/21/2022 was positive for cocaine, amphetamines, and cannabis.   Pt did not give permission to contact  anyone for collateral information.  Pt is dressed in hospital scrubs, very drowsy, and oriented x4. Pt speaks in a mumbled tone, at low volume and normal pace. Motor behavior appears normal. Eye contact is avoidant. Pt's mood is irritable and affect is congruent with mood. Thought process is coherent and relevant. There is no indication she is currently responding to internal stimuli or experiencing delusional thought content.  Chief Complaint:  Chief Complaint  Patient presents with   Insomnia   Visit Diagnosis: Substance induced mood disorder ([place])    CCA Screening, Triage and Referral (STR)  Patient Reported Information How did you hear about [place]? Legal System  What Is the Reason for Your Visit/Call Today? Per EDP note, Pt was petitioned for involuntary commitment at [facility] due to expressing suicidal ideation.  How Long Has This Been Causing You Problems? <Week  What Do You Feel Would Help You the Most Today? Medication(s)   Have You Recently Had Any Thoughts About Hurting Yourself? Yes  Are You Planning to Commit Suicide/Harm Yourself At This time? No   Flowsheet Row ED from 09/29/2022 in [facility] Emergency Department at [facility] ED from 09/18/2022 in [facility] Emergency Department at [facility] ED from 08/28/2022 in [facility] Emergency Department at [place] High Risk No Risk No Risk       Have you Recently Had Thoughts About [place]? No  Are You Planning to Harm Someone at This Time? No  Explanation: Pt expressed suicidal ideation earlier today. She denies current suicidal ideation or homicidal ideation.   Have You Used Any Alcohol or Drugs in the Past 24 Hours? No  What Did  You Use and How Much? Pt denies using substances   Do You Currently Have a Therapist/Psychiatrist? Yes  Name of Therapist/Psychiatrist: Name of Therapist/Psychiatrist: Outpatient Psychiatrist is Willa Rough at [place] Recently Discharged From Any Office Practice or Programs? No  Explanation of Discharge From Practice/Program: None     CCA Screening Triage Referral Assessment Type of Contact: Tele-Assessment  Telemedicine Service Delivery: Telemedicine service delivery: This service was provided via telemedicine using a 2-way, interactive audio and video technology  Is this Initial or Reassessment? Is this Initial or Reassessment?: Initial Assessment  Date Telepsych consult ordered in [facility]:  Date Telepsych consult ordered in [facility]: 09/29/22  Time Telepsych consult ordered in [facility]:  Time Telepsych consult ordered in [facility]: 2052  Location of Assessment: [facility] ED  Provider Location: [facility] Assessment Services   Collateral Involvement: Medical record   Does Patient Have a [place]? No  Legal Guardian Contact Information: Pt does not have a legal guardian  Copy of Legal Guardianship Form: -- (Pt does not have a legal guardian)  Legal Guardian Notified of Arrival: -- (Pt does not have a legal guardian)  Legal Guardian Notified of Pending Discharge: -- (Pt does not have a legal guardian)  If Minor and Not Living with Parent(s), Who has Custody? Pt is an adult  Is CPS involved or ever been involved? Never  Is APS involved or ever been involved? Never   Patient Determined To Be At Risk for Harm To Self or Others Based on Review of Patient Reported Information or Presenting Complaint? No  Method: No Plan  Availability of Means: No access or NA  Intent: Vague intent or NA  Notification Required: Another person is identifiable and needs to be warned to ensure safety (DUTY TO WARN)  Additional Information for Danger to Others Potential: -- (None)  Additional Comments for Danger to Others Potential: Pt denies thoughts of harming others or history of violence.  Are There Guns or Other Weapons in [place]? -- (Unable to assess due to Pt's  somnolence.)  Types of Guns/Weapons: Unable to assess due to Pt's somnolence.  Are These Weapons Safely Secured?                            -- (Unable to assess due to Pt's somnolence.)  Who Could Verify You Are Able To Have These Secured: Unable to assess due to Pt's somnolence.  Do You Have any Outstanding Charges, Pending Court Dates, Parole/Probation? Unable to assess due to Pt's somnolence.  Contacted To Inform of Risk of Harm To Self or Others: Other: Comment (none)    Does Patient Present under Involuntary Commitment? Yes (Accord to EDP note, Pt is under IVC.)    [place] of Residence: [place]   Patient Currently Receiving the Following Services: Not Receiving Services   Determination of Need: Emergent (2 hours)   Options For Referral: Inpatient Hospitalization; [facility]; Medication Management; Outpatient Therapy     CCA Biopsychosocial Patient Reported Schizophrenia/Schizoaffective Diagnosis in Past: No   Strengths: Unable to assess due to Pt's somnolence.   Mental Health Symptoms Depression:   Change in energy/activity; Fatigue; Sleep (too much or little)   Duration of Depressive symptoms:  Duration of Depressive Symptoms: Greater than two weeks   Mania:   None   Anxiety:    Tension; Worrying; Sleep; Irritability; Fatigue   Psychosis:   None   Duration  of Psychotic symptoms:    Trauma:   -- (Unable to assess due to Pt's somnolence.)   Obsessions:   None   Compulsions:   None   Inattention:   None   Hyperactivity/Impulsivity:   None   Oppositional/Defiant Behaviors:   None   Emotional Irregularity:   None   Other Mood/Personality Symptoms:   None noted    Mental Status Exam Appearance and self-care  Stature:   Average   Weight:   Average weight   Clothing:   -- (Scrubs)   Grooming:   Neglected   Cosmetic use:   None   Posture/gait:   Normal   Motor activity:   Not Remarkable   Sensorium  Attention:    Inattentive   Concentration:   -- (Unable to assess due to Pt's somnolence.)   Orientation:   X5   Recall/memory:   Normal   Affect and Mood  Affect:   Appropriate   Mood:   Irritable   Relating  Eye contact:   Avoided   Facial expression:   Responsive   Attitude toward examiner:   Uninterested   Thought and Language  Speech flow:  Paucity   Thought content:   Appropriate to Mood and Circumstances   Preoccupation:   None   Hallucinations:   None   Organization:   Coherent   [employer] of Knowledge:   Fair   Intelligence:   Average   Abstraction:   Normal   Judgement:   Fair   Art therapist:   Adequate   Insight:   Fair   Decision Making:   Normal   Social Functioning  Social Maturity:   Isolates   Social Judgement:   Normal   Stress  Stressors:   Housing; Teacher, music Ability:   Exhausted   Skill Deficits:   None   Supports:   Other (Comment) (Unable to assess due to Pt's somnolence.)     Religion: Religion/Spirituality Are You A Religious Person?:  (Unable to assess due to Pt's somnolence.) How Might This Affect Treatment?: Unable to assess due to Pt's somnolence.  Leisure/Recreation: Leisure / Recreation Do You Have Hobbies?:  (Unable to assess due to Pt's somnolence.)  Exercise/Diet: Exercise/Diet Do You Exercise?:  (Unable to assess due to Pt's somnolence.) Have You Gained or Lost A Significant Amount of Weight in the Past Six Months?:  (Unable to assess due to Pt's somnolence.) Do You Follow a Special Diet?:  (Unable to assess due to Pt's somnolence.) Do You Have Any Trouble Sleeping?: Yes Explanation of Sleeping Difficulties: Pt reports difficulty sleeping at [facility]   CCA Employment/Education Employment/Work Situation: Employment / Work Situation Employment Situation:  (Unable to assess due to Pt's somnolence.) Patient's Job has Been Impacted by Current Illness:  (Unable to assess  due to Pt's somnolence.) Has Patient ever Been in the [employer]?:  (Unable to assess due to Pt's somnolence.)  Education: Education Is Patient Currently Attending School?:  (Unable to assess due to Pt's somnolence.) Last Grade Completed:  (Unable to assess due to Pt's somnolence.) Did You Attend College?:  (Unable to assess due to Pt's somnolence.) Did You Have An Individualized Education Program (IIEP):  (Unable to assess due to Pt's somnolence.) Did You Have Any Difficulty At School?:  (Unable to assess due to Pt's somnolence.) Patient's Education Has Been Impacted by Current Illness:  (Unable to assess due to Pt's somnolence.)   CCA Family/Childhood History Family and Relationship History: Family history  Marital status:  (Unable to assess due to Pt's somnolence.) Does patient have children?:  (Unable to assess due to Pt's somnolence.)  Childhood History:  Childhood History By whom was/is the patient raised?:  (Unable to assess due to Pt's somnolence.) Did patient suffer any verbal/emotional/physical/sexual abuse as a child?:  (Unable to assess due to Pt's somnolence.) Did patient suffer from severe childhood neglect?:  (Unable to assess due to Pt's somnolence.) Has patient ever been sexually abused/assaulted/raped as an adolescent or adult?:  (Unable to assess due to Pt's somnolence.) Was the patient ever a victim of a crime or a disaster?:  (Unable to assess due to Pt's somnolence.) Witnessed domestic violence?:  (Unable to assess due to Pt's somnolence.) Has patient been affected by domestic violence as an adult?:  (Unable to assess due to Pt's somnolence.)       CCA Substance Use Alcohol/Drug Use: Alcohol / Drug Use Pain Medications: Pt prescribed Suboxone Prescriptions: Denies abuse Over the Counter: Denies abuse History of alcohol / drug use?: Yes (Pt denies substance use, however recent urine drug screen was positive for amphetamines, cocaine, cannabis.) Longest period  of sobriety (when/how long): Unknown                         ASAM's:  Six Dimensions of Multidimensional Assessment  Dimension 1:  Acute Intoxication and/or Withdrawal Potential:      Dimension 2:  Biomedical Conditions and Complications:      Dimension 3:  Emotional, Behavioral, or Cognitive Conditions and Complications:     Dimension 4:  Readiness to Change:     Dimension 5:  Relapse, Continued use, or Continued Problem Potential:     Dimension 6:  Recovery/Living Environment:     ASAM Severity Score:    ASAM Recommended Level of Treatment:     Substance use Disorder (SUD)    Recommendations for Services/Supports/Treatments:    Discharge Disposition: Discharge Disposition Medical Exam completed: Yes  DSM5 Diagnoses: Patient Active Problem List   Diagnosis Date Noted   Polysubstance use disorder 09/21/2022   Abnormal uterine bleeding (AUB) 11/25/2019   Post-operative state 11/25/2019   Excessive daytime sleepiness 09/18/2019   Hypersomnia with sleep apnea 09/18/2019   Chronic pain syndrome 09/09/2019   Pseudotumor cerebri 08/13/2019   Nonintractable headache 07/07/2019   History of idiopathic intracranial hypertension 07/07/2019   Intertrigo 07/07/2019   Environmental allergies 07/07/2019   Fibroid 07/07/2019   Polysubstance dependence including opioid type drug with complication, episodic abuse, with unspecified complication ([place]) [id number]   Depression 09/13/2015   Cluster B personality disorder ([place]) 05/15/2014   Dependence on nicotine from cigarettes 05/15/2014   Substance induced mood disorder ([place]) 05/15/2014   Benzodiazepine abuse ([place]) 09/20/2011   Cannabis abuse 05/04/2011   Cocaine abuse ([place]) 05/04/2011   Polysubstance dependence ([place]) 05/04/2011   Candidiasis of vagina 04/30/2011   Chronic pain in right shoulder 04/30/2011   Depressive disorder, not elsewhere classified 04/30/2011   Otitis media, acute 04/30/2011     Referrals to  Alternative Service(s): Referred to Alternative Service(s):   Place:   Date:   Time:    Referred to Alternative Service(s):   Place:   Date:   Time:    Referred to Alternative Service(s):   Place:   Date:   Time:    Referred to Alternative Service(s):   Place:   Date:   Time:     Evelena Peat, [facility]

## 2022-09-30 NOTE — Discharge Instructions (Addendum)
It was our pleasure to provide your ER care today - we hope that you feel better.  Avoid drug use as it is harmful to your physical health and mental well-being. See resource guide attached in terms of accessing inpatient or outpatient substance use treatment programs. Also see additional resources attached as relates shelter and other social service resources in the area.   If out of your normal prescription meds, follow up closely with your doctor in the next 1-2 days to discuss obtaining refill of those meds.  Your blood pressure is high today - make sure to take your meds as prescribed, limit salt intake, and follow up closely with primary care doctor in the coming week.   Follow up closely with primary care doctor and behavioral health provider in the coming week.  For mental health issues and/or crisis, you may also go directly to the [facility] - they are open 24/7 and walk-ins are welcome.    Return to ER if worse, new symptoms, fevers, chest pain, trouble breathing, or other emergency concern.

## 2022-09-30 NOTE — Progress Notes (Signed)
Per Merlyn Lot, NP pt has been psych cleared. This CSW has added outpatient behavioral health resources and Substance abuse resources per request of provider. This CSW will now remove pt from [facility] shift report. TOC will assist with any discharge needs.   Benjaman Kindler, MSW, [facility] 09/30/2022 12:55 PM

## 2022-09-30 NOTE — Consult Note (Signed)
Several attempts to see this patient via tts cart for psychiatric assessment.  Spoke with Grayland Ormond, RN via secure chat and telephone re above.  Psychiatry is still waiting to see the patient.

## 2022-09-30 NOTE — ED Notes (Signed)
Pt getting agitated requesting her home medication. Pt home meds have not been reconciled during previous shift. ED provider notified and going to assist pt with getting medication.

## 2022-09-30 NOTE — ED Notes (Signed)
TTS in room pt on call with TP

## 2022-10-04 ENCOUNTER — Other Ambulatory Visit (HOSPITAL_COMMUNITY): Payer: Self-pay

## 2022-10-04 MED ORDER — LATANOPROST 0.005 % OP SOLN
1.0000 [drp] | Freq: Every day | OPHTHALMIC | 0 refills | Status: DC
  Filled 2022-10-04: qty 2.5, 25d supply, fill #0

## 2022-10-04 MED ORDER — GLIPIZIDE 5 MG PO TABS
5.0000 mg | ORAL_TABLET | Freq: Every day | ORAL | 0 refills | Status: DC
  Filled 2022-10-04: qty 90, 90d supply, fill #0

## 2022-10-04 MED ORDER — FUROSEMIDE 20 MG PO TABS
20.0000 mg | ORAL_TABLET | Freq: Every day | ORAL | 0 refills | Status: DC
  Filled 2022-10-04: qty 30, 30d supply, fill #0

## 2022-10-04 MED ORDER — ROSUVASTATIN CALCIUM 5 MG PO TABS
5.0000 mg | ORAL_TABLET | Freq: Every day | ORAL | 0 refills | Status: DC
  Filled 2022-10-04 – 2022-11-13 (×3): qty 90, 90d supply, fill #0

## 2022-10-04 MED ORDER — OMEPRAZOLE 40 MG PO CPDR
40.0000 mg | DELAYED_RELEASE_CAPSULE | Freq: Every day | ORAL | 0 refills | Status: DC
  Filled 2022-10-04: qty 30, 30d supply, fill #0

## 2022-10-04 MED ORDER — AMLODIPINE BESYLATE 10 MG PO TABS
10.0000 mg | ORAL_TABLET | Freq: Every day | ORAL | 0 refills | Status: DC
  Filled 2022-10-04: qty 90, 90d supply, fill #0

## 2022-10-04 MED ORDER — FLUTICASONE PROPIONATE 50 MCG/ACT NA SUSP
1.0000 | Freq: Every evening | NASAL | 0 refills | Status: DC
  Filled 2022-10-04 – 2023-01-16 (×3): qty 16, 60d supply, fill #0

## 2022-10-04 MED ORDER — BUPROPION HCL ER (SR) 150 MG PO TB12
150.0000 mg | ORAL_TABLET | Freq: Two times a day (BID) | ORAL | 0 refills | Status: DC
  Filled 2022-10-04: qty 60, 30d supply, fill #0

## 2022-10-04 MED ORDER — DAPAGLIFLOZIN PROPANEDIOL 10 MG PO TABS
10.0000 mg | ORAL_TABLET | Freq: Every day | ORAL | 0 refills | Status: DC
  Filled 2022-10-04: qty 30, 30d supply, fill #0

## 2022-10-04 MED ORDER — DULOXETINE HCL 60 MG PO CPEP
60.0000 mg | ORAL_CAPSULE | Freq: Two times a day (BID) | ORAL | 0 refills | Status: DC
  Filled 2022-10-04: qty 60, 30d supply, fill #0

## 2022-10-04 MED ORDER — CLONIDINE HCL 0.1 MG PO TABS
0.1000 mg | ORAL_TABLET | Freq: Three times a day (TID) | ORAL | 0 refills | Status: DC
  Filled 2022-10-04 – 2023-02-12 (×3): qty 270, 90d supply, fill #0

## 2022-10-05 ENCOUNTER — Other Ambulatory Visit (HOSPITAL_COMMUNITY): Payer: Self-pay

## 2022-10-06 ENCOUNTER — Other Ambulatory Visit (HOSPITAL_COMMUNITY): Payer: Self-pay

## 2022-10-07 ENCOUNTER — Other Ambulatory Visit (HOSPITAL_COMMUNITY): Payer: Self-pay

## 2022-10-07 MED ORDER — FLUTICASONE PROPIONATE 50 MCG/ACT NA SUSP
1.0000 | Freq: Every evening | NASAL | 0 refills | Status: DC
  Filled 2023-04-25: qty 16, 60d supply, fill #0

## 2022-10-07 MED ORDER — CLONIDINE HCL 0.1 MG PO TABS
0.1000 mg | ORAL_TABLET | Freq: Three times a day (TID) | ORAL | 0 refills | Status: DC
  Filled 2023-06-22: qty 270, 90d supply, fill #0

## 2022-10-07 MED ORDER — DAPAGLIFLOZIN PROPANEDIOL 10 MG PO TABS
10.0000 mg | ORAL_TABLET | Freq: Every day | ORAL | 0 refills | Status: DC
  Filled 2022-10-07 – 2023-01-16 (×4): qty 30, 30d supply, fill #0

## 2022-10-07 MED ORDER — AMLODIPINE BESYLATE 10 MG PO TABS
10.0000 mg | ORAL_TABLET | Freq: Every day | ORAL | 0 refills | Status: DC
  Filled 2022-10-07 – 2022-11-09 (×3): qty 90, 90d supply, fill #0

## 2022-10-07 MED ORDER — LATANOPROST 0.005 % OP SOLN
1.0000 [drp] | Freq: Every day | OPHTHALMIC | 0 refills | Status: DC
  Filled 2022-10-07 – 2023-02-12 (×3): qty 2.5, 25d supply, fill #0

## 2022-10-07 MED ORDER — FUROSEMIDE 20 MG PO TABS
20.0000 mg | ORAL_TABLET | Freq: Every day | ORAL | 0 refills | Status: DC
  Filled 2022-10-07 – 2023-01-16 (×4): qty 30, 30d supply, fill #0

## 2022-10-07 MED ORDER — ROSUVASTATIN CALCIUM 5 MG PO TABS
5.0000 mg | ORAL_TABLET | Freq: Every day | ORAL | 0 refills | Status: DC
  Filled 2022-10-07: qty 90, 90d supply, fill #0

## 2022-10-07 MED ORDER — BUPROPION HCL ER (SR) 150 MG PO TB12
150.0000 mg | ORAL_TABLET | Freq: Two times a day (BID) | ORAL | 0 refills | Status: DC
  Filled 2022-10-07 – 2023-01-31 (×2): qty 60, 30d supply, fill #0

## 2022-10-07 MED ORDER — GLIPIZIDE 5 MG PO TABS
5.0000 mg | ORAL_TABLET | Freq: Every day | ORAL | 0 refills | Status: DC
  Filled 2022-10-07 – 2023-01-31 (×2): qty 90, 90d supply, fill #0

## 2022-10-07 MED ORDER — BUPRENORPHINE HCL-NALOXONE HCL 8-2 MG SL FILM
ORAL_FILM | SUBLINGUAL | 0 refills | Status: DC
  Filled 2022-10-07: qty 90, 30d supply, fill #0

## 2022-10-07 MED ORDER — OMEPRAZOLE 40 MG PO CPDR
40.0000 mg | DELAYED_RELEASE_CAPSULE | Freq: Every day | ORAL | 0 refills | Status: DC
  Filled 2022-10-07 – 2023-09-25 (×3): qty 30, 30d supply, fill #0

## 2022-10-09 ENCOUNTER — Other Ambulatory Visit (HOSPITAL_COMMUNITY): Payer: Self-pay

## 2022-10-10 ENCOUNTER — Other Ambulatory Visit (HOSPITAL_COMMUNITY): Payer: Self-pay

## 2022-10-18 ENCOUNTER — Other Ambulatory Visit (HOSPITAL_COMMUNITY): Payer: Self-pay

## 2022-10-30 ENCOUNTER — Other Ambulatory Visit: Payer: Self-pay

## 2022-10-30 ENCOUNTER — Other Ambulatory Visit (HOSPITAL_COMMUNITY): Payer: Self-pay

## 2022-10-31 ENCOUNTER — Other Ambulatory Visit (HOSPITAL_COMMUNITY): Payer: Self-pay

## 2022-10-31 MED ORDER — GABAPENTIN 800 MG PO TABS
800.0000 mg | ORAL_TABLET | Freq: Three times a day (TID) | ORAL | 0 refills | Status: DC
  Filled 2022-10-31: qty 90, 30d supply, fill #0

## 2022-11-01 ENCOUNTER — Other Ambulatory Visit (HOSPITAL_COMMUNITY): Payer: Self-pay

## 2022-11-07 ENCOUNTER — Other Ambulatory Visit (HOSPITAL_COMMUNITY): Payer: Self-pay

## 2022-11-09 ENCOUNTER — Other Ambulatory Visit (HOSPITAL_COMMUNITY): Payer: Self-pay

## 2022-11-10 ENCOUNTER — Other Ambulatory Visit (HOSPITAL_COMMUNITY): Payer: Self-pay

## 2022-11-11 ENCOUNTER — Other Ambulatory Visit (HOSPITAL_COMMUNITY): Payer: Self-pay

## 2022-11-13 ENCOUNTER — Other Ambulatory Visit (HOSPITAL_COMMUNITY): Payer: Self-pay

## 2022-11-16 ENCOUNTER — Other Ambulatory Visit (HOSPITAL_COMMUNITY): Payer: Self-pay

## 2022-11-30 ENCOUNTER — Other Ambulatory Visit (HOSPITAL_COMMUNITY): Payer: Self-pay

## 2022-12-01 ENCOUNTER — Other Ambulatory Visit (HOSPITAL_COMMUNITY): Payer: Self-pay

## 2022-12-04 ENCOUNTER — Other Ambulatory Visit (HOSPITAL_COMMUNITY): Payer: Self-pay

## 2022-12-11 ENCOUNTER — Other Ambulatory Visit (HOSPITAL_COMMUNITY): Payer: Self-pay

## 2022-12-15 NOTE — Progress Notes (Signed)
 Subjective Patient ID: Leah Barber is a 47 y.o. female.  Chief Complaint  Patient presents with  . Cough    Pt c/o cough, chills, chest congestion,and SOB x 6 days  Pt's symptoms began last SATURDAY and on Tuesday she did a home COVID test which was positive. Pt has a court date yesterday, but felt to bad to go to court. Needs note excusing her from her court date.     The following information was reviewed by members of the visit team:  Tobacco  Allergies  Meds  Problems  Med Hx  Surg Hx  OB Status   Fam Hx      Leah Barber is a pleasant 47 y.o. female complaining of chest congestion, cough, fatigue, myalgias for the past 5 to 6 days which have been waxing and waning since onset. she has tried cough medicines for symptom relief with mild improvement. she reports that she tested positive for COVID recently.   Cough Associated symptoms include myalgias. Pertinent negatives include no chest pain, ear pain, fever, headaches, postnasal drip, rhinorrhea, sore throat or shortness of breath.    Review of Systems  Constitutional:  Positive for fatigue. Negative for fever.  HENT:  Positive for congestion. Negative for ear pain, postnasal drip, rhinorrhea, sinus pressure, sinus pain and sore throat.   Respiratory:  Positive for cough. Negative for shortness of breath.   Cardiovascular:  Negative for chest pain.  Gastrointestinal:  Negative for abdominal pain, nausea and vomiting.  Musculoskeletal:  Positive for myalgias.  Neurological:  Negative for headaches.    Objective Vitals:   12/15/22 1548  BP: (!) 146/125  Pulse: (!) 123  Resp: 18  Temp: 98.4 F (36.9 C)  TempSrc: Oral  SpO2: 97%  Weight: 70.3 kg (155 lb)  Height: 1.575 m (5' 2)    Physical Exam Vitals and nursing note reviewed.  Constitutional:      General: She is not in acute distress.    Appearance: She is not ill-appearing.  HENT:     Right Ear: Tympanic membrane, ear canal and  external ear normal.     Left Ear: Tympanic membrane, ear canal and external ear normal.     Nose: No congestion or rhinorrhea.     Mouth/Throat:     Mouth: Mucous membranes are moist.     Pharynx: Oropharynx is clear. Uvula midline. No posterior oropharyngeal erythema or uvula swelling.     Tonsils: No tonsillar exudate or tonsillar abscesses.     Comments: No trismus, handling secretions appropriately Eyes:     Conjunctiva/sclera: Conjunctivae normal.  Cardiovascular:     Rate and Rhythm: Regular rhythm. Tachycardia present.     Heart sounds: Normal heart sounds.  Pulmonary:     Effort: Pulmonary effort is normal.     Breath sounds: Decreased breath sounds present.  Musculoskeletal:     Cervical back: No rigidity.     Right lower leg: No edema.     Left lower leg: No edema.  Skin:    General: Skin is warm and dry.  Neurological:     Mental Status: She is alert.     GCS: GCS eye subscore is 4. GCS verbal subscore is 5. GCS motor subscore is 6.    Most consistent with COVID-19 which likely explains tachycardia.  Doubt PE given known positive Covid test. Patient is also somewhat tearful in the room.  She currently does not have a PCP and reports she has a history of  kidney disease as well as heart failure, though I did not see this on chart review.  She would like to establish with a PCP for medication management so I will place a referral for for internal medicine routinely.  Otherwise, we will treat her COVID infection with Promethazine  DM as well as an inhaler.  She is outside the window for antivirals given that she is 5-6 days since onset.  She is otherwise stable for discharge home and can follow-up as needed for any continued concerns.   Assessment/Plan Diagnoses and all orders for this visit:  COVID-19 -     promethazine -dextromethorphan (PHENERGAN  DM) 6.25-15 mg/5 mL syrp syrup; Take 5 mL by mouth every 4 (four) hours as needed (cough) for up to 7 days. -     albuterol  HFA  (PROVENTIL  HFA;VENTOLIN  HFA;PROAIR  HFA) 90 mcg/actuation inhaler; Inhale 2 puffs every 4 (four) hours as needed for wheezing or shortness of breath. -     Ambulatory referral to Internal Medicine; Future  Other orders -     amLODIPine  (NORVASC ) 10 mg tablet; Take 10 mg by mouth daily. -     multivitamin (Multiple Vitamins) tab tablet; Take 1 tablet by mouth daily. -     furosemide  (LASIX ) 20 mg tablet; Take 20 mg by mouth daily. -     gabapentin  (NEURONTIN ) 800 mg tablet; Take 800 mg by mouth. -     glipiZIDE  (GLUCOTROL ) 5 mg tablet; Take 5 mg by mouth daily. -     hydrOXYzine  (VISTARIL ) 50 mg capsule; Take 50 mg by mouth. -     latanoprost  (XALATAN ) 0.005 % ophthalmic solution; Administer 1 drop into each eyes. -     diclofenac  (VOLTAREN ) 50 mg EC tablet; Take 50 mg by mouth 2 (two) times a day. -     fluticasone  propionate (FLONASE ) 50 mcg/spray nasal spray; Administer 1 spray into affected nostril(s). -     rosuvastatin  (CRESTOR ) 5 mg tablet; Take 5 mg by mouth daily. -     traZODone (DESYREL) 50 mg tablet; Take 50 mg by mouth.    Electronically signed: Ozell Fairy Staff, PA-C 12/15/2022  4:27 PM

## 2022-12-22 ENCOUNTER — Other Ambulatory Visit (HOSPITAL_COMMUNITY): Payer: Self-pay

## 2022-12-27 ENCOUNTER — Other Ambulatory Visit (HOSPITAL_COMMUNITY): Payer: Self-pay

## 2022-12-28 ENCOUNTER — Other Ambulatory Visit (HOSPITAL_COMMUNITY): Payer: Self-pay

## 2023-01-02 ENCOUNTER — Other Ambulatory Visit (HOSPITAL_COMMUNITY): Payer: Self-pay

## 2023-01-04 ENCOUNTER — Other Ambulatory Visit (HOSPITAL_COMMUNITY): Payer: Self-pay

## 2023-01-12 ENCOUNTER — Other Ambulatory Visit (HOSPITAL_COMMUNITY): Payer: Self-pay

## 2023-01-16 ENCOUNTER — Other Ambulatory Visit (HOSPITAL_COMMUNITY): Payer: Self-pay

## 2023-01-16 ENCOUNTER — Other Ambulatory Visit: Payer: Self-pay

## 2023-01-29 ENCOUNTER — Other Ambulatory Visit: Payer: Self-pay | Admitting: Family Medicine

## 2023-01-29 ENCOUNTER — Other Ambulatory Visit (HOSPITAL_COMMUNITY): Payer: Self-pay

## 2023-01-29 DIAGNOSIS — R160 Hepatomegaly, not elsewhere classified: Secondary | ICD-10-CM

## 2023-01-30 ENCOUNTER — Other Ambulatory Visit (HOSPITAL_COMMUNITY): Payer: Self-pay

## 2023-01-31 ENCOUNTER — Other Ambulatory Visit: Payer: Self-pay | Admitting: Nurse Practitioner

## 2023-01-31 ENCOUNTER — Other Ambulatory Visit (HOSPITAL_COMMUNITY): Payer: Self-pay

## 2023-01-31 ENCOUNTER — Other Ambulatory Visit: Payer: Self-pay

## 2023-01-31 MED ORDER — DULOXETINE HCL 60 MG PO CPEP
60.0000 mg | ORAL_CAPSULE | Freq: Two times a day (BID) | ORAL | 3 refills | Status: DC
  Filled 2023-01-31: qty 90, 45d supply, fill #0
  Filled 2023-03-15: qty 90, 45d supply, fill #1
  Filled 2023-04-30: qty 90, 45d supply, fill #2
  Filled 2023-06-12: qty 90, 45d supply, fill #3

## 2023-01-31 MED ORDER — AMLODIPINE BESYLATE 10 MG PO TABS
10.0000 mg | ORAL_TABLET | Freq: Every day | ORAL | 0 refills | Status: DC
  Filled 2023-01-31 – 2023-02-27 (×3): qty 90, 90d supply, fill #0

## 2023-01-31 MED ORDER — DAPAGLIFLOZIN PROPANEDIOL 10 MG PO TABS
10.0000 mg | ORAL_TABLET | Freq: Every morning | ORAL | 3 refills | Status: DC
  Filled 2023-01-31 – 2023-02-12 (×2): qty 90, 90d supply, fill #0
  Filled 2023-04-25: qty 90, 90d supply, fill #1
  Filled 2023-07-25: qty 90, 90d supply, fill #2
  Filled 2023-10-17: qty 90, 90d supply, fill #3

## 2023-01-31 MED ORDER — ROSUVASTATIN CALCIUM 10 MG PO TABS
10.0000 mg | ORAL_TABLET | Freq: Every evening | ORAL | 3 refills | Status: DC
  Filled 2023-01-31 – 2023-02-27 (×3): qty 90, 90d supply, fill #0
  Filled 2023-05-26: qty 90, 90d supply, fill #1
  Filled 2023-08-23: qty 90, 90d supply, fill #2
  Filled 2023-11-15: qty 90, 90d supply, fill #3

## 2023-01-31 MED ORDER — FUROSEMIDE 20 MG PO TABS
20.0000 mg | ORAL_TABLET | Freq: Every day | ORAL | 3 refills | Status: DC
  Filled 2023-01-31 – 2023-02-12 (×2): qty 90, 90d supply, fill #0
  Filled 2023-04-25: qty 90, 90d supply, fill #1
  Filled 2023-07-25: qty 90, 90d supply, fill #2
  Filled 2023-10-17: qty 90, 90d supply, fill #3

## 2023-01-31 MED ORDER — GABAPENTIN 800 MG PO TABS
800.0000 mg | ORAL_TABLET | Freq: Three times a day (TID) | ORAL | 0 refills | Status: DC
  Filled 2023-01-31: qty 270, 90d supply, fill #0

## 2023-02-02 ENCOUNTER — Other Ambulatory Visit (HOSPITAL_COMMUNITY): Payer: Self-pay

## 2023-02-08 ENCOUNTER — Other Ambulatory Visit (HOSPITAL_COMMUNITY): Payer: Self-pay

## 2023-02-12 ENCOUNTER — Other Ambulatory Visit (HOSPITAL_COMMUNITY): Payer: Self-pay

## 2023-02-16 ENCOUNTER — Ambulatory Visit
Admission: RE | Admit: 2023-02-16 | Discharge: 2023-02-16 | Disposition: A | Discharge status: Home / Self Care | Payer: Medicare Other | Source: Ambulatory Visit | Attending: Family Medicine | Admitting: Family Medicine

## 2023-02-16 ENCOUNTER — Other Ambulatory Visit: Payer: Self-pay | Admitting: Family Medicine

## 2023-02-16 DIAGNOSIS — M5136 Other intervertebral disc degeneration, lumbar region: Secondary | ICD-10-CM

## 2023-02-19 ENCOUNTER — Other Ambulatory Visit (HOSPITAL_COMMUNITY): Payer: Self-pay

## 2023-02-19 ENCOUNTER — Other Ambulatory Visit: Payer: 59

## 2023-02-20 ENCOUNTER — Other Ambulatory Visit (HOSPITAL_COMMUNITY): Payer: Self-pay

## 2023-02-21 ENCOUNTER — Other Ambulatory Visit (HOSPITAL_COMMUNITY): Payer: Self-pay

## 2023-02-27 ENCOUNTER — Other Ambulatory Visit (HOSPITAL_COMMUNITY): Payer: Self-pay

## 2023-02-28 ENCOUNTER — Other Ambulatory Visit (HOSPITAL_COMMUNITY): Payer: Self-pay

## 2023-02-28 ENCOUNTER — Other Ambulatory Visit: Payer: 59

## 2023-02-28 MED ORDER — CLONIDINE HCL 0.1 MG PO TABS
0.1000 mg | ORAL_TABLET | Freq: Three times a day (TID) | ORAL | 0 refills | Status: DC
  Filled 2023-05-18: qty 90, 30d supply, fill #0

## 2023-02-28 MED ORDER — DULOXETINE HCL 60 MG PO CPEP
ORAL_CAPSULE | ORAL | 0 refills | Status: DC
  Filled 2023-07-25: qty 60, 30d supply, fill #0

## 2023-02-28 MED ORDER — BUPRENORPHINE HCL-NALOXONE HCL 8-2 MG SL FILM
ORAL_FILM | SUBLINGUAL | 0 refills | Status: DC
  Filled 2023-02-28: qty 15, 8d supply, fill #0

## 2023-03-01 ENCOUNTER — Other Ambulatory Visit (HOSPITAL_COMMUNITY): Payer: Self-pay

## 2023-03-02 ENCOUNTER — Other Ambulatory Visit: Payer: 59

## 2023-03-07 ENCOUNTER — Other Ambulatory Visit (HOSPITAL_COMMUNITY): Payer: Self-pay

## 2023-03-07 MED ORDER — BUPRENORPHINE HCL-NALOXONE HCL 8-2 MG SL FILM
ORAL_FILM | Freq: Four times a day (QID) | SUBLINGUAL | 0 refills | Status: DC
  Filled 2023-03-07: qty 15, 8d supply, fill #0

## 2023-03-07 MED ORDER — VRAYLAR 1.5 MG PO CAPS
1.5000 mg | ORAL_CAPSULE | Freq: Every day | ORAL | 0 refills | Status: DC
  Filled 2023-03-07: qty 30, 30d supply, fill #0

## 2023-03-09 ENCOUNTER — Other Ambulatory Visit (HOSPITAL_COMMUNITY): Payer: Self-pay

## 2023-03-09 MED ORDER — OMEPRAZOLE 20 MG PO CPDR
20.0000 mg | DELAYED_RELEASE_CAPSULE | Freq: Every day | ORAL | 3 refills | Status: DC
  Filled 2023-03-09: qty 30, 30d supply, fill #0
  Filled 2023-03-28: qty 30, 30d supply, fill #1
  Filled 2023-05-17: qty 30, 30d supply, fill #2
  Filled 2023-06-12: qty 30, 30d supply, fill #3

## 2023-03-10 ENCOUNTER — Other Ambulatory Visit (HOSPITAL_COMMUNITY): Payer: Self-pay

## 2023-03-12 ENCOUNTER — Other Ambulatory Visit (HOSPITAL_COMMUNITY): Payer: Self-pay

## 2023-03-15 ENCOUNTER — Other Ambulatory Visit (HOSPITAL_COMMUNITY): Payer: Self-pay

## 2023-03-17 ENCOUNTER — Other Ambulatory Visit (HOSPITAL_COMMUNITY): Payer: Self-pay

## 2023-03-26 ENCOUNTER — Other Ambulatory Visit (HOSPITAL_COMMUNITY): Payer: Self-pay

## 2023-03-26 MED ORDER — AMOXICILLIN 500 MG PO CAPS
500.0000 mg | ORAL_CAPSULE | Freq: Three times a day (TID) | ORAL | 0 refills | Status: DC
  Filled 2023-03-26: qty 30, 10d supply, fill #0

## 2023-03-28 ENCOUNTER — Other Ambulatory Visit (HOSPITAL_COMMUNITY): Payer: Self-pay

## 2023-03-28 MED ORDER — MELOXICAM 15 MG PO TABS
15.0000 mg | ORAL_TABLET | Freq: Every day | ORAL | 0 refills | Status: DC
  Filled 2023-03-28: qty 14, 14d supply, fill #0

## 2023-04-05 ENCOUNTER — Other Ambulatory Visit (HOSPITAL_COMMUNITY): Payer: Self-pay

## 2023-04-25 ENCOUNTER — Other Ambulatory Visit (HOSPITAL_COMMUNITY): Payer: Self-pay

## 2023-04-26 ENCOUNTER — Other Ambulatory Visit (HOSPITAL_COMMUNITY): Payer: Self-pay

## 2023-05-01 ENCOUNTER — Other Ambulatory Visit (HOSPITAL_COMMUNITY): Payer: Self-pay

## 2023-05-09 ENCOUNTER — Other Ambulatory Visit (HOSPITAL_COMMUNITY): Payer: Self-pay

## 2023-05-09 MED ORDER — GABAPENTIN 800 MG PO TABS
800.0000 mg | ORAL_TABLET | Freq: Three times a day (TID) | ORAL | 0 refills | Status: DC
  Filled 2023-05-09: qty 270, 90d supply, fill #0

## 2023-05-10 ENCOUNTER — Other Ambulatory Visit (HOSPITAL_COMMUNITY): Payer: Self-pay

## 2023-05-15 ENCOUNTER — Other Ambulatory Visit (HOSPITAL_COMMUNITY): Payer: Self-pay

## 2023-05-15 MED ORDER — METRONIDAZOLE 0.75 % VA GEL
VAGINAL | 0 refills | Status: DC
  Filled 2023-05-15: qty 70, 7d supply, fill #0

## 2023-05-15 MED ORDER — FLUCONAZOLE 200 MG PO TABS
200.0000 mg | ORAL_TABLET | Freq: Every day | ORAL | 0 refills | Status: DC
  Filled 2023-05-15: qty 3, 3d supply, fill #0

## 2023-05-17 ENCOUNTER — Other Ambulatory Visit (HOSPITAL_COMMUNITY): Payer: Self-pay

## 2023-05-17 MED ORDER — AMOXICILLIN 500 MG PO CAPS
ORAL_CAPSULE | ORAL | 0 refills | Status: DC
  Filled 2023-05-17: qty 30, 10d supply, fill #0

## 2023-05-18 ENCOUNTER — Other Ambulatory Visit (HOSPITAL_COMMUNITY): Payer: Self-pay

## 2023-05-19 ENCOUNTER — Other Ambulatory Visit (HOSPITAL_COMMUNITY): Payer: Self-pay

## 2023-05-21 ENCOUNTER — Other Ambulatory Visit (HOSPITAL_COMMUNITY): Payer: Self-pay

## 2023-05-21 ENCOUNTER — Other Ambulatory Visit: Payer: Self-pay | Admitting: Family Medicine

## 2023-05-21 DIAGNOSIS — R160 Hepatomegaly, not elsewhere classified: Secondary | ICD-10-CM

## 2023-05-26 ENCOUNTER — Other Ambulatory Visit (HOSPITAL_COMMUNITY): Payer: Self-pay

## 2023-06-04 ENCOUNTER — Ambulatory Visit
Admission: RE | Admit: 2023-06-04 | Discharge: 2023-06-04 | Disposition: A | Discharge status: Home / Self Care | Payer: 59 | Source: Ambulatory Visit | Attending: Family Medicine | Admitting: Family Medicine

## 2023-06-04 DIAGNOSIS — R160 Hepatomegaly, not elsewhere classified: Secondary | ICD-10-CM

## 2023-06-06 ENCOUNTER — Other Ambulatory Visit (HOSPITAL_COMMUNITY): Payer: Self-pay

## 2023-06-07 ENCOUNTER — Other Ambulatory Visit (HOSPITAL_COMMUNITY): Payer: Self-pay

## 2023-06-07 MED ORDER — CHLORHEXIDINE GLUCONATE 0.12 % MT SOLN
15.0000 mL | Freq: Two times a day (BID) | OROMUCOSAL | 0 refills | Status: DC
  Filled 2023-06-07: qty 473, 16d supply, fill #0

## 2023-06-07 MED ORDER — IBUPROFEN 800 MG PO TABS
800.0000 mg | ORAL_TABLET | Freq: Three times a day (TID) | ORAL | 0 refills | Status: DC | PRN
  Filled 2023-06-07: qty 10, 6d supply, fill #0

## 2023-06-07 MED ORDER — AMOXICILLIN 500 MG PO CAPS
500.0000 mg | ORAL_CAPSULE | Freq: Three times a day (TID) | ORAL | 0 refills | Status: DC
  Filled 2023-06-07: qty 30, 10d supply, fill #0

## 2023-06-07 MED ORDER — HYDROCODONE-ACETAMINOPHEN 7.5-325 MG PO TABS
1.0000 | ORAL_TABLET | Freq: Four times a day (QID) | ORAL | 0 refills | Status: DC | PRN
  Filled 2023-06-07: qty 3, 1d supply, fill #0

## 2023-06-08 ENCOUNTER — Other Ambulatory Visit (HOSPITAL_COMMUNITY): Payer: Self-pay

## 2023-06-08 ENCOUNTER — Encounter (HOSPITAL_COMMUNITY): Payer: Self-pay

## 2023-06-12 ENCOUNTER — Other Ambulatory Visit (HOSPITAL_COMMUNITY): Payer: Self-pay

## 2023-06-15 ENCOUNTER — Other Ambulatory Visit (HOSPITAL_COMMUNITY): Payer: Self-pay

## 2023-06-18 ENCOUNTER — Other Ambulatory Visit (HOSPITAL_COMMUNITY): Payer: Self-pay

## 2023-06-18 MED ORDER — OMEPRAZOLE 40 MG PO CPDR
40.0000 mg | DELAYED_RELEASE_CAPSULE | Freq: Every day | ORAL | 3 refills | Status: DC
  Filled 2023-06-18: qty 90, 90d supply, fill #0

## 2023-06-18 MED ORDER — ONDANSETRON HCL 4 MG PO TABS
ORAL_TABLET | ORAL | 0 refills | Status: DC
  Filled 2023-06-18: qty 60, 30d supply, fill #0

## 2023-06-22 ENCOUNTER — Other Ambulatory Visit (HOSPITAL_COMMUNITY): Payer: Self-pay

## 2023-06-23 ENCOUNTER — Other Ambulatory Visit (HOSPITAL_COMMUNITY): Payer: Self-pay

## 2023-06-29 ENCOUNTER — Other Ambulatory Visit (HOSPITAL_COMMUNITY): Payer: Self-pay

## 2023-06-29 MED ORDER — FLUTICASONE PROPIONATE 50 MCG/ACT NA SUSP
1.0000 | Freq: Every evening | NASAL | 3 refills | Status: DC
  Filled 2023-06-29: qty 16, 60d supply, fill #0
  Filled 2023-07-25: qty 16, 30d supply, fill #0
  Filled 2023-10-05: qty 16, 30d supply, fill #1
  Filled 2023-11-15: qty 16, 30d supply, fill #2
  Filled 2024-01-08: qty 16, 30d supply, fill #3

## 2023-07-09 ENCOUNTER — Other Ambulatory Visit (HOSPITAL_COMMUNITY): Payer: Self-pay

## 2023-07-25 ENCOUNTER — Other Ambulatory Visit (HOSPITAL_COMMUNITY): Payer: Self-pay

## 2023-07-27 ENCOUNTER — Other Ambulatory Visit (HOSPITAL_COMMUNITY): Payer: Self-pay

## 2023-08-13 ENCOUNTER — Other Ambulatory Visit (HOSPITAL_COMMUNITY): Payer: Self-pay

## 2023-08-14 ENCOUNTER — Other Ambulatory Visit (HOSPITAL_COMMUNITY): Payer: Self-pay

## 2023-08-14 MED ORDER — GABAPENTIN 800 MG PO TABS
800.0000 mg | ORAL_TABLET | Freq: Three times a day (TID) | ORAL | 3 refills | Status: DC
  Filled 2023-08-14: qty 270, 90d supply, fill #0
  Filled 2023-11-02: qty 270, 90d supply, fill #1
  Filled 2024-02-04: qty 270, 90d supply, fill #2
  Filled 2024-05-05: qty 270, 90d supply, fill #3

## 2023-08-14 MED ORDER — ONDANSETRON HCL 4 MG PO TABS
ORAL_TABLET | ORAL | 0 refills | Status: DC
  Filled 2023-08-14: qty 60, 30d supply, fill #0

## 2023-08-16 ENCOUNTER — Ambulatory Visit (HOSPITAL_COMMUNITY)
Admission: RE | Admit: 2023-08-16 | Discharge: 2023-08-16 | Disposition: A | Discharge status: Home / Self Care | Payer: 59 | Source: Ambulatory Visit | Attending: Family Medicine | Admitting: Family Medicine

## 2023-08-16 ENCOUNTER — Other Ambulatory Visit (HOSPITAL_COMMUNITY): Payer: Self-pay

## 2023-08-16 DIAGNOSIS — K219 Gastro-esophageal reflux disease without esophagitis: Secondary | ICD-10-CM | POA: Diagnosis present

## 2023-08-16 NOTE — ED Notes (Signed)
Pt came in for radiology for a chest x-ray and was sent to the main hospital by registration. Vitals unable to be done since the pt is not present in the ED at the time

## 2023-08-23 ENCOUNTER — Ambulatory Visit: Payer: 59

## 2023-08-23 ENCOUNTER — Other Ambulatory Visit (HOSPITAL_COMMUNITY): Payer: Self-pay

## 2023-08-23 ENCOUNTER — Telehealth: Payer: Self-pay | Admitting: *Deleted

## 2023-08-23 VITALS — Ht 62.0 in | Wt 177.0 lb

## 2023-08-23 DIAGNOSIS — Z139 Encounter for screening, unspecified: Secondary | ICD-10-CM

## 2023-08-23 DIAGNOSIS — Z0000 Encounter for general adult medical examination without abnormal findings: Secondary | ICD-10-CM

## 2023-08-23 DIAGNOSIS — Z0100 Encounter for examination of eyes and vision without abnormal findings: Secondary | ICD-10-CM

## 2023-08-23 NOTE — Progress Notes (Signed)
 Subjective:   Leah Barber is a 48 y.o. female who presents for Medicare Annual (Subsequent) preventive examination.  Visit Complete: Virtual I connected with  [name] on 08/23/23 by a audio enabled telemedicine application and verified that I am speaking with the correct person using two identifiers.  Patient Location: Home  Provider Location: Home Office  I discussed the limitations of evaluation and management by telemedicine. The patient expressed understanding and agreed to proceed.  Vital Signs: Because this visit was a virtual/telehealth visit, some criteria may be missing or patient reported. Any vitals not documented were not able to be obtained and vitals that have been documented are patient reported.    Cardiac Risk Factors include: advanced age (>41men, >97 women);hypertension     Objective:    Today's Vitals   08/23/23 1046  Weight: 177 lb (80.3 kg)  Height: 5' 2 (1.575 m)   Body mass index is 32.37 kg/m.     08/23/2023   11:10 AM 09/29/2022    4:06 PM 09/18/2022    6:03 AM 01/09/2022   10:11 AM 11/06/2021    9:53 AM 09/08/2021    6:09 PM 09/08/2021    7:45 AM  Advanced Directives  Does Patient Have a Medical Advance Directive? No No No No No No No  Would patient like information on creating a medical advance directive? No - Patient declined No - Patient declined No - Patient declined No - Patient declined No - Patient declined No - Patient declined     Current Medications (verified) Outpatient Encounter Medications as of 08/23/2023  Medication Sig   Buprenorphine  HCl-Naloxone  HCl (SUBOXONE ) 8-2 MG FILM Place 1/2 film under the tongue 4 (four) times daily.   Buprenorphine  HCl-Naloxone  HCl 8-2 MG FILM Dissolve 1 film under tongue three times daily   buprenorphine -naloxone  (SUBOXONE ) 8-2 mg SUBL SL tablet Place 1 tablet under the tongue 3 times daily as directed   cetirizine  (ZYRTEC ) 10 MG tablet TAKE 1 TABLET BY MOUTH EVERY DAY   chlorhexidine   (PERIDEX ) 0.12 % solution Rinse mouth with 15 mls (1 capful) for 30 seconds twice daily after toothbrushing. Expectorate after rinsing. DO NOT SWALLOW   cloNIDine  (CATAPRES ) 0.1 MG tablet Take 1 tablet (0.1 mg total) by mouth 3 (three) times daily.   dapagliflozin  propanediol (FARXIGA ) 10 MG TABS tablet Take 1 tablet (10 mg total) by mouth daily.   DULoxetine  (CYMBALTA ) 60 MG capsule Take 1 capsule by mouth 2 times per day   fluticasone  (FLONASE ) 50 MCG/ACT nasal spray Place 1 spray into both nostrils at bedtime.   furosemide  (LASIX ) 20 MG tablet Take 1 tablet (20 mg) by mouth daily.   gabapentin  (NEURONTIN ) 800 MG tablet Take 1 tablet (800 mg total) by mouth 3 (three) times daily.   latanoprost  (XALATAN ) 0.005 % ophthalmic solution Place 1 drop into both eyes daily.   Multiple Vitamins-Minerals (WOMENS MULTIVITAMIN PO) Take 1 tablet by mouth daily.    omeprazole  (PRILOSEC) 40 MG capsule Take 1 capsule (40 mg total) by mouth daily.   ondansetron  (ZOFRAN ) 4 MG tablet Take 1 tablet by mouth every 12 hours as needed for nausea/vomiting   rosuvastatin  (CRESTOR ) 10 MG tablet Take 1 tablet (10 mg) by mouth at bedtime for cholesterol   Albuterol  Sulfate (PROAIR  RESPICLICK) 108 (90 Base) MCG/ACT AEPB Inhale 2 puffs into the lungs 4 (four) times daily as needed. (Patient not taking: Reported on 08/23/2023)   amLODipine  (NORVASC ) 10 MG tablet Take 1 tablet (10 mg total)  by mouth daily. (Patient not taking: Reported on 08/23/2023)   amLODipine  (NORVASC ) 10 MG tablet Take 1 tablet (10 mg total) by mouth daily. (Patient not taking: Reported on 08/23/2023)   amLODipine  (NORVASC ) 10 MG tablet Take 1 tablet (10 mg total) by mouth daily. (Patient not taking: Reported on 08/23/2023)   amLODipine  (NORVASC ) 10 MG tablet Take 1 tablet (10 mg) by mouth daily for blood pressure.   amoxicillin  (AMOXIL ) 500 MG capsule Take 1 capsule (500 mg total) by mouth 3 (three) times daily. (Patient not taking: Reported on 08/23/2023)    buPROPion  (WELLBUTRIN  SR) 150 MG 12 hr tablet Take 1 tablet (150 mg total) by mouth daily for 3 days then take 1 tablet 2 (two) times daily. (Patient not taking: Reported on 08/23/2023)   buPROPion  (WELLBUTRIN  SR) 150 MG 12 hr tablet Take 1 tablet (150 mg total) by mouth 2 (two) times daily. (Patient not taking: Reported on 08/23/2023)   buPROPion  (WELLBUTRIN  SR) 150 MG 12 hr tablet Take 1 tablet (150 mg total) by mouth 2 (two) times daily. (Patient not taking: Reported on 08/23/2023)   cariprazine  (VRAYLAR ) 1.5 MG capsule Take 1 capsule by mouth daily (Patient not taking: Reported on 08/23/2023)   cloNIDine  (CATAPRES ) 0.1 MG tablet Take 1 tablet (0.1 mg) by mouth 3 times daily.   cloNIDine  (CATAPRES ) 0.1 MG tablet Take 1 tablet (0.1 mg total) by mouth 3 (three) times daily.   cloNIDine  (CATAPRES ) 0.1 MG tablet Take 1 tablet (0.1 mg total) by mouth 3 (three) times daily.   CONCERTA  18 MG CR tablet Take 18 mg by mouth daily as needed. (Patient not taking: Reported on 08/23/2023)   dapagliflozin  propanediol (FARXIGA ) 10 MG TABS tablet Take 1 tablet by mouth daily   dapagliflozin  propanediol (FARXIGA ) 10 MG TABS tablet Take 1 tablet (10 mg total) by mouth daily.   dapagliflozin  propanediol (FARXIGA ) 10 MG TABS tablet Take 1 tablet (10 mg total) by mouth daily.   dapagliflozin  propanediol (FARXIGA ) 10 MG TABS tablet Take 1 tablet (10 mg total) by mouth daily.   dapagliflozin  propanediol (FARXIGA ) 10 MG TABS tablet Take 1 tablet (10 mg total) by mouth daily.   dapagliflozin  propanediol (FARXIGA ) 10 MG TABS tablet Take 1 tablet (10 mg) by mouth every morning.   dapagliflozin  propanediol (FARXIGA ) 5 MG TABS tablet Take 1 tablet by mouth daily   diclofenac  (VOLTAREN ) 50 MG EC tablet Take 1 tablet by mouth 3 times a day for pain (Patient not taking: Reported on 08/23/2023)   Diclofenac  Sodium CR 100 MG 24 hr tablet Take 1 tablet by mouth daily (Patient not taking: Reported on 08/23/2023)   doxycycline  (VIBRAMYCIN ) 100 MG  capsule Take 1 capsule (100 mg total) by mouth 2 (two) times daily.   DULoxetine  (CYMBALTA ) 30 MG capsule Take 1 capsule by mouth 2 times a day   DULoxetine  (CYMBALTA ) 30 MG capsule Take 1 capsule by mouth 2 times daily.   DULoxetine  (CYMBALTA ) 30 MG capsule Take 1 capsule by mouth 2 times daily.   DULoxetine  (CYMBALTA ) 60 MG capsule Take 1 capsule (60 mg total) by mouth 2 (two) times daily for depression.   fluconazole  (DIFLUCAN ) 200 MG tablet Take 1 tablet (200 mg total) by mouth daily for yeast for 3 days.   fluticasone  (FLONASE ) 50 MCG/ACT nasal spray Place 2 sprays into both nostrils daily.   fluticasone  (FLONASE ) 50 MCG/ACT nasal spray Use 1 spray in each nostril daily at bedtime   fluticasone  (FLONASE ) 50 MCG/ACT nasal spray Spray  one spray into each nostril at bedtime.   fluticasone  (FLONASE ) 50 MCG/ACT nasal spray Place 1 spray daily at bedtime in each nostril   fluticasone  (FLONASE ) 50 MCG/ACT nasal spray Place 1 spray in each nostril daily at bedtime   fluticasone  (FLONASE ) 50 MCG/ACT nasal spray Place 1 spray into both nostrils at bedtime.   furosemide  (LASIX ) 20 MG tablet Take 1 tablet (20 mg total) by mouth daily.   gabapentin  (NEURONTIN ) 800 MG tablet TAKE 1 TABLET BY MOUTH EVERY 8 HOURS   glipiZIDE  (GLUCOTROL ) 5 MG tablet Take 1 tablet (5 mg total) by mouth daily. (Patient not taking: Reported on 08/23/2023)   glipiZIDE  (GLUCOTROL ) 5 MG tablet Take 1 tablet (5 mg total) by mouth daily.   HYDROcodone -acetaminophen  (NORCO) 7.5-325 MG tablet Take 1 tablet by mouth every 6 (six) hours as needed. (Patient not taking: Reported on 08/23/2023)   hydrOXYzine  (VISTARIL ) 50 MG capsule Take 1 capsule (50 mg total) by mouth 4 (four) times daily as needed for anxiety or nausea. (Patient not taking: Reported on 08/23/2023)   ibuprofen  (ADVIL ) 800 MG tablet Take 1 tablet (800 mg total) by mouth 3 (three) times daily with meals as needed. (Patient not taking: Reported on 08/23/2023)   ibuprofen  (ADVIL ) 800  MG tablet Take 1 tablet (800 mg total) by mouth every 8 (eight) hours as needed. (Patient not taking: Reported on 08/23/2023)   latanoprost  (XALATAN ) 0.005 % ophthalmic solution Instill 1 drop in eyes daily as directed   latanoprost  (XALATAN ) 0.005 % ophthalmic solution Place 1 drop in both eyes daily   latanoprost  (XALATAN ) 0.005 % ophthalmic solution Instill 1 Drop in eye(s) daily   latanoprost  (XALATAN ) 0.005 % ophthalmic solution Place 1 drop in both eyes daily.   latanoprost  (XALATAN ) 0.005 % ophthalmic solution Place 1 drop into both eyes daily.   latanoprost  (XALATAN ) 0.005 % ophthalmic solution Place 1 drop into eye(s) daily.   meloxicam  (MOBIC ) 15 MG tablet Take 1 tablet (15 mg total) by mouth daily. (Patient not taking: Reported on 08/23/2023)   metroNIDAZOLE  (METROGEL ) 0.75 % vaginal gel Insert one applicatorful into vagina once daily for 7 days as directed. (Patient not taking: Reported on 08/23/2023)   mirtazapine  (REMERON ) 15 MG tablet Take 1 tablet by mouth at bedtime for major depressive disorder (Patient not taking: Reported on 08/23/2023)   omeprazole  (PRILOSEC) 20 MG capsule Take 1 capsule (20 mg total) by mouth daily.   omeprazole  (PRILOSEC) 20 MG capsule Take 1 capsule by mouth daily.   omeprazole  (PRILOSEC) 20 MG capsule Take 1 capsule by mouth daily.   omeprazole  (PRILOSEC) 20 MG capsule Take 1 capsule by mouth daily   omeprazole  (PRILOSEC) 20 MG capsule Take 1 capsule (20 mg total) by mouth daily.   omeprazole  (PRILOSEC) 40 MG capsule Take 1 capsule (40 mg total) by mouth daily.   omeprazole  (PRILOSEC) 40 MG capsule Take 1 capsule (40 mg total) by mouth daily.   omeprazole  (PRILOSEC) 40 MG capsule Take 1 capsule (40 mg total) by mouth daily.   omeprazole  (PRILOSEC) 40 MG capsule Take 1 capsule (40 mg total) by mouth daily.   rosuvastatin  (CRESTOR ) 5 MG tablet Take 1 tablet (5 mg total) by mouth daily.   rosuvastatin  (CRESTOR ) 5 MG tablet Take 1 tablet by mouth daily.    rosuvastatin  (CRESTOR ) 5 MG tablet Take 1 tablet (5 mg total) by mouth daily.   rosuvastatin  (CRESTOR ) 5 MG tablet Take 1 tablet (5 mg total) by mouth daily.   rosuvastatin  (CRESTOR )  5 MG tablet Take 1 tablet (5 mg total) by mouth daily.   rosuvastatin  (CRESTOR ) 5 MG tablet Take 1 tablet (5 mg total) by mouth daily.   rosuvastatin  (CRESTOR ) 5 MG tablet Take 1 tablet (5 mg total) by mouth daily.   No facility-administered encounter medications on file as of 08/23/2023.    Allergies (verified) Clindamycin/lincomycin, Other, Hydrocodone , and Morphine   History: Past Medical History:  Diagnosis Date   Allergy    Anemia    Anxiety    Arthritis    Asthma    Chronic lower back pain    Depression    Diabetes mellitus without complication (HCC)    Dyspnea    GERD (gastroesophageal reflux disease)    Glaucoma    Hyperlipemia    Hypertension    IIH (idiopathic intracranial hypertension)    Pseudotumor cerebri 08/13/2019   Substance abuse (HCC)    Rx drugs for pain medication. Has not had in 5 years.   Past Surgical History:  Procedure Laterality Date   CESAREAN SECTION     x 3   CYSTOSCOPY Bilateral 11/25/2019   Procedure: CYSTOSCOPY;  Surgeon: Corene Coy, MD;  Location: [facility] [place];  Service: Gynecology;  Laterality: Bilateral;   DERMOID CYST  EXCISION     fluid removed from brain  12/20/2018   ROBOTIC ASSISTED TOTAL HYSTERECTOMY Bilateral 11/25/2019   Procedure: XI ROBOTIC ASSISTED TOTAL HYSTERECTOMY WITH SALPINGECTOMY;  Surgeon: Corene Coy, MD;  Location: [facility] [place];  Service: Gynecology;  Laterality: Bilateral;   Family History  Problem Relation Age of Onset   Diabetes Mother    Hypertension Mother    Cancer Father    Liver cancer Father    Colon cancer Neg Hx    Esophageal cancer Neg Hx    Rectal cancer Neg Hx    Stomach cancer Neg Hx    Pancreatic cancer Neg Hx    Social History   Socioeconomic History   Marital  status: Legally Separated    Spouse name: Not on file   Number of children: Not on file   Years of education: Not on file   Highest education level: Not on file  Occupational History   Not on file  Tobacco Use   Smoking status: Every Day    Current packs/day: 0.25    Types: Cigarettes   Smokeless tobacco: Never  Vaping Use   Vaping status: Never Used  Substance and Sexual Activity   Alcohol use: Yes    Comment: occasional   Drug use: No   Sexual activity: Yes    Birth control/protection: Condom  Other Topics Concern   Not on file  Social History Narrative   Not on file   Social Drivers of Health   Financial Resource Strain: Low Risk  (08/23/2023)   Overall Financial Resource Strain (CARDIA)    Difficulty of Paying Living Expenses: Not very hard  Food Insecurity: No Food Insecurity (08/23/2023)   Hunger Vital Sign    Worried About Running Out of Food in the Last Year: Never true    Ran Out of Food in the Last Year: Never true  Transportation Needs: No Transportation Needs (08/23/2023)   PRAPARE - Administrator, Civil Service (Medical): No    Lack of Transportation (Non-Medical): No  Physical Activity: Insufficiently Active (08/23/2023)   Exercise Vital Sign    Days of Exercise per Week: 1 day    Minutes of Exercise per Session: 20 min  Stress: Stress Concern Present (08/23/2023)   [employer] of Occupational Health - Occupational Stress Questionnaire    Feeling of Stress : To some extent  Social Connections: Socially Isolated (08/23/2023)   Social Connection and Isolation Panel [NHANES]    Frequency of Communication with Friends and Family: More than three times a week    Frequency of Social Gatherings with Friends and Family: More than three times a week    Attends Religious Services: Never    Database Administrator or Organizations: No    Attends Engineer, Structural: Never    Marital Status: Divorced    Tobacco Counseling Ready to quit: Not  Answered Counseling given: Not Answered   Clinical Intake:  Pre-visit preparation completed: Yes  Pain : No/denies pain     BMI - recorded: 32.37 Nutritional Status: BMI > 30  Obese Nutritional Risks: None Diabetes: No  How often do you need to have someone help you when you read instructions, pamphlets, or other written materials from your doctor or pharmacy?: 1 - Never  Interpreter Needed?: No      Activities of Daily Living    08/23/2023   10:51 AM  In your present state of health, do you have any difficulty performing the following activities:  Hearing? 0  Vision? 1  Difficulty concentrating or making decisions? 1  Walking or climbing stairs? 1  Dressing or bathing? 0  Doing errands, shopping? 0  Preparing Food and eating ? N  Using the Toilet? N  In the past six months, have you accidently leaked urine? N  Do you have problems with loss of bowel control? N  Managing your Medications? N  Managing your Finances? N  Housekeeping or managing your Housekeeping? N    Patient Care Team: Oley Bascom RAMAN, NP as PCP - General (Pulmonary Disease) Cleatus Collar, MD as Consulting Physician (Ophthalmology)  Indicate any recent Medical Services you may have received from other than [facility] providers in the past year (date may be approximate).     Assessment:   This is a routine wellness examination for Sanjna.  Hearing/Vision screen No results found.   Goals Addressed             This Visit's Progress    DIET - DECREASE SODA OR JUICE INTAKE       DIET - REDUCE FAT INTAKE   On track      Depression Screen    08/23/2023   11:13 AM 08/23/2023   11:07 AM 09/08/2021    9:28 AM 07/25/2021    3:25 PM 06/20/2021    2:54 PM 01/11/2021    8:39 AM 06/17/2020    9:30 AM  PHQ 2/9 Scores  PHQ - 2 Score 0 0 3 2 0 2 0  PHQ- 9 Score   24 3  10    Exception Documentation       Medical reason    Fall Risk    08/23/2023   11:10 AM 09/08/2021    9:28 AM 07/25/2021    3:25  PM 06/20/2021    2:54 PM 01/11/2021    8:38 AM  Fall Risk   Falls in the past year? 1 0 0 1 1  Number falls in past yr: 0 0 0 0 1  Injury with Fall? 1 0 0 0 1  Risk for fall due to : Impaired mobility    Impaired balance/gait  Follow up Falls evaluation completed    Education provided;Falls prevention discussed  MEDICARE RISK AT HOME: Medicare Risk at Home Any stairs in or around the home?: No If so, are there any without handrails?: No Home free of loose throw rugs in walkways, pet beds, electrical cords, etc?: Yes Adequate lighting in your home to reduce risk of falls?: Yes Life alert?: No Use of a cane, walker or w/c?: No Grab bars in the bathroom?: No Shower chair or bench in shower?: No Elevated toilet seat or a handicapped toilet?: No  TIMED UP AND GO:  Was the test performed?  No    Cognitive Function:    01/11/2021    8:44 AM  MMSE - Mini Mental State Exam  Orientation to time 5  Orientation to Place 5  Registration 3  Attention/ Calculation 5  Recall 3  Language- name 2 objects 2  Language- repeat 1  Language- follow 3 step command 3  Language- read & follow direction 1  Write a sentence 1  Copy design 1  Total score 30        08/23/2023   11:13 AM  6CIT Screen  What Year? 0 points  What month? 0 points  What time? 0 points  Count back from 20 0 points  Months in reverse 2 points  Repeat phrase 0 points  Total Score 2 points    Immunizations Immunization History  Administered Date(s) Administered   Tdap 10/06/2019    TDAP status: Up to date  Flu Vaccine status: Declined, Education has been provided regarding the importance of this vaccine but patient still declined. Advised may receive this vaccine at local pharmacy or Health Dept. Aware to provide a copy of the vaccination record if obtained from local pharmacy or Health Dept. Verbalized acceptance and understanding.  Pneumococcal vaccine status: Completed during today's visit.  Covid-19  vaccine status: Declined, Education has been provided regarding the importance of this vaccine but patient still declined. Advised may receive this vaccine at local pharmacy or Health Dept.or vaccine clinic. Aware to provide a copy of the vaccination record if obtained from local pharmacy or Health Dept. Verbalized acceptance and understanding.  Qualifies for Shingles Vaccine? No   Zostavax completed No   Shingrix Completed?: No.    Education has been provided regarding the importance of this vaccine. Patient has been advised to call insurance company to determine out of pocket expense if they have not yet received this vaccine. Advised may also receive vaccine at local pharmacy or Health Dept. Verbalized acceptance and understanding.  Screening Tests Health Maintenance  Topic Date Due   COVID-19 Vaccine (1) Never done   INFLUENZA VACCINE  Never done   Medicare Annual Wellness (AWV)  08/22/2024   DTaP/Tdap/Td (2 - Td or Tdap) 10/05/2029   Colonoscopy  04/05/2030   Hepatitis C Screening  Completed   HIV Screening  Completed   HPV VACCINES  Aged Out    Health Maintenance  Health Maintenance Due  Topic Date Due   COVID-19 Vaccine (1) Never done   INFLUENZA VACCINE  Never done    Colorectal cancer screening: Type of screening: Colonoscopy. Completed 04/05/2030. Repeat every 10 years    Lung Cancer Screening: (Low Dose CT Chest recommended if Age 40-80 years, 20 pack-year currently smoking OR have quit w/in 15years.) does not qualify.   Lung Cancer Screening Referral: N/A  Additional Screening:  Hepatitis C Screening: does not qualify; Completed 09/15/2019  Vision Screening: Recommended annual ophthalmology exams for early detection of glaucoma and other disorders of the eye. Is the patient  up to date with their annual eye exam?  No  Who is the provider or what is the name of the office in which the patient attends annual eye exams? N/A If pt is not established with a provider,  would they like to be referred to a provider to establish care? Yes .   Dental Screening: Recommended annual dental exams for proper oral hygiene  Diabetic Foot Exam: Diabetic Foot Exam: Overdue, Pt has been advised about the importance in completing this exam. Pt is scheduled for diabetic foot exam on Referral was placed.  Community Resource Referral / Chronic Care Management: CRR required this visit?  Yes   CCM required this visit?  Appt scheduled with PCP     Plan:     I have personally reviewed and noted the following in the patient's chart:   Medical and social history Use of alcohol, tobacco or illicit drugs  Current medications and supplements including opioid prescriptions. Patient is not currently taking opioid prescriptions. Functional ability and status Nutritional status Physical activity Advanced directives List of other physicians Hospitalizations, surgeries, and ER visits in previous 12 months Vitals Screenings to include cognitive, depression, and falls Referrals and appointments  In addition, I have reviewed and discussed with patient certain preventive protocols, quality metrics, and best practice recommendations. A written personalized care plan for preventive services as well as general preventive health recommendations were provided to patient.     Suzen Shove, RMA   08/23/2023   After Visit Summary: (Mail) Due to this being a telephonic visit, the after visit summary with patients personalized plan was offered to patient via mail   Nurse Notes: N/A

## 2023-08-23 NOTE — Progress Notes (Signed)
 Complex Care Management Note Care Guide Note  08/23/2023 Name: Leah Barber MRN: [id number] DOB: Oct 23, 1975   Complex Care Management Outreach Attempts: An unsuccessful telephone outreach was attempted today to offer the patient information about available complex care management services.  Follow Up Plan:  Additional outreach attempts will be made to offer the patient complex care management information and services.   Encounter Outcome:  No Answer  Harlene Satterfield  Care Coordination Care Guide  Direct Dial: [phone]

## 2023-08-24 ENCOUNTER — Other Ambulatory Visit (HOSPITAL_COMMUNITY): Payer: Self-pay

## 2023-08-24 MED ORDER — AMLODIPINE BESYLATE 10 MG PO TABS
10.0000 mg | ORAL_TABLET | Freq: Every day | ORAL | 3 refills | Status: DC
  Filled 2023-08-24: qty 90, 90d supply, fill #0
  Filled 2023-11-21: qty 90, 90d supply, fill #1
  Filled 2024-02-23: qty 90, 90d supply, fill #2
  Filled 2024-06-09: qty 90, 90d supply, fill #3

## 2023-08-25 ENCOUNTER — Other Ambulatory Visit (HOSPITAL_COMMUNITY): Payer: Self-pay

## 2023-08-26 ENCOUNTER — Other Ambulatory Visit (HOSPITAL_COMMUNITY): Payer: Self-pay

## 2023-08-26 MED ORDER — DULOXETINE HCL 60 MG PO CPEP
60.0000 mg | ORAL_CAPSULE | Freq: Two times a day (BID) | ORAL | 2 refills | Status: DC
  Filled 2023-08-26: qty 90, 45d supply, fill #0
  Filled 2023-10-09: qty 90, 45d supply, fill #1

## 2023-08-27 ENCOUNTER — Other Ambulatory Visit (HOSPITAL_COMMUNITY): Payer: Self-pay

## 2023-08-29 NOTE — Progress Notes (Signed)
 Complex Care Management Note Care Guide Note  08/29/2023 Name: DELMAR DONDERO MRN: [id number] DOB: [birth year]   Complex Care Management Outreach Attempts: A second unsuccessful outreach was attempted today to offer the patient with information about available complex care management services.  Follow Up Plan:  Additional outreach attempts will be made to offer the patient complex care management information and services.   Encounter Outcome:  No Answer  Harlene Satterfield  Care Coordination Care Guide  Direct Dial: [phone]

## 2023-08-30 NOTE — Progress Notes (Signed)
 Complex Care Management Note Care Guide Note  08/30/2023 Name: SHY GUALLPA MRN: [id number] DOB: 01/31/1976   Complex Care Management Outreach Attempts: A third unsuccessful outreach was attempted today to offer the patient with information about available complex care management services.  Follow Up Plan:  No further outreach attempts will be made at this time. We have been unable to contact the patient to offer or enroll patient in complex care management services.  Encounter Outcome:  No Answer  Harlene Satterfield  Care Coordination Care Guide  Direct Dial: [phone]

## 2023-09-04 ENCOUNTER — Other Ambulatory Visit (HOSPITAL_COMMUNITY): Payer: Self-pay

## 2023-09-04 MED ORDER — PANTOPRAZOLE SODIUM 20 MG PO TBEC
DELAYED_RELEASE_TABLET | ORAL | 0 refills | Status: DC
  Filled 2023-09-04: qty 90, 90d supply, fill #0

## 2023-09-05 ENCOUNTER — Other Ambulatory Visit: Payer: Self-pay | Admitting: Family Medicine

## 2023-09-05 DIAGNOSIS — Z0000 Encounter for general adult medical examination without abnormal findings: Secondary | ICD-10-CM

## 2023-09-05 DIAGNOSIS — R222 Localized swelling, mass and lump, trunk: Secondary | ICD-10-CM

## 2023-09-05 LAB — LAB REPORT - SCANNED
A1c: 5.7
Albumin, Urine POC: 0.5
Creatinine, POC: 70 mg/dL
EGFR: 99
Microalb Creat Ratio: 7

## 2023-09-06 ENCOUNTER — Encounter: Payer: Self-pay | Admitting: Family Medicine

## 2023-09-11 ENCOUNTER — Other Ambulatory Visit: Payer: 59

## 2023-09-12 ENCOUNTER — Ambulatory Visit: Payer: Self-pay | Admitting: Nurse Practitioner

## 2023-09-17 ENCOUNTER — Other Ambulatory Visit: Payer: 59

## 2023-09-25 ENCOUNTER — Other Ambulatory Visit: Payer: Self-pay

## 2023-10-01 ENCOUNTER — Other Ambulatory Visit: Payer: Self-pay

## 2023-10-01 ENCOUNTER — Other Ambulatory Visit (HOSPITAL_COMMUNITY): Payer: Self-pay

## 2023-10-01 MED ORDER — CLONIDINE HCL 0.1 MG PO TABS
0.1000 mg | ORAL_TABLET | Freq: Three times a day (TID) | ORAL | 3 refills | Status: DC
  Filled 2023-10-01: qty 90, 30d supply, fill #0
  Filled 2023-11-02: qty 90, 30d supply, fill #1
  Filled 2023-12-10: qty 90, 30d supply, fill #2
  Filled 2024-01-08: qty 90, 30d supply, fill #3

## 2023-10-02 ENCOUNTER — Other Ambulatory Visit: Payer: 59

## 2023-10-05 ENCOUNTER — Other Ambulatory Visit (HOSPITAL_COMMUNITY): Payer: Self-pay | Admitting: Family Medicine

## 2023-10-05 ENCOUNTER — Other Ambulatory Visit (HOSPITAL_COMMUNITY): Payer: Self-pay

## 2023-10-09 ENCOUNTER — Other Ambulatory Visit (HOSPITAL_COMMUNITY): Payer: Self-pay

## 2023-10-15 ENCOUNTER — Other Ambulatory Visit (HOSPITAL_COMMUNITY): Payer: Self-pay

## 2023-10-15 ENCOUNTER — Other Ambulatory Visit: Payer: Self-pay | Admitting: Registered Nurse

## 2023-10-15 DIAGNOSIS — M5136 Other intervertebral disc degeneration, lumbar region with discogenic back pain only: Secondary | ICD-10-CM

## 2023-10-15 MED ORDER — LATANOPROST 0.005 % OP SOLN
1.0000 [drp] | Freq: Every evening | OPHTHALMIC | 3 refills | Status: AC
Start: 2023-10-15 — End: ?
  Filled 2023-10-15: qty 5, 50d supply, fill #0
  Filled 2024-02-04: qty 5, 50d supply, fill #1
  Filled 2024-04-24: qty 5, 50d supply, fill #2
  Filled 2024-06-09: qty 5, 50d supply, fill #3

## 2023-10-17 ENCOUNTER — Other Ambulatory Visit (HOSPITAL_COMMUNITY): Payer: Self-pay

## 2023-10-18 ENCOUNTER — Ambulatory Visit
Admission: RE | Admit: 2023-10-18 | Discharge: 2023-10-18 | Disposition: A | Discharge status: Home / Self Care | Payer: 59 | Source: Ambulatory Visit | Attending: Registered Nurse | Admitting: Registered Nurse

## 2023-10-18 DIAGNOSIS — M5136 Other intervertebral disc degeneration, lumbar region with discogenic back pain only: Secondary | ICD-10-CM

## 2023-10-23 ENCOUNTER — Ambulatory Visit: Payer: 59 | Admitting: Podiatry

## 2023-10-23 ENCOUNTER — Other Ambulatory Visit (HOSPITAL_COMMUNITY): Payer: Self-pay

## 2023-10-29 ENCOUNTER — Ambulatory Visit (INDEPENDENT_AMBULATORY_CARE_PROVIDER_SITE_OTHER): Admitting: Podiatry

## 2023-10-29 ENCOUNTER — Other Ambulatory Visit (HOSPITAL_COMMUNITY): Payer: Self-pay

## 2023-10-29 ENCOUNTER — Ambulatory Visit (INDEPENDENT_AMBULATORY_CARE_PROVIDER_SITE_OTHER)

## 2023-10-29 DIAGNOSIS — M79671 Pain in right foot: Secondary | ICD-10-CM

## 2023-10-29 DIAGNOSIS — M722 Plantar fascial fibromatosis: Secondary | ICD-10-CM

## 2023-10-29 MED ORDER — MELOXICAM 15 MG PO TABS
15.0000 mg | ORAL_TABLET | Freq: Every day | ORAL | 0 refills | Status: DC
  Filled 2023-10-29: qty 30, 30d supply, fill #0

## 2023-10-29 MED ORDER — TRIAMCINOLONE ACETONIDE 10 MG/ML IJ SUSP
2.5000 mg | Freq: Once | INTRAMUSCULAR | Status: AC
  Administered 2023-10-29: 2.5 mg via INTRA_ARTICULAR

## 2023-10-29 MED ORDER — OMEPRAZOLE 40 MG PO CPDR
40.0000 mg | DELAYED_RELEASE_CAPSULE | Freq: Every day | ORAL | 3 refills | Status: AC
  Filled 2023-10-29: qty 90, 90d supply, fill #0
  Filled 2024-01-22: qty 90, 90d supply, fill #1
  Filled 2024-04-24: qty 90, 90d supply, fill #2
  Filled 2024-07-31 – 2024-08-18 (×2): qty 90, 90d supply, fill #3

## 2023-10-29 MED ORDER — DEXAMETHASONE SODIUM PHOSPHATE 120 MG/30ML IJ SOLN
4.0000 mg | Freq: Once | INTRAMUSCULAR | Status: AC
  Administered 2023-10-29: 4 mg via INTRA_ARTICULAR

## 2023-10-29 NOTE — Patient Instructions (Signed)

## 2023-10-29 NOTE — Progress Notes (Signed)
  Subjective:  Patient ID: Leah Barber, female    DOB: [birth year],   MRN: [id number]  No chief complaint on file.   48 y.o. female presents for concern of right heel pain that has been ongoing for several months. She relates first steps after rest are very painful. It hurts while walking but also hurts at rest. She has been taking tylenol and ibuprofen with little relief. She normally goes barefoot but has not been able to recently  . Denies any other pedal complaints. Patient is diabetic and last A1c was  Lab Results  Component Value Date   HGBA1C 6.6 (A) 03/18/2021   HGBA1C 6.6 03/18/2021   HGBA1C 6.6 (A) 03/18/2021   HGBA1C 6.6 03/18/2021   .   PCP:  Ellyn Hack, MD    Denies n/v/f/c.   Past Medical History:  Diagnosis Date   Allergy    Anemia    Anxiety    Arthritis    Asthma    Chronic lower back pain    Depression    Diabetes mellitus without complication (HCC)    Dyspnea    GERD (gastroesophageal reflux disease)    Glaucoma    Hyperlipemia    Hypertension    IIH (idiopathic intracranial hypertension)    Pseudotumor cerebri 08/13/2019   Substance abuse (HCC)    Rx drugs for pain medication. Has not had in 5 years.    Objective:  Physical Exam: Vascular: DP/PT pulses 2/4 bilateral. CFT <3 seconds. Normal hair growth on digits. No edema.  Skin. No lacerations or abrasions bilateral feet.  Musculoskeletal: MMT 5/5 bilateral lower extremities in DF, PF, Inversion and Eversion. Deceased ROM in DF of ankle joint. Tender to the medial calcaneal tubercle right . No pain with achilles, PT or arch. No pain with calcaneal squeeze.  Neurological: Sensation intact to light touch.   Assessment:   1. Plantar fasciitis, right      Plan:  Patient was evaluated and treated and all questions answered. Discussed plantar fasciitis with patient.  X-rays reviewed and discussed with patient. No acute fractures or dislocations noted. Mild spurring noted at inferior  calcaneus. Collapse of midfoot arch with degenerative changes noted throughout the midfoot and pes planus.  Discussed treatment options including, ice, NSAIDS, supportive shoes, bracing, and stretching. Stretching exercises provided to be done on a daily basis.   Prescription for meloxicam provided and sent to pharmacy. Most recent Cr WNL but is old. Will only do for 30 days.   PF brace dispensed Patient requesting injection today. Procedure note below.   Follow-up 6 weeks or sooner if any problems arise. In the meantime, encouraged to call the office with any questions, concerns, change in symptoms.   Procedure:  Discussed etiology, pathology, conservative vs. surgical therapies. At this time a plantar fascial injection was recommended.  The patient agreed and a sterile skin prep was applied.  An injection consisting of  1cc dexamethasone 0.5 cc kenalog and 1cc marcaine mixture was infiltrated at the point of maximal tenderness on the right Heel.  Bandaid applied. The patient tolerated this well and was given instructions for aftercare.    Louann Sjogren, DPM

## 2023-10-29 NOTE — Addendum Note (Signed)
 Addended by: Louann Sjogren R on: 10/29/2023 04:08 PM   Modules accepted: Level of Service

## 2023-11-02 ENCOUNTER — Other Ambulatory Visit (HOSPITAL_COMMUNITY): Payer: Self-pay

## 2023-11-03 ENCOUNTER — Other Ambulatory Visit (HOSPITAL_COMMUNITY): Payer: Self-pay

## 2023-11-07 ENCOUNTER — Other Ambulatory Visit (HOSPITAL_COMMUNITY): Payer: Self-pay

## 2023-11-07 MED ORDER — WEGOVY 0.25 MG/0.5ML ~~LOC~~ SOAJ
0.2500 mg | SUBCUTANEOUS | 0 refills | Status: DC
  Filled 2023-11-07 – 2024-01-08 (×3): qty 2, 28d supply, fill #0

## 2023-11-07 MED ORDER — WEGOVY 0.5 MG/0.5ML ~~LOC~~ SOAJ
0.5000 mg | SUBCUTANEOUS | 2 refills | Status: DC
  Filled 2023-11-21: qty 2, 28d supply, fill #0
  Filled 2023-12-17: qty 2, 28d supply, fill #1
  Filled 2024-01-30: qty 2, 28d supply, fill #2

## 2023-11-08 ENCOUNTER — Other Ambulatory Visit (HOSPITAL_COMMUNITY): Payer: Self-pay

## 2023-11-09 ENCOUNTER — Other Ambulatory Visit (HOSPITAL_COMMUNITY): Payer: Self-pay

## 2023-11-13 ENCOUNTER — Other Ambulatory Visit (HOSPITAL_COMMUNITY): Payer: Self-pay

## 2023-11-15 ENCOUNTER — Other Ambulatory Visit (HOSPITAL_COMMUNITY): Payer: Self-pay

## 2023-11-21 ENCOUNTER — Other Ambulatory Visit (HOSPITAL_COMMUNITY): Payer: Self-pay

## 2023-11-21 MED ORDER — METRONIDAZOLE 500 MG PO TABS
500.0000 mg | ORAL_TABLET | Freq: Two times a day (BID) | ORAL | 0 refills | Status: DC
  Filled 2023-11-21: qty 14, 7d supply, fill #0

## 2023-11-24 ENCOUNTER — Other Ambulatory Visit (HOSPITAL_COMMUNITY): Payer: Self-pay

## 2023-11-26 ENCOUNTER — Other Ambulatory Visit (HOSPITAL_COMMUNITY): Payer: Self-pay

## 2023-11-27 ENCOUNTER — Other Ambulatory Visit (HOSPITAL_COMMUNITY): Payer: Self-pay

## 2023-11-28 ENCOUNTER — Other Ambulatory Visit (HOSPITAL_COMMUNITY): Payer: Self-pay

## 2023-11-29 ENCOUNTER — Other Ambulatory Visit (HOSPITAL_COMMUNITY): Payer: Self-pay

## 2023-11-29 MED ORDER — ONDANSETRON HCL 4 MG PO TABS
4.0000 mg | ORAL_TABLET | Freq: Two times a day (BID) | ORAL | 0 refills | Status: DC | PRN
  Filled 2023-11-29: qty 60, 30d supply, fill #0

## 2023-12-03 ENCOUNTER — Other Ambulatory Visit (HOSPITAL_COMMUNITY): Payer: Self-pay

## 2023-12-04 ENCOUNTER — Other Ambulatory Visit (HOSPITAL_COMMUNITY): Payer: Self-pay

## 2023-12-04 MED ORDER — LACTULOSE 20 GM/30ML PO SOLN
30.0000 mL | Freq: Every day | ORAL | 0 refills | Status: DC
  Filled 2023-12-04: qty 473, 15d supply, fill #0

## 2023-12-10 ENCOUNTER — Ambulatory Visit (INDEPENDENT_AMBULATORY_CARE_PROVIDER_SITE_OTHER): Admitting: Podiatry

## 2023-12-10 ENCOUNTER — Other Ambulatory Visit (HOSPITAL_COMMUNITY): Payer: Self-pay

## 2023-12-10 DIAGNOSIS — Z91199 Patient's noncompliance with other medical treatment and regimen due to unspecified reason: Secondary | ICD-10-CM

## 2023-12-10 NOTE — Progress Notes (Signed)
 No show

## 2023-12-12 ENCOUNTER — Other Ambulatory Visit: Payer: Self-pay

## 2023-12-12 ENCOUNTER — Other Ambulatory Visit (HOSPITAL_COMMUNITY): Payer: Self-pay

## 2023-12-13 ENCOUNTER — Other Ambulatory Visit (HOSPITAL_COMMUNITY): Payer: Self-pay

## 2023-12-17 ENCOUNTER — Other Ambulatory Visit (HOSPITAL_COMMUNITY): Payer: Self-pay

## 2024-01-08 ENCOUNTER — Other Ambulatory Visit (HOSPITAL_COMMUNITY): Payer: Self-pay

## 2024-01-08 MED ORDER — DULOXETINE HCL 60 MG PO CPEP
60.0000 mg | ORAL_CAPSULE | Freq: Two times a day (BID) | ORAL | 3 refills | Status: AC
Start: 2024-01-08 — End: ?
  Filled 2024-01-08: qty 180, 90d supply, fill #0
  Filled 2024-04-10: qty 180, 90d supply, fill #1
  Filled 2024-07-10: qty 180, 90d supply, fill #2

## 2024-01-11 ENCOUNTER — Other Ambulatory Visit (HOSPITAL_COMMUNITY): Payer: Self-pay

## 2024-01-11 ENCOUNTER — Other Ambulatory Visit (HOSPITAL_BASED_OUTPATIENT_CLINIC_OR_DEPARTMENT_OTHER): Payer: Self-pay

## 2024-01-11 MED ORDER — BUPRENORPHINE HCL-NALOXONE HCL 8-2 MG SL FILM
1.0000 | ORAL_FILM | Freq: Three times a day (TID) | SUBLINGUAL | 0 refills | Status: DC
  Filled 2024-01-11: qty 18, 6d supply, fill #0

## 2024-01-17 ENCOUNTER — Other Ambulatory Visit (HOSPITAL_COMMUNITY): Payer: Self-pay

## 2024-01-17 MED ORDER — BUPRENORPHINE HCL-NALOXONE HCL 8-2 MG SL FILM
ORAL_FILM | SUBLINGUAL | 0 refills | Status: AC
  Filled 2024-01-17: qty 21, 7d supply, fill #0

## 2024-01-22 ENCOUNTER — Other Ambulatory Visit (HOSPITAL_COMMUNITY): Payer: Self-pay

## 2024-01-22 MED ORDER — FUROSEMIDE 20 MG PO TABS
20.0000 mg | ORAL_TABLET | Freq: Every day | ORAL | 3 refills | Status: AC
  Filled 2024-01-22: qty 90, 90d supply, fill #0
  Filled 2024-04-24: qty 90, 90d supply, fill #1
  Filled 2024-07-31 – 2024-08-18 (×2): qty 90, 90d supply, fill #2

## 2024-01-22 MED ORDER — DAPAGLIFLOZIN PROPANEDIOL 10 MG PO TABS
10.0000 mg | ORAL_TABLET | Freq: Every morning | ORAL | 3 refills | Status: AC
  Filled 2024-01-22: qty 90, 90d supply, fill #0

## 2024-01-30 ENCOUNTER — Other Ambulatory Visit: Payer: Self-pay

## 2024-01-30 ENCOUNTER — Other Ambulatory Visit (HOSPITAL_COMMUNITY): Payer: Self-pay

## 2024-01-30 MED ORDER — ROSUVASTATIN CALCIUM 10 MG PO TABS
10.0000 mg | ORAL_TABLET | Freq: Every day | ORAL | 3 refills | Status: AC
  Filled 2024-02-26: qty 90, 90d supply, fill #0
  Filled 2024-06-09: qty 90, 90d supply, fill #1
  Filled 2024-08-18: qty 90, 90d supply, fill #2

## 2024-01-30 MED ORDER — DAPAGLIFLOZIN PROPANEDIOL 10 MG PO TABS
ORAL_TABLET | ORAL | 3 refills | Status: AC
  Filled 2024-01-30 – 2024-03-31 (×2): qty 90, 90d supply, fill #0
  Filled 2024-08-18: qty 90, 90d supply, fill #1

## 2024-01-31 ENCOUNTER — Other Ambulatory Visit (HOSPITAL_COMMUNITY): Payer: Self-pay

## 2024-02-01 ENCOUNTER — Other Ambulatory Visit (HOSPITAL_COMMUNITY): Payer: Self-pay

## 2024-02-04 ENCOUNTER — Other Ambulatory Visit (HOSPITAL_COMMUNITY): Payer: Self-pay

## 2024-02-05 ENCOUNTER — Other Ambulatory Visit (HOSPITAL_COMMUNITY): Payer: Self-pay

## 2024-02-07 ENCOUNTER — Other Ambulatory Visit (HOSPITAL_COMMUNITY): Payer: Self-pay

## 2024-02-08 ENCOUNTER — Other Ambulatory Visit (HOSPITAL_COMMUNITY): Payer: Self-pay

## 2024-02-11 ENCOUNTER — Other Ambulatory Visit (HOSPITAL_COMMUNITY): Payer: Self-pay

## 2024-02-11 ENCOUNTER — Other Ambulatory Visit (HOSPITAL_COMMUNITY): Payer: Self-pay | Admitting: Family Medicine

## 2024-02-12 ENCOUNTER — Other Ambulatory Visit (HOSPITAL_BASED_OUTPATIENT_CLINIC_OR_DEPARTMENT_OTHER): Payer: Self-pay

## 2024-02-12 ENCOUNTER — Other Ambulatory Visit: Payer: Self-pay

## 2024-02-13 ENCOUNTER — Other Ambulatory Visit (HOSPITAL_COMMUNITY): Payer: Self-pay

## 2024-02-13 MED ORDER — CLONIDINE HCL 0.1 MG PO TABS
0.1000 mg | ORAL_TABLET | Freq: Every day | ORAL | 3 refills | Status: DC
  Filled 2024-02-13: qty 90, 90d supply, fill #0

## 2024-02-14 ENCOUNTER — Other Ambulatory Visit (HOSPITAL_COMMUNITY): Payer: Self-pay

## 2024-02-23 ENCOUNTER — Other Ambulatory Visit (HOSPITAL_COMMUNITY): Payer: Self-pay

## 2024-02-26 ENCOUNTER — Other Ambulatory Visit (HOSPITAL_COMMUNITY): Payer: Self-pay

## 2024-02-27 ENCOUNTER — Other Ambulatory Visit (HOSPITAL_COMMUNITY): Payer: Self-pay

## 2024-03-01 ENCOUNTER — Other Ambulatory Visit (HOSPITAL_COMMUNITY): Payer: Self-pay

## 2024-03-12 ENCOUNTER — Other Ambulatory Visit (HOSPITAL_COMMUNITY): Payer: Self-pay

## 2024-03-12 MED ORDER — CLONIDINE HCL 0.1 MG PO TABS
0.1000 mg | ORAL_TABLET | Freq: Three times a day (TID) | ORAL | 3 refills | Status: AC
  Filled 2024-03-12: qty 270, 90d supply, fill #0
  Filled 2024-06-09: qty 270, 90d supply, fill #1
  Filled 2024-09-22: qty 270, 90d supply, fill #2

## 2024-03-27 ENCOUNTER — Emergency Department (HOSPITAL_COMMUNITY)
Admission: EM | Admit: 2024-03-27 | Discharge: 2024-03-28 | Disposition: A | Discharge status: Home / Self Care | Attending: Emergency Medicine | Admitting: Emergency Medicine

## 2024-03-27 DIAGNOSIS — E860 Dehydration: Secondary | ICD-10-CM | POA: Diagnosis not present

## 2024-03-27 DIAGNOSIS — R4182 Altered mental status, unspecified: Secondary | ICD-10-CM | POA: Insufficient documentation

## 2024-03-27 DIAGNOSIS — F1910 Other psychoactive substance abuse, uncomplicated: Secondary | ICD-10-CM | POA: Diagnosis not present

## 2024-03-27 LAB — CBC WITH DIFFERENTIAL/PLATELET
Abs Immature Granulocytes: 0.03 K/uL (ref 0.00–0.07)
Basophils Absolute: 0.1 K/uL (ref 0.0–0.1)
Basophils Relative: 1 %
Eosinophils Absolute: 0.2 K/uL (ref 0.0–0.5)
Eosinophils Relative: 3 %
HCT: 54 % — ABNORMAL HIGH (ref 36.0–46.0)
Hemoglobin: 17 g/dL — ABNORMAL HIGH (ref 12.0–15.0)
Immature Granulocytes: 1 %
Lymphocytes Relative: 18 %
Lymphs Abs: 1.2 K/uL (ref 0.7–4.0)
MCH: 29.6 pg (ref 26.0–34.0)
MCHC: 31.5 g/dL (ref 30.0–36.0)
MCV: 94.1 fL (ref 80.0–100.0)
Monocytes Absolute: 0.4 K/uL (ref 0.1–1.0)
Monocytes Relative: 6 %
Neutro Abs: 4.7 K/uL (ref 1.7–7.7)
Neutrophils Relative %: 71 %
Platelets: 193 K/uL (ref 150–400)
RBC: 5.74 MIL/uL — ABNORMAL HIGH (ref 3.87–5.11)
RDW: 12.3 % (ref 11.5–15.5)
WBC: 6.5 K/uL (ref 4.0–10.5)
nRBC: 0 % (ref 0.0–0.2)

## 2024-03-27 LAB — COMPREHENSIVE METABOLIC PANEL WITH GFR
ALT: 28 U/L (ref 0–44)
AST: 33 U/L (ref 15–41)
Albumin: 3.9 g/dL (ref 3.5–5.0)
Alkaline Phosphatase: 114 U/L (ref 38–126)
Anion gap: 13 (ref 5–15)
BUN: 7 mg/dL (ref 6–20)
CO2: 24 mmol/L (ref 22–32)
Calcium: 9.5 mg/dL (ref 8.9–10.3)
Chloride: 106 mmol/L (ref 98–111)
Creatinine, Ser: 0.87 mg/dL (ref 0.44–1.00)
GFR, Estimated: >60 mL/min (ref >60)
Glucose, Bld: 101 mg/dL — ABNORMAL HIGH (ref 70–99)
Potassium: 3.1 mmol/L — ABNORMAL LOW (ref 3.5–5.1)
Sodium: 143 mmol/L (ref 135–145)
Total Bilirubin: 1.1 mg/dL (ref 0.0–1.2)
Total Protein: 7.1 g/dL (ref 6.5–8.1)

## 2024-03-27 LAB — SALICYLATE LEVEL: Salicylate Lvl: <7 mg/dL — ABNORMAL LOW (ref 7.0–30.0)

## 2024-03-27 LAB — ETHANOL: Alcohol, Ethyl (B): <15 mg/dL (ref <15)

## 2024-03-27 LAB — ACETAMINOPHEN LEVEL: Acetaminophen (Tylenol), Serum: <10 ug/mL — ABNORMAL LOW (ref 10–30)

## 2024-03-27 MED ORDER — OLANZAPINE 5 MG PO TBDP
10.0000 mg | ORAL_TABLET | Freq: Once | ORAL | Status: AC
  Administered 2024-03-27: 10 mg via ORAL
  Filled 2024-03-27: qty 2

## 2024-03-27 MED ORDER — LACTATED RINGERS IV BOLUS
1000.0000 mL | Freq: Once | INTRAVENOUS | Status: AC
  Administered 2024-03-27: 1000 mL via INTRAVENOUS

## 2024-03-27 NOTE — ED Provider Notes (Signed)
 [facility] AT [facility] Provider Note   CSN: [id number] Arrival date & time: 03/27/24  2118     Patient presents with: Altered Mental Status   Leah Barber is a 48 y.o. female with no segment past medical history who presents emergency department with concern for altered mental status secondary to drug ingestion.  EMS states that they were called to scene with concerned that the patient had had a cigarette that was laced with drugs.  EMS states that the patient initially admitted to taking Suboxone  as well as cocaine however became agitated requiring 5 mg Haldol and 5 mg of Versed  and route prior to arrival.  EMS states that prior to their arrival patient was given Narcan .  Patient with self-harming behaviors including hitting her head on ambulance walls therefore was placed in 4 point restraints.  On arrival patient would not respond to questions however was moving all extremities spontaneously.    HPI     Prior to Admission medications   Medication Sig Start Date End Date Taking? Authorizing Provider  Albuterol  Sulfate (PROAIR  RESPICLICK) 108 (90 Base) MCG/ACT AEPB Inhale 2 puffs into the lungs 4 (four) times daily as needed. Patient not taking: Reported on 08/23/2023 05/17/20 05/17/21  Myrna Camelia HERO, NP  amLODipine  (NORVASC ) 10 MG tablet Take 1 tablet (10 mg total) by mouth daily. Patient not taking: Reported on 08/23/2023 10/04/22     amLODipine  (NORVASC ) 10 MG tablet Take 1 tablet (10 mg total) by mouth daily. Patient not taking: Reported on 08/23/2023 10/06/22     amLODipine  (NORVASC ) 10 MG tablet Take 1 tablet (10 mg) by mouth daily for blood pressure. 01/31/23     amLODipine  (NORVASC ) 10 MG tablet Take 1 tablet (10 mg total) by mouth daily. 08/24/23     amoxicillin  (AMOXIL ) 500 MG capsule Take 1 capsule (500 mg total) by mouth 3 (three) times daily. Patient not taking: Reported on 08/23/2023 06/06/23     Buprenorphine  HCl-Naloxone  HCl (SUBOXONE ) 8-2 MG FILM  Place 1/2 film under the tongue 4 (four) times daily. 03/07/23     Buprenorphine  HCl-Naloxone  HCl 8-2 MG FILM Dissolve 1 film under tongue three times daily 10/06/22     Buprenorphine  HCl-Naloxone  HCl 8-2 MG FILM Place 1 film under tongue three times a day 01/23/24     buprenorphine -naloxone  (SUBOXONE ) 8-2 mg SUBL SL tablet Place 1 tablet under the tongue 3 times daily as directed 02/16/22     buPROPion  (WELLBUTRIN  SR) 150 MG 12 hr tablet Take 1 tablet (150 mg total) by mouth daily for 3 days then take 1 tablet 2 (two) times daily. Patient not taking: Reported on 08/23/2023 09/05/22     buPROPion  (WELLBUTRIN  SR) 150 MG 12 hr tablet Take 1 tablet (150 mg total) by mouth 2 (two) times daily. Patient not taking: Reported on 08/23/2023 10/04/22     buPROPion  (WELLBUTRIN  SR) 150 MG 12 hr tablet Take 1 tablet (150 mg total) by mouth 2 (two) times daily. Patient not taking: Reported on 08/23/2023 10/06/22     cariprazine  (VRAYLAR ) 1.5 MG capsule Take 1 capsule by mouth daily Patient not taking: Reported on 08/23/2023 03/07/23     cetirizine  (ZYRTEC ) 10 MG tablet TAKE 1 TABLET BY MOUTH EVERY DAY 04/11/21   Myrna Camelia HERO, NP  chlorhexidine  (PERIDEX ) 0.12 % solution Rinse mouth with 15 mls (1 capful) for 30 seconds twice daily after toothbrushing. Expectorate after rinsing. DO NOT SWALLOW 06/06/23     cloNIDine  (CATAPRES ) 0.1  MG tablet Take 1 tablet (0.1 mg total) by mouth 3 (three) times daily. 09/05/22     cloNIDine  (CATAPRES ) 0.1 MG tablet Take 1 tablet (0.1 mg) by mouth 3 times daily. 10/04/22     cloNIDine  (CATAPRES ) 0.1 MG tablet Take 1 tablet (0.1 mg total) by mouth 3 (three) times daily. 10/06/22     cloNIDine  (CATAPRES ) 0.1 MG tablet Take 1 tablet (0.1 mg total) by mouth 3 (three) times daily. 10/01/23     cloNIDine  (CATAPRES ) 0.1 MG tablet Take 1 tablet (0.1 mg total) by mouth 3 (three) times daily. 03/12/24     CONCERTA  18 MG CR tablet Take 18 mg by mouth daily as needed. Patient not taking: Reported on 08/23/2023  05/17/20   [provider]  dapagliflozin  propanediol (FARXIGA ) 10 MG TABS tablet Take 1 tablet (10 mg total) by mouth daily. 02/04/22     dapagliflozin  propanediol (FARXIGA ) 10 MG TABS tablet Take 1 tablet by mouth daily 03/05/22     dapagliflozin  propanediol (FARXIGA ) 10 MG TABS tablet Take 1 tablet (10 mg total) by mouth daily. 05/28/22     dapagliflozin  propanediol (FARXIGA ) 10 MG TABS tablet Take 1 tablet (10 mg total) by mouth daily. 09/05/22     dapagliflozin  propanediol (FARXIGA ) 10 MG TABS tablet Take 1 tablet (10 mg total) by mouth daily. 10/04/22     dapagliflozin  propanediol (FARXIGA ) 10 MG TABS tablet Take 1 tablet (10 mg total) by mouth daily. 10/06/22     dapagliflozin  propanediol (FARXIGA ) 10 MG TABS tablet Take 1 tablet (10 mg) by mouth every morning. 01/22/24     dapagliflozin  propanediol (FARXIGA ) 10 MG TABS tablet Take 1 tablet (10 mg) by mouth every morning. 01/30/24     dapagliflozin  propanediol (FARXIGA ) 5 MG TABS tablet Take 1 tablet by mouth daily 01/05/22     doxycycline  (VIBRAMYCIN ) 100 MG capsule Take 1 capsule (100 mg total) by mouth 2 (two) times daily. 08/28/22   Beverley Leita LABOR, PA-C  DULoxetine  (CYMBALTA ) 30 MG capsule Take 1 capsule by mouth 2 times a day 01/05/22     DULoxetine  (CYMBALTA ) 30 MG capsule Take 1 capsule by mouth 2 times daily. 02/27/22     DULoxetine  (CYMBALTA ) 30 MG capsule Take 1 capsule by mouth 2 times daily. 02/27/22     DULoxetine  (CYMBALTA ) 60 MG capsule Take 1 capsule by mouth 2 times per day 02/27/23     DULoxetine  (CYMBALTA ) 60 MG capsule Take 1 capsule (60 mg total) by mouth 2 (two) times daily for depression 01/08/24     fluconazole  (DIFLUCAN ) 200 MG tablet Take 1 tablet (200 mg total) by mouth daily for yeast for 3 days. 05/15/23   Joshua Harlene CROME, NP  fluticasone  (FLONASE ) 50 MCG/ACT nasal spray Place 2 sprays into both nostrils daily. 07/25/21   Myrna Camelia HERO, NP  fluticasone  (FLONASE ) 50 MCG/ACT nasal spray Use 1 spray in each nostril daily at  bedtime 03/05/22     fluticasone  (FLONASE ) 50 MCG/ACT nasal spray Spray one spray into each nostril at bedtime. 04/04/22     fluticasone  (FLONASE ) 50 MCG/ACT nasal spray Place 1 spray daily at bedtime in each nostril 04/12/22     fluticasone  (FLONASE ) 50 MCG/ACT nasal spray Place 1 spray in each nostril daily at bedtime 06/15/22     fluticasone  (FLONASE ) 50 MCG/ACT nasal spray Place 1 spray into both nostrils at bedtime. 10/04/22     fluticasone  (FLONASE ) 50 MCG/ACT nasal spray Place 1 spray into both nostrils at bedtime. 06/29/23  furosemide  (LASIX ) 20 MG tablet Take 1 tablet (20 mg total) by mouth daily. 10/04/22     furosemide  (LASIX ) 20 MG tablet Take 1 tablet (20 mg) by mouth daily. 01/22/24     gabapentin  (NEURONTIN ) 800 MG tablet TAKE 1 TABLET BY MOUTH EVERY 8 HOURS 01/09/22   Nichols, Tonya S, NP  gabapentin  (NEURONTIN ) 800 MG tablet Take 1 tablet (800 mg total) by mouth 3 (three) times daily. 08/14/23     glipiZIDE  (GLUCOTROL ) 5 MG tablet Take 1 tablet (5 mg total) by mouth daily. Patient not taking: Reported on 08/23/2023 10/04/22     glipiZIDE  (GLUCOTROL ) 5 MG tablet Take 1 tablet (5 mg total) by mouth daily. 10/06/22     HYDROcodone -acetaminophen  (NORCO) 7.5-325 MG tablet Take 1 tablet by mouth every 6 (six) hours as needed. Patient not taking: Reported on 08/23/2023 06/06/23     hydrOXYzine  (VISTARIL ) 50 MG capsule Take 1 capsule (50 mg total) by mouth 4 (four) times daily as needed for anxiety or nausea. Patient not taking: Reported on 08/23/2023 09/08/21   Nivia Colon, PA-C  ibuprofen  (ADVIL ) 800 MG tablet Take 1 tablet (800 mg total) by mouth 3 (three) times daily with meals as needed. Patient not taking: Reported on 08/23/2023 05/28/22     ibuprofen  (ADVIL ) 800 MG tablet Take 1 tablet (800 mg total) by mouth every 8 (eight) hours as needed. Patient not taking: Reported on 08/23/2023 06/06/23     Lactulose  20 GM/30ML SOLN Drink 30mL once daily as needed for constipation 12/03/23     Lactulose  20  GM/30ML SOLN Drink 30mL once daily as needed for constipation 12/03/23     latanoprost  (XALATAN ) 0.005 % ophthalmic solution Instill 1 drop in eyes daily as directed 01/11/22     latanoprost  (XALATAN ) 0.005 % ophthalmic solution Place 1 drop in both eyes daily 02/04/22     latanoprost  (XALATAN ) 0.005 % ophthalmic solution Instill 1 Drop in eye(s) daily 03/05/22     latanoprost  (XALATAN ) 0.005 % ophthalmic solution Place 1 drop in both eyes daily. 06/15/22     latanoprost  (XALATAN ) 0.005 % ophthalmic solution Place 1 drop into both eyes daily. 09/05/22     latanoprost  (XALATAN ) 0.005 % ophthalmic solution Place 1 drop into eye(s) daily. 10/04/22     latanoprost  (XALATAN ) 0.005 % ophthalmic solution Place 1 drop into both eyes daily. 10/06/22     latanoprost  (XALATAN ) 0.005 % ophthalmic solution Place 1 drop into both eyes every evening. 10/15/23     meloxicam  (MOBIC ) 15 MG tablet Take 1 tablet (15 mg total) by mouth daily. Patient not taking: Reported on 08/23/2023 03/28/23     meloxicam  (MOBIC ) 15 MG tablet Take 1 tablet (15 mg total) by mouth daily. 10/29/23   Sikora, Rebecca, DPM  metroNIDAZOLE  (FLAGYL ) 500 MG tablet Take 1 tablet by mouth every 12 hours 11/21/23     metroNIDAZOLE  (METROGEL ) 0.75 % vaginal gel Insert one applicatorful into vagina once daily for 7 days as directed. Patient not taking: Reported on 08/23/2023 05/15/23     mirtazapine  (REMERON ) 15 MG tablet Take 1 tablet by mouth at bedtime for major depressive disorder Patient not taking: Reported on 08/23/2023 02/16/22     Multiple Vitamins-Minerals (WOMENS MULTIVITAMIN PO) Take 1 tablet by mouth daily.     [provider]  omeprazole  (PRILOSEC) 20 MG capsule Take 1 capsule (20 mg total) by mouth daily. 02/04/22     omeprazole  (PRILOSEC) 20 MG capsule Take 1 capsule by mouth daily. 02/27/22  omeprazole  (PRILOSEC) 20 MG capsule Take 1 capsule by mouth daily. 02/27/22     omeprazole  (PRILOSEC) 20 MG capsule Take 1 capsule by mouth daily  03/05/22     omeprazole  (PRILOSEC) 20 MG capsule Take 1 capsule (20 mg total) by mouth daily. 03/09/23     omeprazole  (PRILOSEC) 40 MG capsule Take 1 capsule (40 mg total) by mouth daily. 07/25/21 01/21/22  Myrna Camelia HERO, NP  omeprazole  (PRILOSEC) 40 MG capsule Take 1 capsule (40 mg total) by mouth daily. 09/05/22     omeprazole  (PRILOSEC) 40 MG capsule Take 1 capsule (40 mg total) by mouth daily. 10/04/22     omeprazole  (PRILOSEC) 40 MG capsule Take 1 capsule (40 mg total) by mouth daily. 10/06/22     omeprazole  (PRILOSEC) 40 MG capsule Take 1 capsule (40 mg total) by mouth daily. 06/18/23     omeprazole  (PRILOSEC) 40 MG capsule Take 1 capsule (40 mg total) by mouth at bedtime. 10/29/23     ondansetron  (ZOFRAN ) 4 MG tablet Take 1 tablet by mouth every 12 hours as needed for nausea/vomiting 08/14/23     ondansetron  (ZOFRAN ) 4 MG tablet Take 1 tablet (4 mg total) by mouth every 12 (twelve) hours as needed for nausea/vomiting 11/29/23     rosuvastatin  (CRESTOR ) 10 MG tablet Take 1 tablet (10 mg total) by mouth at bedtime  for cholesterol. 01/30/24     rosuvastatin  (CRESTOR ) 5 MG tablet Take 1 tablet (5 mg total) by mouth daily. 07/25/21 01/21/22  Myrna Camelia HERO, NP  rosuvastatin  (CRESTOR ) 5 MG tablet Take 1 tablet by mouth daily. 05/08/22     rosuvastatin  (CRESTOR ) 5 MG tablet Take 1 tablet (5 mg total) by mouth daily. 05/28/22     rosuvastatin  (CRESTOR ) 5 MG tablet Take 1 tablet (5 mg total) by mouth daily. 06/15/22     rosuvastatin  (CRESTOR ) 5 MG tablet Take 1 tablet (5 mg total) by mouth daily. 09/05/22     rosuvastatin  (CRESTOR ) 5 MG tablet Take 1 tablet (5 mg total) by mouth daily. 10/04/22     rosuvastatin  (CRESTOR ) 5 MG tablet Take 1 tablet (5 mg total) by mouth daily. 10/06/22     Semaglutide -Weight Management (WEGOVY ) 0.25 MG/0.5ML SOAJ Inject 0.25 mg into the skin once a week. 11/07/23     Semaglutide -Weight Management (WEGOVY ) 0.5 MG/0.5ML SOAJ Inject 0.5 mg into the skin once a week. 11/07/23        Allergies: Clindamycin/lincomycin, Other, Hydrocodone , and Morphine    Review of Systems  Updated Vital Signs BP (!) 146/100   Pulse 79   Temp 97.9 F (36.6 C) (Axillary)   Resp 17   LMP 09/14/2019 (Approximate)   SpO2 99%   Physical Exam Constitutional:      General: She is not in acute distress.    Appearance: She is not ill-appearing.     Interventions: She is restrained.     Comments: 4 point restraints secondary to agitation  HENT:     Head: Normocephalic and atraumatic.  Eyes:     Pupils: Pupils are equal, round, and reactive to light.  Cardiovascular:     Rate and Rhythm: Normal rate and regular rhythm.     Heart sounds: Normal heart sounds.  Pulmonary:     Effort: Pulmonary effort is normal. No tachypnea.  Abdominal:     General: There is no distension.  Musculoskeletal:     Cervical back: Full passive range of motion without pain.     Right lower leg: No  edema.     Left lower leg: No edema.     Comments: Spontaneous movement of bilateral upper and lower extremities  Neurological:     Mental Status: She is alert.     Cranial Nerves: No facial asymmetry.     Motor: No weakness, seizure activity or pronator drift.     Comments: Unable to perform full neurologic exam secondary to uncooperative patient however patient will spontaneously open eyes in response to voice.  She refuses to answer questions regarding history of present illness and medical history.  When multiple providers into room patient begins to move bilateral upper and lower extremities spontaneously and in a jerking motion which resolves upon leaving the room  Psychiatric:        Behavior: Behavior is uncooperative.     (all labs ordered are listed, but only abnormal results are displayed) Labs Reviewed  COMPREHENSIVE METABOLIC PANEL WITH GFR - Abnormal; Notable for the following components:      Result Value   Potassium 3.1 (*)    Glucose, Bld 101 (*)    All other components within normal  limits  CBC WITH DIFFERENTIAL/PLATELET - Abnormal; Notable for the following components:   RBC 5.74 (*)    Hemoglobin 17.0 (*)    HCT 54.0 (*)    All other components within normal limits  SALICYLATE LEVEL - Abnormal; Notable for the following components:   Salicylate Lvl <7.0 (*)    All other components within normal limits  ACETAMINOPHEN  LEVEL - Abnormal; Notable for the following components:   Acetaminophen  (Tylenol ), Serum <10 (*)    All other components within normal limits  ETHANOL  RAPID URINE DRUG SCREEN, HOSP PERFORMED    EKG: EKG Interpretation Date/Time:  Thursday March 27 2024 21:50:30 EDT Ventricular Rate:  74 PR Interval:  148 QRS Duration:  87 QT Interval:  390 QTC Calculation: 433 R Axis:   -24  Text Interpretation: Sinus rhythm LAE, consider biatrial enlargement Borderline left axis deviation No significant change since last tracing Confirmed by Emil Share [phone]) on 03/27/2024 10:18:21 PM  Radiology: No results found.   Procedures   Medications Ordered in the ED  OLANZapine  zydis (ZYPREXA ) disintegrating tablet 10 mg (10 mg Oral Given 03/27/24 2301)  lactated ringers  bolus 1,000 mL (1,000 mLs Intravenous New Bag/Given 03/27/24 2344)    Clinical Course as of 03/27/24 2347  Thu Mar 27, 2024  2336 EKG reviewed by me: Sinus rhythm with normal axis and intervals.  Nonspecific T wave inversions in lead aVL, V2. [AG]    Clinical Course User Index [AG] Nada Chroman, DO                                 Medical Decision Making Amount and/or Complexity of Data Reviewed Labs: ordered.  Risk Prescription drug management.   On initial evaluation patient is alert, uncooperative, hemodynamically stable and not in acute distress.  Patient is in 4 point restraints with concern for safety to staff and patient.  Based on patient's history concern for polysubstance use, accidental ingestion, electrolyte abnormality, infection, intoxication.  Will obtain laboratory  studies as well as EKG.  Laboratory studies with evidence of hemoconcentration therefore will give fluid bolus.  During evaluation patient is moving all extremities spontaneously, has no focal neurologic deficits based upon examination however is uncooperative and will not state medical history or events leading up to patient's presentation.  Upon reevaluation 4 point  restraints removed.  Laboratory studies reviewed without evidence of severe electrolyte abnormalities, anemia, infection, increased acetaminophen  or salicylate level and ethanol within normal limits.  At this time patient will be reevaluated after fluid bolus.  Will give patient a period of metabolization in the setting of drug ingestion and reassess to determine disposition.  Patient was given to oncoming team for continued care management.     Final diagnoses:  Altered mental status, unspecified altered mental status type  Dehydration    ED Discharge Orders     None       Lavanda Bolster DO Emergency Medicine PGY2   Bolster Lavanda, DO 03/27/24 2348    Emil Share, DO 03/28/24 1504

## 2024-03-27 NOTE — ED Triage Notes (Addendum)
 Patient BIB [facility] from home after daughter called for laced cigarette. Initially patient was able to state name and admitted to taking suboxone  and cocaine. Patient is A&Ox0, on arrival, in EMS restraints, and required 5mg  haldol and 5mg  midazolam  en route for patient safety as she was banging her head on the wall and threw herself on the ground per EMS.  Patient remains uncooperative with  assessments and agitated, throwing herself around in the bed, however vital signs are stable. EDP at bedside assisting in placing patient in soft restraints for safety, providers verbally order for restraints to be placed.

## 2024-03-27 NOTE — ED Notes (Signed)
 [facility] made aware pts current status. Verbal order for soft wrist restraints PRN from EDP

## 2024-03-28 ENCOUNTER — Emergency Department (HOSPITAL_COMMUNITY)

## 2024-03-28 DIAGNOSIS — R4182 Altered mental status, unspecified: Secondary | ICD-10-CM | POA: Diagnosis not present

## 2024-03-28 LAB — RAPID URINE DRUG SCREEN, HOSP PERFORMED
Amphetamines: POSITIVE — AB
Barbiturates: NOT DETECTED
Benzodiazepines: POSITIVE — AB
Cocaine: POSITIVE — AB
Opiates: NOT DETECTED
Tetrahydrocannabinol: POSITIVE — AB

## 2024-03-28 MED ORDER — ONDANSETRON HCL 4 MG/2ML IJ SOLN
4.0000 mg | Freq: Once | INTRAMUSCULAR | Status: AC
  Administered 2024-03-28: 4 mg via INTRAVENOUS
  Filled 2024-03-28: qty 2

## 2024-03-28 MED ORDER — METOCLOPRAMIDE HCL 5 MG/ML IJ SOLN
10.0000 mg | Freq: Once | INTRAMUSCULAR | Status: AC
  Administered 2024-03-28: 10 mg via INTRAVENOUS
  Filled 2024-03-28: qty 2

## 2024-03-28 MED ORDER — LACTATED RINGERS IV BOLUS
1000.0000 mL | Freq: Once | INTRAVENOUS | Status: AC
  Administered 2024-03-28: 1000 mL via INTRAVENOUS

## 2024-03-28 NOTE — ED Notes (Signed)
Water provided to pt.

## 2024-03-28 NOTE — ED Notes (Addendum)
 Pt incontinent and urinated on self. Pt bedding changed and pt cleaned and put in new gown

## 2024-03-28 NOTE — ED Notes (Signed)
 Patient ambulated to the restroom with assistance. Instructed to call when finished. 10 minutes later she had not called, this RN went to check on her and found her leaned over and asleep on the toilet.

## 2024-03-28 NOTE — ED Notes (Signed)
Patient given crackers and water for a PO challenge

## 2024-03-28 NOTE — ED Notes (Signed)
 Patient vomiting. Emesis bag given.

## 2024-03-28 NOTE — Discharge Instructions (Addendum)
 Dont do drugs, they bad for you and can cause many health issues. Follow-up with your doctor. Return here for new concerns.

## 2024-03-28 NOTE — ED Notes (Signed)
 Pt vomited on self and floor. Pt also urinated on self. Pt cleaned up and changed into clean gown

## 2024-03-28 NOTE — ED Provider Notes (Signed)
  Assumed care at shift change.  See prior notes for full H&P.  Briefly, 48 y.o. F here with AMS, felt to be substance induced.  Admitted to using cocaine and suboxone .  Given haldol and versed  with EMS for sedation as she was agitated and combative.    Work-up here grossly reassuring.  UDS + for cocaine, benzos, amphetamines, and THC.  Ethanol, APAP, salicylate levels WNL.   Plan:  will metabolize here.    6:11 AM Patient reassessed-- still quite drowsy.  Has been HD stable overnight.  Shakes her head yes that she feels ok.  Attempted to drink some water but fell asleep.  Will need to metabolize further.  Care will be signed out to oncoming provider.  Anticipate discharge once fully awake/alert and tolerating PO.   Jarold Olam HERO, PA-C 03/28/24 [id number]    Jerral Meth, MD 03/28/24 2300

## 2024-03-28 NOTE — ED Provider Notes (Signed)
 Patient care assumed from previous provider.   Patient care of Leah Barber is a 48 y.o. female from previous provider. Please see the original provider note from this emergency department encounter for full history and physical.   Course of Care and my assessment at the time of sign out is detailed in the ED Course below.   Clinical Course as of 03/28/24 1125  Thu Mar 27, 2024  2336 EKG reviewed by me: Sinus rhythm with normal axis and intervals.  Nonspecific T wave inversions in lead aVL, V2. [AG]  Fri Mar 28, 2024  0629 S- pan positive drug screen Versed , haldol, zyprexa  d/t agitation Pass road test and dc [RC]  1025 CT Head Wo Contrast No intracranial abnormality [RC]    Clinical Course User Index [AG] Nada Chroman, DO [RC] Sharyne Darina RAMAN, MD    On my reassessment patient still relatively somnolent however oxygenating well.  She will open her eyes to physical stimulation and answer yes or no questions with a head nod however will not sit up, ambulate or take p.o.  She was given further time to metabolize, CT head obtained given prolonged nature of her somnolence.  This is reassuring as above.  On further reassessment patient is not vomiting.  She was given Zofran  and was ambulatory, taking p.o. and thus I believe appropriate for discharge.  Labs reviewed by myself and considered in medical decision making.  Imaging reviewed by myself and considered in medical decision making. Imaging final read interpreted by radiology.  1. Altered mental status, unspecified altered mental status type   2. Dehydration   3. Polysubstance abuse [facility])      Discharge    The plan for this patient was discussed with Dr. Yolande, who voiced agreement and who oversaw evaluation and treatment of this patient.    Sharyne Darina RAMAN, MD 03/28/24 1125    Yolande Lamar BROCKS, MD 03/29/24 [phone]

## 2024-03-28 NOTE — ED Notes (Signed)
 Patient vomiting. EDP notified. Have not PO challenged yet.

## 2024-03-31 ENCOUNTER — Other Ambulatory Visit (HOSPITAL_COMMUNITY): Payer: Self-pay

## 2024-04-01 ENCOUNTER — Other Ambulatory Visit (HOSPITAL_COMMUNITY): Payer: Self-pay

## 2024-04-01 MED ORDER — NITROFURANTOIN MONOHYD MACRO 100 MG PO CAPS
100.0000 mg | ORAL_CAPSULE | Freq: Two times a day (BID) | ORAL | 0 refills | Status: DC
  Filled 2024-04-01 – 2024-04-04 (×2): qty 14, 7d supply, fill #0

## 2024-04-01 MED ORDER — FLUCONAZOLE 150 MG PO TABS
150.0000 mg | ORAL_TABLET | Freq: Once | ORAL | 0 refills | Status: AC
  Filled 2024-04-01: qty 1, 1d supply, fill #0

## 2024-04-04 ENCOUNTER — Other Ambulatory Visit (HOSPITAL_COMMUNITY): Payer: Self-pay

## 2024-04-10 ENCOUNTER — Other Ambulatory Visit (HOSPITAL_COMMUNITY): Payer: Self-pay

## 2024-04-11 ENCOUNTER — Other Ambulatory Visit (HOSPITAL_COMMUNITY): Payer: Self-pay

## 2024-04-22 ENCOUNTER — Other Ambulatory Visit (HOSPITAL_COMMUNITY): Payer: Self-pay

## 2024-04-22 MED ORDER — MUPIROCIN 2 % EX OINT
1.0000 | TOPICAL_OINTMENT | Freq: Two times a day (BID) | CUTANEOUS | 0 refills | Status: DC
  Filled 2024-04-22: qty 15, 8d supply, fill #0

## 2024-04-24 ENCOUNTER — Other Ambulatory Visit (HOSPITAL_COMMUNITY): Payer: Self-pay

## 2024-04-25 ENCOUNTER — Other Ambulatory Visit (HOSPITAL_COMMUNITY): Payer: Self-pay

## 2024-04-30 ENCOUNTER — Other Ambulatory Visit (HOSPITAL_COMMUNITY): Payer: Self-pay

## 2024-05-01 ENCOUNTER — Other Ambulatory Visit (HOSPITAL_COMMUNITY): Payer: Self-pay

## 2024-05-05 ENCOUNTER — Other Ambulatory Visit (HOSPITAL_COMMUNITY): Payer: Self-pay

## 2024-05-06 ENCOUNTER — Other Ambulatory Visit (HOSPITAL_COMMUNITY): Payer: Self-pay

## 2024-05-06 MED ORDER — FLUTICASONE PROPIONATE 50 MCG/ACT NA SUSP
1.0000 | Freq: Every evening | NASAL | 0 refills | Status: DC
  Filled 2024-05-06: qty 16, 30d supply, fill #0
  Filled 2024-06-09: qty 16, 60d supply, fill #0

## 2024-05-17 ENCOUNTER — Other Ambulatory Visit (HOSPITAL_COMMUNITY): Payer: Self-pay

## 2024-06-09 ENCOUNTER — Other Ambulatory Visit (HOSPITAL_COMMUNITY): Payer: Self-pay

## 2024-07-10 ENCOUNTER — Other Ambulatory Visit (HOSPITAL_COMMUNITY): Payer: Self-pay

## 2024-07-31 ENCOUNTER — Other Ambulatory Visit (HOSPITAL_COMMUNITY): Payer: Self-pay

## 2024-07-31 MED ORDER — GABAPENTIN 800 MG PO TABS
800.0000 mg | ORAL_TABLET | Freq: Three times a day (TID) | ORAL | 2 refills | Status: AC
  Filled 2024-07-31 – 2024-08-18 (×2): qty 270, 90d supply, fill #0

## 2024-08-04 ENCOUNTER — Other Ambulatory Visit (HOSPITAL_COMMUNITY): Payer: Self-pay

## 2024-08-04 MED ORDER — FLUTICASONE PROPIONATE 50 MCG/ACT NA SUSP
1.0000 | Freq: Every day | NASAL | 0 refills | Status: DC
  Filled 2024-08-04: qty 16, 60d supply, fill #0
  Filled 2024-08-18: qty 16, 30d supply, fill #0

## 2024-08-09 ENCOUNTER — Other Ambulatory Visit (HOSPITAL_COMMUNITY): Payer: Self-pay

## 2024-08-13 ENCOUNTER — Other Ambulatory Visit (HOSPITAL_COMMUNITY): Payer: Self-pay

## 2024-08-18 ENCOUNTER — Other Ambulatory Visit (HOSPITAL_COMMUNITY): Payer: Self-pay

## 2024-08-24 ENCOUNTER — Inpatient Hospital Stay (HOSPITAL_COMMUNITY)
Admission: EM | Admit: 2024-08-24 | Discharge: 2024-08-26 | DRG: 871 | Disposition: A | Discharge status: Home / Self Care

## 2024-08-24 ENCOUNTER — Encounter (HOSPITAL_COMMUNITY): Payer: Self-pay

## 2024-08-24 ENCOUNTER — Emergency Department (HOSPITAL_COMMUNITY)

## 2024-08-24 ENCOUNTER — Other Ambulatory Visit: Payer: Self-pay

## 2024-08-24 DIAGNOSIS — E119 Type 2 diabetes mellitus without complications: Secondary | ICD-10-CM | POA: Diagnosis present

## 2024-08-24 DIAGNOSIS — R4182 Altered mental status, unspecified: Principal | ICD-10-CM

## 2024-08-24 DIAGNOSIS — Z7984 Long term (current) use of oral hypoglycemic drugs: Secondary | ICD-10-CM

## 2024-08-24 DIAGNOSIS — G928 Other toxic encephalopathy: Secondary | ICD-10-CM | POA: Diagnosis present

## 2024-08-24 DIAGNOSIS — F17210 Nicotine dependence, cigarettes, uncomplicated: Secondary | ICD-10-CM | POA: Diagnosis present

## 2024-08-24 DIAGNOSIS — A09 Infectious gastroenteritis and colitis, unspecified: Secondary | ICD-10-CM | POA: Diagnosis present

## 2024-08-24 DIAGNOSIS — Z7985 Long-term (current) use of injectable non-insulin antidiabetic drugs: Secondary | ICD-10-CM

## 2024-08-24 DIAGNOSIS — M545 Low back pain, unspecified: Secondary | ICD-10-CM | POA: Diagnosis present

## 2024-08-24 DIAGNOSIS — A419 Sepsis, unspecified organism: Principal | ICD-10-CM | POA: Diagnosis present

## 2024-08-24 DIAGNOSIS — H409 Unspecified glaucoma: Secondary | ICD-10-CM | POA: Diagnosis present

## 2024-08-24 DIAGNOSIS — E860 Dehydration: Secondary | ICD-10-CM | POA: Diagnosis present

## 2024-08-24 DIAGNOSIS — Z885 Allergy status to narcotic agent status: Secondary | ICD-10-CM

## 2024-08-24 DIAGNOSIS — F319 Bipolar disorder, unspecified: Secondary | ICD-10-CM | POA: Diagnosis present

## 2024-08-24 DIAGNOSIS — J45909 Unspecified asthma, uncomplicated: Secondary | ICD-10-CM | POA: Diagnosis present

## 2024-08-24 DIAGNOSIS — E876 Hypokalemia: Secondary | ICD-10-CM | POA: Diagnosis present

## 2024-08-24 DIAGNOSIS — E785 Hyperlipidemia, unspecified: Secondary | ICD-10-CM | POA: Diagnosis present

## 2024-08-24 DIAGNOSIS — Z1152 Encounter for screening for COVID-19: Secondary | ICD-10-CM

## 2024-08-24 DIAGNOSIS — Z79899 Other long term (current) drug therapy: Secondary | ICD-10-CM

## 2024-08-24 DIAGNOSIS — Z881 Allergy status to other antibiotic agents status: Secondary | ICD-10-CM

## 2024-08-24 DIAGNOSIS — K529 Noninfective gastroenteritis and colitis, unspecified: Secondary | ICD-10-CM

## 2024-08-24 DIAGNOSIS — Z8249 Family history of ischemic heart disease and other diseases of the circulatory system: Secondary | ICD-10-CM

## 2024-08-24 DIAGNOSIS — G894 Chronic pain syndrome: Secondary | ICD-10-CM | POA: Diagnosis present

## 2024-08-24 DIAGNOSIS — Z833 Family history of diabetes mellitus: Secondary | ICD-10-CM

## 2024-08-24 DIAGNOSIS — G932 Benign intracranial hypertension: Secondary | ICD-10-CM | POA: Diagnosis present

## 2024-08-24 DIAGNOSIS — Z800 Family history of malignant neoplasm of digestive organs: Secondary | ICD-10-CM

## 2024-08-24 DIAGNOSIS — R509 Fever, unspecified: Secondary | ICD-10-CM

## 2024-08-24 DIAGNOSIS — Z90710 Acquired absence of both cervix and uterus: Secondary | ICD-10-CM

## 2024-08-24 DIAGNOSIS — I10 Essential (primary) hypertension: Secondary | ICD-10-CM | POA: Diagnosis present

## 2024-08-24 DIAGNOSIS — E872 Acidosis, unspecified: Secondary | ICD-10-CM | POA: Diagnosis present

## 2024-08-24 DIAGNOSIS — K219 Gastro-esophageal reflux disease without esophagitis: Secondary | ICD-10-CM | POA: Diagnosis present

## 2024-08-24 LAB — I-STAT CG4 LACTIC ACID, ED: Lactic Acid, Venous: 4.6 mmol/L (ref 0.5–1.9)

## 2024-08-24 MED ORDER — LACTATED RINGERS IV BOLUS
1000.0000 mL | Freq: Once | INTRAVENOUS | Status: AC
  Administered 2024-08-25: 1000 mL via INTRAVENOUS

## 2024-08-24 NOTE — ED Triage Notes (Signed)
 Brought from jail with AMS. JAil reports she has been drunk x 3 days at the jail and was going to release her and realized she was altered.

## 2024-08-24 NOTE — ED Provider Notes (Signed)
 "  EMERGENCY DEPARTMENT AT [facility] Provider Note   CSN: [id number] Arrival date & time: 08/24/24  2302     Patient presents with: Altered Mental Status   Leah Barber is a 49 y.o. female.  {Add pertinent medical, surgical, social history, OB history to HPI:32947} HPI     This a 49 year old female with history of polysubstance abuse who presents from the jail with altered mental status.  Per report, has been drunk in jail for the last 3 days.  She was up for release and they realized that she was altered.  There is also some question of whether she took more of her amlodipine  than prescribed while in jail.  Patient does not provide much of a history.  She is awake.  She is alert and oriented to self but not time or place.  Denies any pain.    Prior to Admission medications  Medication Sig Start Date End Date Taking? Authorizing Provider  amLODipine  (NORVASC ) 10 MG tablet Take 1 tablet (10 mg total) by mouth daily. 08/24/23     Buprenorphine  HCl-Naloxone  HCl 8-2 MG FILM Place 1 film under tongue three times a day 01/23/24     cetirizine  (ZYRTEC ) 10 MG tablet TAKE 1 TABLET BY MOUTH EVERY DAY 04/11/21   Myrna Camelia HERO, NP  cloNIDine  (CATAPRES ) 0.1 MG tablet Take 1 tablet (0.1 mg total) by mouth 3 (three) times daily. 03/12/24     dapagliflozin  propanediol (FARXIGA ) 10 MG TABS tablet Take 1 tablet (10 mg) by mouth every morning. 01/22/24     dapagliflozin  propanediol (FARXIGA ) 10 MG TABS tablet Take 1 tablet (10 mg) by mouth every morning. 01/30/24     DULoxetine  (CYMBALTA ) 60 MG capsule Take 1 capsule (60 mg total) by mouth 2 (two) times daily for depression 01/08/24     fluticasone  (FLONASE ) 50 MCG/ACT nasal spray Place 1 spray into both nostrils at bedtime. 08/04/24     furosemide  (LASIX ) 20 MG tablet Take 1 tablet (20 mg) by mouth daily. 01/22/24     gabapentin  (NEURONTIN ) 800 MG tablet Take 1 tablet (800 mg total) by mouth 3 (three) times daily. 07/31/24     KLOXXADO  8  MG/0.1ML LIQD Place into both nostrils. 03/20/24   [provider]  Lactulose  20 GM/30ML SOLN Drink 30mL once daily as needed for constipation 12/03/23     latanoprost  (XALATAN ) 0.005 % ophthalmic solution Place 1 drop into both eyes daily. 09/05/22     latanoprost  (XALATAN ) 0.005 % ophthalmic solution Place 1 drop into both eyes every evening. 10/15/23     Multiple Vitamins-Minerals (WOMENS MULTIVITAMIN PO) Take 1 tablet by mouth daily.     [provider]  mupirocin  ointment (BACTROBAN ) 2 % Apply with cotton swab to the nares twice a day for 7 days 04/22/24     nitrofurantoin , macrocrystal-monohydrate, (MACROBID ) 100 MG capsule Take 1 capsule (100 mg total) by mouth 2 (two) times daily for 7 days 04/01/24     omeprazole  (PRILOSEC) 40 MG capsule Take 1 capsule (40 mg total) by mouth daily. 06/18/23     omeprazole  (PRILOSEC) 40 MG capsule Take 1 capsule (40 mg total) by mouth at bedtime. 10/29/23     Prenatal Vit-Fe Fumarate-FA (M-NATAL PLUS) 27-1 MG TABS Take 1 tablet by mouth daily. 02/28/24   [provider]  rosuvastatin  (CRESTOR ) 10 MG tablet Take 1 tablet (10 mg total) by mouth at bedtime  for cholesterol. 01/30/24     Semaglutide -Weight Management (WEGOVY ) 0.5  MG/0.5ML SOAJ Inject 0.5 mg into the skin once a week. 11/07/23       Allergies: Clindamycin/lincomycin, Other, Hydrocodone , and Morphine    Review of Systems  Constitutional:  Negative for fever.  Respiratory:  Negative for shortness of breath.   Cardiovascular:  Negative for chest pain.  Gastrointestinal:  Negative for abdominal pain.  Psychiatric/Behavioral:  Positive for confusion.   All other systems reviewed and are negative.   Updated Vital Signs BP (!) 157/115   Pulse (!) 102   Temp 99.2 F (37.3 C) (Oral)   Resp 20   Ht 1.575 m (5' 2)   Wt 80.3 kg   LMP 09/14/2019   SpO2 100%   BMI 32.38 kg/m   Physical Exam Vitals and nursing note reviewed.  Constitutional:      Appearance: She is  well-developed.     Comments: Alert but disoriented  HENT:     Head: Normocephalic and atraumatic.     Nose: Nose normal.     Mouth/Throat:     Mouth: Mucous membranes are dry.  Eyes:     Pupils: Pupils are equal, round, and reactive to light.     Comments: Pupils 4 and reactive bilaterally  Cardiovascular:     Rate and Rhythm: Normal rate and regular rhythm.     Heart sounds: Normal heart sounds.  Pulmonary:     Effort: Pulmonary effort is normal. No respiratory distress.     Breath sounds: No wheezing.  Abdominal:     Palpations: Abdomen is soft.     Tenderness: There is no abdominal tenderness. There is no guarding or rebound.  Musculoskeletal:     Cervical back: Neck supple.  Skin:    General: Skin is warm and dry.  Neurological:     Mental Status: She is alert.     Comments: Oriented to self Moves all 4 extremities equally     (all labs ordered are listed, but only abnormal results are displayed) Labs Reviewed  CBC WITH DIFFERENTIAL/PLATELET  COMPREHENSIVE METABOLIC PANEL WITH GFR  SALICYLATE LEVEL  ACETAMINOPHEN  LEVEL  ETHANOL  URINE DRUG SCREEN  URINALYSIS, ROUTINE W REFLEX MICROSCOPIC  I-STAT CG4 LACTIC ACID, ED    EKG: None  Radiology: No results found.  {Document cardiac monitor, telemetry assessment procedure when appropriate:32947} Procedures   Medications Ordered in the ED - No data to display    {Click here for ABCD2, HEART and other calculators REFRESH Note before signing:1}                              Medical Decision Making Amount and/or Complexity of Data Reviewed Labs: ordered. Radiology: ordered.   ***  {Document critical care time when appropriate  Document review of labs and clinical decision tools ie CHADS2VASC2, etc  Document your independent review of radiology images and any outside records  Document your discussion with family members, caretakers and with consultants  Document social determinants of health affecting pt's  care  Document your decision making why or why not admission, treatments were needed:32947:::1}   Final diagnoses:  None    ED Discharge Orders     None        "

## 2024-08-25 ENCOUNTER — Emergency Department (HOSPITAL_COMMUNITY)

## 2024-08-25 ENCOUNTER — Other Ambulatory Visit (HOSPITAL_COMMUNITY): Payer: Self-pay

## 2024-08-25 DIAGNOSIS — Z7985 Long-term (current) use of injectable non-insulin antidiabetic drugs: Secondary | ICD-10-CM | POA: Diagnosis not present

## 2024-08-25 DIAGNOSIS — Z800 Family history of malignant neoplasm of digestive organs: Secondary | ICD-10-CM | POA: Diagnosis not present

## 2024-08-25 DIAGNOSIS — J45909 Unspecified asthma, uncomplicated: Secondary | ICD-10-CM | POA: Diagnosis present

## 2024-08-25 DIAGNOSIS — R4182 Altered mental status, unspecified: Secondary | ICD-10-CM | POA: Diagnosis present

## 2024-08-25 DIAGNOSIS — G928 Other toxic encephalopathy: Secondary | ICD-10-CM | POA: Diagnosis present

## 2024-08-25 DIAGNOSIS — K219 Gastro-esophageal reflux disease without esophagitis: Secondary | ICD-10-CM | POA: Diagnosis present

## 2024-08-25 DIAGNOSIS — E876 Hypokalemia: Secondary | ICD-10-CM | POA: Diagnosis present

## 2024-08-25 DIAGNOSIS — Z79899 Other long term (current) drug therapy: Secondary | ICD-10-CM | POA: Diagnosis not present

## 2024-08-25 DIAGNOSIS — G894 Chronic pain syndrome: Secondary | ICD-10-CM | POA: Diagnosis present

## 2024-08-25 DIAGNOSIS — A419 Sepsis, unspecified organism: Secondary | ICD-10-CM | POA: Diagnosis present

## 2024-08-25 DIAGNOSIS — Z1152 Encounter for screening for COVID-19: Secondary | ICD-10-CM | POA: Diagnosis not present

## 2024-08-25 DIAGNOSIS — G932 Benign intracranial hypertension: Secondary | ICD-10-CM | POA: Diagnosis present

## 2024-08-25 DIAGNOSIS — M545 Low back pain, unspecified: Secondary | ICD-10-CM | POA: Diagnosis present

## 2024-08-25 DIAGNOSIS — H409 Unspecified glaucoma: Secondary | ICD-10-CM | POA: Diagnosis present

## 2024-08-25 DIAGNOSIS — E860 Dehydration: Secondary | ICD-10-CM | POA: Diagnosis present

## 2024-08-25 DIAGNOSIS — F319 Bipolar disorder, unspecified: Secondary | ICD-10-CM | POA: Diagnosis present

## 2024-08-25 DIAGNOSIS — E872 Acidosis, unspecified: Secondary | ICD-10-CM | POA: Diagnosis present

## 2024-08-25 DIAGNOSIS — A09 Infectious gastroenteritis and colitis, unspecified: Secondary | ICD-10-CM | POA: Diagnosis present

## 2024-08-25 DIAGNOSIS — Z8249 Family history of ischemic heart disease and other diseases of the circulatory system: Secondary | ICD-10-CM | POA: Diagnosis not present

## 2024-08-25 DIAGNOSIS — I10 Essential (primary) hypertension: Secondary | ICD-10-CM | POA: Diagnosis present

## 2024-08-25 DIAGNOSIS — F17210 Nicotine dependence, cigarettes, uncomplicated: Secondary | ICD-10-CM | POA: Diagnosis present

## 2024-08-25 DIAGNOSIS — E785 Hyperlipidemia, unspecified: Secondary | ICD-10-CM | POA: Diagnosis present

## 2024-08-25 DIAGNOSIS — E119 Type 2 diabetes mellitus without complications: Secondary | ICD-10-CM | POA: Diagnosis present

## 2024-08-25 DIAGNOSIS — Z833 Family history of diabetes mellitus: Secondary | ICD-10-CM | POA: Diagnosis not present

## 2024-08-25 LAB — CBC WITH DIFFERENTIAL/PLATELET
Abs Immature Granulocytes: 0.1 K/uL — ABNORMAL HIGH (ref 0.00–0.07)
Basophils Absolute: 0.1 K/uL (ref 0.0–0.1)
Basophils Relative: 1 %
Eosinophils Absolute: 0 K/uL (ref 0.0–0.5)
Eosinophils Relative: 0 %
HCT: 61.3 % — ABNORMAL HIGH (ref 36.0–46.0)
Hemoglobin: 20.9 g/dL — ABNORMAL HIGH (ref 12.0–15.0)
Immature Granulocytes: 1 %
Lymphocytes Relative: 16 %
Lymphs Abs: 1.9 K/uL (ref 0.7–4.0)
MCH: 30.7 pg (ref 26.0–34.0)
MCHC: 34.1 g/dL (ref 30.0–36.0)
MCV: 90 fL (ref 80.0–100.0)
Monocytes Absolute: 0.5 K/uL (ref 0.1–1.0)
Monocytes Relative: 4 %
Neutro Abs: 9.5 K/uL — ABNORMAL HIGH (ref 1.7–7.7)
Neutrophils Relative %: 78 %
Platelets: 279 K/uL (ref 150–400)
RBC: 6.81 MIL/uL — ABNORMAL HIGH (ref 3.87–5.11)
RDW: 12.1 % (ref 11.5–15.5)
WBC: 12 K/uL — ABNORMAL HIGH (ref 4.0–10.5)
nRBC: 0 % (ref 0.0–0.2)

## 2024-08-25 LAB — BASIC METABOLIC PANEL WITH GFR
Anion gap: 21 — ABNORMAL HIGH (ref 5–15)
BUN: 22 mg/dL — ABNORMAL HIGH (ref 6–20)
CO2: 17 mmol/L — ABNORMAL LOW (ref 22–32)
Calcium: 10 mg/dL (ref 8.9–10.3)
Chloride: 98 mmol/L (ref 98–111)
Creatinine, Ser: 0.91 mg/dL (ref 0.44–1.00)
GFR, Estimated: >60 mL/min
Glucose, Bld: 114 mg/dL — ABNORMAL HIGH (ref 70–99)
Potassium: 3.5 mmol/L (ref 3.5–5.1)
Sodium: 136 mmol/L (ref 135–145)

## 2024-08-25 LAB — URINALYSIS, ROUTINE W REFLEX MICROSCOPIC
Bilirubin Urine: NEGATIVE
Glucose, UA: 150 mg/dL — AB
Ketones, ur: 20 mg/dL — AB
Leukocytes,Ua: NEGATIVE
Nitrite: NEGATIVE
Protein, ur: >=300 mg/dL — AB
Specific Gravity, Urine: 1.035 — ABNORMAL HIGH (ref 1.005–1.030)
pH: 6 (ref 5.0–8.0)

## 2024-08-25 LAB — URINE DRUG SCREEN
Amphetamines: NEGATIVE
Barbiturates: NEGATIVE
Benzodiazepines: NEGATIVE
Cocaine: POSITIVE — AB
Fentanyl: POSITIVE — AB
Methadone Scn, Ur: NEGATIVE
Opiates: NEGATIVE
Tetrahydrocannabinol: NEGATIVE

## 2024-08-25 LAB — COMPREHENSIVE METABOLIC PANEL WITH GFR
ALT: 32 U/L (ref 0–44)
AST: 43 U/L — ABNORMAL HIGH (ref 15–41)
Albumin: 5.1 g/dL — ABNORMAL HIGH (ref 3.5–5.0)
Alkaline Phosphatase: 187 U/L — ABNORMAL HIGH (ref 38–126)
Anion gap: 23 — ABNORMAL HIGH (ref 5–15)
BUN: 22 mg/dL — ABNORMAL HIGH (ref 6–20)
CO2: 21 mmol/L — ABNORMAL LOW (ref 22–32)
Calcium: 10.9 mg/dL — ABNORMAL HIGH (ref 8.9–10.3)
Chloride: 95 mmol/L — ABNORMAL LOW (ref 98–111)
Creatinine, Ser: 1.21 mg/dL — ABNORMAL HIGH (ref 0.44–1.00)
GFR, Estimated: 54 mL/min — ABNORMAL LOW
Glucose, Bld: 191 mg/dL — ABNORMAL HIGH (ref 70–99)
Potassium: 3.4 mmol/L — ABNORMAL LOW (ref 3.5–5.1)
Sodium: 139 mmol/L (ref 135–145)
Total Bilirubin: 0.7 mg/dL (ref 0.0–1.2)
Total Protein: 9.6 g/dL — ABNORMAL HIGH (ref 6.5–8.1)

## 2024-08-25 LAB — RESP PANEL BY RT-PCR (RSV, FLU A&B, COVID)  RVPGX2
Influenza A by PCR: NEGATIVE
Influenza B by PCR: NEGATIVE
Resp Syncytial Virus by PCR: NEGATIVE
SARS Coronavirus 2 by RT PCR: NEGATIVE

## 2024-08-25 LAB — ACETAMINOPHEN LEVEL: Acetaminophen (Tylenol), Serum: <10 ug/mL — ABNORMAL LOW (ref 10–30)

## 2024-08-25 LAB — I-STAT CG4 LACTIC ACID, ED: Lactic Acid, Venous: 3.2 mmol/L (ref 0.5–1.9)

## 2024-08-25 LAB — ETHANOL: Alcohol, Ethyl (B): <15 mg/dL

## 2024-08-25 LAB — HIV ANTIBODY (ROUTINE TESTING W REFLEX): HIV Screen 4th Generation wRfx: NONREACTIVE

## 2024-08-25 LAB — SALICYLATE LEVEL: Salicylate Lvl: <7 mg/dL — ABNORMAL LOW (ref 7.0–30.0)

## 2024-08-25 MED ORDER — POLYETHYLENE GLYCOL 3350 17 G PO PACK
17.0000 g | PACK | Freq: Two times a day (BID) | ORAL | Status: DC
  Administered 2024-08-25 – 2024-08-26 (×2): 17 g via ORAL
  Filled 2024-08-25 (×3): qty 1

## 2024-08-25 MED ORDER — BUPRENORPHINE HCL-NALOXONE HCL 2-0.5 MG SL SUBL: 1.0000 | SUBLINGUAL_TABLET | Freq: Three times a day (TID) | SUBLINGUAL | Status: DC | PRN

## 2024-08-25 MED ORDER — PANTOPRAZOLE SODIUM 40 MG PO TBEC
40.0000 mg | DELAYED_RELEASE_TABLET | Freq: Every day | ORAL | Status: DC
  Administered 2024-08-25 – 2024-08-26 (×2): 40 mg via ORAL
  Filled 2024-08-25 (×2): qty 1

## 2024-08-25 MED ORDER — SODIUM CHLORIDE 0.9 % IV SOLN: INTRAVENOUS | Status: AC

## 2024-08-25 MED ORDER — SODIUM CHLORIDE 0.9 % IV SOLN: Freq: Once | INTRAVENOUS | Status: DC

## 2024-08-25 MED ORDER — CIPROFLOXACIN IN D5W 400 MG/200ML IV SOLN
400.0000 mg | INTRAVENOUS | Status: DC
  Administered 2024-08-26: 400 mg via INTRAVENOUS
  Filled 2024-08-25 (×2): qty 200

## 2024-08-25 MED ORDER — SODIUM CHLORIDE 0.9 % IV SOLN: Freq: Once | INTRAVENOUS | Status: AC

## 2024-08-25 MED ORDER — ACETAMINOPHEN 325 MG PO TABS
650.0000 mg | ORAL_TABLET | Freq: Four times a day (QID) | ORAL | Status: DC | PRN
  Administered 2024-08-26: 650 mg via ORAL
  Filled 2024-08-25: qty 2

## 2024-08-25 MED ORDER — ENOXAPARIN SODIUM 40 MG/0.4ML IJ SOSY
40.0000 mg | PREFILLED_SYRINGE | INTRAMUSCULAR | Status: DC
  Administered 2024-08-25 – 2024-08-26 (×2): 40 mg via SUBCUTANEOUS
  Filled 2024-08-25 (×2): qty 0.4

## 2024-08-25 MED ORDER — AMLODIPINE BESYLATE 10 MG PO TABS
10.0000 mg | ORAL_TABLET | Freq: Every day | ORAL | Status: DC
  Administered 2024-08-25 – 2024-08-26 (×2): 10 mg via ORAL
  Filled 2024-08-25: qty 1
  Filled 2024-08-25: qty 2

## 2024-08-25 MED ORDER — ONDANSETRON HCL 4 MG/2ML IJ SOLN
4.0000 mg | Freq: Once | INTRAMUSCULAR | Status: AC
  Administered 2024-08-25: 4 mg via INTRAVENOUS

## 2024-08-25 MED ORDER — METRONIDAZOLE 500 MG/100ML IV SOLN
500.0000 mg | Freq: Once | INTRAVENOUS | Status: AC
  Administered 2024-08-25: 500 mg via INTRAVENOUS
  Filled 2024-08-25: qty 100

## 2024-08-25 MED ORDER — DULOXETINE HCL 60 MG PO CPEP
60.0000 mg | ORAL_CAPSULE | Freq: Two times a day (BID) | ORAL | Status: DC
  Administered 2024-08-25 – 2024-08-26 (×3): 60 mg via ORAL
  Filled 2024-08-25: qty 2
  Filled 2024-08-25 (×2): qty 1

## 2024-08-25 MED ORDER — SODIUM CHLORIDE 0.9 % IV BOLUS
1000.0000 mL | Freq: Once | INTRAVENOUS | Status: AC
  Administered 2024-08-25: 1000 mL via INTRAVENOUS

## 2024-08-25 MED ORDER — HYDRALAZINE HCL 20 MG/ML IJ SOLN
10.0000 mg | Freq: Once | INTRAMUSCULAR | Status: AC
  Administered 2024-08-25: 10 mg via INTRAVENOUS
  Filled 2024-08-25: qty 1

## 2024-08-25 MED ORDER — ROSUVASTATIN CALCIUM 5 MG PO TABS
10.0000 mg | ORAL_TABLET | Freq: Every day | ORAL | Status: DC
  Administered 2024-08-25: 10 mg via ORAL
  Filled 2024-08-25: qty 2

## 2024-08-25 MED ORDER — BUPRENORPHINE HCL-NALOXONE HCL 8-2 MG SL SUBL
1.0000 | SUBLINGUAL_TABLET | Freq: Two times a day (BID) | SUBLINGUAL | Status: DC
  Administered 2024-08-25 – 2024-08-26 (×3): 1 via SUBLINGUAL
  Filled 2024-08-25 (×3): qty 1

## 2024-08-25 MED ORDER — ACETAMINOPHEN 500 MG PO TABS
1000.0000 mg | ORAL_TABLET | Freq: Once | ORAL | Status: AC
  Administered 2024-08-25: 1000 mg via ORAL
  Filled 2024-08-25: qty 2

## 2024-08-25 MED ORDER — LATANOPROST 0.005 % OP SOLN
1.0000 [drp] | Freq: Every evening | OPHTHALMIC | Status: DC
  Administered 2024-08-25: 1 [drp] via OPHTHALMIC
  Filled 2024-08-25: qty 2.5

## 2024-08-25 MED ORDER — GABAPENTIN 400 MG PO CAPS
800.0000 mg | ORAL_CAPSULE | Freq: Three times a day (TID) | ORAL | Status: DC
  Filled 2024-08-25: qty 8

## 2024-08-25 MED ORDER — ONDANSETRON HCL 4 MG/2ML IJ SOLN
4.0000 mg | Freq: Once | INTRAMUSCULAR | Status: DC
  Filled 2024-08-25: qty 2

## 2024-08-25 MED ORDER — IOHEXOL 350 MG/ML SOLN
75.0000 mL | Freq: Once | INTRAVENOUS | Status: AC | PRN
  Administered 2024-08-25: 75 mL via INTRAVENOUS

## 2024-08-25 MED ORDER — SENNOSIDES-DOCUSATE SODIUM 8.6-50 MG PO TABS
2.0000 | ORAL_TABLET | Freq: Two times a day (BID) | ORAL | Status: DC
  Administered 2024-08-25 – 2024-08-26 (×3): 2 via ORAL
  Filled 2024-08-25 (×3): qty 2

## 2024-08-25 MED ORDER — CIPROFLOXACIN IN D5W 400 MG/200ML IV SOLN
400.0000 mg | Freq: Once | INTRAVENOUS | Status: AC
  Administered 2024-08-25: 400 mg via INTRAVENOUS
  Filled 2024-08-25: qty 200

## 2024-08-25 NOTE — ED Notes (Signed)
 CCMD called and notified

## 2024-08-25 NOTE — ED Notes (Signed)
 Pt refusing additional stick for 2nd set of cultures.

## 2024-08-25 NOTE — H&P (Addendum)
 " History and Physical    Patient: Leah Barber FMW:[id number] DOB: 1976-04-29 DOA: 08/24/2024 DOS: the patient was seen and examined on 08/25/2024 PCP: Maree Leni Edyth DELENA, MD  Patient coming from: Home  Chief Complaint:  Chief Complaint  Patient presents with   Altered Mental Status   HPI: Leah Barber is a 49 y.o. female with medical history significant of essential hypertension, prediabetes, history of polysubstance abuse, bipolar disorder, history of cocaine abuse, history of idiopathic intracranial hypertension, history of personality disorder history of attention deficit disorder, benzodiazepine abuse, and chronic pain syndrome who p/w intractable n/v and colitis on admission imaging.  The patient presented to the hospital with symptoms of dehydration 2/2 intractable n/v. The patient reported running out of medications, specifically Suboxone , which the patient usually took at a dose of 8 mg three times a day. Upon admission, the patient was found to have a positive urine test for cocaine and fentanyl . The patient reported using these substances about a week ago but did not use them regularly.  In the ED, pt febrile to 100.6 x1, hypertensive, tachycardic, and tachypneic w/o hypoxia. Labs notable for Cr 1.21>0.9, WBC 12, and lactic acid 4.6>3.2. UDS positive for cocaine and fentanyl . CTA abdomen showed wall thickening and mucosal enhancement involving the ascending colon, hepatic flexure and transverse colon suspicious for colitis.   Review of Systems: As mentioned in the history of present illness. All other systems reviewed and are negative. Past Medical History:  Diagnosis Date   Allergy    Anemia    Anxiety    Arthritis    Asthma    Chronic lower back pain    Depression    Diabetes mellitus without complication (HCC)    Dyspnea    GERD (gastroesophageal reflux disease)    Glaucoma    Hyperlipemia    Hypertension    IIH (idiopathic intracranial hypertension)    Pseudotumor  cerebri 08/13/2019   Substance abuse (HCC)    Rx drugs for pain medication. Has not had in 5 years.   Past Surgical History:  Procedure Laterality Date   CESAREAN SECTION     x 3   CYSTOSCOPY Bilateral 11/25/2019   Procedure: CYSTOSCOPY;  Surgeon: Corene Coy, MD;  Location: [facility];  Service: Gynecology;  Laterality: Bilateral;   DERMOID CYST  EXCISION     fluid removed from brain  12/20/2018   ROBOTIC ASSISTED TOTAL HYSTERECTOMY Bilateral 11/25/2019   Procedure: XI ROBOTIC ASSISTED TOTAL HYSTERECTOMY WITH SALPINGECTOMY;  Surgeon: Corene Coy, MD;  Location: [facility];  Service: Gynecology;  Laterality: Bilateral;   Social History:  reports that she has been smoking cigarettes. She has never used smokeless tobacco. She reports current alcohol use. She reports that she does not use drugs.  Allergies[1]  Family History  Problem Relation Age of Onset   Diabetes Mother    Hypertension Mother    Cancer Father    Liver cancer Father    Colon cancer Neg Hx    Esophageal cancer Neg Hx    Rectal cancer Neg Hx    Stomach cancer Neg Hx    Pancreatic cancer Neg Hx     Prior to Admission medications  Medication Sig Start Date End Date Taking? Authorizing Provider  amLODipine  (NORVASC ) 10 MG tablet Take 1 tablet (10 mg total) by mouth daily. 08/24/23     Buprenorphine  HCl-Naloxone  HCl 8-2 MG FILM Place 1 film under tongue three times a day 01/23/24  cloNIDine  (CATAPRES ) 0.1 MG tablet Take 1 tablet (0.1 mg total) by mouth 3 (three) times daily. 03/12/24     dapagliflozin  propanediol (FARXIGA ) 10 MG TABS tablet Take 1 tablet (10 mg) by mouth every morning. Patient not taking: Reported on 08/25/2024 01/22/24     dapagliflozin  propanediol (FARXIGA ) 10 MG TABS tablet Take 1 tablet (10 mg) by mouth every morning. Patient not taking: Reported on 08/25/2024 01/30/24     DULoxetine  (CYMBALTA ) 60 MG capsule Take 1 capsule (60 mg total) by mouth 2 (two)  times daily for depression 01/08/24     fluticasone  (FLONASE ) 50 MCG/ACT nasal spray Place 1 spray into both nostrils at bedtime. 08/04/24     furosemide  (LASIX ) 20 MG tablet Take 1 tablet (20 mg) by mouth daily. 01/22/24     gabapentin  (NEURONTIN ) 800 MG tablet Take 1 tablet (800 mg total) by mouth 3 (three) times daily. 07/31/24     KLOXXADO  8 MG/0.1ML LIQD Place 1 spray into the nose as needed (opioid reversal). 03/20/24   [provider]  latanoprost  (XALATAN ) 0.005 % ophthalmic solution Place 1 drop into both eyes every evening. 10/15/23     omeprazole  (PRILOSEC) 40 MG capsule Take 1 capsule (40 mg total) by mouth at bedtime. 10/29/23     Prenatal Vit-Fe Fumarate-FA (WESTAB PLUS) 27-1 MG TABS Take 1 tablet by mouth daily.    [provider]  rosuvastatin  (CRESTOR ) 10 MG tablet Take 1 tablet (10 mg total) by mouth at bedtime  for cholesterol. 01/30/24     Semaglutide -Weight Management (WEGOVY ) 0.5 MG/0.5ML SOAJ Inject 0.5 mg into the skin once a week. 11/07/23       Physical Exam: Vitals:   08/25/24 0630 08/25/24 0714 08/25/24 0722 08/25/24 0815  BP: (!) 184/129 (!) 184/129  (!) 158/98  Pulse: (!) 101   93  Resp: 18   19  Temp:   97.6 F (36.4 C)   TempSrc:   Axillary   SpO2: 100%   100%  Weight:      Height:       General: Alert, oriented x3, resting comfortably in no acute distress Respiratory: Lungs clear to auscultation bilaterally with normal respiratory effort; no w/r/r Cardiovascular: Regular rate and rhythm w/o m/r/g Abdomen: Soft, nontender, nondistended. Positive bowel sounds   Data Reviewed:  Lab Results  Component Value Date   WBC 12.0 (H) 08/24/2024   HGB 20.9 (H) 08/24/2024   HCT 61.3 (H) 08/24/2024   MCV 90.0 08/24/2024   PLT 279 08/24/2024   Lab Results  Component Value Date   GLUCOSE 114 (H) 08/25/2024   CALCIUM  10.0 08/25/2024   NA 136 08/25/2024   K 3.5 08/25/2024   CO2 17 (L) 08/25/2024   CL 98 08/25/2024   BUN 22 (H) 08/25/2024    CREATININE 0.91 08/25/2024   Lab Results  Component Value Date   ALT 32 08/24/2024   AST 43 (H) 08/24/2024   ALKPHOS 187 (H) 08/24/2024   BILITOT 0.7 08/24/2024   No results found for: INR, PROTIME Radiology: DG Chest Portable 1 View Result Date: 08/25/2024 EXAM: 1 VIEW(S) XRAY OF THE CHEST 08/25/2024 03:16:45 AM COMPARISON: PA and lateral chest 08/16/2023. CLINICAL HISTORY: Fever and altered mental status. FINDINGS: LINES, TUBES AND DEVICES: Telemetry leads overlie the chest. LUNGS AND PLEURA: No focal pulmonary opacity. No pleural effusion. No pneumothorax. HEART AND MEDIASTINUM: No acute abnormality of the cardiac and mediastinal silhouettes. BONES AND SOFT TISSUES: No acute osseous abnormality. IMPRESSION: 1. No evidence of  acute chest disease. Electronically signed by: Francis Quam MD 08/25/2024 03:27 AM EST RP Workstation: [id number]   CT ABDOMEN PELVIS W CONTRAST Result Date: 08/25/2024 CLINICAL DATA:  Altered mental status EXAM: CT ABDOMEN AND PELVIS WITH CONTRAST TECHNIQUE: Multidetector CT imaging of the abdomen and pelvis was performed using the standard protocol following bolus administration of intravenous contrast. RADIATION DOSE REDUCTION: This exam was performed according to the departmental dose-optimization program which includes automated exposure control, adjustment of the mA and/or kV according to patient size and/or use of iterative reconstruction technique. CONTRAST:  75mL OMNIPAQUE  IOHEXOL  350 MG/ML SOLN COMPARISON:  CT 11/06/2021 FINDINGS: Lower chest: Lung bases demonstrate no acute airspace disease. Hepatobiliary: No focal liver abnormality is seen. No gallstones, gallbladder wall thickening, or biliary dilatation. Pancreas: Unremarkable. No pancreatic ductal dilatation or surrounding inflammatory changes. Spleen: Normal in size without focal abnormality. Adrenals/Urinary Tract: Adrenal glands are normal. Kidneys show no hydronephrosis. Right pelvic kidney. The bladder is  unremarkable. Stable small cyst in the left kidney, no imaging follow-up recommended. The bladder is unremarkable Stomach/Bowel: The stomach is nonenlarged. No dilated small bowel. Negative appendix. Wall thickening and mucosal enhancement involving the ascending colon, hepatic flexure and transverse colon. Vascular/Lymphatic: Aortic atherosclerosis. No enlarged abdominal or pelvic lymph nodes. Reproductive: Status post hysterectomy. No adnexal masses. Other: No ascites or free air Musculoskeletal: No acute or suspicious osseous abnormality IMPRESSION: 1. Wall thickening and mucosal enhancement involving the ascending colon, hepatic flexure and transverse colon suspicious for colitis. 2. Otherwise no CT evidence for acute intra-abdominal or pelvic abnormality 3. Aortic atherosclerosis. Aortic Atherosclerosis (ICD10-I70.0). Electronically Signed   By: Luke Bun M.D.   On: 08/25/2024 02:27   CT Head Wo Contrast Result Date: 08/24/2024 EXAM: CT HEAD WITHOUT 08/24/2024 11:41:22 PM TECHNIQUE: CT of the head was performed without the administration of intravenous contrast. Automated exposure control, iterative reconstruction, and/or weight based adjustment of the mA/kV was utilized to reduce the radiation dose to as low as reasonably achievable. COMPARISON: 03/28/2024 CLINICAL HISTORY: Altered mental status, nontraumatic (Ped 0-17y) FINDINGS: BRAIN AND VENTRICLES: No acute intracranial hemorrhage. No mass effect or midline shift. No extra-axial fluid collection. No evidence of acute infarct. No hydrocephalus. ORBITS: No acute abnormality. SINUSES AND MASTOIDS: No acute abnormality. SOFT TISSUES AND SKULL: No acute skull fracture. No acute soft tissue abnormality. IMPRESSION: 1. No acute intracranial abnormality. Electronically signed by: Franky Crease MD 08/24/2024 11:55 PM EST RP Workstation: [id number]    Assessment and Plan: 30F h/o essential hypertension, prediabetes, history of polysubstance abuse, bipolar  disorder, history of cocaine abuse, history of idiopathic intracranial hypertension, history of personality disorder history of attention deficit disorder, benzodiazepine abuse, and chronic pain syndrome who p/w intractable n/v and colitis on admission imaging.  Colitis on imaging Intractable n/v Elevated lactic acid Presumed dehydration -MIVF: NS at 150cc/h for now  -IV ciprofloxacin  400mg  daily for now -Senna/docusate BID and miralax  BID for now -F/u blood cultures  HTN -PTA amlodipine  10mg  daily  HLD -PTA Crestor  10mg  daily  Bipolar disorder -PTA duloxetine  60mg  BID  Chronic pain OUD -PTA suboxone  8-2mg  BID for now   Advance Care Planning:   Code Status: Full Code   Consults: N/A  Family Communication: Son  Severity of Illness: The appropriate patient status for this patient is INPATIENT. Inpatient status is judged to be reasonable and necessary in order to provide the required intensity of service to ensure the patient's safety. The patient's presenting symptoms, physical exam findings, and initial radiographic and laboratory data in  the context of their chronic comorbidities is felt to place them at high risk for further clinical deterioration. Furthermore, it is not anticipated that the patient will be medically stable for discharge from the hospital within 2 midnights of admission.   * I certify that at the point of admission it is my clinical judgment that the patient will require inpatient hospital care spanning beyond 2 midnights from the point of admission due to high intensity of service, high risk for further deterioration and high frequency of surveillance required.*   ------- I spent 55 minutes reviewing previous notes, at the bedside counseling/discussing the treatment plan, and performing clinical documentation.  Author: Marsha Ada, MD 08/25/2024 9:02 AM  For on call review [email or web address].      [1]  Allergies Allergen Reactions    Clindamycin/Lincomycin Nausea Only   Other Other (See Comments)    REFUSED BLOOD TRANSFUSION AND BLOOD PRODUCTS   Hydrocodone  Nausea And Vomiting and Other (See Comments)    stomach pain    Morphine Nausea And Vomiting   "

## 2024-08-25 NOTE — ED Notes (Signed)
 Pt urinated in floor. Pt redirected back to stretcher & brief changed.

## 2024-08-25 NOTE — ED Notes (Signed)
 Pt found out of bed, having taken off her gown and peed on the floor. Bed, floor, and pt cleaned. Pt redirected back into bed, resting at this time.

## 2024-08-25 NOTE — ED Notes (Signed)
 Pt refused phleb draw for cultures.

## 2024-08-25 NOTE — ED Notes (Signed)
 Found patient sitting at end of bed and had urinated all over self linens changed when we got patient back up in the bed she had 1 episode of vomiting.

## 2024-08-26 ENCOUNTER — Other Ambulatory Visit (HOSPITAL_COMMUNITY): Payer: Self-pay

## 2024-08-26 LAB — BASIC METABOLIC PANEL WITH GFR
Anion gap: 10 (ref 5–15)
BUN: 19 mg/dL (ref 6–20)
CO2: 22 mmol/L (ref 22–32)
Calcium: 8.7 mg/dL — ABNORMAL LOW (ref 8.9–10.3)
Chloride: 107 mmol/L (ref 98–111)
Creatinine, Ser: 0.85 mg/dL (ref 0.44–1.00)
GFR, Estimated: >60 mL/min
Glucose, Bld: 95 mg/dL (ref 70–99)
Potassium: 2.9 mmol/L — ABNORMAL LOW (ref 3.5–5.1)
Sodium: 138 mmol/L (ref 135–145)

## 2024-08-26 LAB — CBC
HCT: 46.2 % — ABNORMAL HIGH (ref 36.0–46.0)
Hemoglobin: 15.7 g/dL — ABNORMAL HIGH (ref 12.0–15.0)
MCH: 30.5 pg (ref 26.0–34.0)
MCHC: 34 g/dL (ref 30.0–36.0)
MCV: 89.9 fL (ref 80.0–100.0)
Platelets: 176 K/uL (ref 150–400)
RBC: 5.14 MIL/uL — ABNORMAL HIGH (ref 3.87–5.11)
RDW: 12.3 % (ref 11.5–15.5)
WBC: 9.5 K/uL (ref 4.0–10.5)
nRBC: 0 % (ref 0.0–0.2)

## 2024-08-26 LAB — POTASSIUM: Potassium: 3.4 mmol/L — ABNORMAL LOW (ref 3.5–5.1)

## 2024-08-26 MED ORDER — METRONIDAZOLE 500 MG PO TABS
500.0000 mg | ORAL_TABLET | Freq: Three times a day (TID) | ORAL | 0 refills | Status: AC
  Filled 2024-08-26: qty 9, 3d supply, fill #0

## 2024-08-26 MED ORDER — METRONIDAZOLE 500 MG PO TABS
500.0000 mg | ORAL_TABLET | Freq: Two times a day (BID) | ORAL | 0 refills | Status: AC
  Filled 2024-08-26 (×2): qty 6, 3d supply, fill #0

## 2024-08-26 MED ORDER — CIPROFLOXACIN IN D5W 400 MG/200ML IV SOLN: 400.0000 mg | Freq: Two times a day (BID) | INTRAVENOUS | Status: DC

## 2024-08-26 MED ORDER — CIPROFLOXACIN HCL 500 MG PO TABS
500.0000 mg | ORAL_TABLET | Freq: Two times a day (BID) | ORAL | 0 refills | Status: AC
  Filled 2024-08-26: qty 6, 3d supply, fill #0

## 2024-08-26 MED ORDER — CIPROFLOXACIN HCL 500 MG PO TABS
500.0000 mg | ORAL_TABLET | Freq: Two times a day (BID) | ORAL | Status: DC
  Filled 2024-08-26 (×2): qty 1

## 2024-08-26 MED ORDER — POTASSIUM CHLORIDE 10 MEQ/100ML IV SOLN
10.0000 meq | INTRAVENOUS | Status: AC
  Administered 2024-08-26 (×4): 10 meq via INTRAVENOUS
  Filled 2024-08-26 (×4): qty 100

## 2024-08-26 MED ORDER — CIPROFLOXACIN HCL 500 MG PO TABS
500.0000 mg | ORAL_TABLET | Freq: Two times a day (BID) | ORAL | 0 refills | Status: AC
  Filled 2024-08-26 (×2): qty 6, 3d supply, fill #0

## 2024-08-26 MED ORDER — METRONIDAZOLE 500 MG PO TABS
500.0000 mg | ORAL_TABLET | Freq: Two times a day (BID) | ORAL | Status: DC
  Administered 2024-08-26: 500 mg via ORAL
  Filled 2024-08-26: qty 1

## 2024-08-26 MED ORDER — METRONIDAZOLE 500 MG PO TABS: 500.0000 mg | ORAL_TABLET | Freq: Three times a day (TID) | ORAL | Status: DC

## 2024-08-26 NOTE — Progress Notes (Signed)
 " PROGRESS NOTE    Leah Barber  FMW:[id number] DOB: 04-06-1976 DOA: 08/24/2024 PCP: Maree Leni Edyth DELENA, MD  Subjective: Patient feels much better since admission.  She denied having any abdominal pain.  States she had 1 episode of of small-volume emesis.  She denied hematemesis.  She denied hematochezia or melena.  She denies a history of IBD including Crohn's or ulcerative colitis.  No family history of the same.  She is a smoker.  Patient states she was in jail prior to admission and had been off her Suboxone  for about 4 days.  Abdominal exam is completely benign.  She denies any recent antibiotic exposure.    Hospital Course: 08/26/24: Tolerating diet.  Denies further nausea emesis or abdominal pain.  No stools.  Denies hematochezia or melena.  On her Suboxone  feels great , wants to go home.   Assessment and Plan: No notes have been filed under this hospital service. Service: Hospitalist     DVT prophylaxis: enoxaparin  (LOVENOX ) injection 40 mg Start: 08/25/24 1000  Lovenox    Code Status: Full Code Family Communication: Discussed plans with the patient. Disposition Plan: Home this afternoon on a short course of Flagyl  and Cipro  given her fever.  Strongly doubt an inflammatory colitis.  This could represent ischemic colitis as she states she felt quite dehydrated. Reason for continuing need for hospitalization: Should be stable for discharge either tonight or tomorrow morning depending on how she tolerates diet.  Objective: Vitals:   08/25/24 1815 08/25/24 2041 08/26/24 0042 08/26/24 0430  BP: (!) 130/91 (!) 117/99 128/76 (!) 140/96  Pulse: (!) 117 99 83 71  Resp:      Temp: 98.3 F (36.8 C) 97.9 F (36.6 C) 97.7 F (36.5 C) 97.7 F (36.5 C)  TempSrc:    Oral  SpO2: 96% 96% 91% 99%  Weight:      Height:        Intake/Output Summary (Last 24 hours) at 08/26/2024 0740 Last data filed at 08/26/2024 0400 Gross per 24 hour  Intake 2419.62 ml  Output --  Net 2419.62 ml    Filed Weights   08/24/24 2321  Weight: 80.3 kg    Examination:  Physical Exam Constitutional:      Appearance: Normal appearance. She is not toxic-appearing.  HENT:     Head: Normocephalic and atraumatic.     Nose: Nose normal. No congestion.     Mouth/Throat:     Mouth: Mucous membranes are moist.     Pharynx: Oropharynx is clear.  Eyes:     General: No scleral icterus.    Extraocular Movements: Extraocular movements intact.     Pupils: Pupils are equal, round, and reactive to light.  Cardiovascular:     Rate and Rhythm: Normal rate and regular rhythm.     Heart sounds: No murmur heard.    No friction rub. No gallop.  Pulmonary:     Effort: Pulmonary effort is normal.     Breath sounds: No stridor. No wheezing, rhonchi or rales.  Abdominal:     General: Bowel sounds are normal. There is no distension.     Palpations: Abdomen is soft.     Tenderness: There is no abdominal tenderness. There is no guarding or rebound.  Musculoskeletal:     Cervical back: No rigidity or tenderness.     Left lower leg: No edema.  Skin:    Coloration: Skin is not jaundiced.  Neurological:     Mental Status: She is alert  and oriented to person, place, and time.     Cranial Nerves: No cranial nerve deficit.     Motor: No weakness.  Psychiatric:        Mood and Affect: Mood normal.     Data Reviewed: I have personally reviewed following labs and imaging studies  CBC: Recent Labs  Lab 08/24/24 2328 08/26/24 0507  WBC 12.0* 9.5  NEUTROABS 9.5*  --   HGB 20.9* 15.7*  HCT 61.3* 46.2*  MCV 90.0 89.9  PLT 279 176   Basic Metabolic Panel: Recent Labs  Lab 08/24/24 2328 08/25/24 0310 08/26/24 0507  NA 139 136 138  K 3.4* 3.5 2.9*  CL 95* 98 107  CO2 21* 17* 22  GLUCOSE 191* 114* 95  BUN 22* 22* 19  CREATININE 1.21* 0.91 0.85  CALCIUM  10.9* 10.0 8.7*   GFR: Estimated Creatinine Clearance: 77.8 mL/min (by C-G formula based on SCr of 0.85 mg/dL). Liver Function  Tests: Recent Labs  Lab 08/24/24 2328  AST 43*  ALT 32  ALKPHOS 187*  BILITOT 0.7  PROT 9.6*  ALBUMIN 5.1*   No results for input(s): LIPASE, AMYLASE in the last 168 hours. No results for input(s): AMMONIA in the last 168 hours. Coagulation Profile: No results for input(s): INR, PROTIME in the last 168 hours. Cardiac Enzymes: No results for input(s): CKTOTAL, CKMB, CKMBINDEX, TROPONINI in the last 168 hours. ProBNP, BNP (last 5 results) No results for input(s): PROBNP, BNP in the last 8760 hours. HbA1C: No results for input(s): HGBA1C in the last 72 hours. CBG: No results for input(s): GLUCAP in the last 168 hours. Lipid Profile: No results for input(s): CHOL, HDL, LDLCALC, TRIG, CHOLHDL, LDLDIRECT in the last 72 hours. Thyroid  Function Tests: No results for input(s): TSH, T4TOTAL, FREET4, T3FREE, THYROIDAB in the last 72 hours. Anemia Panel: No results for input(s): VITAMINB12, FOLATE, FERRITIN, TIBC, IRON, RETICCTPCT in the last 72 hours. Sepsis Labs: Recent Labs  Lab 08/24/24 2341 08/25/24 0315  LATICACIDVEN 4.6* 3.2*    Recent Results (from the past 240 hours)  Resp panel by RT-PCR (RSV, Flu A&B, Covid) Anterior Nasal Swab     Status: None   Collection Time: 08/25/24  2:53 AM   Specimen: Anterior Nasal Swab  Result Value Ref Range Status   SARS Coronavirus 2 by RT PCR NEGATIVE NEGATIVE Final   Influenza A by PCR NEGATIVE NEGATIVE Final   Influenza B by PCR NEGATIVE NEGATIVE Final    Comment: (NOTE) The Xpert Xpress SARS-CoV-2/FLU/RSV plus assay is intended as an aid in the diagnosis of influenza from Nasopharyngeal swab specimens and should not be used as a sole basis for treatment. Nasal washings and aspirates are unacceptable for Xpert Xpress SARS-CoV-2/FLU/RSV testing.  Fact Sheet for Patients: bloggercourse.com  Fact Sheet for Healthcare  Providers: seriousbroker.it  This test is not yet approved or cleared by the [place]  FDA and has been authorized for detection and/or diagnosis of SARS-CoV-2 by FDA under an Emergency Use Authorization (EUA). This EUA will remain in effect (meaning this test can be used) for the duration of the COVID-19 declaration under Section 564(b)(1) of the Act, 21 U.S.C. section 360bbb-3(b)(1), unless the authorization is terminated or revoked.     Resp Syncytial Virus by PCR NEGATIVE NEGATIVE Final    Comment: (NOTE) Fact Sheet for Patients: bloggercourse.com  Fact Sheet for Healthcare Providers: seriousbroker.it  This test is not yet approved or cleared by the [place]  FDA and has been authorized for detection and/or diagnosis  of SARS-CoV-2 by FDA under an Emergency Use Authorization (EUA). This EUA will remain in effect (meaning this test can be used) for the duration of the COVID-19 declaration under Section 564(b)(1) of the Act, 21 U.S.C. section 360bbb-3(b)(1), unless the authorization is terminated or revoked.  Performed at [facility], [address], [place], [place] [address]      Radiology Studies: DG Chest Portable 1 View Result Date: 08/25/2024 EXAM: 1 VIEW(S) XRAY OF THE CHEST 08/25/2024 03:16:45 AM COMPARISON: PA and lateral chest 08/16/2023. CLINICAL HISTORY: Fever and altered mental status. FINDINGS: LINES, TUBES AND DEVICES: Telemetry leads overlie the chest. LUNGS AND PLEURA: No focal pulmonary opacity. No pleural effusion. No pneumothorax. HEART AND MEDIASTINUM: No acute abnormality of the cardiac and mediastinal silhouettes. BONES AND SOFT TISSUES: No acute osseous abnormality. IMPRESSION: 1. No evidence of acute chest disease. Electronically signed by: Francis Quam MD 08/25/2024 03:27 AM EST RP Workstation: [id number]   CT ABDOMEN PELVIS W CONTRAST Result Date:  08/25/2024 CLINICAL DATA:  Altered mental status EXAM: CT ABDOMEN AND PELVIS WITH CONTRAST TECHNIQUE: Multidetector CT imaging of the abdomen and pelvis was performed using the standard protocol following bolus administration of intravenous contrast. RADIATION DOSE REDUCTION: This exam was performed according to the departmental dose-optimization program which includes automated exposure control, adjustment of the mA and/or kV according to patient size and/or use of iterative reconstruction technique. CONTRAST:  75mL OMNIPAQUE  IOHEXOL  350 MG/ML SOLN COMPARISON:  CT 11/06/2021 FINDINGS: Lower chest: Lung bases demonstrate no acute airspace disease. Hepatobiliary: No focal liver abnormality is seen. No gallstones, gallbladder wall thickening, or biliary dilatation. Pancreas: Unremarkable. No pancreatic ductal dilatation or surrounding inflammatory changes. Spleen: Normal in size without focal abnormality. Adrenals/Urinary Tract: Adrenal glands are normal. Kidneys show no hydronephrosis. Right pelvic kidney. The bladder is unremarkable. Stable small cyst in the left kidney, no imaging follow-up recommended. The bladder is unremarkable Stomach/Bowel: The stomach is nonenlarged. No dilated small bowel. Negative appendix. Wall thickening and mucosal enhancement involving the ascending colon, hepatic flexure and transverse colon. Vascular/Lymphatic: Aortic atherosclerosis. No enlarged abdominal or pelvic lymph nodes. Reproductive: Status post hysterectomy. No adnexal masses. Other: No ascites or free air Musculoskeletal: No acute or suspicious osseous abnormality IMPRESSION: 1. Wall thickening and mucosal enhancement involving the ascending colon, hepatic flexure and transverse colon suspicious for colitis. 2. Otherwise no CT evidence for acute intra-abdominal or pelvic abnormality 3. Aortic atherosclerosis. Aortic Atherosclerosis (ICD10-I70.0). Electronically Signed   By: Luke Bun M.D.   On: 08/25/2024 02:27   CT  Head Wo Contrast Result Date: 08/24/2024 EXAM: CT HEAD WITHOUT 08/24/2024 11:41:22 PM TECHNIQUE: CT of the head was performed without the administration of intravenous contrast. Automated exposure control, iterative reconstruction, and/or weight based adjustment of the mA/kV was utilized to reduce the radiation dose to as low as reasonably achievable. COMPARISON: 03/28/2024 CLINICAL HISTORY: Altered mental status, nontraumatic (Ped 0-17y) FINDINGS: BRAIN AND VENTRICLES: No acute intracranial hemorrhage. No mass effect or midline shift. No extra-axial fluid collection. No evidence of acute infarct. No hydrocephalus. ORBITS: No acute abnormality. SINUSES AND MASTOIDS: No acute abnormality. SOFT TISSUES AND SKULL: No acute skull fracture. No acute soft tissue abnormality. IMPRESSION: 1. No acute intracranial abnormality. Electronically signed by: Franky Crease MD 08/24/2024 11:55 PM EST RP Workstation: [id number]    Scheduled Meds:  amLODipine   10 mg Oral Daily   buprenorphine -naloxone   1 tablet Sublingual BID   DULoxetine   60 mg Oral BID   enoxaparin  (LOVENOX ) injection  40 mg Subcutaneous  Q24H   latanoprost   1 drop Both Eyes QPM   pantoprazole   40 mg Oral Daily   polyethylene glycol  17 g Oral BID   rosuvastatin   10 mg Oral QHS   senna-docusate  2 tablet Oral BID   Continuous Infusions:  sodium chloride  150 mL/hr at 08/26/24 0349   ciprofloxacin  400 mg (08/26/24 0611)     LOS: 1 day   Time spent: 30 minutes  Lonni KANDICE Moose, MD  [facility]  08/26/2024, 7:40 AM   "

## 2024-08-26 NOTE — Plan of Care (Signed)

## 2024-08-26 NOTE — Discharge Summary (Incomplete)
 "                                                                                                                                                                               Discharge summary note.  Leah Barber FMW:[id number] DOB: 1976/06/13 DOA: 08/24/2024  PCP: Maree Leni Edyth DELENA, MD  Admit date: 08/24/2024  Discharge date: 08/26/2024  Admitted From: [facility]   Disposition:  Home   Recommendations for Outpatient Follow-up:   Follow up with PCP in 1-2 weeks  PCP Please obtain BMP/CBC, 2 view CXR in 1week,  (see Discharge instructions)   PCP Please follow up on the following pending results: ***   Home Health: ***   Equipment/Devices: ***  Consultations: None *** Discharge Condition: Stable ***   CODE STATUS: Full ***   Diet Recommendation: Heart Healthy ***  Diet Order             Diet general           Diet regular Room service appropriate? Yes; Fluid consistency: Thin  Diet effective now                    Chief Complaint  Patient presents with   Altered Mental Status     Brief history of present illness from the day of admission and additional interim summary      ***                                                                 Hospital Course     *** Discharge diagnosis     Principal Problem:   Sepsis, due to unspecified organism, unspecified whether acute organ dysfunction present [facility])    Discharge instructions    Discharge Instructions     Call MD for:  persistant dizziness or light-headedness   Complete by: As directed    Call MD for:  persistant nausea and vomiting   Complete by: As directed    Call MD for:  severe uncontrolled pain   Complete by: As directed    Call MD for:  temperature >100.4   Complete by: As directed    Diet general   Complete by: As directed    Discharge instructions   Complete by: As directed    New prescriptions at your pharmacy.  Follow-up with your primary care provider the next 7 days.    Increase activity slowly   Complete by: As directed  Discharge Medications   Allergies as of 08/26/2024       Reactions   Clindamycin/lincomycin Nausea Only   Whole Blood Other (See Comments)   Refuses blood transfusions and all blood products   Hydrocodone  Nausea And Vomiting, Other (See Comments)   Abdominal pain, cramping   Ms Contin [morphine] Nausea And Vomiting        Medication List     TAKE these medications    acetaminophen  325 MG tablet Commonly known as: TYLENOL  Take 650 mg by mouth 3 (three) times daily as needed (pain).   amLODipine  10 MG tablet Commonly known as: NORVASC  Take 1 tablet (10 mg total) by mouth daily.   Buprenorphine  HCl-Naloxone  HCl 8-2 MG Film Place 1 film under tongue three times a day   ciprofloxacin  500 MG tablet Commonly known as: Cipro  Take 1 tablet (500 mg total) by mouth 2 (two) times daily for 3 days.   ciprofloxacin  500 MG tablet Commonly known as: CIPRO  Take 1 tablet (500 mg total) by mouth 2 (two) times daily for 3 days.   cloNIDine  0.1 MG tablet Commonly known as: CATAPRES  Take 1 tablet (0.1 mg total) by mouth 3 (three) times daily.   DULoxetine  60 MG capsule Commonly known as: CYMBALTA  Take 1 capsule (60 mg total) by mouth 2 (two) times daily for depression   Farxiga  10 MG Tabs tablet Generic drug: dapagliflozin  propanediol Take 1 tablet (10 mg) by mouth every morning.   Farxiga  10 MG Tabs tablet Generic drug: dapagliflozin  propanediol Take 1 tablet (10 mg) by mouth every morning.   fluticasone  50 MCG/ACT nasal spray Commonly known as: FLONASE  Place 1 spray into both nostrils at bedtime.   furosemide  20 MG tablet Commonly known as: LASIX  Take 1 tablet (20 mg) by mouth daily.   gabapentin  800 MG tablet Commonly known as: NEURONTIN  Take 1 tablet (800 mg total) by mouth 3 (three) times daily.   Kloxxado  8 MG/0.1ML Liqd Generic drug: Naloxone  HCl Place 1 spray into the nose as needed (opioid  reversal).   latanoprost  0.005 % ophthalmic solution Commonly known as: XALATAN  Place 1 drop into both eyes every evening.   loperamide 2 MG tablet Commonly known as: IMODIUM A-D Take 2 mg by mouth 3 (three) times daily as needed for diarrhea or loose stools.   meclizine 25 MG tablet Commonly known as: ANTIVERT Take 25 mg by mouth 3 (three) times daily as needed for dizziness.   metroNIDAZOLE  500 MG tablet Commonly known as: FLAGYL  Take 1 tablet (500 mg total) by mouth 3 (three) times daily for 3 days.   metroNIDAZOLE  500 MG tablet Commonly known as: FLAGYL  Take 1 tablet (500 mg total) by mouth every 12 (twelve) hours for 3 days.   omeprazole  40 MG capsule Commonly known as: PRILOSEC Take 1 capsule (40 mg total) by mouth at bedtime.   rosuvastatin  10 MG tablet Commonly known as: CRESTOR  Take 1 tablet (10 mg total) by mouth at bedtime  for cholesterol.   WesTab Plus 27-1 MG Tabs Take 1 tablet by mouth daily.          Major procedures and Radiology Reports - PLEASE review detailed and final reports thoroughly  -       *** DG Chest Portable 1 View Result Date: 08/25/2024 EXAM: 1 VIEW(S) XRAY OF THE CHEST 08/25/2024 03:16:45 AM COMPARISON: PA and lateral chest 08/16/2023. CLINICAL HISTORY: Fever and altered mental status. FINDINGS: LINES, TUBES AND DEVICES: Telemetry leads overlie the chest. LUNGS AND PLEURA: No  focal pulmonary opacity. No pleural effusion. No pneumothorax. HEART AND MEDIASTINUM: No acute abnormality of the cardiac and mediastinal silhouettes. BONES AND SOFT TISSUES: No acute osseous abnormality. IMPRESSION: 1. No evidence of acute chest disease. Electronically signed by: Francis Quam MD 08/25/2024 03:27 AM EST RP Workstation: [id number]   CT ABDOMEN PELVIS W CONTRAST Result Date: 08/25/2024 CLINICAL DATA:  Altered mental status EXAM: CT ABDOMEN AND PELVIS WITH CONTRAST TECHNIQUE: Multidetector CT imaging of the abdomen and pelvis was performed using the  standard protocol following bolus administration of intravenous contrast. RADIATION DOSE REDUCTION: This exam was performed according to the departmental dose-optimization program which includes automated exposure control, adjustment of the mA and/or kV according to patient size and/or use of iterative reconstruction technique. CONTRAST:  75mL OMNIPAQUE  IOHEXOL  350 MG/ML SOLN COMPARISON:  CT 11/06/2021 FINDINGS: Lower chest: Lung bases demonstrate no acute airspace disease. Hepatobiliary: No focal liver abnormality is seen. No gallstones, gallbladder wall thickening, or biliary dilatation. Pancreas: Unremarkable. No pancreatic ductal dilatation or surrounding inflammatory changes. Spleen: Normal in size without focal abnormality. Adrenals/Urinary Tract: Adrenal glands are normal. Kidneys show no hydronephrosis. Right pelvic kidney. The bladder is unremarkable. Stable small cyst in the left kidney, no imaging follow-up recommended. The bladder is unremarkable Stomach/Bowel: The stomach is nonenlarged. No dilated small bowel. Negative appendix. Wall thickening and mucosal enhancement involving the ascending colon, hepatic flexure and transverse colon. Vascular/Lymphatic: Aortic atherosclerosis. No enlarged abdominal or pelvic lymph nodes. Reproductive: Status post hysterectomy. No adnexal masses. Other: No ascites or free air Musculoskeletal: No acute or suspicious osseous abnormality IMPRESSION: 1. Wall thickening and mucosal enhancement involving the ascending colon, hepatic flexure and transverse colon suspicious for colitis. 2. Otherwise no CT evidence for acute intra-abdominal or pelvic abnormality 3. Aortic atherosclerosis. Aortic Atherosclerosis (ICD10-I70.0). Electronically Signed   By: Luke Bun M.D.   On: 08/25/2024 02:27   CT Head Wo Contrast Result Date: 08/24/2024 EXAM: CT HEAD WITHOUT 08/24/2024 11:41:22 PM TECHNIQUE: CT of the head was performed without the administration of intravenous contrast.  Automated exposure control, iterative reconstruction, and/or weight based adjustment of the mA/kV was utilized to reduce the radiation dose to as low as reasonably achievable. COMPARISON: 03/28/2024 CLINICAL HISTORY: Altered mental status, nontraumatic (Ped 0-17y) FINDINGS: BRAIN AND VENTRICLES: No acute intracranial hemorrhage. No mass effect or midline shift. No extra-axial fluid collection. No evidence of acute infarct. No hydrocephalus. ORBITS: No acute abnormality. SINUSES AND MASTOIDS: No acute abnormality. SOFT TISSUES AND SKULL: No acute skull fracture. No acute soft tissue abnormality. IMPRESSION: 1. No acute intracranial abnormality. Electronically signed by: Franky Crease MD 08/24/2024 11:55 PM EST RP Workstation: [id number]    Micro Results   *** Recent Results (from the past 240 hours)  Resp panel by RT-PCR (RSV, Flu A&B, Covid) Anterior Nasal Swab     Status: None   Collection Time: 08/25/24  2:53 AM   Specimen: Anterior Nasal Swab  Result Value Ref Range Status   SARS Coronavirus 2 by RT PCR NEGATIVE NEGATIVE Final   Influenza A by PCR NEGATIVE NEGATIVE Final   Influenza B by PCR NEGATIVE NEGATIVE Final    Comment: (NOTE) The Xpert Xpress SARS-CoV-2/FLU/RSV plus assay is intended as an aid in the diagnosis of influenza from Nasopharyngeal swab specimens and should not be used as a sole basis for treatment. Nasal washings and aspirates are unacceptable for Xpert Xpress SARS-CoV-2/FLU/RSV testing.  Fact Sheet for Patients: bloggercourse.com  Fact Sheet for Healthcare Providers: seriousbroker.it  This test is not yet approved  or cleared by the [place]  FDA and has been authorized for detection and/or diagnosis of SARS-CoV-2 by FDA under an Emergency Use Authorization (EUA). This EUA will remain in effect (meaning this test can be used) for the duration of the COVID-19 declaration under Section 564(b)(1) of the Act, 21  U.S.C. section 360bbb-3(b)(1), unless the authorization is terminated or revoked.     Resp Syncytial Virus by PCR NEGATIVE NEGATIVE Final    Comment: (NOTE) Fact Sheet for Patients: bloggercourse.com  Fact Sheet for Healthcare Providers: seriousbroker.it  This test is not yet approved or cleared by the [place]  FDA and has been authorized for detection and/or diagnosis of SARS-CoV-2 by FDA under an Emergency Use Authorization (EUA). This EUA will remain in effect (meaning this test can be used) for the duration of the COVID-19 declaration under Section 564(b)(1) of the Act, 21 U.S.C. section 360bbb-3(b)(1), unless the authorization is terminated or revoked.  Performed at [facility], [address] [place], [place] [address]   Culture, blood (Routine X 2) w Reflex to ID Panel     Status: None (Preliminary result)   Collection Time: 08/25/24  1:00 PM   Specimen: BLOOD  Result Value Ref Range Status   Specimen Description BLOOD LEFT ANTECUBITAL  Final   Special Requests   Final    BOTTLES DRAWN AEROBIC AND ANAEROBIC Blood Culture results may not be optimal due to an inadequate volume of blood received in culture bottles   Culture   Final    NO GROWTH < 24 HOURS Performed at [facility], [address] [place], [place] [address]    Report Status PENDING  Incomplete  Culture, blood (Routine X 2) w Reflex to ID Panel     Status: None (Preliminary result)   Collection Time: 08/25/24  6:29 PM   Specimen: BLOOD  Result Value Ref Range Status   Specimen Description BLOOD SITE NOT SPECIFIED  Final   Special Requests   Final    BOTTLES DRAWN AEROBIC AND ANAEROBIC Blood Culture results may not be optimal due to an inadequate volume of blood received in culture bottles   Culture   Final    NO GROWTH < 24 HOURS Performed at [facility], [address], [place], [place] [address]    Report Status PENDING   Incomplete    Today   Subjective    Leah Barber today has no headache,no chest abdominal pain,no new weakness tingling or numbness, feels much better wants to go home today. ***   Objective   Blood pressure 119/89, pulse 88, temperature 98.6 F (37 C), temperature source Oral, resp. rate 17, height 5' 2 (1.575 m), weight 80.3 kg, last menstrual period 09/14/2019, SpO2 99%.   Intake/Output Summary (Last 24 hours) at 08/26/2024 1702 Last data filed at 08/26/2024 1617 Gross per 24 hour  Intake 3720 ml  Output --  Net 3720 ml    Exam  Awake Alert, No new F.N deficits,    [place].AT,PERRAL Supple Neck,   Symmetrical Chest wall movement, Good air movement bilaterally, CTAB RRR,No Gallops,   +ve B.Sounds, Abd Soft, Non tender,  No Cyanosis, Clubbing or edema    Data Review   Recent Labs  Lab 08/24/24 2328 08/26/24 0507  WBC 12.0* 9.5  HGB 20.9* 15.7*  HCT 61.3* 46.2*  PLT 279 176  MCV 90.0 89.9  MCH 30.7 30.5  MCHC 34.1 34.0  RDW 12.1 12.3  LYMPHSABS 1.9  --  MONOABS 0.5  --   EOSABS 0.0  --   BASOSABS 0.1  --     Recent Labs  Lab 08/24/24 2328 08/24/24 2341 08/25/24 0310 08/25/24 0315 08/26/24 0507 08/26/24 1506  NA 139  --  136  --  138  --   K 3.4*  --  3.5  --  2.9* 3.4*  CL 95*  --  98  --  107  --   CO2 21*  --  17*  --  22  --   ANIONGAP 23*  --  21*  --  10  --   GLUCOSE 191*  --  114*  --  95  --   BUN 22*  --  22*  --  19  --   CREATININE 1.21*  --  0.91  --  0.85  --   AST 43*  --   --   --   --   --   ALT 32  --   --   --   --   --   ALKPHOS 187*  --   --   --   --   --   BILITOT 0.7  --   --   --   --   --   ALBUMIN 5.1*  --   --   --   --   --   LATICACIDVEN  --  4.6*  --  3.2*  --   --   CALCIUM  10.9*  --  10.0  --  8.7*  --     Total Time in preparing paper work, data evaluation and todays exam - 35 minutes  Signature  -    Leah Barber M.D on 08/26/2024 at 5:02 PM   -  To page go to [email or web address]      08/26/2024 "

## 2024-08-26 NOTE — Progress Notes (Signed)
 PHARMACY NOTE:  ANTIMICROBIAL RENAL DOSAGE ADJUSTMENT  Current antimicrobial regimen includes a mismatch between antimicrobial dosage and estimated renal function.  As per policy approved by the Pharmacy & Therapeutics and Medical Executive Committees, the antimicrobial dosage will be adjusted accordingly.  Current antimicrobial dosage:  Cipro  400mg  IV Q24hr  Indication: Colitis  Renal Function:  Estimated Creatinine Clearance: 77.8 mL/min (by C-G formula based on SCr of 0.85 mg/dL). []      On intermittent HD, scheduled: []      On CRRT    Antimicrobial dosage has been changed to:  Cipro  400mg  IV Q12hr  Additional comments:   Thank you for allowing pharmacy to be a part of this patient's care.  Prentice Poisson, PharmD Clinical Pharmacist **Pharmacist phone directory can now be found on [email or web address] ([email or web address]).  Listed under [facility].

## 2024-08-26 NOTE — Plan of Care (Signed)

## 2024-08-26 NOTE — Progress Notes (Signed)
 Transition of Care [facility]) - Inpatient Brief Assessment   Patient Details  Name: Leah Barber MRN: [id number] Date of Birth: [birth year]  [facility] CM/SW Contact:    Rosaline JONELLE Joe, RN Phone Number: 08/26/2024, 3:20 PM   Clinical Narrative: Patient admitted for N/V and colitis.  Per MD note - patient may likely discharge today or tomorrow when stable and tolerating diet.  No IP Care management needs at this time.   Transition of Care Asessment: Insurance and Status: (P) Insurance coverage has been reviewed Patient has primary care physician: (P) Yes Home environment has been reviewed: (P) from home Prior level of function:: (P) self Prior/Current Home Services: (P) No current home services Social Drivers of Health Review: (P) SDOH reviewed no interventions necessary Readmission risk has been reviewed: (P) Yes Transition of care needs: (P) no transition of care needs at this time

## 2024-08-27 ENCOUNTER — Other Ambulatory Visit (HOSPITAL_COMMUNITY): Payer: Self-pay

## 2024-08-27 DIAGNOSIS — E860 Dehydration: Secondary | ICD-10-CM

## 2024-08-27 DIAGNOSIS — K529 Noninfective gastroenteritis and colitis, unspecified: Secondary | ICD-10-CM

## 2024-08-27 NOTE — Discharge Summary (Signed)
 "                                                                                                                                                                               Discharge summary note.  Leah Barber FMW:[id number] DOB: 05/05/1976 DOA: 08/24/2024  PCP: Maree Leni Edyth DELENA, MD  Admit date: 08/24/2024  Discharge date: 08/27/2024  Admitted From: [facility] ER   Disposition:  Home   Recommendations for Outpatient Follow-up:   Follow up with PCP in 1-2 weeks  PCP Please obtain BMP/CBC, 2 view CXR in 1week,  (see Discharge instructions)   PCP Please follow up on the following pending results: Blood Cultures  from 08/25/2024 ( no growth to date)   Home Health:   Equipment/Devices:   Consultations: None  Discharge Condition: Stable   CODE STATUS: Full    Diet Recommendation: Heart Healthy   Diet Order             Diet general                    Chief Complaint  Patient presents with   Altered Mental Status     Brief history of present illness from the day of admission and additional interim summary       HPI: Leah Barber is a 49 y.o. female with medical history significant of essential hypertension, prediabetes, history of polysubstance abuse, bipolar disorder, history of cocaine abuse, history of idiopathic intracranial hypertension, history of personality disorder history of attention deficit disorder, benzodiazepine abuse, and chronic pain syndrome who p/w intractable n/v and colitis on admission imaging.   The patient presented to the hospital with symptoms of dehydration 2/2 intractable n/v. The patient reported running out of medications, specifically Suboxone , which the patient usually took at a dose of 8 mg three times a day. Upon admission, the patient was found to have a positive urine test for cocaine and fentanyl . The patient reported using these substances about a week ago but did not use them regularly.   In t, pt febrile to 100.6 x1, hypertensive,  tachycardic, and tachypneic w/o hypoxia. Labs notable for Cr 1.21>0.9, WBC 12, and lactic acid 4.6>3.2. UDS positive for cocaine and fentanyl . CTA abdomen showed wall thickening and mucosal enhancement involving the ascending colon, hepatic flexure and transverse colon suspicious for colitis.  Hospital Course   Patient was admitted with dehydration and intractable nausea and vomiting.  She had run out of Suboxone .  Patient underwent CT of the abdomen suspicious for colitis.  Patient was hydrated with resolution of her symptoms.  On the date of discharge she had absolutely no abdominal pain.  There was no abdominal tenderness.  She had no diarrhea hematochezia or melena.  She was tolerating a diet.  She was prescribed a 3-day course of Flagyl  and ciprofloxacin  orally.  She was advised to abstain from illicit substances.  Patient had been in jail just prior to admission and had been off her Suboxone  for 4 days.  Once she was started back on suboxone  her symptoms rapidly improved.  She will follow-up with her prescriber.  Some concern she may have had an ischemic colitis related to hypotension and cocaine use.  She was hypokalemic and that was improved at the time of discharge.  Her anion gap was corrected.  Was advised to follow-up with her PCP concerning pending blood cultures.  At time of discharge she was afebrile hemodynamically stable with resolved symptoms. Discharge diagnosis     Principal Problem:   Sepsis, due to unspecified organism, unspecified whether acute organ dysfunction present [facility])    Discharge instructions    Discharge Instructions     Call MD for:  persistant dizziness or light-headedness   Complete by: As directed    Call MD for:  persistant nausea and vomiting   Complete by: As directed    Call MD for:  severe uncontrolled pain   Complete by: As directed    Call MD for:  temperature >100.4   Complete by: As  directed    Diet general   Complete by: As directed    Discharge instructions   Complete by: As directed    New prescriptions at your pharmacy.  Follow-up with your primary care provider the next 7 days.   Increase activity slowly   Complete by: As directed        Discharge Medications   Allergies as of 08/26/2024       Reactions   Clindamycin/lincomycin Nausea Only   Whole Blood Other (See Comments)   Refuses blood transfusions and all blood products   Hydrocodone  Nausea And Vomiting, Other (See Comments)   Abdominal pain, cramping   Ms Contin [morphine] Nausea And Vomiting        Medication List     TAKE these medications    acetaminophen  325 MG tablet Commonly known as: TYLENOL  Take 650 mg by mouth 3 (three) times daily as needed (pain).   amLODipine  10 MG tablet Commonly known as: NORVASC  Take 1 tablet (10 mg total) by mouth daily.   Buprenorphine  HCl-Naloxone  HCl 8-2 MG Film Place 1 film under tongue three times a day   ciprofloxacin  500 MG tablet Commonly known as: Cipro  Take 1 tablet (500 mg total) by mouth 2 (two) times daily for 3 days.   ciprofloxacin  500 MG tablet Commonly known as: CIPRO  Take 1 tablet (500 mg total) by mouth 2 (two) times daily for 3 days.   cloNIDine  0.1 MG tablet Commonly known as: CATAPRES  Take 1 tablet (0.1 mg total) by mouth 3 (three) times daily.   DULoxetine  60 MG capsule Commonly known as: CYMBALTA  Take 1 capsule (60 mg total) by mouth 2 (two) times daily for depression   Farxiga  10 MG Tabs tablet Generic drug: dapagliflozin  propanediol Take 1 tablet (10 mg) by mouth every morning.  Farxiga  10 MG Tabs tablet Generic drug: dapagliflozin  propanediol Take 1 tablet (10 mg) by mouth every morning.   fluticasone  50 MCG/ACT nasal spray Commonly known as: FLONASE  Place 1 spray into both nostrils at bedtime.   furosemide  20 MG tablet Commonly known as: LASIX  Take 1 tablet (20 mg) by mouth daily.   gabapentin  800 MG  tablet Commonly known as: NEURONTIN  Take 1 tablet (800 mg total) by mouth 3 (three) times daily.   Kloxxado  8 MG/0.1ML Liqd Generic drug: Naloxone  HCl Place 1 spray into the nose as needed (opioid reversal).   latanoprost  0.005 % ophthalmic solution Commonly known as: XALATAN  Place 1 drop into both eyes every evening.   loperamide 2 MG tablet Commonly known as: IMODIUM A-D Take 2 mg by mouth 3 (three) times daily as needed for diarrhea or loose stools.   meclizine 25 MG tablet Commonly known as: ANTIVERT Take 25 mg by mouth 3 (three) times daily as needed for dizziness.   metroNIDAZOLE  500 MG tablet Commonly known as: FLAGYL  Take 1 tablet (500 mg total) by mouth 3 (three) times daily for 3 days.   metroNIDAZOLE  500 MG tablet Commonly known as: FLAGYL  Take 1 tablet (500 mg total) by mouth every 12 (twelve) hours for 3 days.   omeprazole  40 MG capsule Commonly known as: PRILOSEC Take 1 capsule (40 mg total) by mouth at bedtime.   rosuvastatin  10 MG tablet Commonly known as: CRESTOR  Take 1 tablet (10 mg total) by mouth at bedtime  for cholesterol.   WesTab Plus 27-1 MG Tabs Take 1 tablet by mouth daily.          Major procedures and Radiology Reports - PLEASE review detailed and final reports thoroughly  -        DG Chest Portable 1 View Result Date: 08/25/2024 EXAM: 1 VIEW(S) XRAY OF THE CHEST 08/25/2024 03:16:45 AM COMPARISON: PA and lateral chest 08/16/2023. CLINICAL HISTORY: Fever and altered mental status. FINDINGS: LINES, TUBES AND DEVICES: Telemetry leads overlie the chest. LUNGS AND PLEURA: No focal pulmonary opacity. No pleural effusion. No pneumothorax. HEART AND MEDIASTINUM: No acute abnormality of the cardiac and mediastinal silhouettes. BONES AND SOFT TISSUES: No acute osseous abnormality. IMPRESSION: 1. No evidence of acute chest disease. Electronically signed by: Francis Quam MD 08/25/2024 03:27 AM EST RP Workstation: [id number]   CT ABDOMEN PELVIS W  CONTRAST Result Date: 08/25/2024 CLINICAL DATA:  Altered mental status EXAM: CT ABDOMEN AND PELVIS WITH CONTRAST TECHNIQUE: Multidetector CT imaging of the abdomen and pelvis was performed using the standard protocol following bolus administration of intravenous contrast. RADIATION DOSE REDUCTION: This exam was performed according to the departmental dose-optimization program which includes automated exposure control, adjustment of the mA and/or kV according to patient size and/or use of iterative reconstruction technique. CONTRAST:  75mL OMNIPAQUE  IOHEXOL  350 MG/ML SOLN COMPARISON:  CT 11/06/2021 FINDINGS: Lower chest: Lung bases demonstrate no acute airspace disease. Hepatobiliary: No focal liver abnormality is seen. No gallstones, gallbladder wall thickening, or biliary dilatation. Pancreas: Unremarkable. No pancreatic ductal dilatation or surrounding inflammatory changes. Spleen: Normal in size without focal abnormality. Adrenals/Urinary Tract: Adrenal glands are normal. Kidneys show no hydronephrosis. Right pelvic kidney. The bladder is unremarkable. Stable small cyst in the left kidney, no imaging follow-up recommended. The bladder is unremarkable Stomach/Bowel: The stomach is nonenlarged. No dilated small bowel. Negative appendix. Wall thickening and mucosal enhancement involving the ascending colon, hepatic flexure and transverse colon. Vascular/Lymphatic: Aortic atherosclerosis. No enlarged abdominal or pelvic lymph nodes. Reproductive: Status  post hysterectomy. No adnexal masses. Other: No ascites or free air Musculoskeletal: No acute or suspicious osseous abnormality IMPRESSION: 1. Wall thickening and mucosal enhancement involving the ascending colon, hepatic flexure and transverse colon suspicious for colitis. 2. Otherwise no CT evidence for acute intra-abdominal or pelvic abnormality 3. Aortic atherosclerosis. Aortic Atherosclerosis (ICD10-I70.0). Electronically Signed   By: Luke Bun M.D.   On:  08/25/2024 02:27   CT Head Wo Contrast Result Date: 08/24/2024 EXAM: CT HEAD WITHOUT 08/24/2024 11:41:22 PM TECHNIQUE: CT of the head was performed without the administration of intravenous contrast. Automated exposure control, iterative reconstruction, and/or weight based adjustment of the mA/kV was utilized to reduce the radiation dose to as low as reasonably achievable. COMPARISON: 03/28/2024 CLINICAL HISTORY: Altered mental status, nontraumatic (Ped 0-17y) FINDINGS: BRAIN AND VENTRICLES: No acute intracranial hemorrhage. No mass effect or midline shift. No extra-axial fluid collection. No evidence of acute infarct. No hydrocephalus. ORBITS: No acute abnormality. SINUSES AND MASTOIDS: No acute abnormality. SOFT TISSUES AND SKULL: No acute skull fracture. No acute soft tissue abnormality. IMPRESSION: 1. No acute intracranial abnormality. Electronically signed by: Franky Crease MD 08/24/2024 11:55 PM EST RP Workstation: [id number]    Micro Results   Blood cultures negative to date respiratory panel PCR flu COVID RSV all negative HIV negative Recent Results (from the past 240 hours)  Resp panel by RT-PCR (RSV, Flu A&B, Covid) Anterior Nasal Swab     Status: None   Collection Time: 08/25/24  2:53 AM   Specimen: Anterior Nasal Swab  Result Value Ref Range Status   SARS Coronavirus 2 by RT PCR NEGATIVE NEGATIVE Final   Influenza A by PCR NEGATIVE NEGATIVE Final   Influenza B by PCR NEGATIVE NEGATIVE Final    Comment: (NOTE) The Xpert Xpress SARS-CoV-2/FLU/RSV plus assay is intended as an aid in the diagnosis of influenza from Nasopharyngeal swab specimens and should not be used as a sole basis for treatment. Nasal washings and aspirates are unacceptable for Xpert Xpress SARS-CoV-2/FLU/RSV testing.  Fact Sheet for Patients: bloggercourse.com  Fact Sheet for Healthcare Providers: seriousbroker.it  This test is not yet approved or cleared by the  [place]  FDA and has been authorized for detection and/or diagnosis of SARS-CoV-2 by FDA under an Emergency Use Authorization (EUA). This EUA will remain in effect (meaning this test can be used) for the duration of the COVID-19 declaration under Section 564(b)(1) of the Act, 21 U.S.C. section 360bbb-3(b)(1), unless the authorization is terminated or revoked.     Resp Syncytial Virus by PCR NEGATIVE NEGATIVE Final    Comment: (NOTE) Fact Sheet for Patients: bloggercourse.com  Fact Sheet for Healthcare Providers: seriousbroker.it  This test is not yet approved or cleared by the [place]  FDA and has been authorized for detection and/or diagnosis of SARS-CoV-2 by FDA under an Emergency Use Authorization (EUA). This EUA will remain in effect (meaning this test can be used) for the duration of the COVID-19 declaration under Section 564(b)(1) of the Act, 21 U.S.C. section 360bbb-3(b)(1), unless the authorization is terminated or revoked.  Performed at [facility], [address], [place], [place] [address]   Culture, blood (Routine X 2) w Reflex to ID Panel     Status: None (Preliminary result)   Collection Time: 08/25/24  1:00 PM   Specimen: BLOOD  Result Value Ref Range Status   Specimen Description BLOOD LEFT ANTECUBITAL  Final   Special Requests   Final    BOTTLES DRAWN AEROBIC AND ANAEROBIC Blood Culture  results may not be optimal due to an inadequate volume of blood received in culture bottles   Culture   Final    NO GROWTH 2 DAYS Performed at [facility], [address] [place], [place] [address]    Report Status PENDING  Incomplete  Culture, blood (Routine X 2) w Reflex to ID Panel     Status: None (Preliminary result)   Collection Time: 08/25/24  6:29 PM   Specimen: BLOOD  Result Value Ref Range Status   Specimen Description BLOOD SITE NOT SPECIFIED  Final   Special Requests   Final    BOTTLES  DRAWN AEROBIC AND ANAEROBIC Blood Culture results may not be optimal due to an inadequate volume of blood received in culture bottles   Culture   Final    NO GROWTH 2 DAYS Performed at [facility], [address], [place], [place] [address]    Report Status PENDING  Incomplete    Today   Subjective    Leah Barber today has no headache,no chest abdominal pain,no new weakness tingling or numbness, feels much better wants to go home today.  Patient denies further nausea vomiting or diarrhea.  No hematemesis, hematochezia or melena.  To advance her diet.   Objective   Blood pressure 119/89, pulse 88, temperature 98.6 F (37 C), temperature source Oral, resp. rate 17, height 5' 2 (1.575 m), weight 80.3 kg, last menstrual period 09/14/2019, SpO2 99%.  No intake or output data in the 24 hours ending 08/27/24 1621  Exam  Awake Alert, No new F.N deficits,    [place].AT,PERRAL Supple Neck,   Symmetrical Chest wall movement, Good air movement bilaterally, CTAB RRR,No Gallops,   +ve B.Sounds, Abd Soft, Non tender,  No Cyanosis, Clubbing or edema    Data Review   Recent Labs  Lab 08/24/24 2328 08/26/24 0507  WBC 12.0* 9.5  HGB 20.9* 15.7*  HCT 61.3* 46.2*  PLT 279 176  MCV 90.0 89.9  MCH 30.7 30.5  MCHC 34.1 34.0  RDW 12.1 12.3  LYMPHSABS 1.9  --   MONOABS 0.5  --   EOSABS 0.0  --   BASOSABS 0.1  --     Recent Labs  Lab 08/24/24 2328 08/24/24 2341 08/25/24 0310 08/25/24 0315 08/26/24 0507 08/26/24 1506  NA 139  --  136  --  138  --   K 3.4*  --  3.5  --  2.9* 3.4*  CL 95*  --  98  --  107  --   CO2 21*  --  17*  --  22  --   ANIONGAP 23*  --  21*  --  10  --   GLUCOSE 191*  --  114*  --  95  --   BUN 22*  --  22*  --  19  --   CREATININE 1.21*  --  0.91  --  0.85  --   AST 43*  --   --   --   --   --   ALT 32  --   --   --   --   --   ALKPHOS 187*  --   --   --   --   --   BILITOT 0.7  --   --   --   --   --   ALBUMIN 5.1*  --   --   --   --   --    LATICACIDVEN  --  4.6*  --  3.2*  --   --   CALCIUM  10.9*  --  10.0  --  8.7*  --     Total Time in preparing paper work, data evaluation and todays exam - 35 minutes  Signature  -    Lonni KANDICE Moose M.D on 08/27/2024 at 4:21 PM   -  To page go to [email or web address]      "

## 2024-08-30 LAB — CULTURE, BLOOD (ROUTINE X 2)
Culture: NO GROWTH
Culture: NO GROWTH

## 2024-09-04 ENCOUNTER — Other Ambulatory Visit (HOSPITAL_COMMUNITY): Payer: Self-pay

## 2024-09-04 MED ORDER — ONDANSETRON HCL 4 MG PO TABS
4.0000 mg | ORAL_TABLET | Freq: Two times a day (BID) | ORAL | 0 refills | Status: AC
  Filled 2024-09-04: qty 60, 30d supply, fill #0

## 2024-09-06 ENCOUNTER — Other Ambulatory Visit (HOSPITAL_COMMUNITY): Payer: Self-pay

## 2024-09-06 MED ORDER — FLUCONAZOLE 150 MG PO TABS
150.0000 mg | ORAL_TABLET | Freq: Once | ORAL | 0 refills | Status: AC
  Filled 2024-09-06: qty 1, 1d supply, fill #0

## 2024-09-10 ENCOUNTER — Other Ambulatory Visit (HOSPITAL_COMMUNITY): Payer: Self-pay

## 2024-09-10 MED ORDER — FLUTICASONE PROPIONATE 50 MCG/ACT NA SUSP
1.0000 | Freq: Every day | NASAL | 0 refills | Status: AC
  Filled 2024-09-10: qty 16, 60d supply, fill #0

## 2024-09-11 ENCOUNTER — Other Ambulatory Visit: Payer: Self-pay | Admitting: Family Medicine

## 2024-09-11 DIAGNOSIS — M7989 Other specified soft tissue disorders: Secondary | ICD-10-CM

## 2024-09-11 DIAGNOSIS — R2232 Localized swelling, mass and lump, left upper limb: Secondary | ICD-10-CM

## 2024-09-17 ENCOUNTER — Other Ambulatory Visit

## 2024-09-18 ENCOUNTER — Other Ambulatory Visit: Payer: Self-pay

## 2024-09-18 ENCOUNTER — Other Ambulatory Visit (HOSPITAL_COMMUNITY): Payer: Self-pay

## 2024-09-18 MED ORDER — AMLODIPINE BESYLATE 10 MG PO TABS
10.0000 mg | ORAL_TABLET | Freq: Every day | ORAL | 2 refills | Status: AC
  Filled 2024-09-18: qty 90, 90d supply, fill #0

## 2024-09-19 ENCOUNTER — Other Ambulatory Visit

## 2024-09-22 ENCOUNTER — Other Ambulatory Visit (HOSPITAL_COMMUNITY): Payer: Self-pay

## 2024-09-23 ENCOUNTER — Other Ambulatory Visit (HOSPITAL_COMMUNITY): Payer: Self-pay

## 2024-09-25 ENCOUNTER — Other Ambulatory Visit

## 2024-09-26 ENCOUNTER — Other Ambulatory Visit
